# Patient Record
Sex: Male | Born: 1955
Health system: Southern US, Community
[De-identification: ages and names within clinical notes are randomized; demographics above are authoritative.]

## PROBLEM LIST (undated history)

## (undated) ENCOUNTER — Emergency Department (HOSPITAL_COMMUNITY): Payer: Medicare Other | Source: Home / Self Care

## (undated) DIAGNOSIS — J329 Chronic sinusitis, unspecified: Secondary | ICD-10-CM

## (undated) DIAGNOSIS — F419 Anxiety disorder, unspecified: Secondary | ICD-10-CM

## (undated) DIAGNOSIS — K219 Gastro-esophageal reflux disease without esophagitis: Secondary | ICD-10-CM

## (undated) DIAGNOSIS — I219 Acute myocardial infarction, unspecified: Secondary | ICD-10-CM

## (undated) DIAGNOSIS — F329 Major depressive disorder, single episode, unspecified: Secondary | ICD-10-CM

## (undated) DIAGNOSIS — T7840XA Allergy, unspecified, initial encounter: Secondary | ICD-10-CM

## (undated) DIAGNOSIS — G4730 Sleep apnea, unspecified: Secondary | ICD-10-CM

## (undated) DIAGNOSIS — F1910 Other psychoactive substance abuse, uncomplicated: Secondary | ICD-10-CM

## (undated) DIAGNOSIS — F32A Depression, unspecified: Secondary | ICD-10-CM

## (undated) HISTORY — DX: Major depressive disorder, single episode, unspecified: F32.9

## (undated) HISTORY — DX: Gastro-esophageal reflux disease without esophagitis: K21.9

## (undated) HISTORY — DX: Anxiety disorder, unspecified: F41.9

## (undated) HISTORY — DX: Other psychoactive substance abuse, uncomplicated: F19.10

## (undated) HISTORY — DX: Allergy, unspecified, initial encounter: T78.40XA

## (undated) HISTORY — PX: TONSILLECTOMY: SUR1361

## (undated) HISTORY — DX: Chronic sinusitis, unspecified: J32.9

## (undated) HISTORY — DX: Sleep apnea, unspecified: G47.30

## (undated) HISTORY — PX: SINOSCOPY: SHX187

## (undated) HISTORY — PX: NASAL SEPTUM SURGERY: SHX37

## (undated) HISTORY — DX: Depression, unspecified: F32.A

## (undated) HISTORY — DX: Acute myocardial infarction, unspecified: I21.9

## (undated) HISTORY — PX: ADENOIDECTOMY: SUR15

---

## 2013-07-20 ENCOUNTER — Other Ambulatory Visit: Payer: Self-pay | Admitting: *Deleted

## 2013-07-22 ENCOUNTER — Ambulatory Visit (AMBULATORY_SURGERY_CENTER): Payer: Commercial Managed Care - PPO

## 2013-07-22 VITALS — Ht 68.0 in | Wt 179.8 lb

## 2013-07-22 DIAGNOSIS — Z1211 Encounter for screening for malignant neoplasm of colon: Secondary | ICD-10-CM

## 2013-07-22 MED ORDER — MOVIPREP 100 G PO SOLR: 1.0000 | Freq: Once | ORAL | Status: DC

## 2013-07-22 NOTE — Progress Notes (Signed)
No allergies to eggs or soy No past problems with anesthesia No home oxygen No diet/weight loss meds  Has email  Emmi instructions given for colonoscopy 

## 2013-07-28 ENCOUNTER — Encounter: Payer: Self-pay | Admitting: Internal Medicine

## 2013-07-28 ENCOUNTER — Ambulatory Visit (AMBULATORY_SURGERY_CENTER): Payer: Commercial Managed Care - PPO | Admitting: Internal Medicine

## 2013-07-28 VITALS — BP 115/76 | HR 84 | Temp 98.7°F | Resp 16 | Ht 68.0 in | Wt 179.0 lb

## 2013-07-28 DIAGNOSIS — D126 Benign neoplasm of colon, unspecified: Secondary | ICD-10-CM

## 2013-07-28 DIAGNOSIS — D122 Benign neoplasm of ascending colon: Secondary | ICD-10-CM

## 2013-07-28 DIAGNOSIS — Z1211 Encounter for screening for malignant neoplasm of colon: Secondary | ICD-10-CM

## 2013-07-28 MED ORDER — SODIUM CHLORIDE 0.9 % IV SOLN: 500.0000 mL | INTRAVENOUS | Status: DC

## 2013-07-28 NOTE — Op Note (Signed)
[facility]  [address] [place], [id number]   COLONOSCOPY PROCEDURE REPORT  PATIENT: Justin Carroll, Justin Carroll  MR#: [id number] BIRTHDATE: 1955/05/29 , 46  yrs. old GENDER: Male ENDOSCOPIST: Lafayette Dragon, MD REFERRED BY:Dr deborah Cobb PROCEDURE DATE:  07/28/2013 PROCEDURE:   Colonoscopy with snare polypectomy First Screening Colonoscopy - Avg.  risk and is 50 yrs.  old or older Yes.  Prior Negative Screening - Now for repeat screening. N/A  History of Adenoma - Now for follow-up colonoscopy & has been > or = to 3 yrs.  N/A  Polyps Removed Today? Yes. ASA CLASS:   Class II INDICATIONS:Average risk patient for colon cancer. MEDICATIONS: MAC sedation, administered by CRNA and propofol (Diprivan) 250mg  IV  DESCRIPTION OF PROCEDURE:   After the risks benefits and alternatives of the procedure were thoroughly explained, informed consent was obtained.  A digital rectal exam revealed no abnormalities of the rectum.   The [id number]  endoscope was introduced through the anus and advanced to the cecum, which was identified by both the appendix and ileocecal valve. No adverse events experienced.   The quality of the prep was good, using MoviPrep  The instrument was then slowly withdrawn as the colon was fully examined.      COLON FINDINGS: A polypoid shaped semi-pedunculated polyp ranging between 5-74mm in size was found.  A polypectomy was performed with a cold snare.  The resection was complete and the polyp tissue was completely retrieved.   Internal hemorrhoids were found. Retroflexed views revealed no abnormalities. The time to cecum=6 minutes 10 seconds.  Withdrawal time=6 minutes 15 seconds.  The scope was withdrawn and the procedure completed. COMPLICATIONS: There were no complications.  ENDOSCOPIC IMPRESSION: 1.   Semi-pedunculated polyp ranging between 5-84mm in size was foundat hepatic flexure polypectomy was performed with a cold snare  2.   Internal  hemorrhoids  RECOMMENDATIONS: 1.  Await pathology results 2.  no aspirin or anti-inflammatory agents for 2 weeks High-fiber diet Recall colonoscopy pending path report   eSigned:  Lafayette Dragon, MD 07/28/2013 2:05 PM   cc:

## 2013-07-28 NOTE — Patient Instructions (Signed)
YOU HAD AN ENDOSCOPIC PROCEDURE TODAY AT THE [facility]: Refer to the procedure report that was given to you for any specific questions about what was found during the examination.  If the procedure report does not answer your questions, please call your gastroenterologist to clarify.  If you requested that your care partner not be given the details of your procedure findings, then the procedure report has been included in a sealed envelope for you to review at your convenience later.  YOU SHOULD EXPECT: Some feelings of bloating in the abdomen. Passage of more gas than usual.  Walking can help get rid of the air that was put into your GI tract during the procedure and reduce the bloating. If you had a lower endoscopy (such as a colonoscopy or flexible sigmoidoscopy) you may notice spotting of blood in your stool or on the toilet paper. If you underwent a bowel prep for your procedure, then you may not have a normal bowel movement for a few days.  DIET: Your first meal following the procedure should be a light meal and then it is ok to progress to your normal diet.  A half-sandwich or bowl of soup is an example of a good first meal.  Heavy or fried foods are harder to digest and may make you feel nauseous or bloated.  Likewise meals heavy in dairy and vegetables can cause extra gas to form and this can also increase the bloating.  Drink plenty of fluids but you should avoid alcoholic beverages for 24 hours.  ACTIVITY: Your care partner should take you home directly after the procedure.  You should plan to take it easy, moving slowly for the rest of the day.  You can resume normal activity the day after the procedure however you should NOT DRIVE or use heavy machinery for 24 hours (because of the sedation medicines used during the test).    SYMPTOMS TO REPORT IMMEDIATELY: A gastroenterologist can be reached at any hour.  During normal business hours, 8:30 AM to 5:00 PM Monday through Friday,  call [phone].  After hours and on weekends, please call the GI answering service at [phone] who will take a message and have the physician on call contact you.   Following lower endoscopy (colonoscopy or flexible sigmoidoscopy):  Excessive amounts of blood in the stool  Significant tenderness or worsening of abdominal pains  Swelling of the abdomen that is new, acute  Fever of 100F or higher    FOLLOW UP: If any biopsies were taken you will be contacted by phone or by letter within the next 1-3 weeks.  Call your gastroenterologist if you have not heard about the biopsies in 3 weeks.  Our staff will call the home number listed on your records the next business day following your procedure to check on you and address any questions or concerns that you may have at that time regarding the information given to you following your procedure. This is a courtesy call and so if there is no answer at the home number and we have not heard from you through the emergency physician on call, we will assume that you have returned to your regular daily activities without incident.  SIGNATURES/CONFIDENTIALITY: You and/or your care partner have signed paperwork which will be entered into your electronic medical record.  These signatures attest to the fact that that the information above on your After Visit Summary has been reviewed and is understood.  Full responsibility of the confidentiality   of this discharge information lies with you and/or your care-partner.     

## 2013-07-28 NOTE — Progress Notes (Signed)
Called to room to assist during endoscopic procedure.  Patient ID and intended procedure confirmed with present staff. Received instructions for my participation in the procedure from the performing physician.  

## 2013-07-28 NOTE — Progress Notes (Signed)
A/ox3 pleased with MAC, report to Karen RN 

## 2013-07-29 ENCOUNTER — Telehealth: Payer: Self-pay | Admitting: *Deleted

## 2013-07-29 NOTE — Telephone Encounter (Signed)
  Follow up Call-  Call back number 07/28/2013  Post procedure Call Back phone  # [phone]  Permission to leave phone message Yes     Patient questions: Do you have a fever, pain , or abdominal swelling? no Pain Score  0 *  Have you tolerated food without any problems? yes  Have you been able to return to your normal activities? yes  Do you have any questions about your discharge instructions: Diet   no Medications  no Follow up visit  no  Do you have questions or concerns about your Care? no  Actions: * If pain score is 4 or above: No action needed, pain <4.

## 2013-08-02 ENCOUNTER — Encounter: Payer: Self-pay | Admitting: Internal Medicine

## 2018-07-24 ENCOUNTER — Encounter: Payer: Self-pay | Admitting: Gastroenterology

## 2018-07-31 ENCOUNTER — Encounter: Payer: Self-pay | Admitting: Gastroenterology

## 2018-08-17 ENCOUNTER — Other Ambulatory Visit: Payer: Commercial Managed Care - PPO

## 2018-08-17 ENCOUNTER — Other Ambulatory Visit: Payer: Self-pay

## 2018-08-17 ENCOUNTER — Telehealth: Payer: Self-pay | Admitting: Nurse Practitioner

## 2018-08-17 DIAGNOSIS — Z20822 Contact with and (suspected) exposure to covid-19: Secondary | ICD-10-CM

## 2018-08-17 NOTE — Telephone Encounter (Signed)
COVID-19 Testing Request:  Office name - [facility]   Requesting Provider - Elyn Aquas ,NP Contact number - [phone] Fax number -  Reason for request - Exposure   Pt is on his way to the [facility] site on [address] in [place]. Clarise Cruz wanted to place an order just in case. It shows this site doesn't need an order or appt/

## 2018-08-17 NOTE — Telephone Encounter (Signed)
Closing message. Looks like pt has already went to site and orders have been placed.

## 2018-08-21 LAB — NOVEL CORONAVIRUS, NAA: SARS-CoV-2, NAA: NOT DETECTED

## 2018-11-11 ENCOUNTER — Other Ambulatory Visit (INDEPENDENT_AMBULATORY_CARE_PROVIDER_SITE_OTHER): Payer: Self-pay | Admitting: Internal Medicine

## 2018-12-16 ENCOUNTER — Other Ambulatory Visit: Payer: Self-pay

## 2018-12-16 ENCOUNTER — Ambulatory Visit (INDEPENDENT_AMBULATORY_CARE_PROVIDER_SITE_OTHER): Payer: Medicare Other

## 2018-12-16 ENCOUNTER — Ambulatory Visit (INDEPENDENT_AMBULATORY_CARE_PROVIDER_SITE_OTHER): Payer: Commercial Managed Care - PPO

## 2018-12-16 DIAGNOSIS — Z23 Encounter for immunization: Secondary | ICD-10-CM

## 2019-01-27 ENCOUNTER — Other Ambulatory Visit: Payer: Self-pay

## 2019-01-27 ENCOUNTER — Ambulatory Visit (INDEPENDENT_AMBULATORY_CARE_PROVIDER_SITE_OTHER): Payer: Medicare Other | Admitting: Internal Medicine

## 2019-01-27 ENCOUNTER — Encounter (INDEPENDENT_AMBULATORY_CARE_PROVIDER_SITE_OTHER): Payer: Self-pay | Admitting: Internal Medicine

## 2019-01-27 ENCOUNTER — Encounter (INDEPENDENT_AMBULATORY_CARE_PROVIDER_SITE_OTHER): Payer: Self-pay

## 2019-01-27 VITALS — BP 120/80 | HR 72 | Ht 66.0 in | Wt 191.2 lb

## 2019-01-27 DIAGNOSIS — W19XXXA Unspecified fall, initial encounter: Secondary | ICD-10-CM

## 2019-01-27 DIAGNOSIS — R5381 Other malaise: Secondary | ICD-10-CM | POA: Diagnosis not present

## 2019-01-27 DIAGNOSIS — R6882 Decreased libido: Secondary | ICD-10-CM

## 2019-01-27 DIAGNOSIS — R5383 Other fatigue: Secondary | ICD-10-CM

## 2019-01-27 DIAGNOSIS — R413 Other amnesia: Secondary | ICD-10-CM

## 2019-01-27 DIAGNOSIS — R0981 Nasal congestion: Secondary | ICD-10-CM

## 2019-01-27 DIAGNOSIS — R404 Transient alteration of awareness: Secondary | ICD-10-CM

## 2019-01-27 NOTE — Progress Notes (Signed)
Metrics: Intervention Frequency ACO  Documented Smoking Status Yearly  Screened one or more times in 24 months  Cessation Counseling or  Active cessation medication Past 24 months  Past 24 months   Guideline developer: UpToDate (See UpToDate for funding source) Date Released: 2014       Wellness Office Visit  Subjective:  Patient ID: Justin Carroll, male    DOB: [birth year]  Age: 63 y.o. MRN: [id number]  CC: This man comes in for an acute visit with symptoms of falls, altered mental status and memory loss. HPI According to his wife, he has had 4 falls in the last 1 month and in the last 6 weeks, he has had worsening memory problems and sometimes appears to be confused.  He also has become somewhat irritable according to his wife and "grouchy". He describes decreased libido also. The patient himself has not lost consciousness with these falls and they are very sudden but he does note that he is going to fall but denies lightheadedness or dizziness or vertigo symptoms.  He says he has not been clumsy whilst walking.  His speech has not changed. He also appears to have sinus congestion and allergies for several months and he would like referral to ENT.  He does take Xyzal over-the-counter for allergies. Past Medical History:  Diagnosis Date  . Depression       Family History  Problem Relation Age of Onset  . Colon cancer Neg Hx   . Pancreatic cancer Neg Hx   . Rectal cancer Neg Hx   . 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   Social History   Tobacco Use  . Smoking status: Former Research scientist (life sciences)  . Smokeless tobacco: Never Used  Substance Use Topics  . Alcohol use: No    Current Meds  Medication Sig  . buPROPion (WELLBUTRIN XL) 300 MG 24 hr tablet Take 1 tablet by mouth once daily  . Cholecalciferol (VITAMIN D-3) 25 MCG (1000 UT) CAPS Take 2 capsules by mouth daily.  Marland Kitchen levocetirizine (XYZAL) 5 MG tablet Take 5 mg by  mouth every evening.  . pantoprazole (PROTONIX) 40 MG tablet Take 1 tablet by mouth once daily  . tamsulosin (FLOMAX) 0.4 MG CAPS capsule Take 0.4 mg by mouth daily after supper.  . traZODone (DESYREL) 100 MG tablet TAKE 1 TABLET BY MOUTH ONCE DAILY AT NIGHT      Objective:   Today's Vitals: BP 120/80   Pulse 72   Ht _0  (1.676 m)   Wt 191 lb 3.2 oz (86.7 kg)   BMI 30.86 kg/m  Vitals with BMI 01/27/2019 07/28/2013 07/28/2013  Height _1  - -  Weight 191 lbs 3 oz - -  BMI 70.96 - -  Systolic [phone]  Diastolic 80 76 65  Pulse 72 84 82     Physical Exam   He looks systemically well.  His gait is normal.  He has normal arm movement.  He has no real symptoms of extrapyramidal syndrome.  He does not have a pronator drift.  Finger-to-nose test is unsteady towards the end of the movement.  Possibly he has some facial asymmetry with right facial droop.  I think he also has a visual field defect on the left side upper quadrant.  Strength in all his limbs appears to be normal.  He is alert and orientated in time, space and person.    Assessment  1. Fall, initial encounter   2. Malaise and fatigue   3. Decreased libido   4. Memory loss   5. Transient alteration of awareness   6. Sinus congestion       Tests ordered Orders Placed This Encounter  Procedures  . MR Brain Wo Contrast  . CBC  . CMP with eGFR([facility])  . T3, Free  . TSH  . Testosterone Total,Free,Bio, Males  . B12 and Folate Panel  . Ambulatory referral to ENT     Plan: 1. Blood work is ordered as above. 2. I will schedule MRI brain scan for his symptoms of falls, memory loss and degree of altered mental status on occasions.  He does have some abnormal physical findings also. 3. I will refer him to ENT for his sinus problems. 4. Further recommendations will depend once I get all these results back.   No orders of the defined types were placed in this encounter.   Doree Albee, MD

## 2019-01-28 LAB — COMPLETE METABOLIC PANEL WITH GFR
AG Ratio: 1.8 (calc) (ref 1.0–2.5)
ALT: 15 U/L (ref 9–46)
AST: 18 U/L (ref 10–35)
Albumin: 4.6 g/dL (ref 3.6–5.1)
Alkaline phosphatase (APISO): 87 U/L (ref 35–144)
BUN/Creatinine Ratio: 15 (calc) (ref 6–22)
BUN: 20 mg/dL (ref 7–25)
CO2: 21 mmol/L (ref 20–32)
Calcium: 9.5 mg/dL (ref 8.6–10.3)
Chloride: 105 mmol/L (ref 98–110)
Creat: 1.33 mg/dL — ABNORMAL HIGH (ref 0.70–1.25)
GFR, Est African American: 65 mL/min/{1.73_m2} (ref >=60)
GFR, Est Non African American: 56 mL/min/{1.73_m2} — ABNORMAL LOW (ref >=60)
Globulin: 2.6 g/dL (calc) (ref 1.9–3.7)
Glucose, Bld: 108 mg/dL — ABNORMAL HIGH (ref 65–99)
Potassium: 5 mmol/L (ref 3.5–5.3)
Sodium: 140 mmol/L (ref 135–146)
Total Bilirubin: 0.4 mg/dL (ref 0.2–1.2)
Total Protein: 7.2 g/dL (ref 6.1–8.1)

## 2019-01-28 LAB — TESTOSTERONE TOTAL,FREE,BIO, MALES
Albumin: 4.6 g/dL (ref 3.6–5.1)
Sex Hormone Binding: 35 nmol/L (ref 22–77)
Testosterone, Bioavailable: 86.8 ng/dL — ABNORMAL LOW (ref >=110.0)
Testosterone, Free: 41.4 pg/mL — ABNORMAL LOW (ref 46.0–224.0)
Testosterone: 340 ng/dL (ref 250–827)

## 2019-01-28 LAB — CBC
HCT: 47.4 % (ref 38.5–50.0)
Hemoglobin: 16 g/dL (ref 13.2–17.1)
MCH: 31 pg (ref 27.0–33.0)
MCHC: 33.8 g/dL (ref 32.0–36.0)
MCV: 91.9 fL (ref 80.0–100.0)
MPV: 9.9 fL (ref 7.5–12.5)
Platelets: 212 10*3/uL (ref 140–400)
RBC: 5.16 10*6/uL (ref 4.20–5.80)
RDW: 12.3 % (ref 11.0–15.0)
WBC: 4.5 10*3/uL (ref 3.8–10.8)

## 2019-01-28 LAB — B12 AND FOLATE PANEL
Folate: 10.7 ng/mL
Vitamin B-12: 460 pg/mL (ref 200–1100)

## 2019-01-28 LAB — TSH: TSH: 0.87 mIU/L (ref 0.40–4.50)

## 2019-01-28 LAB — T3, FREE: T3, Free: 3.3 pg/mL (ref 2.3–4.2)

## 2019-02-04 ENCOUNTER — Ambulatory Visit (INDEPENDENT_AMBULATORY_CARE_PROVIDER_SITE_OTHER): Payer: Medicare Other | Admitting: Otolaryngology

## 2019-02-04 ENCOUNTER — Encounter (INDEPENDENT_AMBULATORY_CARE_PROVIDER_SITE_OTHER): Payer: Self-pay | Admitting: Otolaryngology

## 2019-02-04 ENCOUNTER — Other Ambulatory Visit: Payer: Self-pay

## 2019-02-04 VITALS — Temp 97.9°F

## 2019-02-04 DIAGNOSIS — J342 Deviated nasal septum: Secondary | ICD-10-CM | POA: Diagnosis not present

## 2019-02-04 DIAGNOSIS — K219 Gastro-esophageal reflux disease without esophagitis: Secondary | ICD-10-CM | POA: Diagnosis not present

## 2019-02-04 DIAGNOSIS — J310 Chronic rhinitis: Secondary | ICD-10-CM | POA: Diagnosis not present

## 2019-02-04 NOTE — Progress Notes (Signed)
HPI: Justin Carroll is a 63 y.o. male who presents for evaluation of chronic throat clearing and postnasal drainage.  He has some nasal congestion which is worse on the left side.  He presently has had some neurologic problems and is getting an MRI scan of his head next week.  But he presents today mostly because of chronic throat clearing which has been getting worse over the past few years.  He works as an Forensic psychologist. He is presently using Flonase and Xyzal. He also has history of reflux problems and is on Protonix at night. The drainage is generally clear when he blows his nose.  Denies any yellow-green discharge.  No real paranasal pain or discomfort  Past Medical History:  Diagnosis Date  . Depression    Past Surgical History:  Procedure Laterality Date  . TONSILLECTOMY     age 47   Social History   Socioeconomic History  . Marital status: Married    Spouse name: Not on file  . Number of children: Not on file  . Years of education: Not on file  . Highest education level: Not on file  Occupational History  . Not on file  Tobacco Use  . Smoking status: Former Smoker    Packs/day: 1.50    Years: 10.00    Pack years: 15.00    Start date: 1995    Quit date: 2015    Years since quitting: 6.0  . Smokeless tobacco: Never Used  Substance and Sexual Activity  . Alcohol use: No  . Drug use: No  . Sexual activity: Not on file  Other Topics Concern  . 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   Social Determinants of Health   Financial Resource Strain:   . Difficulty of Paying Living Expenses: Not on file  Food Insecurity:   . Worried About Charity fundraiser in the Last Year: Not on file  . Ran Out of Food in the Last Year: Not on file  Transportation Needs:   . Lack of Transportation (Medical): Not on file  . Lack of Transportation (Non-Medical): Not on file  Physical Activity:   . Days of Exercise per Week: Not on file  . Minutes of Exercise  per Session: Not on file  Stress:   . Feeling of Stress : Not on file  Social Connections:   . Frequency of Communication with Friends and Family: Not on file  . Frequency of Social Gatherings with Friends and Family: Not on file  . Attends Religious Services: Not on file  . Active Member of Clubs or Organizations: Not on file  . Attends Archivist Meetings: Not on file  . Marital Status: Not on file   Family History  Problem Relation Age of Onset  . Colon cancer Neg Hx   . Pancreatic cancer Neg Hx   . Rectal cancer Neg Hx   . Stomach cancer Neg Hx    No Known Allergies Prior to Admission medications   Medication Sig Start Date End Date Taking? Authorizing Provider  buPROPion (WELLBUTRIN XL) 300 MG 24 hr tablet Take 1 tablet by mouth once daily 11/12/18 01/27/19  Hurshel Party C, MD  Cholecalciferol (VITAMIN D-3) 25 MCG (1000 UT) CAPS Take 2 capsules by mouth daily.    [provider]  levocetirizine (XYZAL) 5 MG tablet Take 5 mg by mouth every evening.    [provider]  pantoprazole (PROTONIX) 40 MG tablet Take 1  tablet by mouth once daily 11/12/18 01/27/19  Hurshel Party C, MD  tamsulosin (FLOMAX) 0.4 MG CAPS capsule Take 0.4 mg by mouth daily after supper.    [provider]  traZODone (DESYREL) 100 MG tablet TAKE 1 TABLET BY MOUTH ONCE DAILY AT NIGHT 11/12/18 01/27/19  Hurshel Party C, MD  buPROPion (WELLBUTRIN XL) 300 MG 24 hr tablet Take 300 mg by mouth daily.    [provider]  traZODone (DESYREL) 100 MG tablet Take 100 mg by mouth at bedtime.    [provider]     Positive ROS: Otherwise negative  All other systems have been reviewed and were otherwise negative with the exception of those mentioned in the HPI and as above.  Physical Exam: Constitutional: Alert, well-appearing, no acute distress Ears: External ears without lesions or tenderness. Ear canals are clear bilaterally with intact, clear TMs.  Nasal:  External nose without lesions. Septum is deviated to the left..  Mild rhinitis with clear mucus discharge clear nasal passages.  Both middle meatus regions are clear with no significant drainage from the middle meatus.  Mucus within the nasal cavity is clear.  No polyps noted. Oral: Lips and gums without lesions. Tongue and palate mucosa without lesions. Posterior oropharynx clear..  Patient is status post tonsillectomy Fiberoptic laryngoscopy through the right nostril revealed a clear nasopharynx.  Base of tongue vallecula and epiglottis were normal.  Vocal cords were clear bilaterally with normal vocal cord mobility.  He had minimal supraglottic mucus.  He had mild arytenoid edema which may be indicative of reflux.. Neck: No palpable adenopathy or masses Respiratory: Breathing comfortably  Skin: No facial/neck lesions or rash noted.  Laryngoscopy  Date/Time: 02/04/2019 5:01 PM Performed by: Rozetta Nunnery, MD Authorized by: Rozetta Nunnery, MD   Consent:    Consent obtained:  Verbal   Consent given by:  Patient   Risks discussed:  Pain Procedure details:    Indications: sino-nasal symptoms     Scope location: right nare   Septum:    Deviation: deviated to the left     Severity of deviation: intermediate   Sinus:    Right middle meatus: normal     Right nasopharynx: normal   Mouth:    Oropharynx: normal     Vallecula: normal     Base of tongue: normal     Epiglottis: normal   Throat:    Pyriform sinus: normal     True vocal cords: normal   Comments:     Clear upper airway examination with minimal supraglottic mucus.  Mild arytenoid edema may be indicative of reflux symptoms.    Assessment: Chronic rhinitis Laryngeal pharyngeal reflux may be contributing to chronic throat clearing. Moderate septal deviation to the left contributing to some nasal obstruction  Plan: Recommended regular use of nasal steroid spray at night and prescribed Nasacort 2 sprays each  nostril at night to compare to the Flonase. He will continue with Xyzal which he is presently taking. Also suggested using omeprazole 40 mg daily before dinner and compare this to the Protonix which he is presently taking for the next month. I will plan on reviewing the MRI scan of his head that is scheduled next week to look more closely at the sinuses.  Radene Journey, MD

## 2019-02-10 ENCOUNTER — Ambulatory Visit (HOSPITAL_COMMUNITY)
Admission: RE | Admit: 2019-02-10 | Discharge: 2019-02-10 | Disposition: A | Discharge status: Home / Self Care | Payer: Medicare Other | Source: Ambulatory Visit | Attending: Internal Medicine | Admitting: Internal Medicine

## 2019-02-10 ENCOUNTER — Other Ambulatory Visit: Payer: Self-pay

## 2019-02-10 DIAGNOSIS — R413 Other amnesia: Secondary | ICD-10-CM | POA: Diagnosis not present

## 2019-02-10 DIAGNOSIS — W19XXXA Unspecified fall, initial encounter: Secondary | ICD-10-CM | POA: Insufficient documentation

## 2019-02-10 DIAGNOSIS — R404 Transient alteration of awareness: Secondary | ICD-10-CM | POA: Diagnosis not present

## 2019-02-11 ENCOUNTER — Other Ambulatory Visit (INDEPENDENT_AMBULATORY_CARE_PROVIDER_SITE_OTHER): Payer: Self-pay | Admitting: Internal Medicine

## 2019-02-11 MED ORDER — TESTOSTERONE CYPIONATE 200 MG/ML IM SOLN: 100.0000 mg | INTRAMUSCULAR | 0 refills | Status: DC

## 2019-02-15 ENCOUNTER — Other Ambulatory Visit (INDEPENDENT_AMBULATORY_CARE_PROVIDER_SITE_OTHER): Payer: Self-pay

## 2019-02-15 DIAGNOSIS — R413 Other amnesia: Secondary | ICD-10-CM

## 2019-02-15 DIAGNOSIS — W19XXXA Unspecified fall, initial encounter: Secondary | ICD-10-CM

## 2019-02-15 DIAGNOSIS — R404 Transient alteration of awareness: Secondary | ICD-10-CM

## 2019-02-18 ENCOUNTER — Ambulatory Visit (INDEPENDENT_AMBULATORY_CARE_PROVIDER_SITE_OTHER): Payer: Medicare Other | Admitting: Neurology

## 2019-02-18 ENCOUNTER — Other Ambulatory Visit: Payer: Self-pay

## 2019-02-18 ENCOUNTER — Encounter: Payer: Self-pay | Admitting: Neurology

## 2019-02-18 VITALS — BP 128/86 | HR 84 | Temp 97.6°F | Ht 67.0 in | Wt 189.0 lb

## 2019-02-18 DIAGNOSIS — H519 Unspecified disorder of binocular movement: Secondary | ICD-10-CM

## 2019-02-18 DIAGNOSIS — W19XXXA Unspecified fall, initial encounter: Secondary | ICD-10-CM

## 2019-02-18 DIAGNOSIS — R413 Other amnesia: Secondary | ICD-10-CM | POA: Diagnosis not present

## 2019-02-18 DIAGNOSIS — F0280 Dementia in other diseases classified elsewhere without behavioral disturbance: Secondary | ICD-10-CM | POA: Insufficient documentation

## 2019-02-18 DIAGNOSIS — G3109 Other frontotemporal dementia: Secondary | ICD-10-CM | POA: Diagnosis not present

## 2019-02-18 DIAGNOSIS — G231 Progressive supranuclear ophthalmoplegia [Steele-Richardson-Olszewski]: Secondary | ICD-10-CM

## 2019-02-18 NOTE — Patient Instructions (Signed)

## 2019-02-18 NOTE — Progress Notes (Addendum)
Provider:  Larey Seat, M D  Referring Provider: Doree Albee, MD Primary Care Physician:  Doree Albee, MD  Chief Complaint  Patient presents with  . New Patient (Initial Visit) RN intake note:     pt with wife, rm 43. pt states that over the last year he has progressively noticed changes in memory and confusion concerns. wife in room states that he has impaired concentration, is easily distracted  and loses keys., gets lost, has lost through trail of thought in conversation   . 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 His office staff noted him to be disorganized and forgetfull, losing paperwork, misfiling, etc. His wife and he have raised 7 children ( 2 foster children) .  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    . Fall    pt also mentions having frequent falls, there is no dizziness or anything that leads up to the falls he just loses sense of balance.     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 Anastasio Champion for  evaluation of Memory.  Dr. Anastasio Champion also added that the patient had come somewhat more irritable at times and his wife used to term grouchy. He has not lost awareness or consciousness with any of his falls they don't seem to be syncope or seizures but they're sudden. He denies any lightheadedness or vertigo. He has not felt clumsy whilst walking and his speech has not changed he has also asked for an ENT referral as he has frequent sinus congestion. He has seen Dr Lucia Gaskins, he will do a septal deviation repair.   The family history is positive for a father with coronary artery disease, pulmonary disease/ COPD , who was heavy smoker. A brother was post mortem diagnosed with vascular dementia.    There may have been a progression slowly over the last 3 years or so of  memory loss being noticeable but over the last 6 months the family has noted a significant progression. Especially with getting lost on a familiar road on a routine drive. He has a completely disorganized office, he has misfiled papers, lost papers. His Network engineer and his wife have kept him from taking charts home.   He has impaired depth perception, has trouble parking a larger vehicle, but stated he has no trouble reading.  He reportedly has been unimpaired when it comes to accounting, but this is mostly done by his wife.   He has very good and very bad days, is usually better in the morning when it comes to cognitive performance. He has lost words, skips sometimes words, non-fluent - he makes more small talk than actual conversation.  He denies nocturnal activity, acting out dreams, but is a restless sleeper.   An MRI brain without contrast did not reveal abnormalities. [name] ordered.  Part of our exam today was a Montreal cognitive assessment , abbreviated MOCA, and Mr. Nickum scored 24 out of 30 points , placing him just at the borderline between a mild cognitive impairment and a beginning dementia.  There was impairment with visual-spatial problem-solving to copy a cube was difficult he could name three animals could repeat words and a series of letters he was very good  for subtraction and mathematics he remembered 3 out of 5 delay words. His orientation to place and time was completely intact.  Montreal Cognitive Assessment  02/18/2019  Visuospatial/ Executive (0/5) 3  Naming (0/3) 3  Attention: Read list of digits (0/2) 2  Attention: Read list of letters (0/1) 1  Attention: Serial 7 subtraction starting at 100 (0/3) 3  Language: Repeat phrase (0/2) 2  Language : Fluency (0/1) 1  Abstraction (0/2) 2  Delayed Recall (0/5) 3  Orientation (0/6) 6  Total 26     Review of Systems: Out of a complete 14 system review, the patient complains of only the following symptoms, and all  other reviewed systems are negative. Confusion, disorganized, forgetful,  personality changes , sudden falls.  Loss of libido.     Social History   Socioeconomic History  . Marital status: Married    Spouse name: Not on file  . Number of children: Not on file  . Years of education: Not on file  . Highest education level: Not on file  Occupational History  . Not on file  Tobacco Use  . Smoking status: Former Smoker    Packs/day: 1.50    Years: 10.00    Pack years: 15.00    Start date: 1995    Quit date: 2015    Years since quitting: 6.0  . Smokeless tobacco: Never Used  Substance and Sexual Activity  . Alcohol use: No  . Drug use: No  . Sexual activity: Not on file  Other Topics Concern  . 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   Social Determinants of Health   Financial Resource Strain:   . Difficulty of Paying Living Expenses: Not on file  Food Insecurity:   . Worried About Charity fundraiser in the Last Year: Not on file  . Ran Out of Food in the Last Year: Not on file  Transportation Needs:   . Lack of Transportation (Medical): Not on file  . Lack of Transportation (Non-Medical): Not on file  Physical Activity:   . Days of Exercise per Week: Not on file  . Minutes of Exercise per Session: Not on file  Stress:   . Feeling of Stress : Not on file  Social Connections:   . Frequency of Communication with Friends and Family: Not on file  . Frequency of Social Gatherings with Friends and Family: Not on file  . Attends Religious Services: Not on file  . Active Member of Clubs or Organizations: Not on file  . Attends Archivist Meetings: Not on file  . Marital Status: Not on file  Intimate Partner Violence:   . Fear of Current or Ex-Partner: Not on file  . Emotionally Abused: Not on file  . Physically Abused: Not on file  . Sexually Abused: Not on file    Family History  Problem Relation Age of Onset  . Colon  cancer Neg Hx   . Pancreatic cancer Neg Hx   . Rectal cancer Neg Hx   . Stomach cancer Neg Hx     Past Medical History:  Diagnosis Date  . Depression     Past Surgical History:  Procedure Laterality Date  . TONSILLECTOMY     age 59    Current Outpatient Medications  Medication Sig Dispense Refill  . buPROPion (WELLBUTRIN XL) 300 MG 24 hr tablet Take 300 mg by mouth daily.    . Cholecalciferol (VITAMIN D-3) 25  MCG (1000 UT) CAPS Take 2 capsules by mouth daily.    Marland Kitchen levocetirizine (XYZAL) 5 MG tablet Take 5 mg by mouth every evening.    . pantoprazole (PROTONIX) 40 MG tablet Take 40 mg by mouth daily.    . tamsulosin (FLOMAX) 0.4 MG CAPS capsule Take 0.4 mg by mouth daily after supper.    . testosterone cypionate (DEPO-TESTOSTERONE) 200 MG/ML injection Inject 0.5 mLs (100 mg total) into the muscle every 7 (seven) days. 10 mL 0  . traZODone (DESYREL) 100 MG tablet Take 100 mg by mouth at bedtime.     No current facility-administered medications for this visit.    Allergies as of 02/18/2019  . (No Known Allergies)    Vitals: BP 128/86   Pulse 84   Temp 97.6 F (36.4 C)   Ht 5\' 7"  (1.702 m)   Wt 189 lb (85.7 kg)   BMI 29.60 kg/m  Last Weight:  Wt Readings from Last 1 Encounters:  02/18/19 189 lb (85.7 kg)   Last Height:   Ht Readings from Last 1 Encounters:  02/18/19 5\' 7"  (1.702 m)    Physical exam:  General: The patient is awake, alert and appears not in acute distress. The patient is well groomed. Head: Normocephalic, atraumatic. Neck is supple. Mallampati 3, neck circumference:15.5 Cardiovascular:  Regular rate and rhythm, without  murmurs or carotid bruit, and without distended neck veins. Respiratory: Lungs are clear to auscultation. Skin:  Without evidence of edema, or rash Trunk: BMI 29.60  Neurologic exam : The patient is awake and alert, oriented to place and time.   Memory subjective described as impaired . There is a significantly reduced attention  span & concentration ability. Speech is non-fluent without dysarthria, dysphonia -but beginning aphasia.  Mood and affect are aloof. He appears unable to focus, needs reminders to follow 2 step commands.   Cranial nerves: Pupils are equally round, large  and remain reactive to light.  Funduscopic exam without evidence of pallor or edema.  Extraocular movements in vertical and horizontal planes impaired, he can not follow a moving object horizontally or vertically ( Visual fields by finger perimetry remains intact). When asked to direct his gaze to the sides or upwards, downwards, he can do so.  Hearing to finger rub intact.  Facial sensation intact to fine touch. Facial motor strength is symmetric and tongue and uvula move midline. Tongue protrusion into either cheek is normal. Shoulder shrug is normal.   Motor exam:  Normal tone , able to relax completely- muscle bulk is equal left and right-  and symmetric strength in all extremities is found.  Sensory: to fine touch, pinprick and vibration were intact /normal.  Coordination: Rapid alternating movements in the fingers/hands were normal. Finger-to-nose maneuver  normal without evidence of ataxia, dysmetria or tremor.  Gait and station: Patient walks without assistive device and is able unassisted to raise form a seated position, and  to climb up to the exam table. Strength within normal limits. Stance is stable and normal. He turned with 3 steps.  Romberg testing is negative.   Deep tendon reflexes: in the upper and lower extremities are symmetric and intact. Babinski maneuver response is downgoing.    I reviewed Mr. Gordy Gnagey medical record, Dr. Lanice Shirts notes and the MRI was reviewed in person and explained to the patient I do not see any abnormality for the frontal lobe or brainstem which could correlate with his eye movement problem.  I wonder if he is  not always able to see the ground well because of an eye movement restriction  forcing him to bend forwards and this may be precipitating any falls by forward leaning.  We can see this with progressive supranuclear palsy patients, but his memory impairment and personality changes are more likely associated also with frontotemporal dementia.     Assessment:  After physical and neurologic examination, review of laboratory studies, imaging, neurophysiology testing and pre-existing records, assessment is that of :  Suspect beginning frontotemporal dementia/ or progressive supranuclear palsy. I do not think this is an inflammatory or infectious process as it has been slowly developing and there is no evidence or history of a malignancy. No recent drug or alcohol use. Lewy body was considered but there is no visual hallucintaion, no increased muscle tone and no REM BD.      Plan:  Treatment plan and additional workup :  To further distinguish this disorder I would need a visual field / oculomotor exam through ophthalmology,  I will order a metabolic MRI also known as a PET scan-  At this time I do not see any necessity for a lumbar puncture.   Rv with me in 2-3 month.  Asencion Partridge Yamil Oelke MD 02/18/2019

## 2019-02-19 LAB — COMPREHENSIVE METABOLIC PANEL
ALT: 17 IU/L (ref 0–44)
AST: 19 IU/L (ref 0–40)
Albumin/Globulin Ratio: 2 (ref 1.2–2.2)
Albumin: 4.7 g/dL (ref 3.8–4.8)
Alkaline Phosphatase: 106 IU/L (ref 39–117)
BUN/Creatinine Ratio: 10 (ref 10–24)
BUN: 12 mg/dL (ref 8–27)
Bilirubin Total: 0.3 mg/dL (ref 0.0–1.2)
CO2: 22 mmol/L (ref 20–29)
Calcium: 9.8 mg/dL (ref 8.6–10.2)
Chloride: 101 mmol/L (ref 96–106)
Creatinine, Ser: 1.19 mg/dL (ref 0.76–1.27)
GFR calc Af Amer: 74 mL/min/{1.73_m2} (ref >59)
GFR calc non Af Amer: 64 mL/min/{1.73_m2} (ref >59)
Globulin, Total: 2.4 g/dL (ref 1.5–4.5)
Glucose: 80 mg/dL (ref 65–99)
Potassium: 5 mmol/L (ref 3.5–5.2)
Sodium: 139 mmol/L (ref 134–144)
Total Protein: 7.1 g/dL (ref 6.0–8.5)

## 2019-02-19 NOTE — Addendum Note (Signed)
Addended by: Larey Seat on: 02/19/2019 11:22 AM   Modules accepted: Orders

## 2019-02-22 ENCOUNTER — Encounter: Payer: Self-pay | Admitting: Neurology

## 2019-02-22 NOTE — Addendum Note (Signed)
Addended by: Darleen Crocker on: 02/22/2019 08:53 AM   Modules accepted: Orders

## 2019-02-26 ENCOUNTER — Encounter: Payer: Self-pay | Admitting: Neurology

## 2019-02-26 ENCOUNTER — Ambulatory Visit (INDEPENDENT_AMBULATORY_CARE_PROVIDER_SITE_OTHER): Payer: Medicare Other | Admitting: Otolaryngology

## 2019-03-02 ENCOUNTER — Other Ambulatory Visit: Payer: Self-pay | Admitting: Neurology

## 2019-03-02 ENCOUNTER — Ambulatory Visit (HOSPITAL_COMMUNITY)
Admission: RE | Admit: 2019-03-02 | Discharge: 2019-03-02 | Disposition: A | Discharge status: Home / Self Care | Payer: Medicare Other | Source: Ambulatory Visit | Attending: Neurology | Admitting: Neurology

## 2019-03-02 ENCOUNTER — Other Ambulatory Visit: Payer: Self-pay

## 2019-03-02 ENCOUNTER — Encounter: Payer: Self-pay | Admitting: Neurology

## 2019-03-02 DIAGNOSIS — G3109 Other frontotemporal dementia: Secondary | ICD-10-CM | POA: Diagnosis present

## 2019-03-02 DIAGNOSIS — F0280 Dementia in other diseases classified elsewhere without behavioral disturbance: Secondary | ICD-10-CM

## 2019-03-02 DIAGNOSIS — R413 Other amnesia: Secondary | ICD-10-CM

## 2019-03-02 DIAGNOSIS — H519 Unspecified disorder of binocular movement: Secondary | ICD-10-CM

## 2019-03-02 LAB — GLUCOSE, CAPILLARY: Glucose-Capillary: 96 mg/dL (ref 70–99)

## 2019-03-02 MED ORDER — DONEPEZIL HCL 10 MG PO TABS: ORAL_TABLET | ORAL | 0 refills | Status: DC

## 2019-03-02 MED ORDER — FLUDEOXYGLUCOSE F - 18 (FDG) INJECTION
9.9700 | Freq: Once | INTRAVENOUS | Status: AC | PRN
  Administered 2019-03-02: 9.97 via INTRAVENOUS

## 2019-03-02 NOTE — Progress Notes (Signed)
Dear Sabra Heck,  Dear Mrs. Justin Carroll:  I certainly hoped to see something guiding [place] in the diagnosis. This is a good result, yet it is "normal" when the patient clearly struggles with confusion , forgetfulness and rapid cognitive decline. In the meantime, I will refer him to neuropsychological testing and ophthalmology for eye-movements and visual field test.  We will use Aricept.

## 2019-03-02 NOTE — Progress Notes (Signed)
Suprisingly normal PET scan brain

## 2019-03-02 NOTE — Progress Notes (Signed)
Normal glucose level in preparation for PET scan

## 2019-03-03 ENCOUNTER — Encounter: Payer: Self-pay | Admitting: Psychology

## 2019-03-05 ENCOUNTER — Encounter (INDEPENDENT_AMBULATORY_CARE_PROVIDER_SITE_OTHER): Payer: Self-pay | Admitting: Otolaryngology

## 2019-03-05 ENCOUNTER — Ambulatory Visit (INDEPENDENT_AMBULATORY_CARE_PROVIDER_SITE_OTHER): Payer: Medicare Other | Admitting: Otolaryngology

## 2019-03-05 ENCOUNTER — Other Ambulatory Visit: Payer: Self-pay

## 2019-03-05 VITALS — Temp 97.7°F

## 2019-03-05 DIAGNOSIS — J343 Hypertrophy of nasal turbinates: Secondary | ICD-10-CM

## 2019-03-05 DIAGNOSIS — J342 Deviated nasal septum: Secondary | ICD-10-CM | POA: Diagnosis not present

## 2019-03-05 DIAGNOSIS — J310 Chronic rhinitis: Secondary | ICD-10-CM | POA: Diagnosis not present

## 2019-03-05 NOTE — Progress Notes (Signed)
HPI: Justin Carroll is a 64 y.o. male who returns today for evaluation of chronic sinus issues and chronic nasal obstruction.  He recently had a MRI scan of his head.  I had a chance to review this and this demonstrated relatively clear paranasal sinuses but he had a lot of swelling within the nasal cavity along the turbinates and a deviation of the septum.  He complains of chronic postnasal drainage and chronic throat clearing.  He also has difficulty breathing through his nose despite use of nasal steroid spray.  He is interested in surgical intervention to improve his nasal breathing and decrease postnasal drainage. He has tried Production assistant, radio.  He also takes Protonix..  Past Medical History:  Diagnosis Date  . Depression    Past Surgical History:  Procedure Laterality Date  . TONSILLECTOMY     age 74   Social History   Socioeconomic History  . Marital status: Married    Spouse name: Not on file  . Number of children: Not on file  . Years of education: Not on file  . Highest education level: Not on file  Occupational History  . Not on file  Tobacco Use  . Smoking status: Former Smoker    Packs/day: 1.50    Years: 10.00    Pack years: 15.00    Start date: 1995    Quit date: 2015    Years since quitting: 6.0  . Smokeless tobacco: Never Used  Substance and Sexual Activity  . Alcohol use: No  . Drug use: No  . Sexual activity: Not on file  Other Topics Concern  . 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   Social Determinants of Health   Financial Resource Strain:   . Difficulty of Paying Living Expenses: Not on file  Food Insecurity:   . Worried About Charity fundraiser in the Last Year: Not on file  . Ran Out of Food in the Last Year: Not on file  Transportation Needs:   . Lack of Transportation (Medical): Not on file  . Lack of Transportation (Non-Medical): Not on file  Physical Activity:   . Days of Exercise per Week: Not on file  .  Minutes of Exercise per Session: Not on file  Stress:   . Feeling of Stress : Not on file  Social Connections:   . Frequency of Communication with Friends and Family: Not on file  . Frequency of Social Gatherings with Friends and Family: Not on file  . Attends Religious Services: Not on file  . Active Member of Clubs or Organizations: Not on file  . Attends Archivist Meetings: Not on file  . Marital Status: Not on file   Family History  Problem Relation Age of Onset  . Colon cancer Neg Hx   . Pancreatic cancer Neg Hx   . Rectal cancer Neg Hx   . Stomach cancer Neg Hx    No Known Allergies Prior to Admission medications   Medication Sig Start Date End Date Taking? Authorizing Provider  buPROPion (WELLBUTRIN XL) 300 MG 24 hr tablet Take 300 mg by mouth daily.   Yes [provider]  Cholecalciferol (VITAMIN D-3) 25 MCG (1000 UT) CAPS Take 2 capsules by mouth daily.   Yes [provider]  donepezil (ARICEPT) 10 MG tablet Start by taking 1/2 tablet (5mg  ) once a day for 30 days. 03/02/19  Yes Dohmeier, Asencion Partridge, MD  levocetirizine (XYZAL) 5 MG  tablet Take 5 mg by mouth every evening.   Yes [provider]  pantoprazole (PROTONIX) 40 MG tablet Take 40 mg by mouth daily.   Yes [provider]  tamsulosin (FLOMAX) 0.4 MG CAPS capsule Take 0.4 mg by mouth daily after supper.   Yes [provider]  testosterone cypionate (DEPO-TESTOSTERONE) 200 MG/ML injection Inject 0.5 mLs (100 mg total) into the muscle every 7 (seven) days. 02/11/19  Yes Gosrani, Nimish C, MD  traZODone (DESYREL) 100 MG tablet Take 100 mg by mouth at bedtime.   Yes [provider]  buPROPion (WELLBUTRIN XL) 300 MG 24 hr tablet Take 300 mg by mouth daily.    [provider]  traZODone (DESYREL) 100 MG tablet Take 100 mg by mouth at bedtime.    [provider]     Positive ROS: Otherwise negative.  No cardiac history.  All other systems have been  reviewed and were otherwise negative with the exception of those mentioned in the HPI and as above.  Physical Exam: Constitutional: Alert, well-appearing, no acute distress Ears: External ears without lesions or tenderness. Ear canals are clear bilaterally with intact, clear TMs.  Nasal: External nose without lesions. Septum with moderate deviation.  He has large swollen turbinates and mucous membranes on both sides..  No intranasal polyps noted.  Both middle meatus regions were clear. Oral: Lips and gums without lesions. Tongue and palate mucosa without lesions. Posterior oropharynx clear.  Patient status post tonsillectomy. Neck: No palpable adenopathy or masses Respiratory: Breathing comfortably.  Lungs clear to auscultation Cardiac exam: Regular rate and rhythm without murmur. Skin: No facial/neck lesions or rash noted.  Procedures  Assessment: Septal deviation with turbinate hypertrophy Chronic rhinitis with postnasal drainage.  Plan: Reviewed with patient and his wife concerning septoplasty and turbinate reductions. This will require overnight nasal packing. We will plan on scheduling this at their convenience.   Radene Journey, MD

## 2019-04-08 DIAGNOSIS — J343 Hypertrophy of nasal turbinates: Secondary | ICD-10-CM

## 2019-04-08 DIAGNOSIS — J342 Deviated nasal septum: Secondary | ICD-10-CM | POA: Diagnosis not present

## 2019-04-09 ENCOUNTER — Other Ambulatory Visit: Payer: Self-pay

## 2019-04-09 ENCOUNTER — Other Ambulatory Visit (INDEPENDENT_AMBULATORY_CARE_PROVIDER_SITE_OTHER): Payer: Self-pay | Admitting: Internal Medicine

## 2019-04-09 ENCOUNTER — Ambulatory Visit (INDEPENDENT_AMBULATORY_CARE_PROVIDER_SITE_OTHER): Payer: Medicare Other | Admitting: Otolaryngology

## 2019-04-09 VITALS — Temp 97.5°F

## 2019-04-09 DIAGNOSIS — Z4889 Encounter for other specified surgical aftercare: Secondary | ICD-10-CM

## 2019-04-09 NOTE — Progress Notes (Signed)
HPI: Justin Carroll is a 64 y.o. male who presents 1 days s/p septoplasty and turbinate reductions to have his nasal packs removed..   Past Medical History:  Diagnosis Date  . Depression    Past Surgical History:  Procedure Laterality Date  . TONSILLECTOMY     age 31   Social History   Socioeconomic History  . Marital status: Married    Spouse name: Not on file  . Number of children: Not on file  . Years of education: Not on file  . Highest education level: Not on file  Occupational History  . Not on file  Tobacco Use  . Smoking status: Former Smoker    Packs/day: 1.50    Years: 10.00    Pack years: 15.00    Start date: 1995    Quit date: 2015    Years since quitting: 6.1  . Smokeless tobacco: Never Used  Substance and Sexual Activity  . Alcohol use: No  . Drug use: No  . Sexual activity: Not on file  Other Topics Concern  . 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   Social Determinants of Health   Financial Resource Strain:   . Difficulty of Paying Living Expenses: Not on file  Food Insecurity:   . Worried About Charity fundraiser in the Last Year: Not on file  . Ran Out of Food in the Last Year: Not on file  Transportation Needs:   . Lack of Transportation (Medical): Not on file  . Lack of Transportation (Non-Medical): Not on file  Physical Activity:   . Days of Exercise per Week: Not on file  . Minutes of Exercise per Session: Not on file  Stress:   . Feeling of Stress : Not on file  Social Connections:   . Frequency of Communication with Friends and Family: Not on file  . Frequency of Social Gatherings with Friends and Family: Not on file  . Attends Religious Services: Not on file  . Active Member of Clubs or Organizations: Not on file  . Attends Archivist Meetings: Not on file  . Marital Status: Not on file   Family History  Problem Relation Age of Onset  . Colon cancer Neg Hx   . Pancreatic cancer Neg Hx   .  Rectal cancer Neg Hx   . Stomach cancer Neg Hx    No Known Allergies Prior to Admission medications   Medication Sig Start Date End Date Taking? Authorizing Provider  buPROPion (WELLBUTRIN XL) 300 MG 24 hr tablet Take 300 mg by mouth daily.    [provider]  Cholecalciferol (VITAMIN D-3) 25 MCG (1000 UT) CAPS Take 2 capsules by mouth daily.    [provider]  donepezil (ARICEPT) 10 MG tablet Start by taking 1/2 tablet (5mg  ) once a day for 30 days. 03/02/19   Dohmeier, Asencion Partridge, MD  levocetirizine (XYZAL) 5 MG tablet Take 5 mg by mouth every evening.    [provider]  pantoprazole (PROTONIX) 40 MG tablet Take 40 mg by mouth daily.    [provider]  tamsulosin (FLOMAX) 0.4 MG CAPS capsule Take 0.4 mg by mouth daily after supper.    [provider]  testosterone cypionate (DEPO-TESTOSTERONE) 200 MG/ML injection Inject 0.5 mLs (100 mg total) into the muscle every 7 (seven) days. 02/11/19   Doree Albee, MD  traZODone (DESYREL) 100 MG tablet Take 100 mg by mouth at bedtime.  [provider]  buPROPion (WELLBUTRIN XL) 300 MG 24 hr tablet Take 300 mg by mouth daily.    [provider]  traZODone (DESYREL) 100 MG tablet Take 100 mg by mouth at bedtime.    [provider]     Physical Exam: Nasal packs were removed in the office today with minimal bleeding.   Assessment: S/p septoplasty and turbinate reductions performed yesterday  Plan: Instructed to use saline irrigations 2 or 3 times a day. He will follow up in 1 week for recheck cleaning and removal of septal splints.   Radene Journey, MD

## 2019-04-16 ENCOUNTER — Other Ambulatory Visit: Payer: Self-pay

## 2019-04-16 ENCOUNTER — Ambulatory Visit (INDEPENDENT_AMBULATORY_CARE_PROVIDER_SITE_OTHER): Payer: Medicare Other | Admitting: Otolaryngology

## 2019-04-16 VITALS — Temp 97.9°F

## 2019-04-16 DIAGNOSIS — Z4889 Encounter for other specified surgical aftercare: Secondary | ICD-10-CM

## 2019-04-16 NOTE — Progress Notes (Signed)
HPI: Justin Carroll is a 64 y.o. male who presents 8 days s/p septoplasty and turbinate reductions.  Presents today to have his splints removed..   Past Medical History:  Diagnosis Date  . Depression    Past Surgical History:  Procedure Laterality Date  . TONSILLECTOMY     age 63   Social History   Socioeconomic History  . Marital status: Married    Spouse name: Not on file  . Number of children: Not on file  . Years of education: Not on file  . Highest education level: Not on file  Occupational History  . Not on file  Tobacco Use  . Smoking status: Former Smoker    Packs/day: 1.50    Years: 10.00    Pack years: 15.00    Start date: 1995    Quit date: 2015    Years since quitting: 6.1  . Smokeless tobacco: Never Used  Substance and Sexual Activity  . Alcohol use: No  . Drug use: No  . Sexual activity: Not on file  Other Topics Concern  . 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   Social Determinants of Health   Financial Resource Strain:   . Difficulty of Paying Living Expenses:   Food Insecurity:   . Worried About Charity fundraiser in the Last Year:   . Arboriculturist in the Last Year:   Transportation Needs:   . Film/video editor (Medical):   Marland Kitchen Lack of Transportation (Non-Medical):   Physical Activity:   . Days of Exercise per Week:   . Minutes of Exercise per Session:   Stress:   . Feeling of Stress :   Social Connections:   . Frequency of Communication with Friends and Family:   . Frequency of Social Gatherings with Friends and Family:   . Attends Religious Services:   . Active Member of Clubs or Organizations:   . Attends Archivist Meetings:   Marland Kitchen Marital Status:    Family History  Problem Relation Age of Onset  . Colon cancer Neg Hx   . Pancreatic cancer Neg Hx   . Rectal cancer Neg Hx   . Stomach cancer Neg Hx    No Known Allergies Prior to Admission medications   Medication Sig Start Date End  Date Taking? Authorizing Provider  buPROPion (WELLBUTRIN XL) 300 MG 24 hr tablet Take 300 mg by mouth daily.    [provider]  Cholecalciferol (VITAMIN D-3) 25 MCG (1000 UT) CAPS Take 2 capsules by mouth daily.    [provider]  donepezil (ARICEPT) 10 MG tablet Start by taking 1/2 tablet (5mg  ) once a day for 30 days. 03/02/19   Dohmeier, Asencion Partridge, MD  levocetirizine (XYZAL) 5 MG tablet Take 5 mg by mouth every evening.    [provider]  pantoprazole (PROTONIX) 40 MG tablet Take 40 mg by mouth daily.    [provider]  tamsulosin (FLOMAX) 0.4 MG CAPS capsule Take 0.4 mg by mouth daily after supper.    [provider]  testosterone cypionate (DEPO-TESTOSTERONE) 200 MG/ML injection Inject 0.5 mLs (100 mg total) into the muscle every 7 (seven) days. 02/11/19   Doree Albee, MD  traZODone (DESYREL) 100 MG tablet TAKE 1 TABLET BY MOUTH ONCE DAILY AT NIGHT 04/10/19 05/10/19  Hurshel Party C, MD  buPROPion (WELLBUTRIN XL) 300 MG 24 hr tablet Take 300 mg by mouth daily.    [provider]  traZODone (DESYREL) 100 MG tablet Take 100 mg by mouth at bedtime.    [provider]  traZODone (DESYREL) 100 MG tablet Take 100 mg by mouth at bedtime.    [provider]     Physical Exam: Septal splints were removed from either side on.  He had moderate crusting more so on the right side that was partially removed.  Nasal passages otherwise clear.   Assessment: S/p septoplasty and turbinate reductions  Plan: He will follow-up in 10 to 12 days for recheck and cleaning the nose.  Instructed him to use saline rinse a couple times a day.   Radene Journey, MD

## 2019-04-19 ENCOUNTER — Ambulatory Visit (INDEPENDENT_AMBULATORY_CARE_PROVIDER_SITE_OTHER): Payer: Medicare Other | Admitting: Neurology

## 2019-04-19 ENCOUNTER — Other Ambulatory Visit: Payer: Self-pay

## 2019-04-19 ENCOUNTER — Encounter: Payer: Self-pay | Admitting: Neurology

## 2019-04-19 VITALS — BP 139/91 | HR 72 | Temp 96.9°F | Ht 67.0 in | Wt 188.0 lb

## 2019-04-19 DIAGNOSIS — R41840 Attention and concentration deficit: Secondary | ICD-10-CM

## 2019-04-19 MED ORDER — DONEPEZIL HCL 10 MG PO TABS: ORAL_TABLET | ORAL | 3 refills | Status: DC

## 2019-04-19 MED ORDER — METHYLPHENIDATE HCL ER 20 MG PO TBCR: 20.0000 mg | EXTENDED_RELEASE_TABLET | Freq: Every day | ORAL | 0 refills | Status: DC

## 2019-04-19 NOTE — Patient Instructions (Signed)
Methylphenidate extended-release tablets What is this medicine? METHYLPHENIDATE (meth il FEN i date) is used to treat attention-deficit hyperactivity disorder (ADHD). It is also used to treat narcolepsy. This medicine may be used for other purposes; ask your health care provider or pharmacist if you have questions. COMMON BRAND NAME(S): Concerta, Metadate ER, Methylin, RELEXXII, Ritalin SR What should I tell my health care provider before I take this medicine? They need to know if you have any of these conditions:  anxiety or panic attacks  circulation problems in fingers and toes  difficulty swallowing, problems with the esophagus, or a history of blockage of the stomach or intestines  glaucoma  hardening or blockages of the arteries or heart blood vessels  heart disease or a heart defect  high blood pressure  history of a drug or alcohol abuse problem  history of stroke  liver disease  mental illness  motor tics, family history or diagnosis of Tourette's syndrome  seizures  suicidal thoughts, plans, or attempt; a previous suicide attempt by you or a family member  thyroid disease  an unusual or allergic reaction to methylphenidate, other medicines, foods, dyes, or preservatives  pregnant or trying to get pregnant  breast-feeding How should I use this medicine? Take this medicine by mouth with a glass of water. Follow the directions on the prescription label. Do not crush, cut, or chew the tablet. You may take this medicine with food. Take your medicine at regular intervals. Do not take it more often than directed. If you take your medicine more than once a day, try to take your last dose at least 8 hours before bedtime. This well help prevent the medicine from interfering with your sleep. A special MedGuide will be given to you by the pharmacist with each prescription and refill. Be sure to read this information carefully each time. Talk to your pediatrician regarding  the use of this medicine in children. While this drug may be prescribed for children as young as 6 years for selected conditions, precautions do apply. Overdosage: If you think you have taken too much of this medicine contact a poison control center or emergency room at once. NOTE: This medicine is only for you. Do not share this medicine with others. What if I miss a dose? If you miss a dose, take it as soon as you can. If it is almost time for your next dose, take only that dose. Do not take double or extra doses. What may interact with this medicine? Do not take this medicine with any of the following medications:  lithium  MAOIs like Carbex, Eldepryl, Marplan, Nardil, and Parnate  other stimulant medicines for attention disorders, weight loss, or to stay awake  procarbazine This medicine may also interact with the following medications:  atomoxetine  caffeine  certain medicines for blood pressure, heart disease, irregular heart beat  certain medicines for depression, anxiety, or psychotic disturbances  certain medicines for seizures like carbamazepine, phenobarbital, phenytoin  cold or allergy medicines  warfarin This list may not describe all possible interactions. Give your health care provider a list of all the medicines, herbs, non-prescription drugs, or dietary supplements you use. Also tell them if you smoke, drink alcohol, or use illegal drugs. Some items may interact with your medicine. What should I watch for while using this medicine? Visit your doctor or health care professional for regular checks on your progress. This prescription requires that you follow special procedures with your doctor and pharmacy. You will need to   have a new written prescription from your doctor or health care professional every time you need a refill. This medicine may affect your concentration, or hide signs of tiredness. Until you know how this drug affects you, do not drive, ride a  bicycle, use machinery, or do anything that needs mental alertness. Tell your doctor or health care professional if this medicine loses its effects, or if you feel you need to take more than the prescribed amount. Do not change the dosage without talking to your doctor or health care professional. For males, contact your doctor or health care professional right away if you have an erection that lasts longer than 4 hours or if it becomes painful. This may be a sign of a serious problem and must be treated right away to prevent permanent damage. Decreased appetite is a common side effect when starting this medicine. Eating small, frequent meals or snacks can help. Talk to your doctor if you continue to have poor eating habits. Height and weight growth of a child taking this medicine will be monitored closely. Do not take this medicine close to bedtime. It may prevent you from sleeping. The tablet shell for some brands of this medicine does not dissolve. This is normal. The tablet shell may appear whole in the stool. This is not a cause for concern. If you are going to need surgery, a MRI, CT scan, or other procedure, tell your doctor that you are taking this medicine. You may need to stop taking this medicine before the procedure. Tell your doctor or healthcare professional right away if you notice unexplained wounds on your fingers and toes while taking this medicine. You should also tell your healthcare provider if you experience numbness or pain, changes in the skin color, or sensitivity to temperature in your fingers or toes. What side effects may I notice from receiving this medicine? Side effects that you should report to your doctor or health care professional as soon as possible:  allergic reactions like skin rash, itching or hives, swelling of the face, lips, or tongue  changes in vision  chest pain or chest tightness  fast, irregular heartbeat  fingers or toes feel numb, cool,  painful  hallucination, loss of contact with reality  high blood pressure  males: prolonged or painful erection  seizures  severe headaches  severe stomach pain, vomiting  shortness of breath  suicidal thoughts or other mood changes  trouble swallowing  trouble walking, dizziness, loss of balance or coordination  uncontrollable head, mouth, neck, arm, or leg movements  unusual bleeding or bruising Side effects that usually do not require medical attention (report to your doctor or health care professional if they continue or are bothersome):  anxious  headache  loss of appetite  nausea  trouble sleeping  weight loss This list may not describe all possible side effects. Call your doctor for medical advice about side effects. You may report side effects to FDA at 1-800-FDA-1088. Where should I keep my medicine? Keep out of the reach of children. This medicine can be abused. Keep your medicine in a safe place to protect it from theft. Do not share this medicine with anyone. Selling or giving away this medicine is dangerous and against the law. This medicine may cause accidental overdose and death if taken by other adults, children, or pets. Mix any unused medicine with a substance like cat litter or coffee grounds. Then throw the medicine away in a sealed container like a sealed bag or  a coffee can with a lid. Do not use the medicine after the expiration date. Store at room temperature between 15 and 30 degrees C (59 and 86 degrees F). Protect from light and moisture. Keep container tightly closed. NOTE: This sheet is a summary. It may not cover all possible information. If you have questions about this medicine, talk to your doctor, pharmacist, or health care provider.  2020 Elsevier/Gold Standard (2016-05-28 15:01:37) Memory Compensation Strategies  1. Use "WARM" strategy.  W= write it down  A= associate it  R= repeat it  M= make a mental note  2.   You can keep a  Social worker.  Use a 3-ring notebook with sections for the following: calendar, important names and phone numbers,  medications, doctors' names/phone numbers, lists/reminders, and a section to journal what you did  each day.   3.    Use a calendar to write appointments down.  4.    Write yourself a schedule for the day.  This can be placed on the calendar or in a separate section of the Memory Notebook.  Keeping a  regular schedule can help memory.  5.    Use medication organizer with sections for each day or morning/evening pills.  You may need help loading it  6.    Keep a basket, or pegboard by the door.  Place items that you need to take out with you in the basket or on the pegboard.  You may also want to  include a message board for reminders.  7.    Use sticky notes.  Place sticky notes with reminders in a place where the task is performed.  For example: " turn off the  stove" placed by the stove, "lock the door" placed on the door at eye level, " take your medications" on  the bathroom mirror or by the place where you normally take your medications.  8.    Use alarms/timers.  Use while cooking to remind yourself to check on food or as a reminder to take your medicine, or as a  reminder to make a call, or as a reminder to perform another task, etc.

## 2019-04-19 NOTE — Progress Notes (Addendum)
Provider:  Larey Seat, M D  Referring Provider: Doree Albee, MD Primary Care Physician:  Justin Albee, MD       pt with wife, rm 30. pt states that he has been working on some memory exercisies since last visit. wife states he still has some difficulty with short memory. following up post PET scan results.                      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  Evaluation of Memory.  He is accompanied by his wife. He has not gotten lost , he has not had a fall since last visit! Still grouchy, but he is doing better with orientation, his work flow had been restructured. He has resigned form jury trials after our first meeting. This reduced his stress level. He mentioned his stress level in court rose with COVID, the jury trials on [place].  He is sleeping well. He is reportedly a recovering alcoholic for 33 years , no medication , no drugs, he is on Wellbutrin. He is breathing better,, following a nasal septal surgery on march 4th. Recovering well. [name].   His PET scan was normal (!) and his MOCA has a normal result today , too. We may not deal with a dementia at all. 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 Neuropsychological testing is pending. For JUNE (!)- Dr. [name].  Will see if he can see the new [place] neuropsychologist.  His wife tends to attribute his last years memory decline and recovery to reducing stress and distractions. He may have adult ADD. ADHD ? I will give a try to Ritalin.  He stays on Aricept now 10 mg. requests a 90 day supply.   CD    Dr. Anastasio Champion  added that the patient had come somewhat more irritable at times and his wife used the term "grouchy". He has not lost awareness or consciousness with any of his falls they don't seem to be syncope or seizures but they're sudden. He denies any lightheadedness or vertigo. He  has not felt clumsy whilst walking and his speech has not changed he has also asked for an ENT referral as he has frequent sinus congestion. He has seen Dr Lucia Gaskins, he will do a septal deviation repair.   The family history is positive for a father with coronary artery disease, pulmonary disease/ COPD , who was heavy smoker. A brother was post mortem diagnosed with vascular dementia.   There may have been a progression slowly over the last 3 years or so of memory loss being noticeable but over the last 6 months the family has noted a significant progression. Especially with getting lost on a familiar road on a routine drive. He has a completely disorganized office, he has misfiled papers, lost papers. His Network engineer and his wife have kept him from taking charts home.   He has impaired depth perception, has trouble parking a larger vehicle, but stated he has no trouble reading.  He reportedly has been unimpaired when it comes to accounting, but this is mostly done by his wife.   He has very good and very bad days, is usually better in the morning when it comes to cognitive performance. He has lost words, skips sometimes words, non-fluent - he makes more small talk than actual conversation.  He  denies nocturnal activity, acting out dreams, but is a restless sleeper.   An MRI brain without contrast did not reveal abnormalities. [name] ordered.  Part of our exam today was a Montreal cognitive assessment , abbreviated MOCA, and [name] scored 24 out of 30 points , placing him just at the borderline between a mild cognitive impairment and a beginning dementia.  There was impairment with visual-spatial problem-solving to copy a cube was difficult he could name three animals could repeat words and a series of letters he was very good for subtraction and mathematics he remembered 3 out of 5 delay words. His orientation to place and time was completely intact.  Montreal Cognitive Assessment  04/19/2019  02/18/2019  Visuospatial/ Executive (0/5) 4 3  Naming (0/3) 3 3  Attention: Read list of digits (0/2) 2 2  Attention: Read list of letters (0/1) 1 1  Attention: Serial 7 subtraction starting at 100 (0/3) 3 3  Language: Repeat phrase (0/2) 1 2  Language : Fluency (0/1) 1 1  Abstraction (0/2) 2 2  Delayed Recall (0/5) 5 3  Orientation (0/6) 6 6  Total 28 26     Review of Systems: Out of a complete 14 system review, the patient complains of only the following symptoms, and all other reviewed systems are negative. Confusion, disorganized, forgetful, - inattentive?  personality changes , no more sudden falls.  Loss of libido.     Social History   Socioeconomic History  . Marital status: Married    Spouse name: Not on file  . Number of children: Not on file  . Years of education: Not on file  . Highest education level: Not on file  Occupational History  . Not on file  Tobacco Use  . Smoking status: Former Smoker    Packs/day: 1.50    Years: 10.00    Pack years: 15.00    Start date: 1995    Quit date: 2015    Years since quitting: 6.0  . Smokeless tobacco: Never Used  Substance and Sexual Activity  . Alcohol use: No  . Drug use: No  . Sexual activity: Not on file  Other Topics Concern  . 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   Social Determinants of Health   Financial Resource Strain:   . Difficulty of Paying Living Expenses: Not on file  Food Insecurity:   . Worried About Charity fundraiser in the Last Year: Not on file  . Ran Out of Food in the Last Year: Not on file  Transportation Needs:   . Lack of Transportation (Medical): Not on file  . Lack of Transportation (Non-Medical): Not on file  Physical Activity:   . Days of Exercise per Week: Not on file  . Minutes of Exercise per Session: Not on file  Stress:   . Feeling of Stress : Not on file  Social Connections:   . Frequency of Communication with Friends and Family: Not  on file  . Frequency of Social Gatherings with Friends and Family: Not on file  . Attends Religious Services: Not on file  . Active Member of Clubs or Organizations: Not on file  . Attends Archivist Meetings: Not on file  . Marital Status: Not on file  Intimate Partner Violence:   . Fear of Current or Ex-Partner: Not on file  . Emotionally Abused: Not on file  . Physically Abused: Not on file  . Sexually  Abused: Not on file    Family History  Problem Relation Age of Onset  . Colon cancer Neg Hx   . Pancreatic cancer Neg Hx   . Rectal cancer Neg Hx   . Stomach cancer Neg Hx     Past Medical History:  Diagnosis Date  . Depression     Past Surgical History:  Procedure Laterality Date  . TONSILLECTOMY     age 64    Current Outpatient Medications  Medication Sig Dispense Refill  . buPROPion (WELLBUTRIN XL) 300 MG 24 hr tablet Take 300 mg by mouth daily.    . Cholecalciferol (VITAMIN D-3) 25 MCG (1000 UT) CAPS Take 2 capsules by mouth daily.    Marland Kitchen donepezil (ARICEPT) 10 MG tablet Start by taking 1/2 tablet (5mg  ) once a day for 30 days. 30 tablet 0  . levocetirizine (XYZAL) 5 MG tablet Take 5 mg by mouth every evening.    . pantoprazole (PROTONIX) 40 MG tablet Take 40 mg by mouth daily.    . tamsulosin (FLOMAX) 0.4 MG CAPS capsule Take 0.4 mg by mouth daily after supper.    . testosterone cypionate (DEPO-TESTOSTERONE) 200 MG/ML injection Inject 0.5 mLs (100 mg total) into the muscle every 7 (seven) days. 10 mL 0  . traZODone (DESYREL) 100 MG tablet TAKE 1 TABLET BY MOUTH ONCE DAILY AT NIGHT 30 tablet 2   No current facility-administered medications for this visit.    Allergies as of 04/19/2019  . (No Known Allergies)    Vitals: BP (!) 139/91   Pulse 72   Temp (!) 96.9 F (36.1 C)   Ht 5\' 7"  (1.702 m)   Wt 188 lb (85.3 kg)   BMI 29.44 kg/m  Last Weight:  Wt Readings from Last 1 Encounters:  04/19/19 188 lb (85.3 kg)   Last Height:   Ht Readings from  Last 1 Encounters:  04/19/19 5\' 7"  (1.702 m)    Physical exam:  General: The patient is awake, alert and appears not in acute distress. The patient is well groomed. Head: Normocephalic, atraumatic. Neck is supple. Mallampati 3, neck circumference:15.5 Cardiovascular:  Regular rate and rhythm, without  murmurs or carotid bruit, and without distended neck veins. Respiratory: Lungs are clear to auscultation. Skin:  Without evidence of edema, or rash Trunk: BMI 29.4 kg/m2  Neurologic exam : The patient is awake and alert, oriented to place and time.   Memory subjective described as impaired . There is a significantly reduced attention span & concentration ability. Speech is non-fluent without dysarthria, dysphonia -but beginning? aphasia.  Mood and affect are aloof.  He appears now able to focus better.     Cranial nerves: Pupils are equally round, large  and remain reactive to light.  Funduscopic exam without evidence of pallor or edema.  Extraocular movements in vertical and horizontal planes impaired, he can not follow a moving object horizontally or vertically ( Visual fields by finger perimetry remains intact). When asked to direct his gaze to the sides or upwards, downwards, he can do so.  Hearing to finger rub intact.  Facial motor strength is symmetric and tongue and uvula move midline.  Shoulder shrug is normal.   Motor exam:  Normal tone, able to relax completely- muscle bulk is equal left and right-  and symmetric strength in all extremities is found.  Sensory: to fine touch, pinprick and vibration were intact /normal. Coordination: Rapid alternating movements in the fingers/hands were normal. Finger-to-nose maneuver  normal without evidence  of ataxia, dysmetria or tremor. Gait and station: Patient walks without assistive device and today's Romberg testing is again negative.   Deep tendon reflexes: in the upper and lower extremities are symmetric and intact. Babinski  maneuver response is downgoing.    I reviewed Mr. Warfield Ficke medical record, Dr. Lanice Shirts notes and the MRI was reviewed in person and explained to the patient I do not see any abnormality for the frontal lobe or brainstem which could correlate with his eye movement problem. Visit 25 minutes.   Nothing on PET scan either. Normal MOCA today.     Assessment:  After physical and neurologic examination, review of laboratory studies, imaging, neurophysiology testing and pre-existing records, assessment is that of :  Adult ADHD residual ? Started ritalin XR 20 mg once a day , 30 days Questionable dementia Dx.  Still keep on Aricept. Changed to 90 days.    Plan:  Treatment plan and additional workup :   Larey Seat MD 04/19/2019

## 2019-04-22 ENCOUNTER — Other Ambulatory Visit (INDEPENDENT_AMBULATORY_CARE_PROVIDER_SITE_OTHER): Payer: Self-pay | Admitting: Internal Medicine

## 2019-04-22 ENCOUNTER — Telehealth (INDEPENDENT_AMBULATORY_CARE_PROVIDER_SITE_OTHER): Payer: Self-pay | Admitting: Internal Medicine

## 2019-04-22 ENCOUNTER — Other Ambulatory Visit (INDEPENDENT_AMBULATORY_CARE_PROVIDER_SITE_OTHER): Payer: Self-pay

## 2019-04-22 MED ORDER — PANTOPRAZOLE SODIUM 40 MG PO TBEC: 40.0000 mg | DELAYED_RELEASE_TABLET | Freq: Every day | ORAL | 0 refills | Status: DC

## 2019-04-22 MED ORDER — TAMSULOSIN HCL 0.4 MG PO CAPS: 0.4000 mg | ORAL_CAPSULE | Freq: Every day | ORAL | 0 refills | Status: DC

## 2019-04-22 MED ORDER — TAMSULOSIN HCL 0.4 MG PO CAPS: 0.4000 mg | ORAL_CAPSULE | Freq: Every day | ORAL | 3 refills | Status: DC

## 2019-04-26 ENCOUNTER — Other Ambulatory Visit: Payer: Self-pay

## 2019-04-26 ENCOUNTER — Encounter (INDEPENDENT_AMBULATORY_CARE_PROVIDER_SITE_OTHER): Payer: Self-pay | Admitting: Otolaryngology

## 2019-04-26 ENCOUNTER — Ambulatory Visit (INDEPENDENT_AMBULATORY_CARE_PROVIDER_SITE_OTHER): Payer: Medicare Other | Admitting: Otolaryngology

## 2019-04-26 ENCOUNTER — Telehealth (INDEPENDENT_AMBULATORY_CARE_PROVIDER_SITE_OTHER): Payer: Self-pay | Admitting: Internal Medicine

## 2019-04-26 ENCOUNTER — Other Ambulatory Visit (INDEPENDENT_AMBULATORY_CARE_PROVIDER_SITE_OTHER): Payer: Self-pay | Admitting: Internal Medicine

## 2019-04-26 VITALS — Temp 98.1°F

## 2019-04-26 DIAGNOSIS — Z4889 Encounter for other specified surgical aftercare: Secondary | ICD-10-CM

## 2019-04-26 MED ORDER — BUPROPION HCL ER (XL) 300 MG PO TB24: 300.0000 mg | ORAL_TABLET | Freq: Every day | ORAL | 0 refills | Status: DC

## 2019-04-26 NOTE — Progress Notes (Signed)
HPI: Justin Carroll is a 64 y.o. male who presents 18 days s/p septoplasty and turbinate reductions.  He is doing well and breathing better..   Past Medical History:  Diagnosis Date  . Depression    Past Surgical History:  Procedure Laterality Date  . TONSILLECTOMY     age 22   Social History   Socioeconomic History  . Marital status: Married    Spouse name: Not on file  . Number of children: Not on file  . Years of education: Not on file  . Highest education level: Not on file  Occupational History  . Not on file  Tobacco Use  . Smoking status: Former Smoker    Packs/day: 1.50    Years: 10.00    Pack years: 15.00    Start date: 1995    Quit date: 2015    Years since quitting: 6.2  . Smokeless tobacco: Never Used  Substance and Sexual Activity  . Alcohol use: No  . Drug use: No  . Sexual activity: Not on file  Other Topics Concern  . 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   Social Determinants of Health   Financial Resource Strain:   . Difficulty of Paying Living Expenses:   Food Insecurity:   . Worried About Charity fundraiser in the Last Year:   . Arboriculturist in the Last Year:   Transportation Needs:   . Film/video editor (Medical):   Marland Kitchen Lack of Transportation (Non-Medical):   Physical Activity:   . Days of Exercise per Week:   . Minutes of Exercise per Session:   Stress:   . Feeling of Stress :   Social Connections:   . Frequency of Communication with Friends and Family:   . Frequency of Social Gatherings with Friends and Family:   . Attends Religious Services:   . Active Member of Clubs or Organizations:   . Attends Archivist Meetings:   Marland Kitchen Marital Status:    Family History  Problem Relation Age of Onset  . Colon cancer Neg Hx   . Pancreatic cancer Neg Hx   . Rectal cancer Neg Hx   . Stomach cancer Neg Hx    No Known Allergies Prior to Admission medications   Medication Sig Start Date End Date  Taking? Authorizing Provider  buPROPion (WELLBUTRIN XL) 300 MG 24 hr tablet Take 1 tablet (300 mg total) by mouth daily. 04/26/19  Yes Gosrani, Nimish C, MD  Cholecalciferol (VITAMIN D-3) 25 MCG (1000 UT) CAPS Take 2 capsules by mouth daily.   Yes [provider]  donepezil (ARICEPT) 10 MG tablet One a day po. 04/19/19  Yes Dohmeier, Asencion Partridge, MD  levocetirizine (XYZAL) 5 MG tablet Take 5 mg by mouth every evening.   Yes [provider]  methylphenidate (METADATE ER) 20 MG ER tablet Take 1 tablet (20 mg total) by mouth daily. 04/19/19  Yes Dohmeier, Asencion Partridge, MD  pantoprazole (PROTONIX) 40 MG tablet Take 1 tablet (40 mg total) by mouth daily. 04/22/19  Yes Gosrani, Nimish C, MD  tamsulosin (FLOMAX) 0.4 MG CAPS capsule Take 1 capsule (0.4 mg total) by mouth daily after supper. 04/22/19  Yes Gosrani, Nimish C, MD  testosterone cypionate (DEPO-TESTOSTERONE) 200 MG/ML injection Inject 0.5 mLs (100 mg total) into the muscle every 7 (seven) days. 02/11/19  Yes Gosrani, Nimish C, MD  traZODone (DESYREL) 100 MG tablet TAKE 1 TABLET BY MOUTH ONCE DAILY AT NIGHT  04/10/19 05/10/19 Yes Gosrani, Nimish C, MD  buPROPion (WELLBUTRIN XL) 300 MG 24 hr tablet Take 300 mg by mouth daily.    [provider]  traZODone (DESYREL) 100 MG tablet Take 100 mg by mouth at bedtime.    [provider]  traZODone (DESYREL) 100 MG tablet Take 100 mg by mouth at bedtime.    [provider]     Physical Exam: Still has moderate scabbing on the right inferior turbinate with minimal scabbing on the left side.   Assessment: S/p septoplasty and turbinate reductions  Plan: Recommended use of saline irrigation and he will follow-up in 2 weeks for recheck and cleaning.   Radene Journey, MD

## 2019-04-27 NOTE — Telephone Encounter (Signed)
done

## 2019-04-27 NOTE — Telephone Encounter (Signed)
Done

## 2019-05-11 ENCOUNTER — Encounter (INDEPENDENT_AMBULATORY_CARE_PROVIDER_SITE_OTHER): Payer: Self-pay

## 2019-05-11 ENCOUNTER — Encounter (INDEPENDENT_AMBULATORY_CARE_PROVIDER_SITE_OTHER): Payer: Medicare Other | Admitting: Otolaryngology

## 2019-05-11 ENCOUNTER — Other Ambulatory Visit (INDEPENDENT_AMBULATORY_CARE_PROVIDER_SITE_OTHER): Payer: Self-pay | Admitting: Internal Medicine

## 2019-05-17 ENCOUNTER — Other Ambulatory Visit: Payer: Self-pay | Admitting: Neurology

## 2019-05-18 ENCOUNTER — Encounter: Payer: Self-pay | Admitting: Neurology

## 2019-05-18 MED ORDER — METHYLPHENIDATE HCL ER 20 MG PO TBCR: 20.0000 mg | EXTENDED_RELEASE_TABLET | Freq: Every day | ORAL | 0 refills | Status: DC

## 2019-05-18 MED ORDER — AMPHETAMINE-DEXTROAMPHETAMINE 10 MG PO TABS: ORAL_TABLET | ORAL | 0 refills | Status: DC

## 2019-07-07 ENCOUNTER — Encounter: Payer: Medicare Other | Admitting: Psychology

## 2019-07-17 ENCOUNTER — Other Ambulatory Visit (INDEPENDENT_AMBULATORY_CARE_PROVIDER_SITE_OTHER): Payer: Self-pay | Admitting: Internal Medicine

## 2019-07-28 ENCOUNTER — Other Ambulatory Visit: Payer: Self-pay

## 2019-07-28 ENCOUNTER — Encounter (INDEPENDENT_AMBULATORY_CARE_PROVIDER_SITE_OTHER): Payer: Self-pay | Admitting: Internal Medicine

## 2019-07-28 ENCOUNTER — Ambulatory Visit (INDEPENDENT_AMBULATORY_CARE_PROVIDER_SITE_OTHER): Payer: Medicare Other | Admitting: Internal Medicine

## 2019-07-28 VITALS — BP 128/72 | HR 74 | Temp 97.9°F | Resp 18 | Ht 67.0 in | Wt 186.0 lb

## 2019-07-28 DIAGNOSIS — F329 Major depressive disorder, single episode, unspecified: Secondary | ICD-10-CM

## 2019-07-28 DIAGNOSIS — K219 Gastro-esophageal reflux disease without esophagitis: Secondary | ICD-10-CM | POA: Diagnosis not present

## 2019-07-28 DIAGNOSIS — R413 Other amnesia: Secondary | ICD-10-CM

## 2019-07-28 DIAGNOSIS — R404 Transient alteration of awareness: Secondary | ICD-10-CM | POA: Diagnosis not present

## 2019-07-28 DIAGNOSIS — R6882 Decreased libido: Secondary | ICD-10-CM

## 2019-07-28 DIAGNOSIS — N401 Enlarged prostate with lower urinary tract symptoms: Secondary | ICD-10-CM

## 2019-07-28 DIAGNOSIS — F32A Depression, unspecified: Secondary | ICD-10-CM

## 2019-07-28 MED ORDER — PANTOPRAZOLE SODIUM 40 MG PO TBEC: 40.0000 mg | DELAYED_RELEASE_TABLET | Freq: Every day | ORAL | 0 refills | Status: DC

## 2019-07-28 MED ORDER — TAMSULOSIN HCL 0.4 MG PO CAPS: 0.4000 mg | ORAL_CAPSULE | Freq: Every day | ORAL | 0 refills | Status: DC

## 2019-07-28 MED ORDER — BUPROPION HCL ER (XL) 300 MG PO TB24: 300.0000 mg | ORAL_TABLET | Freq: Every day | ORAL | 0 refills | Status: DC

## 2019-07-28 MED ORDER — TRAZODONE HCL 100 MG PO TABS: ORAL_TABLET | ORAL | 0 refills | Status: DC

## 2019-07-28 NOTE — Progress Notes (Signed)
Metrics: Intervention Frequency ACO  Documented Smoking Status Yearly  Screened one or more times in 24 months  Cessation Counseling or  Active cessation medication Past 24 months  Past 24 months   Guideline developer: UpToDate (See UpToDate for funding source) Date Released: 2014       Wellness Office Visit  Subjective:  Patient ID: Justin Carroll, male    DOB: Apr 05, 1955  Age: 64 y.o. MRN: [id number]  CC: This man comes in to review his medications and discuss them with me. HPI  Since January of last year, we have been dealing with memory loss issues and he has seen a neurologist, Dr. Brett Fairy.  She he has been extensively investigated.  It now appears that he may not have dementia and more of attention deficit disorder.  He does not feel Adderall is really helped him and he really wants to stop taking the Aricept.  I tend to agree with him at this point.  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  I think he is actually improving. He still takes Protonix for his gastroesophageal reflux disease.  He needs a refill of this. He continues to take Flomax for his BPH. Past Medical History:  Diagnosis Date  . Depression    Past Surgical History:  Procedure Laterality Date  . TONSILLECTOMY     age 63     Family History  Problem Relation Age of Onset  . Colon cancer Neg Hx   . Pancreatic cancer Neg Hx   . Rectal cancer Neg Hx   . 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   Social History   Tobacco Use  . Smoking status: Former Smoker    Packs/day: 1.50    Years: 10.00    Pack years: 15.00    Start date: 1995    Quit date: 2015    Years since quitting: 6.4  . Smokeless tobacco: Never Used  Substance Use Topics  . Alcohol use: No    Current Meds  Medication Sig  . buPROPion (WELLBUTRIN XL) 300 MG 24 hr tablet Take 1 tablet (300 mg total) by mouth daily.  .  Cholecalciferol (VITAMIN D-3) 25 MCG (1000 UT) CAPS Take 2 capsules by mouth daily.  Marland Kitchen donepezil (ARICEPT) 10 MG tablet One a day po.  . levocetirizine (XYZAL) 5 MG tablet Take 5 mg by mouth every evening.  . pantoprazole (PROTONIX) 40 MG tablet Take 1 tablet (40 mg total) by mouth daily.  . tamsulosin (FLOMAX) 0.4 MG CAPS capsule Take 1 capsule (0.4 mg total) by mouth daily after supper.  . testosterone cypionate (DEPO-TESTOSTERONE) 200 MG/ML injection Inject 0.5 mLs (100 mg total) into the muscle every 7 (seven) days.  . traZODone (DESYREL) 100 MG tablet TAKE 1 TABLET BY MOUTH ONCE DAILY AT NIGHT  . [DISCONTINUED] amphetamine-dextroamphetamine (ADDERALL) 10 MG tablet Take 1 tablet before breakfast and another tablet before lunch  . [DISCONTINUED] buPROPion (WELLBUTRIN XL) 300 MG 24 hr tablet Take 1 tablet (300 mg total) by mouth daily.  . [DISCONTINUED] pantoprazole (PROTONIX) 40 MG tablet Take 1 tablet (40 mg total) by mouth daily.  . [DISCONTINUED] tamsulosin (FLOMAX) 0.4 MG CAPS capsule Take 1 capsule (0.4 mg total) by mouth daily after supper.  . [DISCONTINUED] traZODone (DESYREL) 100 MG tablet TAKE 1 TABLET BY MOUTH ONCE DAILY AT NIGHT      No flowsheet  data found.   Objective:   Today's Vitals: BP 128/72 (BP Location: Right Arm, Patient Position: Sitting, Cuff Size: Normal)   Pulse 74   Temp 97.9 F (36.6 C) (Temporal)   Resp 18   Ht 5\' 7"  (1.702 m)   Wt 186 lb (84.4 kg)   SpO2 98%   BMI 29.13 kg/m  Vitals with BMI 07/28/2019 04/19/2019 02/18/2019  Height 5\' 7"  5\' 7"  5\' 7"   Weight 186 lbs 188 lbs 189 lbs  BMI 29.12 09.38 18.29  Systolic 937 169 678  Diastolic 72 91 86  Pulse 74 72 84     Physical Exam  He looks systemically well.  He looks more relaxed.  Blood pressure is better.  Alert and orientated.     Assessment   1. Memory loss   2. Decreased libido   3. Transient alteration of awareness   4. Gastroesophageal reflux disease without esophagitis   5.  Depression, unspecified depression type   6. Benign prostatic hyperplasia with lower urinary tract symptoms, symptom details unspecified       Tests ordered No orders of the defined types were placed in this encounter.    Plan: 1. We discussed the use of testosterone therapy and is willing to try this again on a more regular basis. 2. He will continue with Flomax for his BPH and I have sent a refill. 3. He will continue with Protonix for his gastroesophageal reflux disease. 4. I will see him in about 6 weeks time to see how he is doing and we will check all the blood work then.   Meds ordered this encounter  Medications  . pantoprazole (PROTONIX) 40 MG tablet    Sig: Take 1 tablet (40 mg total) by mouth daily.    Dispense:  90 tablet    Refill:  0  . tamsulosin (FLOMAX) 0.4 MG CAPS capsule    Sig: Take 1 capsule (0.4 mg total) by mouth daily after supper.    Dispense:  90 capsule    Refill:  0  . buPROPion (WELLBUTRIN XL) 300 MG 24 hr tablet    Sig: Take 1 tablet (300 mg total) by mouth daily.    Dispense:  90 tablet    Refill:  0  . traZODone (DESYREL) 100 MG tablet    Sig: TAKE 1 TABLET BY MOUTH ONCE DAILY AT NIGHT    Dispense:  90 tablet    Refill:  0    Emili Mcloughlin Luther Parody, MD

## 2019-09-14 ENCOUNTER — Other Ambulatory Visit: Payer: Self-pay

## 2019-09-14 ENCOUNTER — Encounter (INDEPENDENT_AMBULATORY_CARE_PROVIDER_SITE_OTHER): Payer: Self-pay | Admitting: Internal Medicine

## 2019-09-14 ENCOUNTER — Ambulatory Visit (INDEPENDENT_AMBULATORY_CARE_PROVIDER_SITE_OTHER): Payer: Medicare Other | Admitting: Internal Medicine

## 2019-09-14 VITALS — BP 140/95 | HR 84 | Temp 97.5°F | Ht 67.0 in | Wt 187.0 lb

## 2019-09-14 DIAGNOSIS — F988 Other specified behavioral and emotional disorders with onset usually occurring in childhood and adolescence: Secondary | ICD-10-CM

## 2019-09-14 DIAGNOSIS — R6882 Decreased libido: Secondary | ICD-10-CM | POA: Diagnosis not present

## 2019-09-14 DIAGNOSIS — R5383 Other fatigue: Secondary | ICD-10-CM

## 2019-09-14 DIAGNOSIS — R5381 Other malaise: Secondary | ICD-10-CM | POA: Diagnosis not present

## 2019-09-14 NOTE — Progress Notes (Signed)
Metrics: Intervention Frequency ACO  Documented Smoking Status Yearly  Screened one or more times in 24 months  Cessation Counseling or  Active cessation medication Past 24 months  Past 24 months   Guideline developer: UpToDate (See UpToDate for funding source) Date Released: 2014       Wellness Office Visit  Subjective:  Patient ID: Justin Carroll, male    DOB: [birth year]  Age: 64 y.o. MRN: [id number]  CC: This man comes in for follow-up of testosterone therapy, ADD, malaise and fatigue. HPI  I think overall, he does not have a diagnosis of dementia but more of ADD and he has been using Adderall again which seems to be helping. Unfortunately although is post be taking testosterone therapy once a week, his last injection was 9 days ago. He continues on antidepressant medication as well. Past Medical History:  Diagnosis Date  . Depression    Past Surgical History:  Procedure Laterality Date  . TONSILLECTOMY     age 14     Family History  Problem Relation Age of Onset  . Colon cancer Neg Hx   . Pancreatic cancer Neg Hx   . Rectal cancer Neg Hx   . 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   Social History   Tobacco Use  . Smoking status: Former Smoker    Packs/day: 1.50    Years: 10.00    Pack years: 15.00    Start date: 1995    Quit date: 2015    Years since quitting: 6.6  . Smokeless tobacco: Never Used  Substance Use Topics  . Alcohol use: No    Current Meds  Medication Sig  . amphetamine-dextroamphetamine (ADDERALL) 20 MG tablet Take 20 mg by mouth daily.  Marland Kitchen buPROPion (WELLBUTRIN XL) 300 MG 24 hr tablet Take 1 tablet (300 mg total) by mouth daily.  . Cholecalciferol (VITAMIN D-3) 25 MCG (1000 UT) CAPS Take 2 capsules by mouth daily.  Marland Kitchen levocetirizine (XYZAL) 5 MG tablet Take 5 mg by mouth every evening.  . pantoprazole (PROTONIX) 40 MG tablet Take 1 tablet (40 mg total) by mouth daily.  .  tamsulosin (FLOMAX) 0.4 MG CAPS capsule Take 1 capsule (0.4 mg total) by mouth daily after supper.  . testosterone cypionate (DEPO-TESTOSTERONE) 200 MG/ML injection Inject 0.5 mLs (100 mg total) into the muscle every 7 (seven) days.  . traZODone (DESYREL) 100 MG tablet TAKE 1 TABLET BY MOUTH ONCE DAILY AT NIGHT      No flowsheet data found.   Objective:   Today's Vitals: BP (!) 140/95 (BP Location: Left Arm, Patient Position: Sitting, Cuff Size: Normal)   Pulse 84   Temp (!) 97.5 F (36.4 C) (Temporal)   Ht 5\' 7"  (1.702 m)   Wt 187 lb (84.8 kg)   SpO2 96%   BMI 29.29 kg/m  Vitals with BMI 09/14/2019 07/28/2019 04/19/2019  Height 5\' 7"  5\' 7"  5\' 7"   Weight 187 lbs 186 lbs 188 lbs  BMI 29.28 79.02 40.97  Systolic 353 299 242  Diastolic 95 72 91  Pulse 84 74 72     Physical Exam  He remains overweight.  Blood pressure somewhat elevated today.  Alert and orientated without any focal neurological signs.     Assessment   1. Attention deficit disorder, unspecified hyperactivity presence   2. Decreased libido   3. Malaise and fatigue       Tests ordered  No orders of the defined types were placed in this encounter.    Plan: 1. I told him that it would not make much sense to check blood work today so I will see him in about a month's time and hopefully he has been taking testosterone therapy on a regular basis every week and then we can reassess.   No orders of the defined types were placed in this encounter.   Doree Albee, MD

## 2019-09-17 ENCOUNTER — Encounter (INDEPENDENT_AMBULATORY_CARE_PROVIDER_SITE_OTHER): Payer: Self-pay

## 2019-09-23 ENCOUNTER — Other Ambulatory Visit: Payer: Self-pay | Admitting: Neurology

## 2019-09-23 ENCOUNTER — Encounter: Payer: Self-pay | Admitting: Neurology

## 2019-09-23 MED ORDER — AMPHETAMINE-DEXTROAMPHETAMINE 20 MG PO TABS: ORAL_TABLET | ORAL | 0 refills | Status: DC

## 2019-09-23 NOTE — Telephone Encounter (Signed)
Pt has asked about restarting the adderall. Previously this was ordered through Dr Brett Fairy. I will forward the request to Dr Dohmeier to review and see If she is ok with refilling.

## 2019-10-18 ENCOUNTER — Ambulatory Visit (INDEPENDENT_AMBULATORY_CARE_PROVIDER_SITE_OTHER): Payer: Medicare Other | Admitting: Internal Medicine

## 2019-10-18 ENCOUNTER — Other Ambulatory Visit: Payer: Self-pay

## 2019-10-18 ENCOUNTER — Encounter (INDEPENDENT_AMBULATORY_CARE_PROVIDER_SITE_OTHER): Payer: Self-pay | Admitting: Internal Medicine

## 2019-10-18 VITALS — BP 120/88 | HR 84 | Ht 66.0 in | Wt 186.0 lb

## 2019-10-18 DIAGNOSIS — R6882 Decreased libido: Secondary | ICD-10-CM

## 2019-10-18 DIAGNOSIS — E559 Vitamin D deficiency, unspecified: Secondary | ICD-10-CM

## 2019-10-18 NOTE — Progress Notes (Signed)
Metrics: Intervention Frequency ACO  Documented Smoking Status Yearly  Screened one or more times in 24 months  Cessation Counseling or  Active cessation medication Past 24 months  Past 24 months   Guideline developer: UpToDate (See UpToDate for funding source) Date Released: 2014       Wellness Office Visit  Subjective:  Patient ID: Justin Carroll, male    DOB: 1955-05-06  Age: 64 y.o. MRN: [id number]  CC: This man comes in for follow-up of vitamin D deficiency, ADD, testosterone therapy. HPI  He now has been taking testosterone therapy once a week on a regular basis and his last dose was yesterday. He takes vitamin D3 2000 units daily. He went back to taking Adderall for his ADD as it seems to have helped him. He is also cutting back on some work that he is doing which I think frees  him up for other work that he likes to do. Past Medical History:  Diagnosis Date  . Depression    Past Surgical History:  Procedure Laterality Date  . TONSILLECTOMY     age 64     Family History  Problem Relation Age of Onset  . Colon cancer Neg Hx   . Pancreatic cancer Neg Hx   . Rectal cancer Neg Hx   . 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   Social History   Tobacco Use  . Smoking status: Former Smoker    Packs/day: 1.50    Years: 10.00    Pack years: 15.00    Start date: 1995    Quit date: 2015    Years since quitting: 6.7  . Smokeless tobacco: Never Used  Substance Use Topics  . Alcohol use: No    Current Meds  Medication Sig  . amphetamine-dextroamphetamine (ADDERALL) 20 MG tablet Take 1 tablet in the morning and additional tablet if needed at lunch  . buPROPion (WELLBUTRIN XL) 300 MG 24 hr tablet Take 1 tablet (300 mg total) by mouth daily.  . Cholecalciferol (VITAMIN D-3) 25 MCG (1000 UT) CAPS Take 2 capsules by mouth daily.  Marland Kitchen levocetirizine (XYZAL) 5 MG tablet Take 5 mg by mouth every evening.  .  pantoprazole (PROTONIX) 40 MG tablet Take 1 tablet (40 mg total) by mouth daily.  . tamsulosin (FLOMAX) 0.4 MG CAPS capsule Take 1 capsule (0.4 mg total) by mouth daily after supper.  . testosterone cypionate (DEPO-TESTOSTERONE) 200 MG/ML injection Inject 0.5 mLs (100 mg total) into the muscle every 7 (seven) days.  . traZODone (DESYREL) 100 MG tablet TAKE 1 TABLET BY MOUTH ONCE DAILY AT NIGHT      No flowsheet data found.   Objective:   Today's Vitals: BP 120/88   Pulse 84   Ht 5\' 6"  (1.676 m)   Wt 186 lb (84.4 kg)   SpO2 98%   BMI 30.02 kg/m  Vitals with BMI 10/18/2019 09/14/2019 07/28/2019  Height 5\' 6"  5\' 7"  5\' 7"   Weight 186 lbs 187 lbs 186 lbs  BMI 30.04 64.33 29.51  Systolic 884 166 063  Diastolic 88 95 72  Pulse 84 84 74     Physical Exam  He looks systemically well.  His weight is stable.  Blood pressure is reasonable.  He is alert and orientated without any focal neurological signs.     Assessment   1. Decreased libido   2. Vitamin D deficiency disease  Tests ordered Orders Placed This Encounter  Procedures  . COMPLETE METABOLIC PANEL WITH GFR  . VITAMIN D 25 Hydroxy (Vit-D Deficiency, Fractures)  . Testosterone Total,Free,Bio, Males     Plan: 1. He will continue current testosterone dose and we will check levels today. 2. He will continue with vitamin D3 2000 units daily and I will check levels today.  I suspect we may need to increase the dose. 3. Further recommendations will depend on blood results and I will follow him up in 3 months time.   No orders of the defined types were placed in this encounter.   Doree Albee, MD

## 2019-10-19 LAB — TESTOSTERONE TOTAL,FREE,BIO, MALES
Albumin: 4.6 g/dL (ref 3.6–5.1)
Sex Hormone Binding: 30 nmol/L (ref 22–77)
Testosterone, Bioavailable: 76.6 ng/dL — ABNORMAL LOW (ref >=110.0)
Testosterone, Free: 36.5 pg/mL — ABNORMAL LOW (ref 46.0–224.0)
Testosterone: 271 ng/dL (ref 250–827)

## 2019-10-19 LAB — COMPLETE METABOLIC PANEL WITH GFR
AG Ratio: 1.8 (calc) (ref 1.0–2.5)
ALT: 19 U/L (ref 9–46)
AST: 14 U/L (ref 10–35)
Albumin: 4.6 g/dL (ref 3.6–5.1)
Alkaline phosphatase (APISO): 95 U/L (ref 35–144)
BUN: 19 mg/dL (ref 7–25)
CO2: 27 mmol/L (ref 20–32)
Calcium: 9.7 mg/dL (ref 8.6–10.3)
Chloride: 104 mmol/L (ref 98–110)
Creat: 1.17 mg/dL (ref 0.70–1.25)
GFR, Est African American: 75 mL/min/{1.73_m2} (ref >=60)
GFR, Est Non African American: 65 mL/min/{1.73_m2} (ref >=60)
Globulin: 2.5 g/dL (calc) (ref 1.9–3.7)
Glucose, Bld: 109 mg/dL — ABNORMAL HIGH (ref 65–99)
Potassium: 4.7 mmol/L (ref 3.5–5.3)
Sodium: 141 mmol/L (ref 135–146)
Total Bilirubin: 0.4 mg/dL (ref 0.2–1.2)
Total Protein: 7.1 g/dL (ref 6.1–8.1)

## 2019-10-19 LAB — VITAMIN D 25 HYDROXY (VIT D DEFICIENCY, FRACTURES): Vit D, 25-Hydroxy: 52 ng/mL (ref 30–100)

## 2019-10-20 ENCOUNTER — Encounter (INDEPENDENT_AMBULATORY_CARE_PROVIDER_SITE_OTHER): Payer: Self-pay | Admitting: Internal Medicine

## 2019-10-20 ENCOUNTER — Encounter: Payer: Self-pay | Admitting: Family Medicine

## 2019-10-20 ENCOUNTER — Ambulatory Visit (INDEPENDENT_AMBULATORY_CARE_PROVIDER_SITE_OTHER): Payer: Medicare Other | Admitting: Family Medicine

## 2019-10-20 VITALS — BP 128/84 | HR 74 | Ht 66.0 in | Wt 186.0 lb

## 2019-10-20 DIAGNOSIS — R41840 Attention and concentration deficit: Secondary | ICD-10-CM | POA: Diagnosis not present

## 2019-10-20 NOTE — Progress Notes (Signed)
PATIENT: Justin Carroll DOB: [birth year]  REASON FOR VISIT: follow up HISTORY FROM: patient  Chief Complaint  Patient presents with  . Follow-up    6 month f/u, states he has been doing well since last visit.   Marland Kitchen room 1    alone     HISTORY OF PRESENT ILLNESS: Today 10/20/19 Justin Carroll is a 64 y.o. male here today for follow up for memory loss. He was last seen in 04/2019 with normal MOCA. Question of dementia versus ADD. He discontinued Aricept 10mg  per PCP direction. Adderall was added in March, 2021. He felt that extended release made him more irritable. He switched to IR dosage and feels that it worked better. He notes a significant difference in ability to focus and concentrate. He stopped taking Adderall and noted a clear worsening in attention. He feels that memory is stable. 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 He feels that with less cases he will have time to focus and, in turn, decrease stress levels.    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  Evaluation of Memory.  He is accompanied by his wife. He has not gotten lost , he has not had a fall since last visit! Still grouchy, but he is doing better with orientation, his work flow had been restructured. He has resigned form jury trials after our first meeting. This reduced his stress level. He mentioned his stress level in court rose with COVID, the jury trials on [place].  He is sleeping well. He is reportedly a recovering alcoholic for 33 years , no medication , no drugs, he is on Wellbutrin. He is breathing better,, following a nasal septal surgery on march 4th. Recovering well. [name].   His PET scan was normal (!) and his MOCA has a normal result today , too. We may not deal with a dementia at all. 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 Neuropsychological testing is pending. For JUNE (!)- Dr. [name].  Will see if he can see the new [facility] neuropsychologist.  His wife tends to attribute his last years memory decline and recovery to reducing stress and distractions. He may have adult ADD. ADHD ? I will give a try to Ritalin.  He stays on Aricept now 10 mg. requests a 90 day supply.   CD    Dr. Anastasio Champion  added that the patient had come somewhat more irritable at times and his wife used the term "grouchy". He has not lost awareness or consciousness with any of his falls they don't seem to be syncope or seizures but they're sudden. He denies any lightheadedness or vertigo. He has not felt clumsy whilst walking and his speech has not changed he has also asked for an ENT referral as he has frequent sinus congestion. He has seen Dr Lucia Gaskins, he will do a septal deviation repair.   The family history is positive for a father with coronary artery disease, pulmonary disease/ COPD , who was heavy smoker. A brother was post mortem diagnosed with vascular dementia.   There may have been a progression slowly over the last 3 years or so of memory loss being noticeable but over the last 6 months the family has noted a significant progression. Especially with getting lost  on a familiar road on a routine drive. He has a completely disorganized office, he has misfiled papers, lost papers. His Network engineer and his wife have kept him from taking charts home.   He has impaired depth perception, has trouble parking a larger vehicle, but stated he has no trouble reading.  He reportedly has been unimpaired when it comes to accounting, but this is mostly done by his wife.   He has very good and very bad days, is usually better in the morning when it comes to cognitive performance. He has lost words, skips sometimes words, non-fluent - he makes more small talk than actual conversation.  He denies  nocturnal activity, acting out dreams, but is a restless sleeper.   An MRI brain without contrast did not reveal abnormalities. [name] ordered.  Part of our exam today was a Montreal cognitive assessment , abbreviated MOCA, and Justin. Panther scored 24 out of 30 points , placing him just at the borderline between a mild cognitive impairment and a beginning dementia.  There was impairment with visual-spatial problem-solving to copy a cube was difficult he could name three animals could repeat words and a series of letters he was very good for subtraction and mathematics he remembered 3 out of 5 delay words. His orientation to place and time was completely intact.  Montreal Cognitive Assessment  04/19/2019 02/18/2019  Visuospatial/ Executive (0/5) 4 3  Naming (0/3) 3 3  Attention: Read list of digits (0/2) 2 2  Attention: Read list of letters (0/1) 1 1  Attention: Serial 7 subtraction starting at 100 (0/3) 3 3  Language: Repeat phrase (0/2) 1 2  Language : Fluency (0/1) 1 1  Abstraction (0/2) 2 2  Delayed Recall (0/5) 5 3  Orientation (0/6) 6 6  Total 28 26      REVIEW OF SYSTEMS: Out of a complete 14 system review of symptoms, the patient complains only of the following symptoms, inattention, anxiety and all other reviewed systems are negative.  ALLERGIES: No Known Allergies  HOME MEDICATIONS: Outpatient Medications Prior to Visit  Medication Sig Dispense Refill  . amphetamine-dextroamphetamine (ADDERALL) 20 MG tablet Take 1 tablet in the morning and additional tablet if needed at lunch 60 tablet 0  . buPROPion (WELLBUTRIN XL) 300 MG 24 hr tablet Take 1 tablet (300 mg total) by mouth daily. 90 tablet 0  . Cholecalciferol (VITAMIN D-3) 25 MCG (1000 UT) CAPS Take 2 capsules by mouth daily.    Marland Kitchen levocetirizine (XYZAL) 5 MG tablet Take 5 mg by mouth every evening.    . pantoprazole (PROTONIX) 40 MG tablet Take 1 tablet (40 mg total) by mouth daily. 90 tablet 0  . tamsulosin (FLOMAX)  0.4 MG CAPS capsule Take 1 capsule (0.4 mg total) by mouth daily after supper. 90 capsule 0  . testosterone cypionate (DEPO-TESTOSTERONE) 200 MG/ML injection Inject 0.5 mLs (100 mg total) into the muscle every 7 (seven) days. 10 mL 0  . traZODone (DESYREL) 100 MG tablet TAKE 1 TABLET BY MOUTH ONCE DAILY AT NIGHT 90 tablet 0   No facility-administered medications prior to visit.    PAST MEDICAL HISTORY: Past Medical History:  Diagnosis Date  . Depression     PAST SURGICAL HISTORY: Past Surgical History:  Procedure Laterality Date  . TONSILLECTOMY     age 12    FAMILY HISTORY: Family History  Problem Relation Age of Onset  . Colon cancer Neg Hx   . Pancreatic cancer Neg Hx   .  Rectal cancer Neg Hx   . Stomach cancer Neg Hx     SOCIAL HISTORY: Social History   Socioeconomic History  . Marital status: Married    Spouse name: Not on file  . Number of children: Not on file  . Years of education: Not on file  . Highest education level: Not on file  Occupational History  . Not on file  Tobacco Use  . Smoking status: Former Smoker    Packs/day: 1.50    Years: 10.00    Pack years: 15.00    Start date: 1995    Quit date: 2015    Years since quitting: 6.7  . Smokeless tobacco: Never Used  Substance and Sexual Activity  . Alcohol use: No  . Drug use: No  . Sexual activity: Not on file  Other Topics Concern  . 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   Social Determinants of Health   Financial Resource Strain:   . Difficulty of Paying Living Expenses: Not on file  Food Insecurity:   . Worried About Charity fundraiser in the Last Year: Not on file  . Ran Out of Food in the Last Year: Not on file  Transportation Needs:   . Lack of Transportation (Medical): Not on file  . Lack of Transportation (Non-Medical): Not on file  Physical Activity:   . Days of Exercise per Week: Not on file  . Minutes of Exercise per Session: Not on file    Stress:   . Feeling of Stress : Not on file  Social Connections:   . Frequency of Communication with Friends and Family: Not on file  . Frequency of Social Gatherings with Friends and Family: Not on file  . Attends Religious Services: Not on file  . Active Member of Clubs or Organizations: Not on file  . Attends Archivist Meetings: Not on file  . Marital Status: Not on file  Intimate Partner Violence:   . Fear of Current or Ex-Partner: Not on file  . Emotionally Abused: Not on file  . Physically Abused: Not on file  . Sexually Abused: Not on file      PHYSICAL EXAM  Vitals:   10/20/19 1307  BP: 128/84  Pulse: 74  Weight: 186 lb (84.4 kg)  Height: 5\' 6"  (1.676 m)   Body mass index is 30.02 kg/m.  Generalized: Well developed, in no acute distress  Cardiology: normal rate and rhythm, no murmur noted Respiratory: clear to auscultation bilaterally  Neurological examination  Mentation: Alert oriented to time, place, history taking. Follows all commands speech and language fluent Cranial nerve II-XII: Pupils were equal round reactive to light. Extraocular movements were full, visual field were full  Motor: The motor testing reveals 5 over 5 strength of all 4 extremities. Good symmetric motor tone is noted throughout.  Gait and station: Gait is normal.    DIAGNOSTIC DATA (LABS, IMAGING, TESTING) - I reviewed patient records, labs, notes, testing and imaging myself where available.  No flowsheet data found.   Lab Results  Component Value Date   WBC 4.5 01/27/2019   HGB 16.0 01/27/2019   HCT 47.4 01/27/2019   MCV 91.9 01/27/2019   PLT 212 01/27/2019      Component Value Date/Time   NA 141 10/18/2019 0840   NA 139 02/18/2019 1508   K 4.7 10/18/2019 0840   CL 104 10/18/2019 0840   CO2 27 10/18/2019 0840   GLUCOSE  109 (H) 10/18/2019 0840   BUN 19 10/18/2019 0840   BUN 12 02/18/2019 1508   CREATININE 1.17 10/18/2019 0840   CALCIUM 9.7 10/18/2019 0840    PROT 7.1 10/18/2019 0840   PROT 7.1 02/18/2019 1508   ALBUMIN 4.7 02/18/2019 1508   AST 14 10/18/2019 0840   ALT 19 10/18/2019 0840   ALKPHOS 106 02/18/2019 1508   BILITOT 0.4 10/18/2019 0840   BILITOT 0.3 02/18/2019 1508   GFRNONAA 65 10/18/2019 0840   GFRAA 75 10/18/2019 0840   No results found for: CHOL, HDL, LDLCALC, LDLDIRECT, TRIG, CHOLHDL No results found for: HGBA1C Lab Results  Component Value Date   VITAMINB12 460 01/27/2019   Lab Results  Component Value Date   TSH 0.87 01/27/2019    Montreal Cognitive Assessment  10/20/2019 04/19/2019 02/18/2019  Visuospatial/ Executive (0/5) 5 4 3   Naming (0/3) 3 3 3   Attention: Read list of digits (0/2) 2 2 2   Attention: Read list of letters (0/1) 1 1 1   Attention: Serial 7 subtraction starting at 100 (0/3) 3 3 3   Language: Repeat phrase (0/2) 2 1 2   Language : Fluency (0/1) 1 1 1   Abstraction (0/2) 2 2 2   Delayed Recall (0/5) 2 5 3   Orientation (0/6) 6 6 6   Total 27 28 26      ASSESSMENT AND PLAN 64 y.o. year old male  has a past medical history of Depression. here with     ICD-10-CM   1. Cognitive attention deficit  R41.840     Justin Carroll is doing well, today. He has noted improvement in concentration, mood and memory since starting Adderall. Is is tolerating immediate release better with no irritability. We will continue Adderall 20mg  in am and he may take additional 10mg  in afternoons as needed. MOCA consistent with previous assessments 27/30. Most difficulty with recall. We have reviewed memory compensation strategies. Healthy lifestyle habits encouraged. I have encouraged him to consider pursuing neurocognitive testing with Dr Sima Matas, postponed due to wife's surgery. He will reach out to Dr Sima Matas to schedule. He will follow up with PCP regularly. He will follow up with [place] in 6 months.    No orders of the defined types were placed in this encounter.    No orders of the defined types were placed in this  encounter.     I spent 30 minutes with the patient. 50% of this time was spent counseling and educating patient on plan of care and medications.    Debbora Presto, FNP-C 10/20/2019, 1:57 PM [facility] [address], [place], [place] [address] [phone]

## 2019-10-20 NOTE — Patient Instructions (Signed)
  We will continue Adderall 20mg  every morning and 10mg  (1/2 tablet) in afternoon as needed.   Healthy lifestyle habits encouraged with well balanced meals, adequate hydration and regular exercise.   Follow up in 6 months   Memory Compensation Strategies  1. Use "WARM" strategy.  W= write it down  A= associate it  R= repeat it  M= make a mental note  2.   You can keep a Social worker.  Use a 3-ring notebook with sections for the following: calendar, important names and phone numbers,  medications, doctors' names/phone numbers, lists/reminders, and a section to journal what you did  each day.   3.    Use a calendar to write appointments down.  4.    Write yourself a schedule for the day.  This can be placed on the calendar or in a separate section of the Memory Notebook.  Keeping a  regular schedule can help memory.  5.    Use medication organizer with sections for each day or morning/evening pills.  You may need help loading it  6.    Keep a basket, or pegboard by the door.  Place items that you need to take out with you in the basket or on the pegboard.  You may also want to  include a message board for reminders.  7.    Use sticky notes.  Place sticky notes with reminders in a place where the task is performed.  For example: " turn off the  stove" placed by the stove, "lock the door" placed on the door at eye level, " take your medications" on  the bathroom mirror or by the place where you normally take your medications.  8.    Use alarms/timers.  Use while cooking to remind yourself to check on food or as a reminder to take your medicine, or as a  reminder to make a call, or as a reminder to perform another task, etc.

## 2019-10-26 ENCOUNTER — Other Ambulatory Visit (INDEPENDENT_AMBULATORY_CARE_PROVIDER_SITE_OTHER): Payer: Self-pay | Admitting: Internal Medicine

## 2019-10-26 DIAGNOSIS — Z9109 Other allergy status, other than to drugs and biological substances: Secondary | ICD-10-CM

## 2019-10-26 MED ORDER — TESTOSTERONE CYPIONATE 200 MG/ML IM SOLN: 100.0000 mg | INTRAMUSCULAR | 1 refills | Status: DC

## 2019-11-09 ENCOUNTER — Other Ambulatory Visit (INDEPENDENT_AMBULATORY_CARE_PROVIDER_SITE_OTHER): Payer: Self-pay | Admitting: Internal Medicine

## 2019-12-02 ENCOUNTER — Other Ambulatory Visit: Payer: Self-pay | Admitting: *Deleted

## 2019-12-02 ENCOUNTER — Encounter: Payer: Self-pay | Admitting: Family Medicine

## 2019-12-02 MED ORDER — AMPHETAMINE-DEXTROAMPHETAMINE 20 MG PO TABS: ORAL_TABLET | ORAL | 0 refills | Status: DC

## 2019-12-03 NOTE — Telephone Encounter (Signed)
I called pharmacy and pt received same medication last 09-24-19 as this prescription 12-02-19 (generic) same NDC.  Pt verbalized understanding.

## 2019-12-08 ENCOUNTER — Encounter: Payer: Self-pay | Admitting: Allergy & Immunology

## 2019-12-08 ENCOUNTER — Ambulatory Visit (INDEPENDENT_AMBULATORY_CARE_PROVIDER_SITE_OTHER): Payer: Medicare Other | Admitting: Allergy & Immunology

## 2019-12-08 ENCOUNTER — Other Ambulatory Visit: Payer: Self-pay

## 2019-12-08 VITALS — BP 148/96 | HR 78 | Temp 98.7°F | Resp 18 | Ht 66.5 in | Wt 186.0 lb

## 2019-12-08 DIAGNOSIS — J302 Other seasonal allergic rhinitis: Secondary | ICD-10-CM | POA: Diagnosis not present

## 2019-12-08 DIAGNOSIS — J3089 Other allergic rhinitis: Secondary | ICD-10-CM | POA: Diagnosis not present

## 2019-12-08 MED ORDER — MOMETASONE FUROATE 50 MCG/ACT NA SUSP: 1.0000 | Freq: Every day | NASAL | 5 refills | Status: AC

## 2019-12-08 MED ORDER — AZELASTINE HCL 0.1 % NA SOLN: 2.0000 | Freq: Two times a day (BID) | NASAL | 5 refills | Status: DC | PRN

## 2019-12-08 NOTE — Progress Notes (Signed)
NEW PATIENT  Date of Service/Encounter:  12/08/19  Referring provider: Doree Albee, MD   Assessment:   Chronic rhinitis (indoor molds, outdoor molds, dust mites, cat, dog and cockroach)  Plan/Recommendations:   1. Chronic rhinitis - Testing today showed: indoor molds, outdoor molds, dust mites, cat, dog and cockroach - Copy of test results provided.  - Avoidance measures provided (dust mite covers are probably the best thing you could do at this point and keeping your animals out of the bedroom) - Strongly consider getting a HEPA air purifier for your bedroom at least.  - Stop taking: Flonase - Continue with: Xyzal (levocetirizine) 5mg  tablet once daily - Start taking: Nasonex (momentason) one spray per nostril daily and Astelin (azelastine) 2 sprays per nostril 1-2 times daily as needed - You can use an extra dose of the antihistamine, if needed, for breakthrough symptoms.  - Consider nasal saline rinses 1-2 times daily to remove allergens from the nasal cavities as well as help with mucous clearance (this is especially helpful to do before the nasal sprays are given) - Consider allergy shots as a means of long-term control. - Allergy shots "re-train" and "reset" the immune system to ignore environmental allergens and decrease the resulting immune response to those allergens (sneezing, itchy watery eyes, runny nose, nasal congestion, etc).    - Allergy shots improve symptoms in 75-85% of patients.  - We can discuss more at the next appointment if the medications are not working for you.  2. Return in about 3 months (around 03/09/2020).  Subjective:   Justin Carroll is a 64 y.o. male presenting today for evaluation of  Chief Complaint  Patient presents with  . Allergic Rhinitis     runny nose, nasal congestion. he does use saline rinses, taking Xyzal which helps more than others.     Justin Carroll has a history of the following: Patient Active Problem List   Diagnosis  Date Noted  . Seasonal and perennial allergic rhinitis 12/08/2019  . Cognitive attention deficit 04/19/2019  . Memory loss, short term 02/18/2019  . Fall 02/18/2019  . Other frontotemporal dementia (CODE) ([place]) 02/18/2019  . Eye movement abnormality 02/18/2019  . 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  Justin Carroll was referred by Doree Albee, MD.     Justin Carroll is a 64 y.o. male presenting for an evaluation of chronic rhinitis.    Allergic Rhinitis Symptom History: He reports that he has always had allergies. He first took Actifed and he has taken a number of antihistamines over the years. He is using Xyzal now which is working better than anything else at this point. Ragweed season is typically the worst time of the year for him. He does think that he is allergic to dust mite and dander. He has a lot of eye irritation as well. He has used a nose spray in the past, with transient relief. He has used Flonase in the past. He has used Nasacort in the past.    His wife works in his office. She has a history in the mental health field. But she does a good job managing his office.   Otherwise, there is no history of other atopic diseases, including asthma, food allergies, drug allergies, stinging insect allergies, eczema, urticaria or contact dermatitis. There is no significant infectious history. Vaccinations are up to date.    Past Medical History: Patient Active Problem List   Diagnosis  Date Noted  . Seasonal and perennial allergic rhinitis 12/08/2019  . Cognitive attention deficit 04/19/2019  . Memory loss, short term 02/18/2019  . Fall 02/18/2019  . Other frontotemporal dementia (CODE) ([place]) 02/18/2019  . Eye movement abnormality 02/18/2019  . 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 If you have any questions, ask your nurse or doctor.        amphetamine-dextroamphetamine 20 MG tablet Commonly known as: Adderall Take 1 tablet in the morning and additional tablet if needed at lunch   azelastine 0.1 % nasal spray Commonly known as: ASTELIN Place 2 sprays into both nostrils 2 (two) times daily as needed for rhinitis. Started by: Valentina Shaggy, MD   buPROPion 300 MG 24 hr tablet Commonly known as: WELLBUTRIN XL Take 1 tablet by mouth once daily   mometasone 50 MCG/ACT nasal spray Commonly known as: NASONEX Place 1 spray into the nose daily. Started by: Valentina Shaggy, MD   pantoprazole 40 MG tablet Commonly known as: PROTONIX Take 1 tablet by mouth once daily   tamsulosin 0.4 MG Caps capsule Commonly known as: Flomax Take 1 capsule (0.4 mg total) by mouth daily after supper.   testosterone cypionate 200 MG/ML injection Commonly known as: Depo-Testosterone Inject 0.5 mLs (100 mg total) into the muscle 2 (two) times a week.   traZODone 100 MG tablet Commonly known as: DESYREL TAKE 1 TABLET BY MOUTH ONCE DAILY AT NIGHT   Vitamin D-3 25 MCG (1000 UT) Caps Take 2 capsules by mouth daily.   Xyzal 5 MG tablet Generic drug: levocetirizine Take 5 mg by mouth every evening.       Birth History: non-contributory  Developmental History: non-contributory  Past Surgical History: Past Surgical History:  Procedure Laterality Date  . ADENOIDECTOMY    . NASAL SEPTUM SURGERY    . SINOSCOPY    . TONSILLECTOMY     age 45     Family History: Family History  Problem Relation Age of Onset  . Allergic rhinitis Brother   . Colon cancer Neg Hx   . Pancreatic cancer Neg Hx   . Rectal cancer Neg Hx   . Stomach cancer Neg Hx      Social History: Justin Carroll lives at home with his wife and adopted 66 year old.  He was on hospice 64 years old.  There is hardwood in the main living areas and  carpeting in the room.  He has a heat pump for heat and central AC.  There are dogs, cats, and a rabbit.  There are no dust mite covers.  He does smoke in the car, but not in the house.  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  He has been doing this for 4 years.  There is no exposure to fumes, chemicals, or dust.  He does have a HEPA filter in the home.  He does not live near an interstate or industrial area.   Review of Systems  Constitutional: Negative.  Negative for chills, fever, malaise/fatigue and weight loss.  HENT: Positive for congestion. Negative for ear discharge and ear pain.        Positive for postnasal drip.   Eyes: Negative for pain, discharge and redness.  Respiratory: Negative for cough, sputum production, shortness of breath and  wheezing.   Cardiovascular: Negative.  Negative for chest pain and palpitations.  Gastrointestinal: Negative for abdominal pain, constipation, diarrhea, heartburn, nausea and vomiting.  Skin: Negative.  Negative for itching and rash.  Neurological: Negative for dizziness and headaches.  Endo/Heme/Allergies: Negative for environmental allergies. Does not bruise/bleed easily.       Objective:   Blood pressure (!) 148/96, pulse 78, temperature 98.7 F (37.1 C), temperature source Oral, resp. rate 18, height 5' 6.5" (1.689 m), weight 186 lb (84.4 kg), SpO2 94 %. Body mass index is 29.57 kg/m.   Physical Exam:   Physical Exam Constitutional:      Appearance: Normal appearance. He is well-developed.     Comments: Pleasant male. Talkative.   HENT:     Head: Normocephalic and atraumatic.     Right Ear: Tympanic membrane, ear canal and external ear normal. No drainage, swelling or tenderness. Tympanic membrane is not injected, scarred, erythematous, retracted or bulging.     Left Ear: Tympanic membrane, ear canal and external ear normal. No drainage, swelling or tenderness. Tympanic membrane is not injected, scarred,  erythematous, retracted or bulging.     Nose: No nasal deformity, septal deviation, mucosal edema or rhinorrhea.     Right Turbinates: Enlarged and swollen.     Left Turbinates: Enlarged and swollen.     Right Sinus: No maxillary sinus tenderness or frontal sinus tenderness.     Left Sinus: No maxillary sinus tenderness or frontal sinus tenderness.     Comments: No polyps present. He does have some postsurgical changes from a nasal septum fix earlier this month.    Mouth/Throat:     Mouth: Mucous membranes are not pale and not dry.     Pharynx: Uvula midline.     Comments: No cobblestoning present in the posterior oropharynx.  Eyes:     General:        Right eye: No discharge.        Left eye: No discharge.     Conjunctiva/sclera: Conjunctivae normal.     Right eye: Right conjunctiva is not injected. No chemosis.    Left eye: Left conjunctiva is not injected. No chemosis.    Pupils: Pupils are equal, round, and reactive to light.  Cardiovascular:     Rate and Rhythm: Normal rate and regular rhythm.     Heart sounds: Normal heart sounds.  Pulmonary:     Effort: Pulmonary effort is normal. No tachypnea, accessory muscle usage or respiratory distress.     Breath sounds: Normal breath sounds. No wheezing, rhonchi or rales.     Comments: Moving air well in all lung fields.  Chest:     Chest wall: No tenderness.  Abdominal:     Tenderness: There is no abdominal tenderness. There is no guarding or rebound.  Lymphadenopathy:     Head:     Right side of head: No submandibular, tonsillar or occipital adenopathy.     Left side of head: No submandibular, tonsillar or occipital adenopathy.     Cervical: No cervical adenopathy.  Skin:    General: Skin is warm.     Capillary Refill: Capillary refill takes less than 2 seconds.     Coloration: Skin is not pale.     Findings: No abrasion, erythema, petechiae or rash. Rash is not papular, urticarial or vesicular.     Comments: No eczematous or  urticarial lesions noted.   Neurological:     Mental Status: He is alert.  Psychiatric:  Behavior: Behavior is cooperative.      Diagnostic studies:   Allergy Studies:     Airborne Adult Perc - 12/08/19 0953    Time Antigen Placed 0930    Allergen Manufacturer Lavella Hammock    Location Back    Number of Test 60    Panel 1 Select    1. Control-Buffer 50% Glycerol Negative    2. Control-Histamine 1 mg/ml 2+    3. Albumin saline Negative    4. [place] Negative    5. [place] Negative    6. Johnson Negative    7. [place] Blue Negative    8. Meadow Fescue Negative    9. Perennial Rye Negative    10. Sweet Vernal Negative    11. Timothy Negative    12. Cocklebur Negative    13. Burweed Marshelder Negative    14. Ragweed, short Negative    15. Ragweed, Giant Negative    16. Plantain,  English Negative    17. Lamb's Quarters Negative    18. Sheep Sorrell Negative    19. Rough Pigweed Negative    20. Marsh Elder, Rough Negative    21. Mugwort, Common Negative    22. Ash mix Negative    23. Birch mix Negative    24. Beech American Negative    25. Box, Elder Negative    26. Cedar, red Negative    27. Cottonwood, [place] Negative    28. Elm mix Negative    29. Hickory Negative    30. Maple mix Negative    31. Oak, [place] mix Negative    32. Pecan Pollen Negative    33. Pine mix Negative    34. Sycamore Eastern Negative    35. Watson, Black Pollen Negative    36. Alternaria alternata Negative    37. Cladosporium Herbarum Negative    38. Aspergillus mix Negative    39. Penicillium mix Negative    40. Bipolaris sorokiniana (Helminthosporium) Negative    41. Drechslera spicifera (Curvularia) Negative    42. Mucor plumbeus Negative    43. Fusarium moniliforme Negative    44. Aureobasidium pullulans (pullulara) Negative    45. Rhizopus oryzae Negative    46. Botrytis cinera Negative    47. Epicoccum nigrum Negative    48. Phoma betae Negative    49. Candida Albicans  Negative    50. Trichophyton mentagrophytes Negative    51. Mite, D Farinae  5,000 AU/ml 2+    52. Mite, D Pteronyssinus  5,000 AU/ml 2+    53. Cat Hair 10,000 BAU/ml Negative    54.  Dog Epithelia Negative    55. Mixed Feathers Negative    56. Horse Epithelia Negative    57. Cockroach, German Negative    58. Mouse Negative    59. Tobacco Leaf Negative    Other --   Rabbit: negative         Intradermal - 12/08/19 0900    Time Antigen Placed 1000    Allergen Manufacturer Lavella Hammock    Location Arm    Number of Test 14    Control Negative    [place] Negative    Johnson Negative    7 Grass Negative    Ragweed mix Negative    Weed mix Negative    Tree mix Negative    Mold 1 Negative    Mold 2 2+    Mold 3 2+    Mold 4 2+    Cat 1+  Dog 1+    Cockroach 2+           Allergy testing results were read and interpreted by myself, documented by clinical staff.         Salvatore Marvel, MD [facility]

## 2019-12-08 NOTE — Patient Instructions (Addendum)
1. Chronic rhinitis - Testing today showed: indoor molds, outdoor molds, dust mites, cat, dog and cockroach - Copy of test results provided.  - Avoidance measures provided (dust mite covers are probably the best thing you could do at this point and keeping your animals out of the bedroom) - Strongly consider getting a HEPA air purifier for your bedroom at least.  - Stop taking: Flonase - Continue with: Xyzal (levocetirizine) 5mg  tablet once daily - Start taking: Nasonex (momentason) one spray per nostril daily and Astelin (azelastine) 2 sprays per nostril 1-2 times daily as needed - You can use an extra dose of the antihistamine, if needed, for breakthrough symptoms.  - Consider nasal saline rinses 1-2 times daily to remove allergens from the nasal cavities as well as help with mucous clearance (this is especially helpful to do before the nasal sprays are given) - Consider allergy shots as a means of long-term control. - Allergy shots "re-train" and "reset" the immune system to ignore environmental allergens and decrease the resulting immune response to those allergens (sneezing, itchy watery eyes, runny nose, nasal congestion, etc).    - Allergy shots improve symptoms in 75-85% of patients.  - We can discuss more at the next appointment if the medications are not working for you.  2. Return in about 3 months (around 03/09/2020).   Please inform us of any Emergency Department visits, hospitalizations, or changes in symptoms. Call us before going to the ED for breathing or allergy symptoms since we might be able to fit you in for a sick visit. Feel free to contact us anytime with any questions, problems, or concerns.  It was a pleasure to meet you today!  Websites that have reliable patient information: 1. American Academy of Asthma, Allergy, and Immunology: www.aaaai.org 2. Food Allergy Research and Education (FARE): foodallergy.org 3. Mothers of Asthmatics:  http://www.asthmacommunitynetwork.org 4. American College of Allergy, Asthma, and Immunology: www.acaai.org   COVID-19 Vaccine Information can be found at: ShippingScam.co.uk For questions related to vaccine distribution or appointments, please email [email or web address] or call [phone].     "Like" [place] on Facebook and Instagram for our latest updates!     HAPPY FALL!     Make sure you are registered to vote! If you have moved or changed any of your contact information, you will need to get this updated before voting!  In some cases, you MAY be able to register to vote online: [email or web address]    Control of [place] and fungi can grow on a variety of surfaces provided certain temperature and moisture conditions exist.  Outdoor molds grow on plants, decaying vegetation and soil.  The major outdoor mold, Alternaria and Cladosporium, are found in very high numbers during hot and dry conditions.  Generally, a late Summer - Fall peak is seen for common outdoor fungal spores.  Rain will temporarily lower outdoor mold spore count, but counts rise rapidly when the rainy period ends.  The most important indoor molds are Aspergillus and Penicillium.  Dark, humid and poorly ventilated basements are ideal sites for mold growth.  The next most common sites of mold growth are the bathroom and the kitchen.  Outdoor (Seasonal) Mold Control  Positive outdoor molds via skin testing: Bipolaris (Helminthsporium), Drechslera (Curvalaria) and Mucor  1. Use air conditioning and keep windows closed 2. Avoid exposure to decaying vegetation. 3. Avoid leaf raking. 4. Avoid grain handling. 5. Consider wearing a face mask if working in moldy areas.  6.  Indoor (Perennial) Mold Control   Positive indoor molds via skin testing: Aspergillus, Penicillium, Fusarium, Aureobasidium (Pullulara) and  Rhizopus  1. Maintain humidity below 50%. 2. Clean washable surfaces with 5% bleach solution. 3. Remove sources e.g. contaminated carpets.     Control of Dust Mite Allergen    Dust mites play a major role in allergic asthma and rhinitis.  They occur in environments with high humidity wherever human skin is found.  Dust mites absorb humidity from the atmosphere (ie, they do not drink) and feed on organic matter (including shed human and animal skin).  Dust mites are a microscopic type of insect that you cannot see with the naked eye.  High levels of dust mites have been detected from mattresses, pillows, carpets, upholstered furniture, bed covers, clothes, soft toys and any woven material.  The principal allergen of the dust mite is found in its feces.  A gram of dust may contain 1,000 mites and 250,000 fecal particles.  Mite antigen is easily measured in the air during house cleaning activities.  Dust mites do not bite and do not cause harm to humans, other than by triggering allergies/asthma.    Ways to decrease your exposure to dust mites in your home:  1. Encase mattresses, box springs and pillows with a mite-impermeable barrier or cover   2. Wash sheets, blankets and drapes weekly in hot water (130 F) with detergent and dry them in a dryer on the hot setting.  3. Have the room cleaned frequently with a vacuum cleaner and a damp dust-mop.  For carpeting or rugs, vacuuming with a vacuum cleaner equipped with a high-efficiency particulate air (HEPA) filter.  The dust mite allergic individual should not be in a room which is being cleaned and should wait 1 hour after cleaning before going into the room. 4. Do not sleep on upholstered furniture (eg, couches).   5. If possible removing carpeting, upholstered furniture and drapery from the home is ideal.  Horizontal blinds should be eliminated in the rooms where the person spends the most time (bedroom, study, television room).  Washable vinyl,  roller-type shades are optimal. 6. Remove all non-washable stuffed toys from the bedroom.  Wash stuffed toys weekly like sheets and blankets above.   7. Reduce indoor humidity to less than 50%.  Inexpensive humidity monitors can be purchased at most hardware stores.  Do not use a humidifier as can make the problem worse and are not recommended.  Control of Dog or Cat Allergen  Avoidance is the best way to manage a dog or cat allergy. If you have a dog or cat and are allergic to dog or cats, consider removing the dog or cat from the home. If you have a dog or cat but don't want to find it a new home, or if your family wants a pet even though someone in the household is allergic, here are some strategies that may help keep symptoms at bay:  1. Keep the pet out of your bedroom and restrict it to only a few rooms. Be advised that keeping the dog or cat in only one room will not limit the allergens to that room. 2. Don't pet, hug or kiss the dog or cat; if you do, wash your hands with soap and water. 3. High-efficiency particulate air (HEPA) cleaners run continuously in a bedroom or living room can reduce allergen levels over time. 4. Regular use of a high-efficiency vacuum cleaner or a central vacuum can reduce allergen levels.  5. Giving your dog or cat a bath at least once a week can reduce airborne allergen.  Control of Cockroach Allergen  Cockroach allergen has been identified as an important cause of acute attacks of asthma, especially in urban settings.  There are fifty-five species of cockroach that exist in the [place], however only three, the [place], [place] species produce allergen that can affect patients with Asthma.  Allergens can be obtained from fecal particles, egg casings and secretions from cockroaches.    1. Remove food sources. 2. Reduce access to water. 3. Seal access and entry points. 4. Spray runways with 0.5-1% Diazinon or Chlorpyrifos 5. Blow boric acid  power under stoves and refrigerator. 6. Place bait stations (hydramethylnon) at feeding sites.  Allergy Shots   Allergies are the result of a chain reaction that starts in the immune system. Your immune system controls how your body defends itself. For instance, if you have an allergy to pollen, your immune system identifies pollen as an invader or allergen. Your immune system overreacts by producing antibodies called Immunoglobulin E (IgE). These antibodies travel to cells that release chemicals, causing an allergic reaction.  The concept behind allergy immunotherapy, whether it is received in the form of shots or tablets, is that the immune system can be desensitized to specific allergens that trigger allergy symptoms. Although it requires time and patience, the payback can be long-term relief.  How Do Allergy Shots Work?  Allergy shots work much like a vaccine. Your body responds to injected amounts of a particular allergen given in increasing doses, eventually developing a resistance and tolerance to it. Allergy shots can lead to decreased, minimal or no allergy symptoms.  There generally are two phases: build-up and maintenance. Build-up often ranges from three to six months and involves receiving injections with increasing amounts of the allergens. The shots are typically given once or twice a week, though more rapid build-up schedules are sometimes used.  The maintenance phase begins when the most effective dose is reached. This dose is different for each person, depending on how allergic you are and your response to the build-up injections. Once the maintenance dose is reached, there are longer periods between injections, typically two to four weeks.  Occasionally doctors give cortisone-type shots that can temporarily reduce allergy symptoms. These types of shots are different and should not be confused with allergy immunotherapy shots.  Who Can Be Treated with Allergy Shots?  Allergy  shots may be a good treatment approach for people with allergic rhinitis (hay fever), allergic asthma, conjunctivitis (eye allergy) or stinging insect allergy.   Before deciding to begin allergy shots, you should consider:  . The length of allergy season and the severity of your symptoms . Whether medications and/or changes to your environment can control your symptoms . Your desire to avoid long-term medication use . Time: allergy immunotherapy requires a major time commitment . Cost: may vary depending on your insurance coverage  Allergy shots for children age 74 and older are effective and often well tolerated. They might prevent the onset of new allergen sensitivities or the progression to asthma.  Allergy shots are not started on patients who are pregnant but can be continued on patients who become pregnant while receiving them. In some patients with other medical conditions or who take certain common medications, allergy shots may be of risk. It is important to mention other medications you talk to your allergist.   When Will I Feel Better?  Some may  experience decreased allergy symptoms during the build-up phase. For others, it may take as long as 12 months on the maintenance dose. If there is no improvement after a year of maintenance, your allergist will discuss other treatment options with you.  If you aren't responding to allergy shots, it may be because there is not enough dose of the allergen in your vaccine or there are missing allergens that were not identified during your allergy testing. Other reasons could be that there are high levels of the allergen in your environment or major exposure to non-allergic triggers like tobacco smoke.  What Is the Length of Treatment?  Once the maintenance dose is reached, allergy shots are generally continued for three to five years. The decision to stop should be discussed with your allergist at that time. Some people may experience a  permanent reduction of allergy symptoms. Others may relapse and a longer course of allergy shots can be considered.  What Are the Possible Reactions?  The two types of adverse reactions that can occur with allergy shots are local and systemic. Common local reactions include very mild redness and swelling at the injection site, which can happen immediately or several hours after. A systemic reaction, which is less common, affects the entire body or a particular body system. They are usually mild and typically respond quickly to medications. Signs include increased allergy symptoms such as sneezing, a stuffy nose or hives.  Rarely, a serious systemic reaction called anaphylaxis can develop. Symptoms include swelling in the throat, wheezing, a feeling of tightness in the chest, nausea or dizziness. Most serious systemic reactions develop within 30 minutes of allergy shots. This is why it is strongly recommended you wait in your doctor's office for 30 minutes after your injections. Your allergist is trained to watch for reactions, and his or her staff is trained and equipped with the proper medications to identify and treat them.  Who Should Administer Allergy Shots?  The preferred location for receiving shots is your prescribing allergist's office. Injections can sometimes be given at another facility where the physician and staff are trained to recognize and treat reactions, and have received instructions by your prescribing allergist.

## 2020-01-19 ENCOUNTER — Ambulatory Visit (INDEPENDENT_AMBULATORY_CARE_PROVIDER_SITE_OTHER): Payer: Medicare Other | Admitting: Internal Medicine

## 2020-01-26 ENCOUNTER — Encounter: Payer: Self-pay | Admitting: Neurology

## 2020-01-26 ENCOUNTER — Other Ambulatory Visit: Payer: Self-pay | Admitting: *Deleted

## 2020-01-26 MED ORDER — AMPHETAMINE-DEXTROAMPHETAMINE 20 MG PO TABS: ORAL_TABLET | ORAL | 0 refills | Status: DC

## 2020-01-26 NOTE — Progress Notes (Signed)
The Adderall will be refilled. 

## 2020-02-15 ENCOUNTER — Encounter (INDEPENDENT_AMBULATORY_CARE_PROVIDER_SITE_OTHER): Payer: Self-pay | Admitting: Internal Medicine

## 2020-02-15 ENCOUNTER — Ambulatory Visit (INDEPENDENT_AMBULATORY_CARE_PROVIDER_SITE_OTHER): Payer: Medicare Other | Admitting: Internal Medicine

## 2020-02-15 ENCOUNTER — Other Ambulatory Visit: Payer: Self-pay

## 2020-02-15 VITALS — BP 124/82 | HR 88 | Temp 98.1°F | Ht 66.5 in | Wt 180.4 lb

## 2020-02-15 DIAGNOSIS — E559 Vitamin D deficiency, unspecified: Secondary | ICD-10-CM | POA: Diagnosis not present

## 2020-02-15 DIAGNOSIS — F988 Other specified behavioral and emotional disorders with onset usually occurring in childhood and adolescence: Secondary | ICD-10-CM | POA: Diagnosis not present

## 2020-02-15 DIAGNOSIS — R6882 Decreased libido: Secondary | ICD-10-CM

## 2020-02-15 MED ORDER — BUPROPION HCL ER (XL) 300 MG PO TB24: 300.0000 mg | ORAL_TABLET | Freq: Every day | ORAL | 0 refills | Status: DC

## 2020-02-15 MED ORDER — TRAZODONE HCL 100 MG PO TABS: 100.0000 mg | ORAL_TABLET | Freq: Every day | ORAL | 0 refills | Status: DC

## 2020-02-15 MED ORDER — PANTOPRAZOLE SODIUM 40 MG PO TBEC: 40.0000 mg | DELAYED_RELEASE_TABLET | Freq: Every day | ORAL | 0 refills | Status: DC

## 2020-02-15 MED ORDER — TAMSULOSIN HCL 0.4 MG PO CAPS: 0.4000 mg | ORAL_CAPSULE | Freq: Every day | ORAL | 0 refills | Status: DC

## 2020-02-15 NOTE — Progress Notes (Signed)
Metrics: Intervention Frequency ACO  Documented Smoking Status Yearly  Screened one or more times in 24 months  Cessation Counseling or  Active cessation medication Past 24 months  Past 24 months   Guideline developer: UpToDate (See UpToDate for funding source) Date Released: 2014       Wellness Office Visit  Subjective:  Patient ID: Justin Carroll, male    DOB: August 18, 1955  Age: 65 y.o. MRN: [id number]  CC: This very pleasant man comes in for follow-up regarding testosterone therapy, ADD, vitamin D deficiency. HPI  Overall, he is doing reasonably well.  He has tolerated taking testosterone therapy twice a week.  He continues with vitamin D3' for vitamin D deficiency. Past Medical History:  Diagnosis Date  . Depression   . Sinus infection    yearly   Past Surgical History:  Procedure Laterality Date  . ADENOIDECTOMY    . NASAL SEPTUM SURGERY    . SINOSCOPY    . TONSILLECTOMY     age 21     Family History  Problem Relation Age of Onset  . Allergic rhinitis Brother   . Colon cancer Neg Hx   . Pancreatic cancer Neg Hx   . Rectal cancer Neg Hx   . 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   Social History   Tobacco Use  . Smoking status: Former Smoker    Packs/day: 1.50    Years: 10.00    Pack years: 15.00    Start date: 1995    Quit date: 2015    Years since quitting: 7.0  . Smokeless tobacco: Never Used  Substance Use Topics  . Alcohol use: No    Current Meds  Medication Sig  . amphetamine-dextroamphetamine (ADDERALL) 20 MG tablet Take 1 tablet in the morning and additional tablet if needed at lunch  . azelastine (ASTELIN) 0.1 % nasal spray Place 2 sprays into both nostrils 2 (two) times daily as needed for rhinitis.  . Cholecalciferol (VITAMIN D-3) 25 MCG (1000 UT) CAPS Take 2 capsules by mouth daily.  Marland Kitchen levocetirizine (XYZAL) 5 MG tablet Take 5 mg by mouth every evening.  . mometasone (NASONEX)  50 MCG/ACT nasal spray Place 1 spray into the nose daily.  Marland Kitchen testosterone cypionate (DEPO-TESTOSTERONE) 200 MG/ML injection Inject 0.5 mLs (100 mg total) into the muscle 2 (two) times a week.  . [DISCONTINUED] buPROPion (WELLBUTRIN XL) 300 MG 24 hr tablet Take 1 tablet by mouth once daily  . [DISCONTINUED] pantoprazole (PROTONIX) 40 MG tablet Take 1 tablet by mouth once daily  . [DISCONTINUED] tamsulosin (FLOMAX) 0.4 MG CAPS capsule Take 1 capsule (0.4 mg total) by mouth daily after supper.  . [DISCONTINUED] traZODone (DESYREL) 100 MG tablet TAKE 1 TABLET BY MOUTH ONCE DAILY AT NIGHT      No flowsheet data found.   Objective:   Today's Vitals: BP 124/82   Pulse 88   Temp 98.1 F (36.7 C) (Temporal)   Ht 5' 6.5" (1.689 m)   Wt 180 lb 6.4 oz (81.8 kg)   SpO2 97%   BMI 28.68 kg/m  Vitals with BMI 02/15/2020 12/08/2019 10/20/2019  Height 5' 6.5" 5' 6.5" 5\' 6"   Weight 180 lbs 6 oz 186 lbs 186 lbs  BMI 28.68 58.52 77.82  Systolic 423 536 144  Diastolic 82 96 84  Pulse 88 78 74     Physical Exam Looks systemically well.  He has lost 6  pounds in weight since last visit.  Blood pressure is acceptable.  Alert and orientated without any focal neurological signs.      Assessment   1. Decreased libido   2. Attention deficit disorder, unspecified hyperactivity presence   3. Vitamin D deficiency disease       Tests ordered No orders of the defined types were placed in this encounter.    Plan: 1. He will continue with testosterone therapy as before. 2. I have refilled several of his medications today. 3. I will see him in about 4 months and we will do blood work then.   Meds ordered this encounter  Medications  . buPROPion (WELLBUTRIN XL) 300 MG 24 hr tablet    Sig: Take 1 tablet (300 mg total) by mouth daily.    Dispense:  90 tablet    Refill:  0  . pantoprazole (PROTONIX) 40 MG tablet    Sig: Take 1 tablet (40 mg total) by mouth daily.    Dispense:  90 tablet     Refill:  0  . tamsulosin (FLOMAX) 0.4 MG CAPS capsule    Sig: Take 1 capsule (0.4 mg total) by mouth daily after supper.    Dispense:  90 capsule    Refill:  0  . traZODone (DESYREL) 100 MG tablet    Sig: Take 1 tablet (100 mg total) by mouth at bedtime.    Dispense:  90 tablet    Refill:  0    Ahmira Boisselle Luther Parody, MD

## 2020-02-28 ENCOUNTER — Other Ambulatory Visit (INDEPENDENT_AMBULATORY_CARE_PROVIDER_SITE_OTHER): Payer: Self-pay | Admitting: Internal Medicine

## 2020-02-28 DIAGNOSIS — L989 Disorder of the skin and subcutaneous tissue, unspecified: Secondary | ICD-10-CM

## 2020-03-27 ENCOUNTER — Encounter: Payer: Self-pay | Admitting: Family Medicine

## 2020-03-28 MED ORDER — AMPHETAMINE-DEXTROAMPHETAMINE 20 MG PO TABS: ORAL_TABLET | ORAL | 0 refills | Status: DC

## 2020-04-29 IMAGING — CT NM PET METABOLIC BRAIN
1 of 5 series · 4 of 25 positions shown · non-contrast
Comparison: Brain MRI 02/10/2019

CLINICAL DATA: Dementia. Frontotemporal dementia versus Alzheimer's
type dementia

EXAM:
NM PET METABOLIC BRAIN
TECHNIQUE: 9.97 mCi F-18 FDG was injected intravenously. Full-ring PET imaging
was performed from the vertex to skull base. CT data was obtained
and used for attenuation correction and anatomic localization.
FASTING BLOOD GLUCOSE:  Value: 96 mg/dl

[Series 4: ct brain 3.0 h31s · axial · 3.0mm · 0.44mm/px · z∈[+3,+87]mm · 4 of 85 slices shown]
[im 15/85  brain]
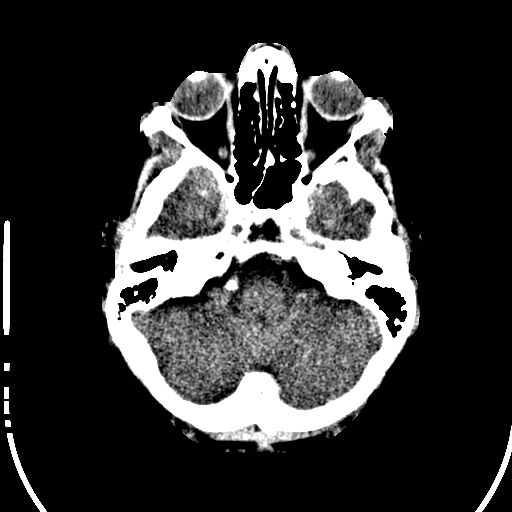
[im 29/85  brain]
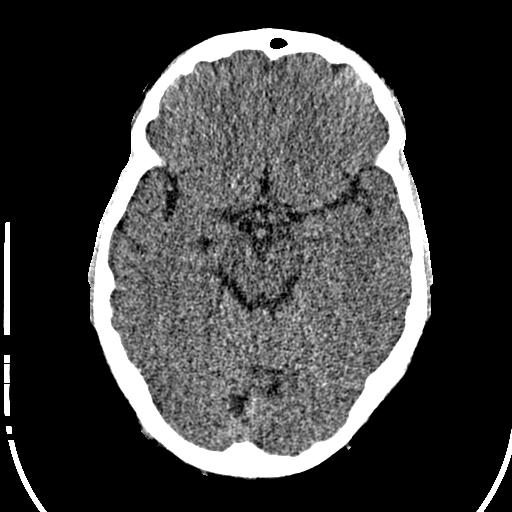
[im 43/85  brain]
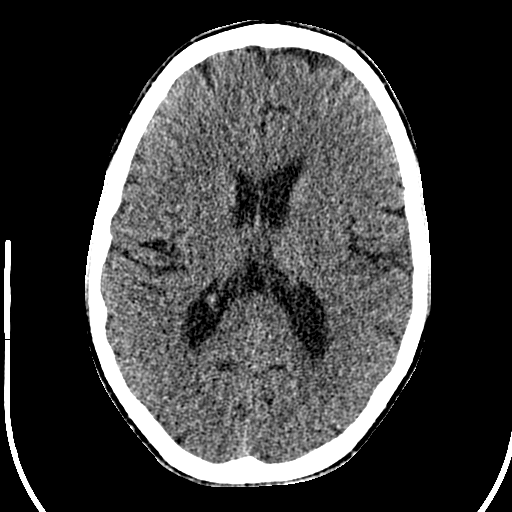
[im 57/85  brain]
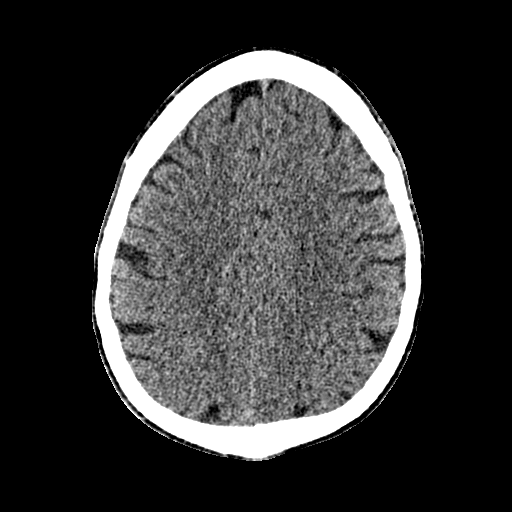

[4 of 25 positions shown; findings below may reference images not displayed]

FINDINGS: Uniform metabolic activity within the frontal and parietal lobes. No
decreased parietal lobe activity. Node decreased frontal lobe
activity. Symmetric uniform cortical activity within the temporal
lobes.
IMPRESSION: No decreased cortical metabolism within the parietal lobes to
suggest Alzheimer's pathology. No decreased frontal lobe cortical
activity to suggest frontotemporal dementia.

Essentially normal cortical metabolism.

## 2020-05-01 ENCOUNTER — Ambulatory Visit: Payer: Medicare Other | Admitting: Family Medicine

## 2020-05-14 ENCOUNTER — Encounter (INDEPENDENT_AMBULATORY_CARE_PROVIDER_SITE_OTHER): Payer: Self-pay | Admitting: Internal Medicine

## 2020-05-16 ENCOUNTER — Other Ambulatory Visit (INDEPENDENT_AMBULATORY_CARE_PROVIDER_SITE_OTHER): Payer: Self-pay | Admitting: Internal Medicine

## 2020-05-16 ENCOUNTER — Encounter (INDEPENDENT_AMBULATORY_CARE_PROVIDER_SITE_OTHER): Payer: Self-pay | Admitting: Nurse Practitioner

## 2020-05-17 ENCOUNTER — Telehealth (INDEPENDENT_AMBULATORY_CARE_PROVIDER_SITE_OTHER): Payer: Self-pay | Admitting: Nurse Practitioner

## 2020-05-17 ENCOUNTER — Other Ambulatory Visit: Payer: Self-pay

## 2020-05-17 ENCOUNTER — Ambulatory Visit (INDEPENDENT_AMBULATORY_CARE_PROVIDER_SITE_OTHER): Payer: Medicare Other | Admitting: Nurse Practitioner

## 2020-05-17 ENCOUNTER — Encounter (INDEPENDENT_AMBULATORY_CARE_PROVIDER_SITE_OTHER): Payer: Self-pay | Admitting: Nurse Practitioner

## 2020-05-17 VITALS — BP 126/86 | HR 94 | Temp 98.4°F | Ht 66.5 in | Wt 182.2 lb

## 2020-05-17 DIAGNOSIS — R1011 Right upper quadrant pain: Secondary | ICD-10-CM

## 2020-05-17 DIAGNOSIS — L989 Disorder of the skin and subcutaneous tissue, unspecified: Secondary | ICD-10-CM | POA: Diagnosis not present

## 2020-05-17 NOTE — Progress Notes (Signed)
Subjective:  Patient ID: Justin Carroll, male    DOB: 1955-09-13  Age: 65 y.o. MRN: [id number]  CC:  Chief Complaint  Patient presents with  . Abdominal Pain    Right side rib pain that radiates into back, going on for 1 week, acid reflux has become more severe, pain is worse at night, diarrhea and constipation when traveling and has diet changes  . Other    Skin lesion      HPI  This patient arrives today for the above.  Abdominal pain: He has been having abdominal pain that radiates to his back on the right upper quadrant for the last 3 weeks.  He tells me the first time he felt it actually woke him up in the middle the night.  He thought maybe this was GERD because he was experiencing some burning in the back of the throat at that time as well.  Since then he has had 1 additional episode that woke him up in the night otherwise he feels that when he bends or twists to the right.  He tells me that the intensity is about 310 and reports it has "not sharp" but not able to define it further than that.  He has been experiencing some constipation versus diarrhea especially when traveling.  He does try to walk 2 miles a day and denies any chest pain, arm pain, or jaw pain.  He is not really tried much to treat the pain but is wondering if he should be worried about it and would like further evaluation.  Skin lesion: He has a skin lesion located to the left lower leg and he does have a history of basal cell carcinoma of the skin.  He tells me he was referred to dermatology in the past but never went due to scheduling conflicts would like referral reordered today.    Past Medical History:  Diagnosis Date  . Depression   . Sinus infection    yearly      Family History  Problem Relation Age of Onset  . Allergic rhinitis Brother   . Colon cancer Neg Hx   . Pancreatic cancer Neg Hx   . Rectal cancer Neg Hx   . 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   Social History   Tobacco Use  . Smoking status: Former Smoker    Packs/day: 1.50    Years: 10.00    Pack years: 15.00    Start date: 1995    Quit date: 2015    Years since quitting: 7.2  . Smokeless tobacco: Never Used  Substance Use Topics  . Alcohol use: No     Current Meds  Medication Sig  . amphetamine-dextroamphetamine (ADDERALL) 20 MG tablet Take 1 tablet in the morning and additional tablet if needed at lunch  . azelastine (ASTELIN) 0.1 % nasal spray Place 2 sprays into both nostrils 2 (two) times daily as needed for rhinitis.  Marland Kitchen buPROPion (WELLBUTRIN XL) 300 MG 24 hr tablet Take 1 tablet by mouth once daily  . Cholecalciferol (VITAMIN D-3) 25 MCG (1000 UT) CAPS Take 2 capsules by mouth daily.  Marland Kitchen levocetirizine (XYZAL) 5 MG tablet Take 5 mg by mouth every evening.  . mometasone (NASONEX) 50 MCG/ACT nasal spray Place 1 spray into the nose daily.  . pantoprazole (PROTONIX) 40 MG tablet Take 1 tablet by mouth once daily  . tamsulosin (  FLOMAX) 0.4 MG CAPS capsule TAKE 1 CAPSULE BY MOUTH DAILY AFTER SUPPER  . testosterone cypionate (DEPOTESTOSTERONE CYPIONATE) 200 MG/ML injection INJECT 0.5 MLS INTO THE MUSCLE TWICE A WEEK.  . traZODone (DESYREL) 100 MG tablet TAKE 1 TABLET BY MOUTH AT BEDTIME    ROS:  Review of Systems  Constitutional: Negative for chills, fever and weight loss.  Respiratory: Negative for shortness of breath.   Cardiovascular: Negative for chest pain.  Gastrointestinal: Positive for abdominal pain, constipation and diarrhea. Negative for heartburn, nausea and vomiting.  Psychiatric/Behavioral: The patient has insomnia.      Objective:   Today's Vitals: BP 126/86   Pulse 94   Temp 98.4 F (36.9 C) (Temporal)   Ht 5' 6.5" (1.689 m)   Wt 182 lb 3.2 oz (82.6 kg)   SpO2 97%   BMI 28.97 kg/m  Vitals with BMI 05/17/2020 02/15/2020 12/08/2019  Height 5' 6.5" 5' 6.5" 5' 6.5"  Weight 182 lbs 3 oz 180 lbs 6 oz 186 lbs  BMI  28.97 54.65 03.54  Systolic 656 812 751  Diastolic 86 82 96  Pulse 94 88 78     Physical Exam Vitals reviewed.  Constitutional:      Appearance: Normal appearance.  HENT:     Head: Normocephalic and atraumatic.  Cardiovascular:     Rate and Rhythm: Normal rate and regular rhythm.  Pulmonary:     Effort: Pulmonary effort is normal.     Breath sounds: Normal breath sounds.  Abdominal:     General: Abdomen is flat. Bowel sounds are normal. There is no distension.     Palpations: Abdomen is soft. There is no hepatomegaly, splenomegaly or mass.     Tenderness: There is right CVA tenderness.  Musculoskeletal:     Cervical back: Neck supple.  Skin:    General: Skin is warm and dry.       Neurological:     Mental Status: He is alert and oriented to person, place, and time.  Psychiatric:        Mood and Affect: Mood normal.        Behavior: Behavior normal.        Thought Content: Thought content normal.        Judgment: Judgment normal.          Assessment and Plan   1. Right upper quadrant abdominal pain   2. Skin lesion      Plan: 1.  Etiology unclear at this time.  Will initially start work-up with lab work as well as ultrasound of the abdomen.  Further recommendations will be made based upon these results. 2.  Will refer to dermatology today.   Tests ordered Orders Placed This Encounter  Procedures  . US Abdomen Complete  . CMP with eGFR([facility])  . Lipase  . CBC with Differential/Platelets  . Urinalysis with Culture Reflex  . Ambulatory referral to Dermatology      No orders of the defined types were placed in this encounter.   Patient to follow-up in 1 month or sooner as needed.  Ailene Ards, NP

## 2020-05-17 NOTE — Telephone Encounter (Signed)
Ordering abdominal ultrasound for this patient today.  Just FYI, please work on this when he can.  Thank you.

## 2020-05-18 LAB — CBC WITH DIFFERENTIAL/PLATELET
Absolute Monocytes: 855 cells/uL (ref 200–950)
Basophils Absolute: 31 cells/uL (ref 0–200)
Basophils Relative: 0.3 %
Eosinophils Absolute: 62 cells/uL (ref 15–500)
Eosinophils Relative: 0.6 %
HCT: 52.5 % — ABNORMAL HIGH (ref 38.5–50.0)
Hemoglobin: 17.2 g/dL — ABNORMAL HIGH (ref 13.2–17.1)
Lymphs Abs: 1648 cells/uL (ref 850–3900)
MCH: 28.4 pg (ref 27.0–33.0)
MCHC: 32.8 g/dL (ref 32.0–36.0)
MCV: 86.6 fL (ref 80.0–100.0)
MPV: 9.7 fL (ref 7.5–12.5)
Monocytes Relative: 8.3 %
Neutro Abs: 7704 cells/uL (ref 1500–7800)
Neutrophils Relative %: 74.8 %
Platelets: 233 10*3/uL (ref 140–400)
RBC: 6.06 10*6/uL — ABNORMAL HIGH (ref 4.20–5.80)
RDW: 15.2 % — ABNORMAL HIGH (ref 11.0–15.0)
Total Lymphocyte: 16 %
WBC: 10.3 10*3/uL (ref 3.8–10.8)

## 2020-05-18 LAB — COMPLETE METABOLIC PANEL WITH GFR
AG Ratio: 1.5 (calc) (ref 1.0–2.5)
ALT: 14 U/L (ref 9–46)
AST: 15 U/L (ref 10–35)
Albumin: 4.3 g/dL (ref 3.6–5.1)
Alkaline phosphatase (APISO): 74 U/L (ref 35–144)
BUN: 13 mg/dL (ref 7–25)
CO2: 21 mmol/L (ref 20–32)
Calcium: 9.4 mg/dL (ref 8.6–10.3)
Chloride: 101 mmol/L (ref 98–110)
Creat: 1.24 mg/dL (ref 0.70–1.25)
GFR, Est African American: 70 mL/min/{1.73_m2} (ref >=60)
GFR, Est Non African American: 60 mL/min/{1.73_m2} (ref >=60)
Globulin: 2.8 g/dL (calc) (ref 1.9–3.7)
Glucose, Bld: 77 mg/dL (ref 65–99)
Potassium: 4.2 mmol/L (ref 3.5–5.3)
Sodium: 135 mmol/L (ref 135–146)
Total Bilirubin: 0.8 mg/dL (ref 0.2–1.2)
Total Protein: 7.1 g/dL (ref 6.1–8.1)

## 2020-05-18 LAB — URINALYSIS W MICROSCOPIC + REFLEX CULTURE
Bacteria, UA: NONE SEEN /HPF
Bilirubin Urine: NEGATIVE
Glucose, UA: NEGATIVE
Hgb urine dipstick: NEGATIVE
Hyaline Cast: NONE SEEN /LPF
Leukocyte Esterase: NEGATIVE
Nitrites, Initial: NEGATIVE
Protein, ur: NEGATIVE
RBC / HPF: NONE SEEN /HPF (ref 0–2)
Specific Gravity, Urine: 1.025 (ref 1.001–1.03)
Squamous Epithelial / HPF: NONE SEEN /HPF (ref <=5)
WBC, UA: NONE SEEN /HPF (ref 0–5)
pH: <=5 (ref 5.0–8.0)

## 2020-05-18 LAB — NO CULTURE INDICATED

## 2020-05-18 LAB — LIPASE: Lipase: 40 U/L (ref 7–60)

## 2020-05-19 ENCOUNTER — Encounter (INDEPENDENT_AMBULATORY_CARE_PROVIDER_SITE_OTHER): Payer: Self-pay | Admitting: Internal Medicine

## 2020-05-19 MED ORDER — AZITHROMYCIN 250 MG PO TABS: ORAL_TABLET | ORAL | 0 refills | Status: DC

## 2020-05-19 MED ORDER — PREDNISONE 20 MG PO TABS: 40.0000 mg | ORAL_TABLET | Freq: Every day | ORAL | 1 refills | Status: DC

## 2020-05-22 ENCOUNTER — Ambulatory Visit (HOSPITAL_COMMUNITY)
Admission: RE | Admit: 2020-05-22 | Discharge: 2020-05-22 | Disposition: A | Discharge status: Home / Self Care | Payer: Medicare Other | Source: Ambulatory Visit | Attending: Nurse Practitioner | Admitting: Nurse Practitioner

## 2020-05-22 DIAGNOSIS — R1011 Right upper quadrant pain: Secondary | ICD-10-CM | POA: Diagnosis not present

## 2020-05-24 ENCOUNTER — Ambulatory Visit (INDEPENDENT_AMBULATORY_CARE_PROVIDER_SITE_OTHER): Payer: Medicare Other | Admitting: Nurse Practitioner

## 2020-05-31 ENCOUNTER — Encounter: Payer: Self-pay | Admitting: Family Medicine

## 2020-05-31 MED ORDER — AMPHETAMINE-DEXTROAMPHETAMINE 20 MG PO TABS: ORAL_TABLET | ORAL | 0 refills | Status: DC

## 2020-05-31 NOTE — Telephone Encounter (Signed)
Next appt 07-19-2020, last seen in 10-2019

## 2020-06-15 ENCOUNTER — Ambulatory Visit (INDEPENDENT_AMBULATORY_CARE_PROVIDER_SITE_OTHER): Payer: Medicare Other | Admitting: Internal Medicine

## 2020-06-15 ENCOUNTER — Other Ambulatory Visit: Payer: Self-pay

## 2020-06-15 ENCOUNTER — Encounter (INDEPENDENT_AMBULATORY_CARE_PROVIDER_SITE_OTHER): Payer: Self-pay | Admitting: Internal Medicine

## 2020-06-15 VITALS — BP 134/84 | HR 93 | Temp 98.0°F | Ht 66.5 in | Wt 182.6 lb

## 2020-06-15 DIAGNOSIS — R6882 Decreased libido: Secondary | ICD-10-CM | POA: Diagnosis not present

## 2020-06-15 DIAGNOSIS — E559 Vitamin D deficiency, unspecified: Secondary | ICD-10-CM

## 2020-06-15 DIAGNOSIS — R5381 Other malaise: Secondary | ICD-10-CM

## 2020-06-15 DIAGNOSIS — Z125 Encounter for screening for malignant neoplasm of prostate: Secondary | ICD-10-CM | POA: Diagnosis not present

## 2020-06-15 DIAGNOSIS — R5383 Other fatigue: Secondary | ICD-10-CM

## 2020-06-15 NOTE — Progress Notes (Signed)
Metrics: Intervention Frequency ACO  Documented Smoking Status Yearly  Screened one or more times in 24 months  Cessation Counseling or  Active cessation medication Past 24 months  Past 24 months   Guideline developer: UpToDate (See UpToDate for funding source) Date Released: 2014       Wellness Office Visit  Subjective:  Patient ID: Justin Carroll, male    DOB: 1955-09-29  Age: 65 y.o. MRN: [id number]  CC: This man comes in for follow-up of testosterone therapy, vitamin D deficiency, recent right upper quadrant abdominal pain. HPI  The right upper quadrant abdominal pain seems to have resolved.  Ultrasound was done which shows gallstones and gallbladder polyps.  There is also evidence of fatty liver disease.  I explained all these findings to him.  He has had no further abdominal pain. He continues on testosterone therapy twice a week and seems to be tolerating it well.  His last injection was yesterday morning. He continues on vitamin D3 2000 units daily for vitamin D deficiency. Past Medical History:  Diagnosis Date  . Depression   . Sinus infection    yearly   Past Surgical History:  Procedure Laterality Date  . ADENOIDECTOMY    . NASAL SEPTUM SURGERY    . SINOSCOPY    . TONSILLECTOMY     age 51     Family History  Problem Relation Age of Onset  . Allergic rhinitis Brother   . Colon cancer Neg Hx   . Pancreatic cancer Neg Hx   . Rectal cancer Neg Hx   . 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   Social History   Tobacco Use  . Smoking status: Former Smoker    Packs/day: 1.50    Years: 10.00    Pack years: 15.00    Start date: 1995    Quit date: 2015    Years since quitting: 7.3  . Smokeless tobacco: Never Used  Substance Use Topics  . Alcohol use: No    Current Meds  Medication Sig  . amphetamine-dextroamphetamine (ADDERALL) 20 MG tablet Take 1 tablet in the morning and additional tablet if  needed at lunch  . azelastine (ASTELIN) 0.1 % nasal spray Place 2 sprays into both nostrils 2 (two) times daily as needed for rhinitis.  Marland Kitchen buPROPion (WELLBUTRIN XL) 300 MG 24 hr tablet Take 1 tablet by mouth once daily  . Cholecalciferol (VITAMIN D-3) 25 MCG (1000 UT) CAPS Take 2 capsules by mouth daily.  Marland Kitchen levocetirizine (XYZAL) 5 MG tablet Take 5 mg by mouth every evening.  . mometasone (NASONEX) 50 MCG/ACT nasal spray Place 1 spray into the nose daily.  . pantoprazole (PROTONIX) 40 MG tablet Take 1 tablet by mouth once daily  . tamsulosin (FLOMAX) 0.4 MG CAPS capsule TAKE 1 CAPSULE BY MOUTH DAILY AFTER SUPPER  . testosterone cypionate (DEPOTESTOSTERONE CYPIONATE) 200 MG/ML injection INJECT 0.5 MLS INTO THE MUSCLE TWICE A WEEK.  . traZODone (DESYREL) 100 MG tablet TAKE 1 TABLET BY MOUTH AT BEDTIME     Flowsheet Row Office Visit from 05/17/2020 in [facility]  PHQ-9 Total Score 0      Objective:   Today's Vitals: BP 134/84   Pulse 93   Temp 98 F (36.7 C) (Temporal)   Ht 5' 6.5" (1.689 m)   Wt 182 lb 9.6 oz (82.8 kg)   SpO2 94%   BMI 29.03 kg/m  Vitals with  BMI 06/15/2020 05/17/2020 02/15/2020  Height 5' 6.5" 5' 6.5" 5' 6.5"  Weight 182 lbs 10 oz 182 lbs 3 oz 180 lbs 6 oz  BMI 29.03 90.24 09.73  Systolic 532 992 426  Diastolic 84 86 82  Pulse 93 94 88     Physical Exam  He looks systemically well.  Blood pressure is reasonable for his age.  Weight is stable.  Alert and orientated without any focal neuro signs.     Assessment   1. Decreased libido   2. Vitamin D deficiency disease   3. Malaise and fatigue   4. Special screening for malignant neoplasm of prostate       Tests ordered Orders Placed This Encounter  Procedures  . COMPLETE METABOLIC PANEL WITH GFR  . VITAMIN D 25 Hydroxy (Vit-D Deficiency, Fractures)  . PSA, Total with Reflex to PSA, Free  . Testosterone Total,Free,Bio, Males     Plan: 1. He will continue with vitamin D3 2000 units  daily and we will check levels today. 2. He will continue with testosterone therapy 100 mg twice a week and we will check levels today along with a PSA. 3. Further recommendations will depend on all these blood results and I will see him in about 4 months time for follow-up.   No orders of the defined types were placed in this encounter.   Doree Albee, MD

## 2020-06-16 LAB — COMPLETE METABOLIC PANEL WITH GFR
AG Ratio: 1.6 (calc) (ref 1.0–2.5)
ALT: 19 U/L (ref 9–46)
AST: 18 U/L (ref 10–35)
Albumin: 4.1 g/dL (ref 3.6–5.1)
Alkaline phosphatase (APISO): 69 U/L (ref 35–144)
BUN/Creatinine Ratio: 10 (calc) (ref 6–22)
BUN: 13 mg/dL (ref 7–25)
CO2: 23 mmol/L (ref 20–32)
Calcium: 9 mg/dL (ref 8.6–10.3)
Chloride: 105 mmol/L (ref 98–110)
Creat: 1.29 mg/dL — ABNORMAL HIGH (ref 0.70–1.25)
GFR, Est African American: 67 mL/min/{1.73_m2} (ref >=60)
GFR, Est Non African American: 57 mL/min/{1.73_m2} — ABNORMAL LOW (ref >=60)
Globulin: 2.6 g/dL (calc) (ref 1.9–3.7)
Glucose, Bld: 106 mg/dL (ref 65–139)
Potassium: 4.6 mmol/L (ref 3.5–5.3)
Sodium: 138 mmol/L (ref 135–146)
Total Bilirubin: 0.5 mg/dL (ref 0.2–1.2)
Total Protein: 6.7 g/dL (ref 6.1–8.1)

## 2020-06-16 LAB — TESTOSTERONE TOTAL,FREE,BIO, MALES
Albumin: 4.1 g/dL (ref 3.6–5.1)
Sex Hormone Binding: 24 nmol/L (ref 22–77)
Testosterone, Bioavailable: 541 ng/dL (ref 110.0–575.0)
Testosterone, Free: 287.4 pg/mL — ABNORMAL HIGH (ref 46.0–224.0)
Testosterone: 1232 ng/dL — ABNORMAL HIGH (ref 250–827)

## 2020-06-16 LAB — PSA, TOTAL WITH REFLEX TO PSA, FREE: PSA, Total: 2.4 ng/mL (ref <=4.0)

## 2020-06-16 LAB — VITAMIN D 25 HYDROXY (VIT D DEFICIENCY, FRACTURES): Vit D, 25-Hydroxy: 60 ng/mL (ref 30–100)

## 2020-07-19 ENCOUNTER — Ambulatory Visit: Payer: Medicare Other | Admitting: Family Medicine

## 2020-07-20 ENCOUNTER — Encounter: Payer: Self-pay | Admitting: Family Medicine

## 2020-07-20 ENCOUNTER — Other Ambulatory Visit: Payer: Self-pay | Admitting: Neurology

## 2020-07-20 MED ORDER — AMPHETAMINE-DEXTROAMPHETAMINE 20 MG PO TABS: ORAL_TABLET | ORAL | 0 refills | Status: DC

## 2020-07-23 ENCOUNTER — Other Ambulatory Visit (INDEPENDENT_AMBULATORY_CARE_PROVIDER_SITE_OTHER): Payer: Self-pay | Admitting: Internal Medicine

## 2020-07-23 ENCOUNTER — Encounter (INDEPENDENT_AMBULATORY_CARE_PROVIDER_SITE_OTHER): Payer: Self-pay | Admitting: Internal Medicine

## 2020-07-23 MED ORDER — NIRMATRELVIR/RITONAVIR (PAXLOVID)TABLET: 3.0000 | ORAL_TABLET | Freq: Two times a day (BID) | ORAL | 0 refills | Status: DC

## 2020-07-23 MED ORDER — NIRMATRELVIR/RITONAVIR (PAXLOVID)TABLET
3.0000 | ORAL_TABLET | Freq: Two times a day (BID) | ORAL | 0 refills | Status: AC
  Filled 2020-07-23: qty 30, 5d supply, fill #0

## 2020-07-24 ENCOUNTER — Other Ambulatory Visit (HOSPITAL_COMMUNITY): Payer: Self-pay

## 2020-07-24 ENCOUNTER — Telehealth (INDEPENDENT_AMBULATORY_CARE_PROVIDER_SITE_OTHER): Payer: Self-pay

## 2020-07-24 NOTE — Telephone Encounter (Signed)
Received a fax from [facility] stating that the need the GFR value in order to fill this prescription:  Rx #: [id number]  nirmatrelvir/ritonavir EUA (PAXLOVID) TABS

## 2020-07-24 NOTE — Telephone Encounter (Signed)
I have already dealt with this with another pharmacy on this patient.  Please disregard.

## 2020-07-26 ENCOUNTER — Encounter (INDEPENDENT_AMBULATORY_CARE_PROVIDER_SITE_OTHER): Payer: Self-pay | Admitting: Internal Medicine

## 2020-08-01 ENCOUNTER — Other Ambulatory Visit (HOSPITAL_COMMUNITY): Payer: Self-pay

## 2020-08-15 ENCOUNTER — Ambulatory Visit: Payer: Medicare Other | Admitting: Family Medicine

## 2020-08-20 ENCOUNTER — Other Ambulatory Visit (INDEPENDENT_AMBULATORY_CARE_PROVIDER_SITE_OTHER): Payer: Self-pay | Admitting: Nurse Practitioner

## 2020-09-12 ENCOUNTER — Encounter: Payer: Self-pay | Admitting: Family Medicine

## 2020-09-13 NOTE — Patient Instructions (Signed)
Below is our plan:  We will continue Adderall '20mg'$  every morning and extra dose in afternoon if needed.   Please make sure you are staying well hydrated. I recommend 50-60 ounces daily. Well balanced diet and regular exercise encouraged. Consistent sleep schedule with 6-8 hours recommended.   Please continue follow up with care team as directed.   Follow up with Dr Brett Fairy in 6 months   You may receive a survey regarding today's visit. I encourage you to leave honest feed back as I do use this information to improve patient care. Thank you for seeing me today!    Management of Memory Problems   There are some general things you can do to help manage your memory problems.  Your memory may not in fact recover, but by using techniques and strategies you will be able to manage your memory difficulties better.   1)  Establish a routine. Try to establish and then stick to a regular routine.  By doing this, you will get used to what to expect and you will reduce the need to rely on your memory.  Also, try to do things at the same time of day, such as taking your medication or checking your calendar first thing in the morning. Think about think that you can do as a part of a regular routine and make a list.  Then enter them into a daily planner to remind you.  This will help you establish a routine.   2)  Organize your environment. Organize your environment so that it is uncluttered.  Decrease visual stimulation.  Place everyday items such as keys or cell phone in the same place every day (ie.  Basket next to front door) Use post it notes with a brief message to yourself (ie. Turn off light, lock the door) Use labels to indicate where things go (ie. Which cupboards are for food, dishes, etc.) Keep a notepad and pen by the telephone to take messages   3)  Memory Aids A diary or journal/notebook/daily planner Making a list (shopping list, chore list, to do list that needs to be done) Using an  alarm as a reminder (kitchen timer or cell phone alarm) Using cell phone to store information (Notes, Calendar, Reminders) Calendar/White board placed in a prominent position Post-it notes   In order for memory aids to be useful, you need to have good habits.  It's no good remembering to make a note in your journal if you don't remember to look in it.  Try setting aside a certain time of day to look in journal.   4)  Improving mood and managing fatigue. There may be other factors that contribute to memory difficulties.  Factors, such as anxiety, depression and tiredness can affect memory. Regular gentle exercise can help improve your mood and give you more energy. Simple relaxation techniques may help relieve symptoms of anxiety Try to get back to completing activities or hobbies you enjoyed doing in the past. Learn to pace yourself through activities to decrease fatigue. Find out about some local support groups where you can share experiences with others. Try and achieve 7-8 hours of sleep at night.

## 2020-09-13 NOTE — Progress Notes (Signed)
PATIENT: Justin Carroll DOB: [birth year]  REASON FOR VISIT: follow up HISTORY FROM: patient  Chief Complaint  Patient presents with   Follow-up    RM 1, alone. Last seen 10/20/2019. MOCA today 27/30.    HISTORY OF PRESENT ILLNESS: 09/14/2020 ALL:  Mr Justin Carroll returns for follow up for memory loss and attention deficit. He continues Adderall IR '20mg'$  every morning and an additional '20mg'$  in the evenings if needed.   10/20/2019 ALL:  Justin Carroll is a 65 y.o. male here today for follow up for memory loss. He was last seen in 04/2019 with normal MOCA. Question of dementia versus ADD. He discontinued Aricept '10mg'$  per PCP direction. Adderall was added in March, 2021. He felt that extended release made him more irritable. He switched to IR dosage and feels that it worked better. He notes a significant difference in ability to focus and concentrate. He stopped taking Adderall and noted a clear worsening in attention. He feels that memory is stable. 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 He feels that with less cases he will have time to focus and, in turn, decrease stress levels.    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  Evaluation of Memory.   He is accompanied by his wife. He has not gotten lost , he has not had a fall since last visit! Still grouchy, but he is doing better with orientation, his work flow had been restructured. He has resigned form jury trials after our first meeting. This reduced his stress level. He mentioned his stress level in court rose with COVID, the jury trials on [place].  He is sleeping well. He is reportedly a recovering alcoholic for 33 years , no medication , no drugs, he is on Wellbutrin. He is breathing better,, following a nasal septal surgery on march 4th. Recovering well. [name].     His PET scan was normal (!) and his MOCA has a normal result today , too. We may not deal with a dementia at all. 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 Neuropsychological testing is pending. For JUNE (!)- Dr. [name].  Will see if he can see the new [place] neuropsychologist.  His wife tends to attribute his last years memory decline and recovery to reducing stress and distractions. He may have adult ADD. ADHD ? I will give a try to Ritalin.  He stays on Aricept now 10 mg. requests a 90 day supply.   CD      Dr. Anastasio Champion  added that the patient had come somewhat more irritable at times and his wife used the term "grouchy". He has not lost awareness or consciousness with any of his falls they don't seem to be syncope or seizures but they're sudden. He denies any lightheadedness or vertigo. He has not felt clumsy whilst walking and his speech has not changed he has also asked for an ENT referral as he has frequent sinus congestion. He has seen Dr Lucia Gaskins, he will do a septal deviation repair.    The family history is positive for a father with coronary artery disease, pulmonary disease/ COPD , who was heavy smoker. A brother was post mortem diagnosed with vascular dementia.    There may have been a progression slowly over the  last 3 years or so of memory loss being noticeable but over the last 6 months the family has noted a significant progression. Especially with getting lost on a familiar road on a routine drive. He has a completely disorganized office, he has misfiled papers, lost papers. His Network engineer and his wife have kept him from taking charts home.    He has impaired depth perception, has trouble parking a larger vehicle, but stated he has no trouble reading.  He reportedly has been unimpaired when it comes to accounting, but this is mostly done by his wife.   He has very good and very bad days, is usually better in the morning when it  comes to cognitive performance. He has lost words, skips sometimes words, non-fluent - he makes more small talk than actual conversation.  He denies nocturnal activity, acting out dreams, but is a restless sleeper.    An MRI brain without contrast did not reveal abnormalities. [name] ordered.   Part of our exam today was a Montreal cognitive assessment , abbreviated MOCA, and Mr. Justin Carroll scored 24 out of 30 points , placing him just at the borderline between a mild cognitive impairment and a beginning dementia.  There was impairment with visual-spatial problem-solving to copy a cube was difficult he could name three animals could repeat words and a series of letters he was very good for subtraction and mathematics he remembered 3 out of 5 delay words. His orientation to place and time was completely intact.   Montreal Cognitive Assessment  04/19/2019 02/18/2019  Visuospatial/ Executive (0/5) 4 3  Naming (0/3) 3 3  Attention: Read list of digits (0/2) 2 2  Attention: Read list of letters (0/1) 1 1  Attention: Serial 7 subtraction starting at 100 (0/3) 3 3  Language: Repeat phrase (0/2) 1 2  Language : Fluency (0/1) 1 1  Abstraction (0/2) 2 2  Delayed Recall (0/5) 5 3  Orientation (0/6) 6 6  Total 28 26       REVIEW OF SYSTEMS: Out of a complete 14 system review of symptoms, the patient complains only of the following symptoms, inattention, anxiety and all other reviewed systems are negative.  ALLERGIES: No Known Allergies  HOME MEDICATIONS: Outpatient Medications Prior to Visit  Medication Sig Dispense Refill   amphetamine-dextroamphetamine (ADDERALL) 20 MG tablet Take 1 tablet in the morning and additional tablet if needed at lunch 60 tablet 0   azelastine (ASTELIN) 0.1 % nasal spray Place 2 sprays into both nostrils 2 (two) times daily as needed for rhinitis. 30 mL 5   buPROPion (WELLBUTRIN XL) 300 MG 24 hr tablet Take 1 tablet by mouth once daily 90 tablet 0   Cholecalciferol  (VITAMIN D-3) 25 MCG (1000 UT) CAPS Take 2 capsules by mouth daily.     levocetirizine (XYZAL) 5 MG tablet Take 5 mg by mouth every evening.     mometasone (NASONEX) 50 MCG/ACT nasal spray Place 1 spray into the nose daily. 17 g 5   pantoprazole (PROTONIX) 40 MG tablet Take 1 tablet by mouth once daily 90 tablet 0   tamsulosin (FLOMAX) 0.4 MG CAPS capsule TAKE 1 CAPSULE BY MOUTH ONCE DAILY AFTER SUPPER 90 capsule 0   testosterone cypionate (DEPOTESTOSTERONE CYPIONATE) 200 MG/ML injection INJECT 0.5 MLS INTO THE MUSCLE TWICE A WEEK. 10 mL 2   traZODone (DESYREL) 100 MG tablet TAKE 1 TABLET BY MOUTH AT BEDTIME 90 tablet 0   PAXLOVID 20 x 150 MG & 10 x '100MG'$   TBPK TAKE 3 TABLETS BY MOUTH TWICE DAILY PER PACKAGE DIRECTIONS FOR 5 DAYS 30 each 0   No facility-administered medications prior to visit.    PAST MEDICAL HISTORY: Past Medical History:  Diagnosis Date   Depression    Sinus infection    yearly    PAST SURGICAL HISTORY: Past Surgical History:  Procedure Laterality Date   ADENOIDECTOMY     NASAL SEPTUM SURGERY     SINOSCOPY     TONSILLECTOMY     age 66    FAMILY HISTORY: Family History  Problem Relation Age of Onset   Allergic rhinitis Brother    Colon cancer Neg Hx    Pancreatic cancer Neg Hx    Rectal cancer Neg Hx    Stomach cancer Neg Hx     SOCIAL HISTORY: Social History   Socioeconomic History   Marital status: Married    Spouse name: Not on file   Number of children: Not on file   Years of education: Not on file   Highest education level: Not on file  Occupational History   Not on file  Tobacco Use   Smoking status: Former    Packs/day: 1.50    Years: 10.00    Pack years: 15.00    Types: Cigarettes    Start date: 48    Quit date: 2015    Years since quitting: 7.6   Smokeless tobacco: Never  Vaping Use   Vaping Use: Never used  Substance and Sexual Activity   Alcohol use: No   Drug use: No   Sexual activity: Not on file  Other Topics Concern    Not on file  Social History Narrative   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   Social Determinants of Health   Financial Resource Strain: Not on file  Food Insecurity: Not on file  Transportation Needs: Not on file  Physical Activity: Not on file  Stress: Not on file  Social Connections: Not on file  Intimate Partner Violence: Not on file      PHYSICAL EXAM  Vitals:   09/14/20 0751  BP: (!) 140/92  Pulse: 89  Weight: 182 lb (82.6 kg)  Height: 5' 6.5" (1.689 m)    Body mass index is 28.94 kg/m.  Generalized: Well developed, in no acute distress  Cardiology: normal rate and rhythm, no murmur noted Respiratory: clear to auscultation bilaterally  Neurological examination  Mentation: Alert oriented to time, place, history taking. Follows all commands speech and language fluent Cranial nerve II-XII: Pupils were equal round reactive to light. Extraocular movements were full, visual field were full  Motor: The motor testing reveals 5 over 5 strength of all 4 extremities. Good symmetric motor tone is noted throughout.  Gait and station: Gait is normal.    DIAGNOSTIC DATA (LABS, IMAGING, TESTING) - I reviewed patient records, labs, notes, testing and imaging myself where available.  No flowsheet data found.   Lab Results  Component Value Date   WBC 10.3 05/17/2020   HGB 17.2 (H) 05/17/2020   HCT 52.5 (H) 05/17/2020   MCV 86.6 05/17/2020   PLT 233 05/17/2020      Component Value Date/Time   NA 138 06/15/2020 0000   NA 139 02/18/2019 1508   K 4.6 06/15/2020 0000   CL 105 06/15/2020 0000   CO2 23 06/15/2020 0000   GLUCOSE 106 06/15/2020 0000   BUN 13 06/15/2020 0000   BUN 12 02/18/2019 1508   CREATININE 1.29 (H) 06/15/2020 0000   CALCIUM  9.0 06/15/2020 0000   PROT 6.7 06/15/2020 0000   PROT 7.1 02/18/2019 1508   ALBUMIN 4.7 02/18/2019 1508   AST 18 06/15/2020 0000   ALT 19 06/15/2020 0000   ALKPHOS 106 02/18/2019 1508   BILITOT 0.5 06/15/2020 0000   BILITOT 0.3  02/18/2019 1508   GFRNONAA 57 (L) 06/15/2020 0000   GFRAA 67 06/15/2020 0000   No results found for: CHOL, HDL, LDLCALC, LDLDIRECT, TRIG, CHOLHDL No results found for: HGBA1C Lab Results  Component Value Date   VITAMINB12 460 01/27/2019   Lab Results  Component Value Date   TSH 0.87 01/27/2019    Montreal Cognitive Assessment  09/14/2020 10/20/2019 04/19/2019 02/18/2019  Visuospatial/ Executive (0/5) '4 5 4 3  '$ Naming (0/3) '3 3 3 3  '$ Attention: Read list of digits (0/2) '2 2 2 2  '$ Attention: Read list of letters (0/1) '1 1 1 1  '$ Attention: Serial 7 subtraction starting at 100 (0/3) '3 3 3 3  '$ Language: Repeat phrase (0/2) '1 2 1 2  '$ Language : Fluency (0/1) '1 1 1 1  '$ Abstraction (0/2) '2 2 2 2  '$ Delayed Recall (0/5) '5 2 5 3  '$ Orientation (0/6) '5 6 6 6  '$ Total '27 27 28 26  '$ Adjusted Score (based on education) 67 - - -     ASSESSMENT AND PLAN 65 y.o. year old male  has a past medical history of Depression and Sinus infection. here with     ICD-10-CM   1. Cognitive attention deficit  R41.840 Ambulatory referral to Neuropsychology    2. Memory loss, short term  R41.3 Ambulatory referral to Neuropsychology       Mr Cuautle is doing well, today. He has noted improvement in concentration, mood and memory since starting Adderall. We will continue Adderall '20mg'$  every morning and extra dose in the afternoons as needed. MOCA consistent with previous assessments 27/30. Most difficulty with recall and reports he has never been able to draw a cube. We have reviewed memory compensation strategies. Healthy lifestyle habits encouraged. I have encouraged him to consider pursuing neurocognitive testing with Dr Sima Matas, postponed due to wife's surgery. He requests a new referral today. He will follow up with PCP regularly. I have provided some names of local providers he may pursue establishing care with. He will follow up with Dr Brett Fairy in 6 months.    Orders Placed This Encounter  Procedures   Ambulatory  referral to Neuropsychology    Referral Priority:   Routine    Referral Type:   Psychiatric    Referral Reason:   Specialty Services Required    Requested Specialty:   Psychology    Number of Visits Requested:   1      No orders of the defined types were placed in this encounter.     Debbora Presto, FNP-C 09/14/2020, 8:29 AM [facility] [address] [place], [place] [address] [phone]

## 2020-09-14 ENCOUNTER — Encounter: Payer: Self-pay | Admitting: Family Medicine

## 2020-09-14 ENCOUNTER — Ambulatory Visit (INDEPENDENT_AMBULATORY_CARE_PROVIDER_SITE_OTHER): Payer: Medicare Other | Admitting: Family Medicine

## 2020-09-14 VITALS — BP 140/92 | HR 89 | Ht 66.5 in | Wt 182.0 lb

## 2020-09-14 DIAGNOSIS — R413 Other amnesia: Secondary | ICD-10-CM | POA: Diagnosis not present

## 2020-09-14 DIAGNOSIS — R41840 Attention and concentration deficit: Secondary | ICD-10-CM | POA: Diagnosis not present

## 2020-09-14 MED ORDER — AMPHETAMINE-DEXTROAMPHETAMINE 20 MG PO TABS: ORAL_TABLET | ORAL | 0 refills | Status: DC

## 2020-09-15 ENCOUNTER — Encounter: Payer: Self-pay | Admitting: Psychology

## 2020-09-20 ENCOUNTER — Other Ambulatory Visit: Payer: Self-pay | Admitting: Allergy & Immunology

## 2020-10-17 ENCOUNTER — Encounter (HOSPITAL_COMMUNITY): Payer: Self-pay | Admitting: *Deleted

## 2020-10-17 ENCOUNTER — Encounter: Payer: Self-pay | Admitting: Family Medicine

## 2020-10-17 ENCOUNTER — Inpatient Hospital Stay (HOSPITAL_COMMUNITY)
Admission: EM | Admit: 2020-10-17 | Discharge: 2020-10-20 | DRG: 247 | Disposition: A | Discharge status: Home / Self Care | Payer: Medicare Other | Attending: Cardiology | Admitting: Cardiology

## 2020-10-17 ENCOUNTER — Emergency Department (HOSPITAL_COMMUNITY): Payer: Medicare Other

## 2020-10-17 DIAGNOSIS — I255 Ischemic cardiomyopathy: Secondary | ICD-10-CM | POA: Diagnosis present

## 2020-10-17 DIAGNOSIS — I214 Non-ST elevation (NSTEMI) myocardial infarction: Secondary | ICD-10-CM | POA: Diagnosis present

## 2020-10-17 DIAGNOSIS — Z79899 Other long term (current) drug therapy: Secondary | ICD-10-CM

## 2020-10-17 DIAGNOSIS — J302 Other seasonal allergic rhinitis: Secondary | ICD-10-CM | POA: Diagnosis present

## 2020-10-17 DIAGNOSIS — E119 Type 2 diabetes mellitus without complications: Secondary | ICD-10-CM | POA: Diagnosis present

## 2020-10-17 DIAGNOSIS — F909 Attention-deficit hyperactivity disorder, unspecified type: Secondary | ICD-10-CM | POA: Diagnosis present

## 2020-10-17 DIAGNOSIS — I2510 Atherosclerotic heart disease of native coronary artery without angina pectoris: Secondary | ICD-10-CM | POA: Diagnosis not present

## 2020-10-17 DIAGNOSIS — N179 Acute kidney failure, unspecified: Secondary | ICD-10-CM | POA: Diagnosis present

## 2020-10-17 DIAGNOSIS — I25110 Atherosclerotic heart disease of native coronary artery with unstable angina pectoris: Secondary | ICD-10-CM | POA: Diagnosis present

## 2020-10-17 DIAGNOSIS — E782 Mixed hyperlipidemia: Secondary | ICD-10-CM | POA: Diagnosis not present

## 2020-10-17 DIAGNOSIS — Z23 Encounter for immunization: Secondary | ICD-10-CM

## 2020-10-17 DIAGNOSIS — Z87891 Personal history of nicotine dependence: Secondary | ICD-10-CM | POA: Diagnosis not present

## 2020-10-17 DIAGNOSIS — R079 Chest pain, unspecified: Secondary | ICD-10-CM | POA: Diagnosis not present

## 2020-10-17 DIAGNOSIS — Z7982 Long term (current) use of aspirin: Secondary | ICD-10-CM | POA: Diagnosis not present

## 2020-10-17 DIAGNOSIS — E785 Hyperlipidemia, unspecified: Secondary | ICD-10-CM | POA: Diagnosis present

## 2020-10-17 DIAGNOSIS — F32A Depression, unspecified: Secondary | ICD-10-CM | POA: Diagnosis present

## 2020-10-17 DIAGNOSIS — I472 Ventricular tachycardia: Secondary | ICD-10-CM | POA: Diagnosis not present

## 2020-10-17 DIAGNOSIS — R7989 Other specified abnormal findings of blood chemistry: Secondary | ICD-10-CM | POA: Diagnosis not present

## 2020-10-17 DIAGNOSIS — Z955 Presence of coronary angioplasty implant and graft: Secondary | ICD-10-CM

## 2020-10-17 DIAGNOSIS — Z20822 Contact with and (suspected) exposure to covid-19: Secondary | ICD-10-CM | POA: Diagnosis present

## 2020-10-17 LAB — HEPATIC FUNCTION PANEL
ALT: 26 U/L (ref 0–44)
AST: 23 U/L (ref 15–41)
Albumin: 4.1 g/dL (ref 3.5–5.0)
Alkaline Phosphatase: 62 U/L (ref 38–126)
Bilirubin, Direct: 0.1 mg/dL (ref 0.0–0.2)
Indirect Bilirubin: 0.6 mg/dL (ref 0.3–0.9)
Total Bilirubin: 0.7 mg/dL (ref 0.3–1.2)
Total Protein: 7.3 g/dL (ref 6.5–8.1)

## 2020-10-17 LAB — RESP PANEL BY RT-PCR (FLU A&B, COVID) ARPGX2
Influenza A by PCR: NEGATIVE
Influenza B by PCR: NEGATIVE
SARS Coronavirus 2 by RT PCR: NEGATIVE

## 2020-10-17 LAB — CBC
HCT: 57.3 % — ABNORMAL HIGH (ref 39.0–52.0)
Hemoglobin: 18 g/dL — ABNORMAL HIGH (ref 13.0–17.0)
MCH: 28.1 pg (ref 26.0–34.0)
MCHC: 31.4 g/dL (ref 30.0–36.0)
MCV: 89.5 fL (ref 80.0–100.0)
Platelets: 255 10*3/uL (ref 150–400)
RBC: 6.4 MIL/uL — ABNORMAL HIGH (ref 4.22–5.81)
RDW: 17.1 % — ABNORMAL HIGH (ref 11.5–15.5)
WBC: 8 10*3/uL (ref 4.0–10.5)
nRBC: 0 % (ref 0.0–0.2)

## 2020-10-17 LAB — BASIC METABOLIC PANEL
Anion gap: 8 (ref 5–15)
BUN: 19 mg/dL (ref 8–23)
CO2: 24 mmol/L (ref 22–32)
Calcium: 8.9 mg/dL (ref 8.9–10.3)
Chloride: 100 mmol/L (ref 98–111)
Creatinine, Ser: 1.25 mg/dL — ABNORMAL HIGH (ref 0.61–1.24)
GFR, Estimated: >60 mL/min (ref >60)
Glucose, Bld: 227 mg/dL — ABNORMAL HIGH (ref 70–99)
Potassium: 4.7 mmol/L (ref 3.5–5.1)
Sodium: 132 mmol/L — ABNORMAL LOW (ref 135–145)

## 2020-10-17 LAB — TROPONIN I (HIGH SENSITIVITY)
Troponin I (High Sensitivity): 64 ng/L — ABNORMAL HIGH (ref <18)
Troponin I (High Sensitivity): 88 ng/L — ABNORMAL HIGH (ref <18)

## 2020-10-17 MED ORDER — SODIUM CHLORIDE 0.9 % IV BOLUS
500.0000 mL | Freq: Once | INTRAVENOUS | Status: AC
  Administered 2020-10-17: 500 mL via INTRAVENOUS

## 2020-10-17 MED ORDER — TAMSULOSIN HCL 0.4 MG PO CAPS
0.4000 mg | ORAL_CAPSULE | Freq: Every day | ORAL | Status: DC
  Administered 2020-10-18 – 2020-10-19 (×2): 0.4 mg via ORAL
  Filled 2020-10-17 (×3): qty 1

## 2020-10-17 MED ORDER — HEPARIN (PORCINE) 25000 UT/250ML-% IV SOLN
1200.0000 [IU]/h | INTRAVENOUS | Status: DC
  Administered 2020-10-17: 1000 [IU]/h via INTRAVENOUS
  Filled 2020-10-17: qty 250

## 2020-10-17 MED ORDER — IOHEXOL 350 MG/ML SOLN
100.0000 mL | Freq: Once | INTRAVENOUS | Status: AC | PRN
  Administered 2020-10-17: 80 mL via INTRAVENOUS

## 2020-10-17 MED ORDER — HEPARIN SODIUM (PORCINE) 5000 UNIT/ML IJ SOLN
4000.0000 [IU] | Freq: Once | INTRAMUSCULAR | Status: AC
  Administered 2020-10-17: 4000 [IU] via INTRAVENOUS
  Filled 2020-10-17: qty 1

## 2020-10-17 MED ORDER — ATORVASTATIN CALCIUM 80 MG PO TABS
80.0000 mg | ORAL_TABLET | Freq: Every day | ORAL | Status: DC
  Administered 2020-10-17 – 2020-10-20 (×4): 80 mg via ORAL
  Filled 2020-10-17 (×4): qty 1

## 2020-10-17 MED ORDER — FLUTICASONE PROPIONATE 50 MCG/ACT NA SUSP
1.0000 | Freq: Every day | NASAL | Status: DC
  Administered 2020-10-18 – 2020-10-20 (×3): 1 via NASAL
  Filled 2020-10-17: qty 16

## 2020-10-17 MED ORDER — LEVOCETIRIZINE DIHYDROCHLORIDE 5 MG PO TABS: 5.0000 mg | ORAL_TABLET | Freq: Every evening | ORAL | Status: DC

## 2020-10-17 MED ORDER — VITAMIN D 25 MCG (1000 UNIT) PO TABS
2000.0000 [IU] | ORAL_TABLET | Freq: Every day | ORAL | Status: DC
  Administered 2020-10-18 – 2020-10-20 (×3): 2000 [IU] via ORAL
  Filled 2020-10-17 (×3): qty 2

## 2020-10-17 MED ORDER — AZELASTINE HCL 0.1 % NA SOLN
2.0000 | Freq: Two times a day (BID) | NASAL | Status: DC | PRN
  Administered 2020-10-18: 2 via NASAL
  Filled 2020-10-17: qty 30

## 2020-10-17 MED ORDER — ACETAMINOPHEN 325 MG PO TABS
650.0000 mg | ORAL_TABLET | ORAL | Status: DC | PRN
  Administered 2020-10-18: 650 mg via ORAL
  Filled 2020-10-17: qty 2

## 2020-10-17 MED ORDER — ASPIRIN 81 MG PO CHEW
324.0000 mg | CHEWABLE_TABLET | Freq: Once | ORAL | Status: AC
  Administered 2020-10-17: 324 mg via ORAL
  Filled 2020-10-17: qty 4

## 2020-10-17 MED ORDER — NITROGLYCERIN 0.4 MG SL SUBL: 0.4000 mg | SUBLINGUAL_TABLET | SUBLINGUAL | Status: DC | PRN

## 2020-10-17 MED ORDER — TRAZODONE HCL 100 MG PO TABS
100.0000 mg | ORAL_TABLET | Freq: Every day | ORAL | Status: DC
  Administered 2020-10-17 – 2020-10-19 (×3): 100 mg via ORAL
  Filled 2020-10-17 (×3): qty 1

## 2020-10-17 MED ORDER — ONDANSETRON HCL 4 MG/2ML IJ SOLN: 4.0000 mg | Freq: Four times a day (QID) | INTRAMUSCULAR | Status: DC | PRN

## 2020-10-17 MED ORDER — PANTOPRAZOLE SODIUM 40 MG PO TBEC
40.0000 mg | DELAYED_RELEASE_TABLET | Freq: Every day | ORAL | Status: DC
  Administered 2020-10-18 – 2020-10-20 (×3): 40 mg via ORAL
  Filled 2020-10-17 (×3): qty 1

## 2020-10-17 MED ORDER — BUPROPION HCL ER (XL) 150 MG PO TB24
300.0000 mg | ORAL_TABLET | Freq: Every day | ORAL | Status: DC
  Administered 2020-10-17 – 2020-10-19 (×3): 300 mg via ORAL
  Filled 2020-10-17 (×3): qty 2

## 2020-10-17 MED ORDER — LORATADINE 10 MG PO TABS
10.0000 mg | ORAL_TABLET | Freq: Every day | ORAL | Status: DC
  Administered 2020-10-18 – 2020-10-20 (×3): 10 mg via ORAL
  Filled 2020-10-17 (×3): qty 1

## 2020-10-17 MED ORDER — AMPHETAMINE-DEXTROAMPHETAMINE 10 MG PO TABS: 20.0000 mg | ORAL_TABLET | Freq: Every day | ORAL | Status: DC

## 2020-10-17 MED ORDER — METOPROLOL SUCCINATE ER 25 MG PO TB24
25.0000 mg | ORAL_TABLET | Freq: Every day | ORAL | Status: DC
  Administered 2020-10-18 – 2020-10-19 (×2): 25 mg via ORAL
  Filled 2020-10-17 (×2): qty 1

## 2020-10-17 MED ORDER — ASPIRIN EC 81 MG PO TBEC
81.0000 mg | DELAYED_RELEASE_TABLET | Freq: Every day | ORAL | Status: DC
  Administered 2020-10-18 – 2020-10-20 (×3): 81 mg via ORAL
  Filled 2020-10-17 (×3): qty 1

## 2020-10-17 NOTE — ED Triage Notes (Signed)
Chest pain onset today, no history, states his pain radiates into left arm

## 2020-10-17 NOTE — ED Provider Notes (Signed)
[facility] EMERGENCY DEPARTMENT Provider Note   CSN: [id number] Arrival date & time: 10/17/20  1555     History Chief Complaint  Patient presents with   Chest Pain    Justin Carroll is a 65 y.o. male.  HPI Justin Carroll presents for evaluation of pain in the upper chest, bilaterally, which started during a meeting with clients.  The pain did not start abruptly but gradually got worse over about 10 minutes.  Justin Carroll was concerned, so walked down the hall to an office where his wife was.  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  She states that "Justin Carroll looked pale."  Justin Carroll then sat down and they decided to come to the hospital because his pain continued.  Justin Carroll is unable to describe the pain, but was worried about it so Justin Carroll came here.  Justin Carroll states Justin Carroll had episode of heartburn that lasted for 3 hours and kept him awake during the night.  Justin Carroll is on PPI, and intermittently has heartburn for which Justin Carroll takes Tums.  Justin Carroll states that this pain does not feel like his typical heartburn symptoms.  Justin Carroll denies recent fever, chills, nausea or vomiting.  Justin Carroll did have some nasal congestion and had to clear his throat numerous times today but did not produce sputum or cough.  Justin Carroll denies back pain, abdominal pain, headache, blurred vision, difficulty speaking, difficulty walking, change in sensation of hearing or tasting today.  No prior similar problems.  Justin Carroll does not take blood pressure medications.  Justin Carroll has previously been evaluated for stroke, when Justin Carroll had nystagmus, and it was determined that Justin Carroll probably did not have a stroke.  Justin Carroll was also evaluated for dementia, but since his symptoms improved the neurologist did not think that Justin Carroll had dementia.  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  There are no other known active modifying factors.    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    Past Surgical History:  Procedure Laterality Date   ADENOIDECTOMY     NASAL SEPTUM SURGERY     SINOSCOPY     TONSILLECTOMY     age 43       Family History  Problem Relation Age of Onset   Allergic rhinitis Brother    Colon cancer Neg Hx    Pancreatic cancer Neg Hx    Rectal cancer Neg Hx    Stomach cancer Neg Hx     Social History   Tobacco Use   Smoking status: Former    Packs/day: 1.50    Years: 10.00    Pack years: 15.00    Types: Cigarettes    Start date: 1995    Quit date: 2015    Years since quitting: 7.7   Smokeless tobacco: Never  Vaping Use   Vaping Use: Never used  Substance Use Topics   Alcohol use: No   Drug use: No    Home Medications Prior to Admission medications   Medication Sig Start Date End Date Taking? Authorizing Provider  amphetamine-dextroamphetamine (  ADDERALL) 20 MG tablet Take 1 tablet in the morning and additional tablet if needed at lunch 09/14/20  Yes Lomax, Amy, NP  Azelastine HCl 137 MCG/SPRAY SOLN USE 2 SPRAY(S) IN EACH NOSTRIL TWICE DAILY AS NEEDED FOR RHINITIS Patient taking differently: Place 2 sprays into both nostrils 2 (two) times daily as needed (nasal congestion). 09/21/20  Yes Valentina Shaggy, MD  buPROPion (WELLBUTRIN XL) 300 MG 24 hr tablet Take 1 tablet by mouth once daily 08/21/20  Yes Gosrani, Nimish C, MD  Cholecalciferol (VITAMIN D-3) 25 MCG (1000 UT) CAPS Take 2 capsules by mouth daily.   Yes [provider]  levocetirizine (XYZAL) 5 MG tablet Take 5 mg by mouth every evening.   Yes [provider]  mometasone (NASONEX) 50 MCG/ACT nasal spray Place 1 spray into the nose daily.  12/08/19  Yes Valentina Shaggy, MD  pantoprazole (PROTONIX) 40 MG tablet Take 1 tablet by mouth once daily 08/21/20  Yes Gosrani, Nimish C, MD  tamsulosin (FLOMAX) 0.4 MG CAPS capsule TAKE 1 CAPSULE BY MOUTH ONCE DAILY AFTER SUPPER Patient taking differently: Take 0.4 mg by mouth daily after supper. 08/21/20  Yes Gosrani, Nimish C, MD  testosterone cypionate (DEPOTESTOSTERONE CYPIONATE) 200 MG/ML injection INJECT 0.5 MLS INTO THE MUSCLE TWICE A WEEK. 02/28/20  Yes Gosrani, Nimish C, MD  traZODone (DESYREL) 100 MG tablet TAKE 1 TABLET BY MOUTH AT BEDTIME 08/21/20  Yes Gosrani, Nimish C, MD  predniSONE (DELTASONE) 20 MG tablet Take 40 mg by mouth every morning. Patient not taking: No sig reported 09/21/20   [provider]    Allergies    Patient has no known allergies.  Review of Systems   Review of Systems  All other systems reviewed and are negative.  Physical Exam Updated Vital Signs BP (!) 153/92   Pulse 85   Temp 98.1 F (36.7 C) (Oral)   Resp 14   SpO2 96%   Physical Exam Vitals and nursing note reviewed.  Constitutional:      General: Justin Carroll is not in acute distress.    Appearance: Justin Carroll is well-developed. Justin Carroll is not toxic-appearing or diaphoretic.  HENT:     Head: Normocephalic and atraumatic.     Right Ear: External ear normal.     Left Ear: External ear normal.     Mouth/Throat:     Mouth: Mucous membranes are moist.  Eyes:     Conjunctiva/sclera: Conjunctivae normal.     Pupils: Pupils are equal, round, and reactive to light.  Neck:     Trachea: Phonation normal.  Cardiovascular:     Rate and Rhythm: Normal rate and regular rhythm.     Heart sounds: Normal heart sounds.  Pulmonary:     Effort: Pulmonary effort is normal.     Breath sounds: Normal breath sounds.  Abdominal:     General: There is no distension.     Palpations: Abdomen is soft.     Tenderness: There is no abdominal tenderness.  Musculoskeletal:        General: Normal range of motion.      Cervical back: Normal range of motion and neck supple.     Right lower leg: No edema.     Left lower leg: No edema.  Skin:    General: Skin is warm and dry.  Neurological:     Mental Status: Justin Carroll is alert and oriented to person, place, and time.     Cranial Nerves: No cranial nerve deficit.  Sensory: No sensory deficit.     Motor: No abnormal muscle tone.     Coordination: Coordination normal.  Psychiatric:        Mood and Affect: Mood normal.        Behavior: Behavior normal.        Thought Content: Thought content normal.        Judgment: Judgment normal.    ED Results / Procedures / Treatments   Labs (all labs ordered are listed, but only abnormal results are displayed) Labs Reviewed  BASIC METABOLIC PANEL - Abnormal; Notable for the following components:      Result Value   Sodium 132 (*)    Glucose, Bld 227 (*)    Creatinine, Ser 1.25 (*)    All other components within normal limits  CBC - Abnormal; Notable for the following components:   RBC 6.40 (*)    Hemoglobin 18.0 (*)    HCT 57.3 (*)    RDW 17.1 (*)    All other components within normal limits  TROPONIN I (HIGH SENSITIVITY) - Abnormal; Notable for the following components:   Troponin I (High Sensitivity) 64 (*)    All other components within normal limits  TROPONIN I (HIGH SENSITIVITY) - Abnormal; Notable for the following components:   Troponin I (High Sensitivity) 88 (*)    All other components within normal limits  RESP PANEL BY RT-PCR (FLU A&B, COVID) [id number]  HEPATIC FUNCTION PANEL    EKG EKG Interpretation  Date/Time:  Tuesday October 17 2020 16:26:03 EDT Ventricular Rate:  81 PR Interval:  135 QRS Duration: 92 QT Interval:  355 QTC Calculation: 412 R Axis:   12 Text Interpretation: Sinus rhythm Abnormal R-wave progression, early transition ST elevation suggests acute pericarditis Since last tracing of earlier today No significant change was found Confirmed by Daleen Bo [id number]) on  10/17/2020 4:46:11 PM  Radiology DG Chest 2 View  Result Date: 10/17/2020 CLINICAL DATA:  Chest pain. EXAM: CHEST - 2 VIEW COMPARISON:  None. FINDINGS: The heart size and mediastinal contours are within normal limits. Both lungs are clear. The visualized skeletal structures are unremarkable. IMPRESSION: No active cardiopulmonary disease. Electronically Signed   By: Marijo Conception M.D.   On: 10/17/2020 16:55   CT Angio Chest/Abd/Pel for Dissection W and/or Wo Contrast  Result Date: 10/17/2020 CLINICAL DATA:  Abdominal pain, aortic dissection suspected. that radiates from left side of chest down left arm that began earlier toda EXAM: CT ANGIOGRAPHY CHEST, ABDOMEN AND PELVIS TECHNIQUE: Non-contrast CT of the chest was initially obtained. Multidetector CT imaging through the chest, abdomen and pelvis was performed using the standard protocol during bolus administration of intravenous contrast. Multiplanar reconstructed images and MIPs were obtained and reviewed to evaluate the vascular anatomy. CONTRAST:  4m OMNIPAQUE IOHEXOL 350 MG/ML SOLN COMPARISON:  None. FINDINGS: CTA CHEST FINDINGS Cardiovascular: Preferential opacification of the thoracic aorta. No evidence of thoracic aortic aneurysm or dissection. Mild atherosclerotic plaque of the thoracic aorta. Normal heart size. No significant pericardial effusion.No coronary artery calcifications. The main pulmonary artery is normal in caliber with no central pulmonary embolus. Mediastinum/Nodes: No enlarged mediastinal, hilar, or axillary lymph nodes. Thyroid gland, trachea, and esophagus demonstrate no significant findings. Lungs/Pleura: Right upper lobe ground-glass airspace opacity measuring up to 0.6 cm (7:70). Subpleural left lower lobe 4 mm pulmonary micronodule (7:88). No focal consolidation. No pulmonary mass. No pleural effusion. No pneumothorax. Musculoskeletal: No chest wall abnormality. No suspicious lytic or blastic osseous lesions. No acute  displaced fracture. Mild compression fracture of the T12 vertebral body with a prominent superior endplate Schmorl node. Large inferior endplate Schmorl noted the T9 vertebral body. Review of the MIP images confirms the above findings. CTA ABDOMEN AND PELVIS FINDINGS VASCULAR Aorta: Mild atherosclerotic plaque. Normal caliber aorta without aneurysm, dissection, vasculitis or significant stenosis. Poorly visualized narrowing of the origin of the left common carotid artery. Incidentally noted replaced left vertebral artery with origin off of the aortic arch just distal to the left common carotid artery origin. Celiac: Patent without evidence of aneurysm, dissection, vasculitis or significant stenosis. SMA: Patent without evidence of aneurysm, dissection, vasculitis or significant stenosis. Renals: Both renal arteries are patent without evidence of aneurysm, dissection, vasculitis, fibromuscular dysplasia or significant stenosis. IMA: Patent without evidence of aneurysm, dissection, vasculitis or significant stenosis. Inflow: Patent without evidence of aneurysm, dissection, vasculitis or significant stenosis. Veins: No obvious venous abnormality within the limitations of this arterial phase study. Review of the MIP images confirms the above findings. NON-VASCULAR Hepatobiliary: Subcentimeter hypodensity too small to characterize (5:86). No focal liver abnormality. No gallstones, gallbladder wall thickening, or pericholecystic fluid. No biliary dilatation. Pancreas: No focal lesion. Normal pancreatic contour. No surrounding inflammatory changes. No main pancreatic ductal dilatation. Spleen: Normal in size without focal abnormality. Adrenals/Urinary Tract: No adrenal nodule bilaterally. Bilateral kidneys enhance symmetrically. There is a 3.6 cm fluid density lesion within left kidney that likely represents a simple renal cyst. No hydronephrosis. No hydroureter. The urinary bladder is unremarkable. Stomach/Bowel: Stomach  is within normal limits. No evidence of bowel wall thickening or dilatation. Scattered colonic diverticulosis. Appendix appears normal. Lymphatic: No lymphadenopathy. Reproductive: Prostate is enlarged measuring up to 5 cm. Other: No intraperitoneal free fluid. No intraperitoneal free gas. No organized fluid collection. Musculoskeletal: Tiny fat containing right inguinal hernia. No suspicious lytic or blastic osseous lesions. No acute displaced fracture. Multilevel degenerative changes of the spine. Age-indeterminate compression fracture of the L2 vertebral body with greater than 60% height loss. Review of the MIP images confirms the above findings. IMPRESSION: 1. No aortic dissection or aneurysm. Aortic Atherosclerosis (ICD10-I70.0) - mild. 2. Poorly visualized narrowing of the origin of the left common carotid artery. Versus artifact due to motion and streak artifact. 3. 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 4. A right upper lobe 6 mm ground-glass nodular airspace opacity. Initial follow-up with CT at 6-12 months is recommended to confirm persistence. If persistent, repeat CT is recommended every 2 years until 5 years of stability has been established. This recommendation follows the consensus statement: Guidelines for Management of Incidental Pulmonary Nodules Detected on CT Images: From the Fleischner Society 2017; Radiology 2017; 284:228-243. 5. Prostatomegaly. 6. Age-indeterminate compression fracture of the T12 and L2 vertebral bodies. Electronically Signed   By: Iven Finn M.D.   On: 10/17/2020 18:32    Procedures .Critical Care Performed by: Daleen Bo, MD Authorized by: Daleen Bo, MD   Critical care provider statement:    Critical care time (minutes):  35   Critical care start time:  10/17/2020 4:20 PM   Critical care end time:  10/17/2020 8:30 PM   Critical care time was exclusive of:  Separately billable procedures and treating other  patients   Critical care was time spent personally by me on the following activities:  Blood draw for specimens, development of treatment plan with patient or surrogate, discussions with consultants, evaluation of patient's response to treatment, examination of patient, obtaining history from patient or surrogate,  ordering and performing treatments and interventions, ordering and review of laboratory studies, pulse oximetry, re-evaluation of patient's condition, review of old charts and ordering and review of radiographic studies   Medications Ordered in ED Medications  aspirin chewable tablet 324 mg (has no administration in time range)  heparin injection 60 Units/kg (has no administration in time range)  sodium chloride 0.9 % bolus 500 mL (0 mLs Intravenous Stopped 10/17/20 1840)  iohexol (OMNIPAQUE) 350 MG/ML injection 100 mL (80 mLs Intravenous Contrast Given 10/17/20 1811)    ED Course  I have reviewed the triage vital signs and the nursing notes.  Pertinent labs & imaging results that were available during my care of the patient were reviewed by me and considered in my medical decision making (see chart for details).  Clinical Course as of 10/18/20 1011  Tue Oct 17, 2020  1915 Justin Carroll states that his chest discomfort has resolved spontaneously, now. [EW]  1916 Awaiting callback from cardiology to discuss troponin elevation and spectated NSTEMI. [EW]  1923 Case discussed with cardiologist, Dr. Nechama Guard, at [facility] in [place], [place].  Justin Carroll accepts patient for transfer there to the cardiac telemetry unit.  I have requested that the patient be transferred by [place]. [EW]    Clinical Course User Index [EW] Daleen Bo, MD   MDM Rules/Calculators/A&P                            Patient Vitals for the past 24 hrs:  BP Temp Temp src Pulse Resp SpO2  10/17/20 1830 (!) 153/92 -- -- 85 14 96 %  10/17/20 1730 (!) 142/88 -- -- 83 16 96 %  10/17/20 1700 (!) 151/96 -- -- 81 16 97  %  10/17/20 1630 (!) 149/106 -- -- 85 (!) 22 95 %  10/17/20 1607 (!) 154/94 98.1 F (36.7 C) Oral 77 18 96 %    7:29 PM Reevaluation with update and discussion. After initial assessment and treatment, an updated evaluation reveals Justin Carroll remains pain-free.  Justin Carroll is agreeable to transfer for treatment and management. Daleen Bo   Medical Decision Making:  This patient is presenting for evaluation of chest discomfort, which does require a range of treatment options, and is a complaint that involves a high risk of morbidity and mortality. The differential diagnoses include ACS, chest wall pain, esophagitis. I decided to review old records, and in summary elderly male presenting with chest pain, which occurred while working today.  No prior cardiac history..  I did not require additional historical information from anyone.  Clinical Laboratory Tests Ordered, included CBC, Metabolic panel, and delta troponin . Review indicates climbing troponin, otherwise normal. Radiologic Tests Ordered, included chest x-ray.  I independently Visualized: Radiographic images, which show no acute abnormality  Cardiac Monitor Tracing which shows normal sinus rhythm    Critical Interventions-clinical evaluation, laboratory testing, EKG, cardiac monitor, chest x-ray, secondary advanced imaging ordered, observation and reassessment.  Initiation of heparin, treatment.  Case discussed with cardiology to arrange for transfer and admission  After These Interventions, the Patient was reevaluated and was found with NSTEMI.  Initial presentation, unclear, requiring screening for aortic dissection.  Mild hypertension persists, NSTEMI present by high-sensitivity troponin.  Repeat EKG concerning for PR depression/pericarditis presentation.  Patient may require cardiac echo to evaluate for myocarditis.  Possible anomalous origin of arterial supply to the right neck.  Unclear if this is involved with his discomfort.  No neurologic  symptoms of  concern.  Doubt carotid dissection.  Patient requires evaluation management by cardiology services, will stabilize and transfer as soon as possible  CRITICAL CARE-yes Performed by: Daleen Bo  Nursing Notes Reviewed/ Care Coordinated Applicable Imaging Reviewed Interpretation of Laboratory Data incorporated into ED treatment     Final Clinical Impression(s) / ED Diagnoses Final diagnoses:  NSTEMI (non-ST elevated myocardial infarction) [facility])    Rx / [facility] Orders ED Discharge Orders     None        Daleen Bo, MD 10/18/20 1014

## 2020-10-17 NOTE — Progress Notes (Signed)
ANTICOAGULATION CONSULT NOTE - Initial Consult  Pharmacy Consult for Heparin Indication: chest pain/ACS  No Known Allergies  Patient Measurements: Height: '5\' 7"'$  (170.2 cm) Weight: 84.8 kg (187 lb) IBW/kg (Calculated) : 66.1 Heparin Dosing Weight: 83 kg  Vital Signs: Temp: 98.1 F (36.7 C) (09/13 1607) Temp Source: Oral (09/13 1607) BP: 153/92 (09/13 1830) Pulse Rate: 85 (09/13 1830)  Labs: Recent Labs    10/17/20 1621 10/17/20 1801  HGB 18.0*  --   HCT 57.3*  --   PLT 255  --   CREATININE 1.25*  --   TROPONINIHS 64* 88*    Estimated Creatinine Clearance: 60.5 mL/min (A) (by C-G formula based on SCr of 1.25 mg/dL (H)).   Medical History: Past Medical History:  Diagnosis Date   Depression    Sinus infection    yearly    Medications:  Not on Anticoagulation prior to admit.  Assessment: 65 y.o male presented to ED with onset of chest pain.  Pharmacy consulted to start IV heparin infusion for ACS.   Goal of Therapy:  Heparin level 0.3-0.7 units/ml Monitor platelets by anticoagulation protocol: Yes   Plan:  Give 4000 units bolus x 1 Start heparin infusion at 1000 units/hr Check anti-Xa level in 6 hours and daily while on heparin Continue to monitor H&H and platelets  Nicole Cella, RPh Clinical Pharmacist 10/17/2020,7:50 PM

## 2020-10-17 NOTE — ED Notes (Signed)
Wife at bedside, updated on plan of care.

## 2020-10-17 NOTE — Plan of Care (Signed)
  Problem: Health Behavior/Discharge Planning: Goal: Ability to manage health-related needs will improve Outcome: Progressing   Problem: Clinical Measurements: Goal: Ability to maintain clinical measurements within normal limits will improve Outcome: Progressing   Problem: Clinical Measurements: Goal: Cardiovascular complication will be avoided Outcome: Progressing   Problem: Pain Managment: Goal: General experience of comfort will improve Outcome: Progressing   Problem: Safety: Goal: Ability to remain free from injury will improve Outcome: Progressing   Problem: Skin Integrity: Goal: Risk for impaired skin integrity will decrease Outcome: Progressing

## 2020-10-17 NOTE — ED Notes (Signed)
Pt here with reports of sudden onset left side chest pain starting approx noon today while he was in a work meeting. Pt rates pain 3/10. Pt placed on cardiac monitor, repeat EKG

## 2020-10-18 ENCOUNTER — Encounter (HOSPITAL_COMMUNITY)
Admission: EM | Disposition: A | Discharge status: Home / Self Care | Payer: Self-pay | Source: Home / Self Care | Attending: Cardiology

## 2020-10-18 ENCOUNTER — Other Ambulatory Visit: Payer: Self-pay

## 2020-10-18 ENCOUNTER — Inpatient Hospital Stay (HOSPITAL_COMMUNITY): Payer: Medicare Other

## 2020-10-18 DIAGNOSIS — I2510 Atherosclerotic heart disease of native coronary artery without angina pectoris: Secondary | ICD-10-CM

## 2020-10-18 DIAGNOSIS — R7989 Other specified abnormal findings of blood chemistry: Secondary | ICD-10-CM

## 2020-10-18 DIAGNOSIS — R079 Chest pain, unspecified: Secondary | ICD-10-CM

## 2020-10-18 DIAGNOSIS — I214 Non-ST elevation (NSTEMI) myocardial infarction: Principal | ICD-10-CM

## 2020-10-18 HISTORY — PX: LEFT HEART CATH AND CORONARY ANGIOGRAPHY: CATH118249

## 2020-10-18 HISTORY — PX: CORONARY STENT INTERVENTION: CATH118234

## 2020-10-18 HISTORY — PX: INTRAVASCULAR IMAGING/OCT: CATH118326

## 2020-10-18 LAB — POCT ACTIVATED CLOTTING TIME: Activated Clotting Time: 271 seconds

## 2020-10-18 LAB — BASIC METABOLIC PANEL
Anion gap: 8 (ref 5–15)
BUN: 18 mg/dL (ref 8–23)
CO2: 24 mmol/L (ref 22–32)
Calcium: 8.4 mg/dL — ABNORMAL LOW (ref 8.9–10.3)
Chloride: 102 mmol/L (ref 98–111)
Creatinine, Ser: 1.23 mg/dL (ref 0.61–1.24)
GFR, Estimated: >60 mL/min (ref >60)
Glucose, Bld: 165 mg/dL — ABNORMAL HIGH (ref 70–99)
Potassium: 4.1 mmol/L (ref 3.5–5.1)
Sodium: 134 mmol/L — ABNORMAL LOW (ref 135–145)

## 2020-10-18 LAB — CBC
HCT: 51.2 % (ref 39.0–52.0)
Hemoglobin: 16.5 g/dL (ref 13.0–17.0)
MCH: 28 pg (ref 26.0–34.0)
MCHC: 32.2 g/dL (ref 30.0–36.0)
MCV: 86.9 fL (ref 80.0–100.0)
Platelets: 253 10*3/uL (ref 150–400)
RBC: 5.89 MIL/uL — ABNORMAL HIGH (ref 4.22–5.81)
RDW: 15.5 % (ref 11.5–15.5)
WBC: 10 10*3/uL (ref 4.0–10.5)
nRBC: 0 % (ref 0.0–0.2)

## 2020-10-18 LAB — LIPID PANEL
Cholesterol: 191 mg/dL (ref 0–200)
HDL: 61 mg/dL (ref >40)
LDL Cholesterol: 106 mg/dL — ABNORMAL HIGH (ref 0–99)
Total CHOL/HDL Ratio: 3.1 RATIO
Triglycerides: 120 mg/dL (ref <150)
VLDL: 24 mg/dL (ref 0–40)

## 2020-10-18 LAB — ECHOCARDIOGRAM COMPLETE
AR max vel: 2.57 cm2
AV Area VTI: 2.59 cm2
AV Area mean vel: 2.39 cm2
AV Mean grad: 2 mmHg
AV Peak grad: 4.5 mmHg
Ao pk vel: 1.06 m/s
Area-P 1/2: 4.31 cm2
Calc EF: 40.6 %
Height: 67 in
S' Lateral: 3.3 cm
Single Plane A2C EF: 45.9 %
Single Plane A4C EF: 42.1 %
Weight: 2873.6 oz

## 2020-10-18 LAB — HEPARIN LEVEL (UNFRACTIONATED)
Heparin Unfractionated: 0.36 IU/mL (ref 0.30–0.70)
Heparin Unfractionated: 0.23 IU/mL — ABNORMAL LOW (ref 0.30–0.70)

## 2020-10-18 LAB — TSH: TSH: 0.592 u[IU]/mL (ref 0.350–4.500)

## 2020-10-18 LAB — HEMOGLOBIN A1C
Hgb A1c MFr Bld: 6.3 % — ABNORMAL HIGH (ref 4.8–5.6)
Mean Plasma Glucose: 134.11 mg/dL

## 2020-10-18 LAB — GLUCOSE, CAPILLARY
Glucose-Capillary: 98 mg/dL (ref 70–99)
Glucose-Capillary: 90 mg/dL (ref 70–99)
Glucose-Capillary: 99 mg/dL (ref 70–99)

## 2020-10-18 LAB — HIV ANTIBODY (ROUTINE TESTING W REFLEX): HIV Screen 4th Generation wRfx: NONREACTIVE

## 2020-10-18 LAB — TROPONIN I (HIGH SENSITIVITY)
Troponin I (High Sensitivity): 3791 ng/L (ref <18)
Troponin I (High Sensitivity): 3200 ng/L (ref <18)

## 2020-10-18 SURGERY — LEFT HEART CATH AND CORONARY ANGIOGRAPHY
Anesthesia: LOCAL

## 2020-10-18 MED ORDER — FENTANYL CITRATE (PF) 100 MCG/2ML IJ SOLN
INTRAMUSCULAR | Status: DC | PRN
  Administered 2020-10-18: 50 ug via INTRAVENOUS

## 2020-10-18 MED ORDER — VERAPAMIL HCL 2.5 MG/ML IV SOLN
INTRAVENOUS | Status: AC
  Filled 2020-10-18: qty 2

## 2020-10-18 MED ORDER — NITROGLYCERIN 1 MG/10 ML FOR IR/CATH LAB
INTRA_ARTERIAL | Status: AC
  Filled 2020-10-18: qty 10

## 2020-10-18 MED ORDER — SODIUM CHLORIDE 0.9% FLUSH
3.0000 mL | Freq: Two times a day (BID) | INTRAVENOUS | Status: DC
  Administered 2020-10-19: 3 mL via INTRAVENOUS

## 2020-10-18 MED ORDER — IOHEXOL 350 MG/ML SOLN
INTRAVENOUS | Status: DC | PRN
  Administered 2020-10-18: 110 mL

## 2020-10-18 MED ORDER — SODIUM CHLORIDE 0.9 % WEIGHT BASED INFUSION
3.0000 mL/kg/h | INTRAVENOUS | Status: DC
  Administered 2020-10-18: 3 mL/kg/h via INTRAVENOUS

## 2020-10-18 MED ORDER — MIDAZOLAM HCL 2 MG/2ML IJ SOLN
INTRAMUSCULAR | Status: DC | PRN
  Administered 2020-10-18: 1 mg via INTRAVENOUS

## 2020-10-18 MED ORDER — SODIUM CHLORIDE 0.9% FLUSH: 3.0000 mL | INTRAVENOUS | Status: DC | PRN

## 2020-10-18 MED ORDER — INSULIN ASPART 100 UNIT/ML IJ SOLN: 0.0000 [IU] | Freq: Every day | INTRAMUSCULAR | Status: DC

## 2020-10-18 MED ORDER — SODIUM CHLORIDE 0.9 % WEIGHT BASED INFUSION
1.0000 mL/kg/h | INTRAVENOUS | Status: DC
  Administered 2020-10-18: 1 mL/kg/h via INTRAVENOUS

## 2020-10-18 MED ORDER — HEPARIN SODIUM (PORCINE) 1000 UNIT/ML IJ SOLN
INTRAMUSCULAR | Status: AC
  Filled 2020-10-18: qty 1

## 2020-10-18 MED ORDER — SODIUM CHLORIDE 0.9 % IV SOLN: 250.0000 mL | INTRAVENOUS | Status: DC | PRN

## 2020-10-18 MED ORDER — TICAGRELOR 90 MG PO TABS
ORAL_TABLET | ORAL | Status: DC | PRN
  Administered 2020-10-18: 180 mg via ORAL

## 2020-10-18 MED ORDER — FENTANYL CITRATE (PF) 100 MCG/2ML IJ SOLN
INTRAMUSCULAR | Status: AC
  Filled 2020-10-18: qty 2

## 2020-10-18 MED ORDER — TICAGRELOR 90 MG PO TABS
90.0000 mg | ORAL_TABLET | Freq: Two times a day (BID) | ORAL | Status: DC
  Administered 2020-10-19 – 2020-10-20 (×4): 90 mg via ORAL
  Filled 2020-10-18 (×4): qty 1

## 2020-10-18 MED ORDER — HEPARIN (PORCINE) IN NACL 1000-0.9 UT/500ML-% IV SOLN
INTRAVENOUS | Status: DC | PRN
  Administered 2020-10-18 (×2): 500 mL

## 2020-10-18 MED ORDER — INSULIN ASPART 100 UNIT/ML IJ SOLN
0.0000 [IU] | Freq: Three times a day (TID) | INTRAMUSCULAR | Status: DC
  Administered 2020-10-19 – 2020-10-20 (×2): 1 [IU] via SUBCUTANEOUS

## 2020-10-18 MED ORDER — VERAPAMIL HCL 2.5 MG/ML IV SOLN
INTRAVENOUS | Status: DC | PRN
  Administered 2020-10-18: 10 mL via INTRA_ARTERIAL

## 2020-10-18 MED ORDER — AMPHETAMINE-DEXTROAMPHETAMINE 20 MG PO TABS: ORAL_TABLET | ORAL | 0 refills | Status: DC

## 2020-10-18 MED ORDER — NITROGLYCERIN 1 MG/10 ML FOR IR/CATH LAB
INTRA_ARTERIAL | Status: DC | PRN
  Administered 2020-10-18: 200 ug via INTRACORONARY

## 2020-10-18 MED ORDER — TICAGRELOR 90 MG PO TABS
ORAL_TABLET | ORAL | Status: AC
  Filled 2020-10-18: qty 2

## 2020-10-18 MED ORDER — LIDOCAINE HCL (PF) 1 % IJ SOLN
INTRAMUSCULAR | Status: AC
  Filled 2020-10-18: qty 30

## 2020-10-18 MED ORDER — HEPARIN (PORCINE) IN NACL 1000-0.9 UT/500ML-% IV SOLN
INTRAVENOUS | Status: AC
  Filled 2020-10-18: qty 1000

## 2020-10-18 MED ORDER — ENOXAPARIN SODIUM 40 MG/0.4ML IJ SOSY
40.0000 mg | PREFILLED_SYRINGE | INTRAMUSCULAR | Status: DC
  Administered 2020-10-19 – 2020-10-20 (×2): 40 mg via SUBCUTANEOUS
  Filled 2020-10-18 (×2): qty 0.4

## 2020-10-18 MED ORDER — LIDOCAINE HCL (PF) 1 % IJ SOLN
INTRAMUSCULAR | Status: DC | PRN
  Administered 2020-10-18: 2 mL

## 2020-10-18 MED ORDER — NAPHAZOLINE-GLYCERIN 0.012-0.25 % OP SOLN
1.0000 [drp] | Freq: Four times a day (QID) | OPHTHALMIC | Status: DC | PRN
  Administered 2020-10-18: 1 [drp] via OPHTHALMIC
  Administered 2020-10-19: 2 [drp] via OPHTHALMIC
  Filled 2020-10-18: qty 15

## 2020-10-18 MED ORDER — SODIUM CHLORIDE 0.9 % WEIGHT BASED INFUSION
1.0000 mL/kg/h | INTRAVENOUS | Status: AC
  Administered 2020-10-18: 1 mL/kg/h via INTRAVENOUS

## 2020-10-18 MED ORDER — PERFLUTREN LIPID MICROSPHERE
1.0000 mL | INTRAVENOUS | Status: AC | PRN
  Administered 2020-10-18: 2 mL via INTRAVENOUS
  Filled 2020-10-18: qty 10

## 2020-10-18 MED ORDER — HEPARIN SODIUM (PORCINE) 1000 UNIT/ML IJ SOLN
INTRAMUSCULAR | Status: DC | PRN
  Administered 2020-10-18 (×2): 4000 [IU] via INTRAVENOUS

## 2020-10-18 MED ORDER — MIDAZOLAM HCL 2 MG/2ML IJ SOLN
INTRAMUSCULAR | Status: AC
  Filled 2020-10-18: qty 2

## 2020-10-18 MED ORDER — ASPIRIN 81 MG PO CHEW: 81.0000 mg | CHEWABLE_TABLET | ORAL | Status: DC

## 2020-10-18 SURGICAL SUPPLY — 15 items
CATH DRAGONFLY OPTIS 2.7FR (CATHETERS) ×1 IMPLANT
CATH INFINITI 5FR JK (CATHETERS) ×1 IMPLANT
CATH INFINITI JR4 5F (CATHETERS) ×1 IMPLANT
CATH LAUNCHER 6FR EBU3.5 (CATHETERS) ×1 IMPLANT
DEVICE RAD COMP TR BAND LRG (VASCULAR PRODUCTS) ×1 IMPLANT
GLIDESHEATH SLEND SS 6F .021 (SHEATH) ×1 IMPLANT
GUIDEWIRE INQWIRE 1.5J.035X260 (WIRE) IMPLANT
INQWIRE 1.5J .035X260CM (WIRE) ×2
KIT ENCORE 26 ADVANTAGE (KITS) ×1 IMPLANT
KIT HEART LEFT (KITS) ×2 IMPLANT
PACK CARDIAC CATHETERIZATION (CUSTOM PROCEDURE TRAY) ×2 IMPLANT
STENT ONYX FRONTIER 3.5X15 (Permanent Stent) ×1 IMPLANT
TRANSDUCER W/STOPCOCK (MISCELLANEOUS) ×2 IMPLANT
TUBING CIL FLEX 10 FLL-RA (TUBING) ×2 IMPLANT
WIRE RUNTHROUGH .014X180CM (WIRE) ×1 IMPLANT

## 2020-10-18 NOTE — Progress Notes (Addendum)
Progress Note  Patient Name: Justin Carroll Date of Encounter: 10/18/2020  [place] Cardiologist: None  Subjective   No chest pain overnight.   Inpatient Medications    Scheduled Meds:  amphetamine-dextroamphetamine  20 mg Oral Daily   aspirin EC  81 mg Oral Daily   atorvastatin  80 mg Oral Daily   buPROPion  300 mg Oral Daily   cholecalciferol  2,000 Units Oral Daily   fluticasone  1 spray Each Nare Daily   loratadine  10 mg Oral Daily   metoprolol succinate  25 mg Oral Daily   pantoprazole  40 mg Oral Daily   tamsulosin  0.4 mg Oral QPC supper   traZODone  100 mg Oral QHS   Continuous Infusions:  heparin 1,000 Units/hr (10/17/20 2004)   PRN Meds: acetaminophen, azelastine, nitroGLYCERIN, ondansetron (ZOFRAN) IV   Vital Signs    Vitals:   10/17/20 2145 10/17/20 2300 10/17/20 2320 10/18/20 0446  BP: (!) 147/93  (!) 150/80 134/87  Pulse: 86   74  Resp:    18  Temp: 98.3 F (36.8 C)  98.7 F (37.1 C) 98.6 F (37 C)  TempSrc: Oral  Oral Oral  SpO2: 96%  96% 90%  Weight:  82.1 kg    Height:  '5\' 7"'$  (1.702 m)      Intake/Output Summary (Last 24 hours) at 10/18/2020 0813 Last data filed at 10/18/2020 0515 Gross per 24 hour  Intake 120 ml  Output 750 ml  Net -630 ml   Last 3 Weights 10/17/2020 10/17/2020 09/14/2020  Weight (lbs) 181 lb 187 lb 182 lb  Weight (kg) 82.1 kg 84.823 kg 82.555 kg      Telemetry    NSR - Personally Reviewed  ECG    NSR 75bpm - Personally Reviewed  Physical Exam   GEN: No acute distress.   Neck: No JVD Cardiac: RRR, no murmurs, rubs, or gallops.  Respiratory: Clear to auscultation bilaterally. GI: Soft, nontender, non-distended  MS: No edema; No deformity. Neuro:  Nonfocal  Psych: Normal affect   Labs    High Sensitivity Troponin:   Recent Labs  Lab 10/17/20 1621 10/17/20 1801 10/18/20 0546  TROPONINIHS 64* 88* 3,791*      Chemistry Recent Labs  Lab 10/17/20 1621 10/18/20 0016  NA 132* 134*  K 4.7 4.1   CL 100 102  CO2 24 24  GLUCOSE 227* 165*  BUN 19 18  CREATININE 1.25* 1.23  CALCIUM 8.9 8.4*  PROT 7.3  --   ALBUMIN 4.1  --   AST 23  --   ALT 26  --   ALKPHOS 62  --   BILITOT 0.7  --   GFRNONAA >60 >60  ANIONGAP 8 8     Hematology Recent Labs  Lab 10/17/20 1621 10/18/20 0016  WBC 8.0 10.0  RBC 6.40* 5.89*  HGB 18.0* 16.5  HCT 57.3* 51.2  MCV 89.5 86.9  MCH 28.1 28.0  MCHC 31.4 32.2  RDW 17.1* 15.5  PLT 255 253    BNPNo results for input(s): BNP, PROBNP in the last 168 hours.   DDimer No results for input(s): DDIMER in the last 168 hours.   Radiology    DG Chest 2 View  Result Date: 10/17/2020 CLINICAL DATA:  Chest pain. EXAM: CHEST - 2 VIEW COMPARISON:  None. FINDINGS: The heart size and mediastinal contours are within normal limits. Both lungs are clear. The visualized skeletal structures are unremarkable. IMPRESSION: No active cardiopulmonary disease. Electronically Signed  By: Marijo Conception M.D.   On: 10/17/2020 16:55   CT Angio Chest/Abd/Pel for Dissection W and/or Wo Contrast  Result Date: 10/17/2020 CLINICAL DATA:  Abdominal pain, aortic dissection suspected. that radiates from left side of chest down left arm that began earlier toda EXAM: CT ANGIOGRAPHY CHEST, ABDOMEN AND PELVIS TECHNIQUE: Non-contrast CT of the chest was initially obtained. Multidetector CT imaging through the chest, abdomen and pelvis was performed using the standard protocol during bolus administration of intravenous contrast. Multiplanar reconstructed images and MIPs were obtained and reviewed to evaluate the vascular anatomy. CONTRAST:  50m OMNIPAQUE IOHEXOL 350 MG/ML SOLN COMPARISON:  None. FINDINGS: CTA CHEST FINDINGS Cardiovascular: Preferential opacification of the thoracic aorta. No evidence of thoracic aortic aneurysm or dissection. Mild atherosclerotic plaque of the thoracic aorta. Normal heart size. No significant pericardial effusion.No coronary artery calcifications. The main  pulmonary artery is normal in caliber with no central pulmonary embolus. Mediastinum/Nodes: No enlarged mediastinal, hilar, or axillary lymph nodes. Thyroid gland, trachea, and esophagus demonstrate no significant findings. Lungs/Pleura: Right upper lobe ground-glass airspace opacity measuring up to 0.6 cm (7:70). Subpleural left lower lobe 4 mm pulmonary micronodule (7:88). No focal consolidation. No pulmonary mass. No pleural effusion. No pneumothorax. Musculoskeletal: No chest wall abnormality. No suspicious lytic or blastic osseous lesions. No acute displaced fracture. Mild compression fracture of the T12 vertebral body with a prominent superior endplate Schmorl node. Large inferior endplate Schmorl noted the T9 vertebral body. Review of the MIP images confirms the above findings. CTA ABDOMEN AND PELVIS FINDINGS VASCULAR Aorta: Mild atherosclerotic plaque. Normal caliber aorta without aneurysm, dissection, vasculitis or significant stenosis. Poorly visualized narrowing of the origin of the left common carotid artery. Incidentally noted replaced left vertebral artery with origin off of the aortic arch just distal to the left common carotid artery origin. Celiac: Patent without evidence of aneurysm, dissection, vasculitis or significant stenosis. SMA: Patent without evidence of aneurysm, dissection, vasculitis or significant stenosis. Renals: Both renal arteries are patent without evidence of aneurysm, dissection, vasculitis, fibromuscular dysplasia or significant stenosis. IMA: Patent without evidence of aneurysm, dissection, vasculitis or significant stenosis. Inflow: Patent without evidence of aneurysm, dissection, vasculitis or significant stenosis. Veins: No obvious venous abnormality within the limitations of this arterial phase study. Review of the MIP images confirms the above findings. NON-VASCULAR Hepatobiliary: Subcentimeter hypodensity too small to characterize (5:86). No focal liver abnormality. No  gallstones, gallbladder wall thickening, or pericholecystic fluid. No biliary dilatation. Pancreas: No focal lesion. Normal pancreatic contour. No surrounding inflammatory changes. No main pancreatic ductal dilatation. Spleen: Normal in size without focal abnormality. Adrenals/Urinary Tract: No adrenal nodule bilaterally. Bilateral kidneys enhance symmetrically. There is a 3.6 cm fluid density lesion within left kidney that likely represents a simple renal cyst. No hydronephrosis. No hydroureter. The urinary bladder is unremarkable. Stomach/Bowel: Stomach is within normal limits. No evidence of bowel wall thickening or dilatation. Scattered colonic diverticulosis. Appendix appears normal. Lymphatic: No lymphadenopathy. Reproductive: Prostate is enlarged measuring up to 5 cm. Other: No intraperitoneal free fluid. No intraperitoneal free gas. No organized fluid collection. Musculoskeletal: Tiny fat containing right inguinal hernia. No suspicious lytic or blastic osseous lesions. No acute displaced fracture. Multilevel degenerative changes of the spine. Age-indeterminate compression fracture of the L2 vertebral body with greater than 60% height loss. Review of the MIP images confirms the above findings. IMPRESSION: 1. No aortic dissection or aneurysm. Aortic Atherosclerosis (ICD10-I70.0) - mild. 2. Poorly visualized narrowing of the origin of the left common carotid artery. Versus  artifact due to motion and streak artifact. 3. 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 4. A right upper lobe 6 mm ground-glass nodular airspace opacity. Initial follow-up with CT at 6-12 months is recommended to confirm persistence. If persistent, repeat CT is recommended every 2 years until 5 years of stability has been established. This recommendation follows the consensus statement: Guidelines for Management of Incidental Pulmonary Nodules Detected on CT Images: From the Fleischner Society 2017;  Radiology 2017; 284:228-243. 5. Prostatomegaly. 6. Age-indeterminate compression fracture of the T12 and L2 vertebral bodies. Electronically Signed   By: Iven Finn M.D.   On: 10/17/2020 18:32    Cardiac Studies   Echo: pending  Patient Profile     65 y.o. male  with adult ADHD, depression, sinusitis who is being seen 10/18/2020 for the evaluation of chest pain.  Assessment & Plan    NSTEMI: hsTn peaking at 3791. Remains on IV heparin. Reviewed plan for cardiac cath with patient. No chest pain overnight.  -- The patient understands that risks included but are not limited to stroke (1 in 1000), death (1 in 27), kidney failure [usually temporary] (1 in 500), bleeding (1 in 200), allergic reaction [possibly serious] (1 in 200).   -- continue on IV heparin, ASA, statin, and BB -- echo pending  HLD: LDL 106 -- started on high dose statin  DM: Hgb A1c 6.3 -- SSI while inpatient  ADHD: on adderall PTA, will hold now with concerns for ACS  Depression: on Wellbutrin  For questions or updates, please contact [place] Please consult [email or web address] for contact info under        Signed, Reino Bellis, NP  10/18/2020, 8:13 AM     Agree with note by Reino Bellis NP-C  [name] was admitted with unstable angina/non-STEMI last night.  He has positive cardiac risk factors.  He has no EKG changes.  He is currently on IV heparin.  Exam is benign.  He is scheduled for diagnostic coronary angiography later today.  Lorretta Harp, M.D., [place], [facility], [place] [address] [place], [place]  [address]  [phone] 10/18/2020 9:58 AM

## 2020-10-18 NOTE — Interval H&P Note (Signed)
Cath Lab Visit (complete for each Cath Lab visit)  Clinical Evaluation Leading to the Procedure:   ACS: Yes.    Non-ACS:  n/a   History and Physical Interval Note:  10/18/2020 2:39 PM  Justin Carroll  has presented today for surgery, with the diagnosis of nstemi.  The various methods of treatment have been discussed with the patient and family. After consideration of risks, benefits and other options for treatment, the patient has consented to  Procedure(s): LEFT HEART CATH AND CORONARY ANGIOGRAPHY (N/A) as a surgical intervention.  The patient's history has been reviewed, patient examined, no change in status, stable for surgery.  I have reviewed the patient's chart and labs.  Questions were answered to the patient's satisfaction.     Kathlyn [place]

## 2020-10-18 NOTE — Telephone Encounter (Signed)
Patient requested a refill on adderall. Pt is up to date on his appts. Pt is due for a refill on adderall. [place] Controlled Substance Registry checked and is appropriate.  Of note, pt is currently admitted with a NSTEMI.

## 2020-10-18 NOTE — Progress Notes (Signed)
Attempted on multiple occasions after 1730 to release air from TR band. Pt's site would start oozing with blood and air would have to be reapplied. No hematoma would present.

## 2020-10-18 NOTE — H&P (Signed)
Cardiology Admission History and Physical:   Patient ID: Justin Carroll MRN: [id number]; DOB: 08-Jul-1955   Admission date: 10/17/2020  PCP:  [facility], [place] [facility] Cardiologist:  None        Chief Complaint:  chest pain  Patient Profile:   Justin Carroll is a 65 y.o. male with adult ADHD, depression, sinusitis who is being seen 10/18/2020 for the evaluation of chest pain.  History of Present Illness:   Justin Carroll is a 66 year old male with past medical history as above who was in his usual state of health today until he developed chest pain during a client meeting.  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  He developed substernal chest pain that radiated across his chest and down his left arm with associated diaphoresis.  He wasn't particularly dyspneic but has felt fatigued for the alst couple of days prior to this episode.  Given the symptoms he presented to the [facility] ED where he was found to have mild troponin elevation, started on aspirin and heparin, and sent to our facility.  He is having no ongoing chest pain at this point.  ECG shows normal sinus rhythm with non-specific ST changes.  He stopped smoking 7 years ago with ~ 20 pack year history.  Past Medical History:  Diagnosis Date   Depression    Sinus infection    yearly    Past Surgical History:  Procedure Laterality Date   ADENOIDECTOMY     NASAL SEPTUM SURGERY     SINOSCOPY     TONSILLECTOMY     age 47     Medications Prior to Admission: Prior to Admission medications   Medication Sig Start Date End Date Taking? Authorizing Provider  amphetamine-dextroamphetamine (ADDERALL) 20 MG tablet Take 1 tablet in the morning and additional tablet if needed at lunch 09/14/20  Yes Lomax, Amy, NP  Azelastine HCl 137 MCG/SPRAY SOLN USE 2 SPRAY(S) IN EACH NOSTRIL TWICE DAILY AS NEEDED FOR RHINITIS Patient taking differently: Place 2 sprays into both nostrils 2  (two) times daily as needed (nasal congestion). 09/21/20  Yes Valentina Shaggy, MD  buPROPion (WELLBUTRIN XL) 300 MG 24 hr tablet Take 1 tablet by mouth once daily 08/21/20  Yes Gosrani, Nimish C, MD  Cholecalciferol (VITAMIN D-3) 25 MCG (1000 UT) CAPS Take 2 capsules by mouth daily.   Yes [provider]  levocetirizine (XYZAL) 5 MG tablet Take 5 mg by mouth every evening.   Yes [provider]  mometasone (NASONEX) 50 MCG/ACT nasal spray Place 1 spray into the nose daily. 12/08/19  Yes Valentina Shaggy, MD  pantoprazole (PROTONIX) 40 MG tablet Take 1 tablet by mouth once daily 08/21/20  Yes Gosrani, Nimish C, MD  tamsulosin (FLOMAX) 0.4 MG CAPS capsule TAKE 1 CAPSULE BY MOUTH ONCE DAILY AFTER SUPPER Patient taking differently: Take 0.4 mg by mouth daily after supper. 08/21/20  Yes Gosrani, Nimish C, MD  testosterone cypionate (DEPOTESTOSTERONE CYPIONATE) 200 MG/ML injection INJECT 0.5 MLS INTO THE MUSCLE TWICE A WEEK. 02/28/20  Yes Gosrani, Nimish C, MD  traZODone (DESYREL) 100 MG tablet TAKE 1 TABLET BY MOUTH AT BEDTIME 08/21/20  Yes Gosrani, Nimish C, MD  predniSONE (DELTASONE) 20 MG tablet Take 40 mg by mouth every morning. Patient not taking: No sig reported 09/21/20   [provider]     Allergies:   No Known Allergies  Social History:   Social  History   Socioeconomic History   Marital status: Married    Spouse name: Not on file   Number of children: Not on file   Years of education: Not on file   Highest education level: Not on file  Occupational History   Not on file  Tobacco Use   Smoking status: Former    Packs/day: 1.50    Years: 10.00    Pack years: 15.00    Types: Cigarettes    Start date: 63    Quit date: 2015    Years since quitting: 7.7   Smokeless tobacco: Never  Vaping Use   Vaping Use: Never used  Substance and Sexual Activity   Alcohol use: No   Drug use: No   Sexual activity: Not on file  Other Topics Concern   Not on  file  Social History Narrative   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   Social Determinants of Health   Financial Resource Strain: Not on file  Food Insecurity: Not on file  Transportation Needs: Not on file  Physical Activity: Not on file  Stress: Not on file  Social Connections: Not on file  Intimate Partner Violence: Not on file    Family History:   The patient's family history includes Allergic rhinitis in his brother. There is no history of Colon cancer, Pancreatic cancer, Rectal cancer, or Stomach cancer.    ROS:  Please see the history of present illness.  All other ROS reviewed and negative.     Physical Exam/Data:   Vitals:   10/17/20 2057 10/17/20 2145 10/17/20 2320 10/18/20 0446  BP: (!) 156/88 (!) 147/93 (!) 150/80 134/87  Pulse: 65 86  74  Resp: 16   18  Temp: 98.1 F (36.7 C) 98.3 F (36.8 C) 98.7 F (37.1 C) 98.6 F (37 C)  TempSrc:  Oral Oral Oral  SpO2: 98% 96% 96% 90%  Weight:      Height:        Intake/Output Summary (Last 24 hours) at 10/18/2020 0542 Last data filed at 10/18/2020 0515 Gross per 24 hour  Intake 120 ml  Output 750 ml  Net -630 ml   Last 3 Weights 10/17/2020 09/14/2020 06/15/2020  Weight (lbs) 187 lb 182 lb 182 lb 9.6 oz  Weight (kg) 84.823 kg 82.555 kg 82.827 kg     Body mass index is 29.29 kg/m.  General:  Well nourished, well developed, in no acute distress HEENT: normal Lymph: no adenopathy Neck: no JVD Endocrine:  No thryomegaly Vascular: No carotid bruits; FA pulses 2+ bilaterally without bruits  Cardiac:  normal S1, S2; RRR; no murmur  Lungs:  clear to auscultation bilaterally, no wheezing, rhonchi or rales  Abd: soft, nontender, no hepatomegaly  Ext: no edema Musculoskeletal:  No deformities, BUE and BLE strength normal and equal Skin: warm and dry  Neuro:  CNs 2-12 intact, no focal abnormalities noted Psych:  Normal affect    EKG:  The ECG that was done today was personally reviewed and demonstrates NSR with  non-specific St changes  Relevant CV Studies: None  Laboratory Data:  High Sensitivity Troponin:   Recent Labs  Lab 10/17/20 1621 10/17/20 1801  TROPONINIHS 64* 88*      Chemistry Recent Labs  Lab 10/17/20 1621 10/18/20 0016  NA 132* 134*  K 4.7 4.1  CL 100 102  CO2 24 24  GLUCOSE 227* 165*  BUN 19 18  CREATININE 1.25* 1.23  CALCIUM 8.9 8.4*  GFRNONAA >60 >60  ANIONGAP  8 8    Recent Labs  Lab 10/17/20 1621  PROT 7.3  ALBUMIN 4.1  AST 23  ALT 26  ALKPHOS 62  BILITOT 0.7   Hematology Recent Labs  Lab 10/17/20 1621 10/18/20 0016  WBC 8.0 10.0  RBC 6.40* 5.89*  HGB 18.0* 16.5  HCT 57.3* 51.2  MCV 89.5 86.9  MCH 28.1 28.0  MCHC 31.4 32.2  RDW 17.1* 15.5  PLT 255 253   BNPNo results for input(s): BNP, PROBNP in the last 168 hours.  DDimer No results for input(s): DDIMER in the last 168 hours.   Radiology/Studies:  DG Chest 2 View  Result Date: 10/17/2020 CLINICAL DATA:  Chest pain. EXAM: CHEST - 2 VIEW COMPARISON:  None. FINDINGS: The heart size and mediastinal contours are within normal limits. Both lungs are clear. The visualized skeletal structures are unremarkable. IMPRESSION: No active cardiopulmonary disease. Electronically Signed   By: Marijo Conception M.D.   On: 10/17/2020 16:55   CT Angio Chest/Abd/Pel for Dissection W and/or Wo Contrast  Result Date: 10/17/2020 CLINICAL DATA:  Abdominal pain, aortic dissection suspected. that radiates from left side of chest down left arm that began earlier toda EXAM: CT ANGIOGRAPHY CHEST, ABDOMEN AND PELVIS TECHNIQUE: Non-contrast CT of the chest was initially obtained. Multidetector CT imaging through the chest, abdomen and pelvis was performed using the standard protocol during bolus administration of intravenous contrast. Multiplanar reconstructed images and MIPs were obtained and reviewed to evaluate the vascular anatomy. CONTRAST:  71m OMNIPAQUE IOHEXOL 350 MG/ML SOLN COMPARISON:  None. FINDINGS: CTA CHEST  FINDINGS Cardiovascular: Preferential opacification of the thoracic aorta. No evidence of thoracic aortic aneurysm or dissection. Mild atherosclerotic plaque of the thoracic aorta. Normal heart size. No significant pericardial effusion.No coronary artery calcifications. The main pulmonary artery is normal in caliber with no central pulmonary embolus. Mediastinum/Nodes: No enlarged mediastinal, hilar, or axillary lymph nodes. Thyroid gland, trachea, and esophagus demonstrate no significant findings. Lungs/Pleura: Right upper lobe ground-glass airspace opacity measuring up to 0.6 cm (7:70). Subpleural left lower lobe 4 mm pulmonary micronodule (7:88). No focal consolidation. No pulmonary mass. No pleural effusion. No pneumothorax. Musculoskeletal: No chest wall abnormality. No suspicious lytic or blastic osseous lesions. No acute displaced fracture. Mild compression fracture of the T12 vertebral body with a prominent superior endplate Schmorl node. Large inferior endplate Schmorl noted the T9 vertebral body. Review of the MIP images confirms the above findings. CTA ABDOMEN AND PELVIS FINDINGS VASCULAR Aorta: Mild atherosclerotic plaque. Normal caliber aorta without aneurysm, dissection, vasculitis or significant stenosis. Poorly visualized narrowing of the origin of the left common carotid artery. Incidentally noted replaced left vertebral artery with origin off of the aortic arch just distal to the left common carotid artery origin. Celiac: Patent without evidence of aneurysm, dissection, vasculitis or significant stenosis. SMA: Patent without evidence of aneurysm, dissection, vasculitis or significant stenosis. Renals: Both renal arteries are patent without evidence of aneurysm, dissection, vasculitis, fibromuscular dysplasia or significant stenosis. IMA: Patent without evidence of aneurysm, dissection, vasculitis or significant stenosis. Inflow: Patent without evidence of aneurysm, dissection, vasculitis or  significant stenosis. Veins: No obvious venous abnormality within the limitations of this arterial phase study. Review of the MIP images confirms the above findings. NON-VASCULAR Hepatobiliary: Subcentimeter hypodensity too small to characterize (5:86). No focal liver abnormality. No gallstones, gallbladder wall thickening, or pericholecystic fluid. No biliary dilatation. Pancreas: No focal lesion. Normal pancreatic contour. No surrounding inflammatory changes. No main pancreatic ductal dilatation. Spleen: Normal in size without focal  abnormality. Adrenals/Urinary Tract: No adrenal nodule bilaterally. Bilateral kidneys enhance symmetrically. There is a 3.6 cm fluid density lesion within left kidney that likely represents a simple renal cyst. No hydronephrosis. No hydroureter. The urinary bladder is unremarkable. Stomach/Bowel: Stomach is within normal limits. No evidence of bowel wall thickening or dilatation. Scattered colonic diverticulosis. Appendix appears normal. Lymphatic: No lymphadenopathy. Reproductive: Prostate is enlarged measuring up to 5 cm. Other: No intraperitoneal free fluid. No intraperitoneal free gas. No organized fluid collection. Musculoskeletal: Tiny fat containing right inguinal hernia. No suspicious lytic or blastic osseous lesions. No acute displaced fracture. Multilevel degenerative changes of the spine. Age-indeterminate compression fracture of the L2 vertebral body with greater than 60% height loss. Review of the MIP images confirms the above findings. IMPRESSION: 1. No aortic dissection or aneurysm. Aortic Atherosclerosis (ICD10-I70.0) - mild. 2. Poorly visualized narrowing of the origin of the left common carotid artery. Versus artifact due to motion and streak artifact. 3. 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 4. A right upper lobe 6 mm ground-glass nodular airspace opacity. Initial follow-up with CT at 6-12 months is recommended to confirm  persistence. If persistent, repeat CT is recommended every 2 years until 5 years of stability has been established. This recommendation follows the consensus statement: Guidelines for Management of Incidental Pulmonary Nodules Detected on CT Images: From the Fleischner Society 2017; Radiology 2017; 284:228-243. 5. Prostatomegaly. 6. Age-indeterminate compression fracture of the T12 and L2 vertebral bodies. Electronically Signed   By: Iven Finn M.D.   On: 10/17/2020 18:32     Assessment and Plan:   65 year old male with history of smoking, ADD, depression presenting for chest pain.  He has positive biomarkers without significant ECG changes.  Given typical symptoms with abnormal biomarkers he rules in for MI and favor invasive strategy to which he is agreeable.  Problem list #Chest pain #Elevated troponin - ASA 81 mg daily - Atorvastatin 80 mg daily - Metoprolol sucicnate 25 mg daily - Heparin infusion - NPO for LHC - TTE  #Adult ADD - Continue home ampheatmines  #Depression - Continue home bupropion, trazodone PRN for sleep  #LUTS - Continue home tamsulolsin 0.4 mg daily  #Seasonal allergies - Azelastine, fluticasone, loratadine as at home.   Risk Assessment/Risk Scores:    TIMI Risk Score for Unstable Angina or Non-ST Elevation MI:   The patient's TIMI risk score is 3, which indicates a 13% risk of all cause mortality, new or recurrent myocardial infarction or need for urgent revascularization in the next 14 days.{  Severity of Illness: The appropriate patient status for this patient is OBSERVATION. Observation status is judged to be reasonable and necessary in order to provide the required intensity of service to ensure the patient's safety. The patient's presenting symptoms, physical exam findings, and initial radiographic and laboratory data in the context of their medical condition is felt to place them at decreased risk for further clinical deterioration. Furthermore,  it is anticipated that the patient will be medically stable for discharge from the hospital within 2 midnights of admission. The following factors support the patient status of observation.   " The patient's presenting symptoms include chest pain. " The physical exam findings include none. " The initial radiographic and laboratory data are abnormal troponin.   For questions or updates, please contact [employer] Please consult [email or web address] for contact info under     Signed, Delight Hoh, MD  10/18/2020 5:42 AM

## 2020-10-18 NOTE — H&P (View-Only) (Signed)
Progress Note  Patient Name: Justin Carroll Date of Encounter: 10/18/2020  [place] Cardiologist: None  Subjective   No chest pain overnight.   Inpatient Medications    Scheduled Meds:  amphetamine-dextroamphetamine  20 mg Oral Daily   aspirin EC  81 mg Oral Daily   atorvastatin  80 mg Oral Daily   buPROPion  300 mg Oral Daily   cholecalciferol  2,000 Units Oral Daily   fluticasone  1 spray Each Nare Daily   loratadine  10 mg Oral Daily   metoprolol succinate  25 mg Oral Daily   pantoprazole  40 mg Oral Daily   tamsulosin  0.4 mg Oral QPC supper   traZODone  100 mg Oral QHS   Continuous Infusions:  heparin 1,000 Units/hr (10/17/20 2004)   PRN Meds: acetaminophen, azelastine, nitroGLYCERIN, ondansetron (ZOFRAN) IV   Vital Signs    Vitals:   10/17/20 2145 10/17/20 2300 10/17/20 2320 10/18/20 0446  BP: (!) 147/93  (!) 150/80 134/87  Pulse: 86   74  Resp:    18  Temp: 98.3 F (36.8 C)  98.7 F (37.1 C) 98.6 F (37 C)  TempSrc: Oral  Oral Oral  SpO2: 96%  96% 90%  Weight:  82.1 kg    Height:  '5\' 7"'$  (1.702 m)      Intake/Output Summary (Last 24 hours) at 10/18/2020 0813 Last data filed at 10/18/2020 0515 Gross per 24 hour  Intake 120 ml  Output 750 ml  Net -630 ml   Last 3 Weights 10/17/2020 10/17/2020 09/14/2020  Weight (lbs) 181 lb 187 lb 182 lb  Weight (kg) 82.1 kg 84.823 kg 82.555 kg      Telemetry    NSR - Personally Reviewed  ECG    NSR 75bpm - Personally Reviewed  Physical Exam   GEN: No acute distress.   Neck: No JVD Cardiac: RRR, no murmurs, rubs, or gallops.  Respiratory: Clear to auscultation bilaterally. GI: Soft, nontender, non-distended  MS: No edema; No deformity. Neuro:  Nonfocal  Psych: Normal affect   Labs    High Sensitivity Troponin:   Recent Labs  Lab 10/17/20 1621 10/17/20 1801 10/18/20 0546  TROPONINIHS 64* 88* 3,791*      Chemistry Recent Labs  Lab 10/17/20 1621 10/18/20 0016  NA 132* 134*  K 4.7 4.1   CL 100 102  CO2 24 24  GLUCOSE 227* 165*  BUN 19 18  CREATININE 1.25* 1.23  CALCIUM 8.9 8.4*  PROT 7.3  --   ALBUMIN 4.1  --   AST 23  --   ALT 26  --   ALKPHOS 62  --   BILITOT 0.7  --   GFRNONAA >60 >60  ANIONGAP 8 8     Hematology Recent Labs  Lab 10/17/20 1621 10/18/20 0016  WBC 8.0 10.0  RBC 6.40* 5.89*  HGB 18.0* 16.5  HCT 57.3* 51.2  MCV 89.5 86.9  MCH 28.1 28.0  MCHC 31.4 32.2  RDW 17.1* 15.5  PLT 255 253    BNPNo results for input(s): BNP, PROBNP in the last 168 hours.   DDimer No results for input(s): DDIMER in the last 168 hours.   Radiology    DG Chest 2 View  Result Date: 10/17/2020 CLINICAL DATA:  Chest pain. EXAM: CHEST - 2 VIEW COMPARISON:  None. FINDINGS: The heart size and mediastinal contours are within normal limits. Both lungs are clear. The visualized skeletal structures are unremarkable. IMPRESSION: No active cardiopulmonary disease. Electronically Signed  By: Marijo Conception M.D.   On: 10/17/2020 16:55   CT Angio Chest/Abd/Pel for Dissection W and/or Wo Contrast  Result Date: 10/17/2020 CLINICAL DATA:  Abdominal pain, aortic dissection suspected. that radiates from left side of chest down left arm that began earlier toda EXAM: CT ANGIOGRAPHY CHEST, ABDOMEN AND PELVIS TECHNIQUE: Non-contrast CT of the chest was initially obtained. Multidetector CT imaging through the chest, abdomen and pelvis was performed using the standard protocol during bolus administration of intravenous contrast. Multiplanar reconstructed images and MIPs were obtained and reviewed to evaluate the vascular anatomy. CONTRAST:  60m OMNIPAQUE IOHEXOL 350 MG/ML SOLN COMPARISON:  None. FINDINGS: CTA CHEST FINDINGS Cardiovascular: Preferential opacification of the thoracic aorta. No evidence of thoracic aortic aneurysm or dissection. Mild atherosclerotic plaque of the thoracic aorta. Normal heart size. No significant pericardial effusion.No coronary artery calcifications. The main  pulmonary artery is normal in caliber with no central pulmonary embolus. Mediastinum/Nodes: No enlarged mediastinal, hilar, or axillary lymph nodes. Thyroid gland, trachea, and esophagus demonstrate no significant findings. Lungs/Pleura: Right upper lobe ground-glass airspace opacity measuring up to 0.6 cm (7:70). Subpleural left lower lobe 4 mm pulmonary micronodule (7:88). No focal consolidation. No pulmonary mass. No pleural effusion. No pneumothorax. Musculoskeletal: No chest wall abnormality. No suspicious lytic or blastic osseous lesions. No acute displaced fracture. Mild compression fracture of the T12 vertebral body with a prominent superior endplate Schmorl node. Large inferior endplate Schmorl noted the T9 vertebral body. Review of the MIP images confirms the above findings. CTA ABDOMEN AND PELVIS FINDINGS VASCULAR Aorta: Mild atherosclerotic plaque. Normal caliber aorta without aneurysm, dissection, vasculitis or significant stenosis. Poorly visualized narrowing of the origin of the left common carotid artery. Incidentally noted replaced left vertebral artery with origin off of the aortic arch just distal to the left common carotid artery origin. Celiac: Patent without evidence of aneurysm, dissection, vasculitis or significant stenosis. SMA: Patent without evidence of aneurysm, dissection, vasculitis or significant stenosis. Renals: Both renal arteries are patent without evidence of aneurysm, dissection, vasculitis, fibromuscular dysplasia or significant stenosis. IMA: Patent without evidence of aneurysm, dissection, vasculitis or significant stenosis. Inflow: Patent without evidence of aneurysm, dissection, vasculitis or significant stenosis. Veins: No obvious venous abnormality within the limitations of this arterial phase study. Review of the MIP images confirms the above findings. NON-VASCULAR Hepatobiliary: Subcentimeter hypodensity too small to characterize (5:86). No focal liver abnormality. No  gallstones, gallbladder wall thickening, or pericholecystic fluid. No biliary dilatation. Pancreas: No focal lesion. Normal pancreatic contour. No surrounding inflammatory changes. No main pancreatic ductal dilatation. Spleen: Normal in size without focal abnormality. Adrenals/Urinary Tract: No adrenal nodule bilaterally. Bilateral kidneys enhance symmetrically. There is a 3.6 cm fluid density lesion within left kidney that likely represents a simple renal cyst. No hydronephrosis. No hydroureter. The urinary bladder is unremarkable. Stomach/Bowel: Stomach is within normal limits. No evidence of bowel wall thickening or dilatation. Scattered colonic diverticulosis. Appendix appears normal. Lymphatic: No lymphadenopathy. Reproductive: Prostate is enlarged measuring up to 5 cm. Other: No intraperitoneal free fluid. No intraperitoneal free gas. No organized fluid collection. Musculoskeletal: Tiny fat containing right inguinal hernia. No suspicious lytic or blastic osseous lesions. No acute displaced fracture. Multilevel degenerative changes of the spine. Age-indeterminate compression fracture of the L2 vertebral body with greater than 60% height loss. Review of the MIP images confirms the above findings. IMPRESSION: 1. No aortic dissection or aneurysm. Aortic Atherosclerosis (ICD10-I70.0) - mild. 2. Poorly visualized narrowing of the origin of the left common carotid artery. Versus  artifact due to motion and streak artifact. 3. 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 4. A right upper lobe 6 mm ground-glass nodular airspace opacity. Initial follow-up with CT at 6-12 months is recommended to confirm persistence. If persistent, repeat CT is recommended every 2 years until 5 years of stability has been established. This recommendation follows the consensus statement: Guidelines for Management of Incidental Pulmonary Nodules Detected on CT Images: From the Fleischner Society 2017;  Radiology 2017; 284:228-243. 5. Prostatomegaly. 6. Age-indeterminate compression fracture of the T12 and L2 vertebral bodies. Electronically Signed   By: Iven Finn M.D.   On: 10/17/2020 18:32    Cardiac Studies   Echo: pending  Patient Profile     65 y.o. male  with adult ADHD, depression, sinusitis who is being seen 10/18/2020 for the evaluation of chest pain.  Assessment & Plan    NSTEMI: hsTn peaking at 3791. Remains on IV heparin. Reviewed plan for cardiac cath with patient. No chest pain overnight.  -- The patient understands that risks included but are not limited to stroke (1 in 1000), death (1 in 79), kidney failure [usually temporary] (1 in 500), bleeding (1 in 200), allergic reaction [possibly serious] (1 in 200).   -- continue on IV heparin, ASA, statin, and BB -- echo pending  HLD: LDL 106 -- started on high dose statin  DM: Hgb A1c 6.3 -- SSI while inpatient  ADHD: on adderall PTA, will hold now with concerns for ACS  Depression: on Wellbutrin  For questions or updates, please contact [place] Please consult [email or web address] for contact info under        Signed, Reino Bellis, NP  10/18/2020, 8:13 AM     Agree with note by Reino Bellis NP-C  [name] was admitted with unstable angina/non-STEMI last night.  He has positive cardiac risk factors.  He has no EKG changes.  He is currently on IV heparin.  Exam is benign.  He is scheduled for diagnostic coronary angiography later today.  Lorretta Harp, M.D., [place], [facility], [place] [address] [place], [place]  [address]  [phone] 10/18/2020 9:58 AM

## 2020-10-18 NOTE — Plan of Care (Signed)
  Problem: Education: Goal: Knowledge of General Education information will improve Description: Including pain rating scale, medication(s)/side effects and non-pharmacologic comfort measures Outcome: Progressing   Problem: Clinical Measurements: Goal: Ability to maintain clinical measurements within normal limits will improve Outcome: Progressing   Problem: Clinical Measurements: Goal: Respiratory complications will improve Outcome: Progressing   Problem: Clinical Measurements: Goal: Cardiovascular complication will be avoided Outcome: Progressing   Problem: Clinical Measurements: Goal: Diagnostic test results will improve Outcome: Progressing   Problem: Pain Managment: Goal: General experience of comfort will improve Outcome: Progressing   Problem: Safety: Goal: Ability to remain free from injury will improve Outcome: Progressing   Problem: Cardiac: Goal: Ability to achieve and maintain adequate cardiopulmonary perfusion will improve Outcome: Progressing

## 2020-10-18 NOTE — Progress Notes (Signed)
[place] for Heparin Indication: chest pain/ACS  No Known Allergies  Patient Measurements: Height: '5\' 7"'$  (170.2 cm) Weight: 82.1 kg (181 lb) IBW/kg (Calculated) : 66.1 Heparin Dosing Weight: 83 kg  Vital Signs: Temp: 98.4 F (36.9 C) (09/14 0813) Temp Source: Oral (09/14 0813) BP: 133/86 (09/14 0830) Pulse Rate: 77 (09/14 0813)  Labs: Recent Labs    10/17/20 1621 10/17/20 1801 10/18/20 0016 10/18/20 0233 10/18/20 0546 10/18/20 0748  HGB 18.0*  --  16.5  --   --   --   HCT 57.3*  --  51.2  --   --   --   PLT 255  --  253  --   --   --   HEPARINUNFRC  --   --   --  0.36  --  0.23*  CREATININE 1.25*  --  1.23  --   --   --   TROPONINIHS 64* 88*  --   --  3,791* 3,200*     Estimated Creatinine Clearance: 60.6 mL/min (by C-G formula based on SCr of 1.23 mg/dL).   Medical History: Past Medical History:  Diagnosis Date   Depression    Sinus infection    yearly    Medications:  Not on Anticoagulation prior to admit.  Assessment: 65 y.o male presented to ED with onset of chest pain.  Pharmacy consulted to dose IV heparin infusion for ACS.    Heparin is subtherapeutic at 0.23 on 1000 U/hr. CBC stable. Plans noted for cath today.  Goal of Therapy:  Heparin level 0.3-0.7 units/ml Monitor platelets by anticoagulation protocol: Yes   Plan:   Increase heparin to 1200 U/hr.  Follow plans post-cath.  Cyd Silence Pharm D. Candidate

## 2020-10-18 NOTE — Progress Notes (Signed)
ANTICOAGULATION CONSULT NOTE   Pharmacy Consult for Heparin Indication: chest pain/ACS  No Known Allergies  Patient Measurements: Height: '5\' 7"'$  (170.2 cm) Weight: 84.8 kg (187 lb) IBW/kg (Calculated) : 66.1 Heparin Dosing Weight: 83 kg  Vital Signs: Temp: 98.7 F (37.1 C) (09/13 2320) Temp Source: Oral (09/13 2320) BP: 150/80 (09/13 2320) Pulse Rate: 86 (09/13 2145)  Labs: Recent Labs    10/17/20 1621 10/17/20 1801 10/18/20 0016 10/18/20 0233  HGB 18.0*  --  16.5  --   HCT 57.3*  --  51.2  --   PLT 255  --  253  --   HEPARINUNFRC  --   --   --  0.36  CREATININE 1.25*  --  1.23  --   TROPONINIHS 64* 88*  --   --      Estimated Creatinine Clearance: 61.5 mL/min (by C-G formula based on SCr of 1.23 mg/dL).   Medical History: Past Medical History:  Diagnosis Date   Depression    Sinus infection    yearly    Medications:  Not on Anticoagulation prior to admit.  Assessment: 65 y.o male presented to ED with onset of chest pain.  Pharmacy consulted to start IV heparin infusion for ACS.   Initial heparin level at goal on 1000 units/hr. No bleeding issues noted.  Goal of Therapy:  Heparin level 0.3-0.7 units/ml Monitor platelets by anticoagulation protocol: Yes   Plan:  Continue heparin at 1000 units/hr Check confirmatory level in 6 hours  Erin Hearing PharmD., BCPS Clinical Pharmacist 10/18/2020 3:10 AM

## 2020-10-19 ENCOUNTER — Encounter (HOSPITAL_COMMUNITY): Payer: Self-pay | Admitting: Cardiovascular Disease

## 2020-10-19 ENCOUNTER — Other Ambulatory Visit: Payer: Self-pay

## 2020-10-19 ENCOUNTER — Ambulatory Visit (INDEPENDENT_AMBULATORY_CARE_PROVIDER_SITE_OTHER): Payer: Medicare Other | Admitting: Internal Medicine

## 2020-10-19 DIAGNOSIS — I255 Ischemic cardiomyopathy: Secondary | ICD-10-CM

## 2020-10-19 LAB — CBC
HCT: 53.4 % — ABNORMAL HIGH (ref 39.0–52.0)
Hemoglobin: 16.9 g/dL (ref 13.0–17.0)
MCH: 28.2 pg (ref 26.0–34.0)
MCHC: 31.6 g/dL (ref 30.0–36.0)
MCV: 89.1 fL (ref 80.0–100.0)
Platelets: 205 10*3/uL (ref 150–400)
RBC: 5.99 MIL/uL — ABNORMAL HIGH (ref 4.22–5.81)
RDW: 16.1 % — ABNORMAL HIGH (ref 11.5–15.5)
WBC: 7.3 10*3/uL (ref 4.0–10.5)
nRBC: 0 % (ref 0.0–0.2)

## 2020-10-19 LAB — BASIC METABOLIC PANEL WITH GFR
Anion gap: 7 (ref 5–15)
BUN: 18 mg/dL (ref 8–23)
CO2: 27 mmol/L (ref 22–32)
Calcium: 8.2 mg/dL — ABNORMAL LOW (ref 8.9–10.3)
Chloride: 104 mmol/L (ref 98–111)
Creatinine, Ser: 1.47 mg/dL — ABNORMAL HIGH (ref 0.61–1.24)
GFR, Estimated: 52 mL/min — ABNORMAL LOW
Glucose, Bld: 88 mg/dL (ref 70–99)
Potassium: 4.2 mmol/L (ref 3.5–5.1)
Sodium: 138 mmol/L (ref 135–145)

## 2020-10-19 LAB — GLUCOSE, CAPILLARY
Glucose-Capillary: 98 mg/dL (ref 70–99)
Glucose-Capillary: 124 mg/dL — ABNORMAL HIGH (ref 70–99)
Glucose-Capillary: 84 mg/dL (ref 70–99)
Glucose-Capillary: 104 mg/dL — ABNORMAL HIGH (ref 70–99)

## 2020-10-19 MED ORDER — CARVEDILOL 3.125 MG PO TABS
3.1250 mg | ORAL_TABLET | Freq: Two times a day (BID) | ORAL | Status: DC
  Administered 2020-10-19 – 2020-10-20 (×2): 3.125 mg via ORAL
  Filled 2020-10-19 (×2): qty 1

## 2020-10-19 MED ORDER — SACUBITRIL-VALSARTAN 24-26 MG PO TABS
1.0000 | ORAL_TABLET | Freq: Two times a day (BID) | ORAL | Status: DC
  Administered 2020-10-19 (×2): 1 via ORAL
  Filled 2020-10-19 (×2): qty 1

## 2020-10-19 MED ORDER — DAPAGLIFLOZIN PROPANEDIOL 10 MG PO TABS
10.0000 mg | ORAL_TABLET | Freq: Every day | ORAL | Status: DC
  Administered 2020-10-19 – 2020-10-20 (×2): 10 mg via ORAL
  Filled 2020-10-19 (×2): qty 1

## 2020-10-19 NOTE — Progress Notes (Signed)
CARDIAC REHAB PHASE I   PRE:  Rate/Rhythm: 61 SR  BP:  Supine:   Sitting: 122/80  Standing:    SaO2: 96%RA  MODE:  Ambulation: 540 ft   POST:  Rate/Rhythm: 78 SR  BP:  Supine:   Sitting: 127/74  Standing:    SaO2: 95%RA [id number] Waited for pt to finish breakfast and wife to arrive before walking and ed. Pt walked 540 ft with steady gait and no CP. MI education completed with pt and wife with understanding voiced. Discussed MI restrictions, NTG use, walking for ex, heart healthy and low carb food choices and 2000 mg sodium restriction. Discussed signs/symptoms of CHF and importance of daily weights. Discussed CRP 2 and referred to [place] as he works near [employer].  Will follow up tomorrow for questions and to walk.   Graylon Good, RN BSN  10/19/2020 9:48 AM

## 2020-10-19 NOTE — Progress Notes (Signed)
Progress Note  Patient Name: Justin Carroll Date of Encounter: 10/19/2020  [place] Cardiologist: None  Subjective   No chest pain overnight.  Postop day 1 and circumflex intervention by Dr. Fletcher Anon .  Inpatient Medications    Scheduled Meds:  aspirin EC  81 mg Oral Daily   atorvastatin  80 mg Oral Daily   buPROPion  300 mg Oral Daily   carvedilol  3.125 mg Oral BID WC   cholecalciferol  2,000 Units Oral Daily   dapagliflozin propanediol  10 mg Oral Daily   enoxaparin (LOVENOX) injection  40 mg Subcutaneous Q24H   fluticasone  1 spray Each Nare Daily   insulin aspart  0-5 Units Subcutaneous QHS   insulin aspart  0-9 Units Subcutaneous TID WC   loratadine  10 mg Oral Daily   pantoprazole  40 mg Oral Daily   sacubitril-valsartan  1 tablet Oral BID   sodium chloride flush  3 mL Intravenous Q12H   sodium chloride flush  3 mL Intravenous Q12H   tamsulosin  0.4 mg Oral QPC supper   ticagrelor  90 mg Oral BID   traZODone  100 mg Oral QHS   Continuous Infusions:  sodium chloride     PRN Meds: sodium chloride, acetaminophen, azelastine, naphazoline-glycerin, nitroGLYCERIN, ondansetron (ZOFRAN) IV, sodium chloride flush   Vital Signs    Vitals:   10/18/20 1848 10/18/20 1903 10/19/20 0541 10/19/20 0843  BP: 138/82 127/79 (!) 124/94 122/80  Pulse:   61 69  Resp:   19   Temp:   99.1 F (37.3 C)   TempSrc:   Oral   SpO2:  93% 96%   Weight:      Height:        Intake/Output Summary (Last 24 hours) at 10/19/2020 0920 Last data filed at 10/19/2020 0540 Gross per 24 hour  Intake 1362.76 ml  Output 200 ml  Net 1162.76 ml   Last 3 Weights 10/18/2020 10/17/2020 10/17/2020  Weight (lbs) 179 lb 9.6 oz 181 lb 187 lb  Weight (kg) 81.466 kg 82.1 kg 84.823 kg      Telemetry    NSR with short runs of nonsustained ventricular tachycardia.- Personally Reviewed  ECG    NSR at 65 without ST or T wave changes.- Personally Reviewed  Physical Exam   GEN: No acute distress.    Neck: No JVD Cardiac: RRR, no murmurs, rubs, or gallops.  Respiratory: Clear to auscultation bilaterally. GI: Soft, nontender, non-distended  MS: No edema; No deformity. Neuro:  Nonfocal  Psych: Normal affect   Labs    High Sensitivity Troponin:   Recent Labs  Lab 10/17/20 1621 10/17/20 1801 10/18/20 0546 10/18/20 0748  TROPONINIHS 64* 88* 3,791* 3,200*      Chemistry Recent Labs  Lab 10/17/20 1621 10/18/20 0016 10/19/20 0351  NA 132* 134* 138  K 4.7 4.1 4.2  CL 100 102 104  CO2 '24 24 27  '$ GLUCOSE 227* 165* 88  BUN '19 18 18  '$ CREATININE 1.25* 1.23 1.47*  CALCIUM 8.9 8.4* 8.2*  PROT 7.3  --   --   ALBUMIN 4.1  --   --   AST 23  --   --   ALT 26  --   --   ALKPHOS 62  --   --   BILITOT 0.7  --   --   GFRNONAA >60 >60 52*  ANIONGAP '8 8 7     '$ Hematology Recent Labs  Lab 10/17/20 1621 10/18/20 0016 10/19/20  0351  WBC 8.0 10.0 7.3  RBC 6.40* 5.89* 5.99*  HGB 18.0* 16.5 16.9  HCT 57.3* 51.2 53.4*  MCV 89.5 86.9 89.1  MCH 28.1 28.0 28.2  MCHC 31.4 32.2 31.6  RDW 17.1* 15.5 16.1*  PLT 255 253 205    BNPNo results for input(s): BNP, PROBNP in the last 168 hours.   DDimer No results for input(s): DDIMER in the last 168 hours.   Radiology    DG Chest 2 View  Result Date: 10/17/2020 CLINICAL DATA:  Chest pain. EXAM: CHEST - 2 VIEW COMPARISON:  None. FINDINGS: The heart size and mediastinal contours are within normal limits. Both lungs are clear. The visualized skeletal structures are unremarkable. IMPRESSION: No active cardiopulmonary disease. Electronically Signed   By: Marijo Conception M.D.   On: 10/17/2020 16:55   CARDIAC CATHETERIZATION  Result Date: 10/19/2020   Prox RCA lesion is 20% stenosed.   Prox Cx to Mid Cx lesion is 85% stenosed.   A drug-eluting stent was successfully placed using a STENT ONYX FRONTIER 3.5X15.   Post intervention, there is a 0% residual stenosis.   The left ventricular systolic function is normal.   LV end diastolic pressure is  normal.   The left ventricular ejection fraction is 55-65% by visual estimate. 1.  Severe one-vessel coronary artery disease involving the mid left circumflex which is likely culprit for non-STEMI. 2. Successful OCT guided DES placement to mid LCX. Recommendations: Dual antiplatelet therapy for at least 12 months. Aggressive treatment of risk factors. Likely discharge home tomorrow.   ECHOCARDIOGRAM COMPLETE  Result Date: 10/18/2020    ECHOCARDIOGRAM REPORT   Patient Name:   Justin Carroll Date of Exam: 10/18/2020 Medical Rec #:  [id number]    Height:       67.0 in Accession #:    [id number]   Weight:       179.6 lb Date of Birth:  03-Aug-1955    BSA:          1.932 m Patient Age:    65 years     BP:           106/78 mmHg Patient Gender: M            HR:           62 bpm. Exam Location:  Inpatient Procedure: 2D Echo, Cardiac Doppler, Color Doppler and Intracardiac            Opacification Agent Indications:    Chest Pain R07.9  History:        Patient has no prior history of Echocardiogram examinations. CAD                 and Acute MI, Cardiac cath today; Signs/Symptoms:Chest Pain.  Sonographer:    Merrie Roof RDCS Referring Phys: [id number] [place]  1. Left ventricular ejection fraction, by estimation, is 35 to 40%. The left ventricle has moderately decreased function. The left ventricle demonstrates global hypokinesis. There is mild concentric left ventricular hypertrophy. Left ventricular diastolic parameters are consistent with Grade I diastolic dysfunction (impaired relaxation).  2. Right ventricular systolic function is normal. The right ventricular size is normal.  3. Right atrial size was mildly dilated.  4. The mitral valve is normal in structure. Mild mitral valve regurgitation. No evidence of mitral stenosis.  5. The aortic valve is tricuspid. Aortic valve regurgitation is not visualized. No aortic stenosis is present.  6. Aortic dilatation noted. There is mild dilatation of  the aortic  root, measuring 40 mm. There is borderline dilatation of the ascending aorta, measuring 36 mm.  7. The inferior vena cava is normal in size with greater than 50% respiratory variability, suggesting right atrial pressure of 3 mmHg. FINDINGS  Left Ventricle: Left ventricular ejection fraction, by estimation, is 35 to 40%. The left ventricle has moderately decreased function. The left ventricle demonstrates global hypokinesis. The left ventricular internal cavity size was normal in size. There is mild concentric left ventricular hypertrophy. Left ventricular diastolic parameters are consistent with Grade I diastolic dysfunction (impaired relaxation). Normal left ventricular filling pressure. Right Ventricle: The right ventricular size is normal. No increase in right ventricular wall thickness. Right ventricular systolic function is normal. Left Atrium: Left atrial size was normal in size. Right Atrium: Right atrial size was mildly dilated. Pericardium: There is no evidence of pericardial effusion. Mitral Valve: The mitral valve is normal in structure. Mild mitral valve regurgitation. No evidence of mitral valve stenosis. Tricuspid Valve: The tricuspid valve is normal in structure. Tricuspid valve regurgitation is not demonstrated. No evidence of tricuspid stenosis. Aortic Valve: The aortic valve is tricuspid. Aortic valve regurgitation is not visualized. No aortic stenosis is present. Aortic valve mean gradient measures 2.0 mmHg. Aortic valve peak gradient measures 4.5 mmHg. Aortic valve area, by VTI measures 2.59 cm. Pulmonic Valve: The pulmonic valve was normal in structure. Pulmonic valve regurgitation is not visualized. No evidence of pulmonic stenosis. Aorta: Aortic dilatation noted. There is mild dilatation of the aortic root, measuring 40 mm. There is borderline dilatation of the ascending aorta, measuring 36 mm. Venous: The inferior vena cava is normal in size with greater than 50% respiratory variability,  suggesting right atrial pressure of 3 mmHg. IAS/Shunts: No atrial level shunt detected by color flow Doppler.  LEFT VENTRICLE PLAX 2D LVIDd:         4.60 cm      Diastology LVIDs:         3.30 cm      LV e' medial:    8.05 cm/s LV PW:         1.20 cm      LV E/e' medial:  6.8 LV IVS:        1.10 cm      LV e' lateral:   8.59 cm/s LVOT diam:     2.20 cm      LV E/e' lateral: 6.4 LV SV:         50 LV SV Index:   26 LVOT Area:     3.80 cm  LV Volumes (MOD) LV vol d, MOD A2C: 107.0 ml LV vol d, MOD A4C: 150.0 ml LV vol s, MOD A2C: 57.9 ml LV vol s, MOD A4C: 86.8 ml LV SV MOD A2C:     49.1 ml LV SV MOD A4C:     150.0 ml LV SV MOD BP:      51.8 ml RIGHT VENTRICLE RV Basal diam:  3.60 cm RV S prime:     7.58 cm/s TAPSE (M-mode): 1.8 cm LEFT ATRIUM           Index       RIGHT ATRIUM           Index LA diam:      3.90 cm 2.02 cm/m  RA Area:     19.40 cm LA Vol (A4C): 33.4 ml 17.29 ml/m RA Volume:   62.60 ml  32.40 ml/m  AORTIC VALVE AV Area (Vmax):  2.57 cm AV Area (Vmean):   2.39 cm AV Area (VTI):     2.59 cm AV Vmax:           106.00 cm/s AV Vmean:          68.700 cm/s AV VTI:            0.192 m AV Peak Grad:      4.5 mmHg AV Mean Grad:      2.0 mmHg LVOT Vmax:         71.70 cm/s LVOT Vmean:        43.200 cm/s LVOT VTI:          0.131 m LVOT/AV VTI ratio: 0.68  AORTA Ao Root diam: 3.95 cm Ao Asc diam:  3.60 cm MITRAL VALVE MV Area (PHT): 4.31 cm    SHUNTS MV Decel Time: 176 msec    Systemic VTI:  0.13 m MV E velocity: 55.10 cm/s  Systemic Diam: 2.20 cm MV A velocity: 68.00 cm/s MV E/A ratio:  0.81 Skeet Latch MD Electronically signed by Skeet Latch MD Signature Date/Time: 10/18/2020/11:33:02 PM    Final    CT Angio Chest/Abd/Pel for Dissection W and/or Wo Contrast  Result Date: 10/17/2020 CLINICAL DATA:  Abdominal pain, aortic dissection suspected. that radiates from left side of chest down left arm that began earlier toda EXAM: CT ANGIOGRAPHY CHEST, ABDOMEN AND PELVIS TECHNIQUE: Non-contrast CT of  the chest was initially obtained. Multidetector CT imaging through the chest, abdomen and pelvis was performed using the standard protocol during bolus administration of intravenous contrast. Multiplanar reconstructed images and MIPs were obtained and reviewed to evaluate the vascular anatomy. CONTRAST:  34m OMNIPAQUE IOHEXOL 350 MG/ML SOLN COMPARISON:  None. FINDINGS: CTA CHEST FINDINGS Cardiovascular: Preferential opacification of the thoracic aorta. No evidence of thoracic aortic aneurysm or dissection. Mild atherosclerotic plaque of the thoracic aorta. Normal heart size. No significant pericardial effusion.No coronary artery calcifications. The main pulmonary artery is normal in caliber with no central pulmonary embolus. Mediastinum/Nodes: No enlarged mediastinal, hilar, or axillary lymph nodes. Thyroid gland, trachea, and esophagus demonstrate no significant findings. Lungs/Pleura: Right upper lobe ground-glass airspace opacity measuring up to 0.6 cm (7:70). Subpleural left lower lobe 4 mm pulmonary micronodule (7:88). No focal consolidation. No pulmonary mass. No pleural effusion. No pneumothorax. Musculoskeletal: No chest wall abnormality. No suspicious lytic or blastic osseous lesions. No acute displaced fracture. Mild compression fracture of the T12 vertebral body with a prominent superior endplate Schmorl node. Large inferior endplate Schmorl noted the T9 vertebral body. Review of the MIP images confirms the above findings. CTA ABDOMEN AND PELVIS FINDINGS VASCULAR Aorta: Mild atherosclerotic plaque. Normal caliber aorta without aneurysm, dissection, vasculitis or significant stenosis. Poorly visualized narrowing of the origin of the left common carotid artery. Incidentally noted replaced left vertebral artery with origin off of the aortic arch just distal to the left common carotid artery origin. Celiac: Patent without evidence of aneurysm, dissection, vasculitis or significant stenosis. SMA: Patent  without evidence of aneurysm, dissection, vasculitis or significant stenosis. Renals: Both renal arteries are patent without evidence of aneurysm, dissection, vasculitis, fibromuscular dysplasia or significant stenosis. IMA: Patent without evidence of aneurysm, dissection, vasculitis or significant stenosis. Inflow: Patent without evidence of aneurysm, dissection, vasculitis or significant stenosis. Veins: No obvious venous abnormality within the limitations of this arterial phase study. Review of the MIP images confirms the above findings. NON-VASCULAR Hepatobiliary: Subcentimeter hypodensity too small to characterize (5:86). No focal liver abnormality. No gallstones, gallbladder wall thickening,  or pericholecystic fluid. No biliary dilatation. Pancreas: No focal lesion. Normal pancreatic contour. No surrounding inflammatory changes. No main pancreatic ductal dilatation. Spleen: Normal in size without focal abnormality. Adrenals/Urinary Tract: No adrenal nodule bilaterally. Bilateral kidneys enhance symmetrically. There is a 3.6 cm fluid density lesion within left kidney that likely represents a simple renal cyst. No hydronephrosis. No hydroureter. The urinary bladder is unremarkable. Stomach/Bowel: Stomach is within normal limits. No evidence of bowel wall thickening or dilatation. Scattered colonic diverticulosis. Appendix appears normal. Lymphatic: No lymphadenopathy. Reproductive: Prostate is enlarged measuring up to 5 cm. Other: No intraperitoneal free fluid. No intraperitoneal free gas. No organized fluid collection. Musculoskeletal: Tiny fat containing right inguinal hernia. No suspicious lytic or blastic osseous lesions. No acute displaced fracture. Multilevel degenerative changes of the spine. Age-indeterminate compression fracture of the L2 vertebral body with greater than 60% height loss. Review of the MIP images confirms the above findings. IMPRESSION: 1. No aortic dissection or aneurysm. Aortic  Atherosclerosis (ICD10-I70.0) - mild. 2. Poorly visualized narrowing of the origin of the left common carotid artery. Versus artifact due to motion and streak artifact. 3. 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 4. A right upper lobe 6 mm ground-glass nodular airspace opacity. Initial follow-up with CT at 6-12 months is recommended to confirm persistence. If persistent, repeat CT is recommended every 2 years until 5 years of stability has been established. This recommendation follows the consensus statement: Guidelines for Management of Incidental Pulmonary Nodules Detected on CT Images: From the Fleischner Society 2017; Radiology 2017; 284:228-243. 5. Prostatomegaly. 6. Age-indeterminate compression fracture of the T12 and L2 vertebral bodies. Electronically Signed   By: Iven Finn M.D.   On: 10/17/2020 18:32    Cardiac Studies   2D echocardiogram (10/18/2020)  IMPRESSIONS     1. Left ventricular ejection fraction, by estimation, is 35 to 40%. The  left ventricle has moderately decreased function. The left ventricle  demonstrates global hypokinesis. There is mild concentric left ventricular  hypertrophy. Left ventricular  diastolic parameters are consistent with Grade I diastolic dysfunction  (impaired relaxation).   2. Right ventricular systolic function is normal. The right ventricular  size is normal.   3. Right atrial size was mildly dilated.   4. The mitral valve is normal in structure. Mild mitral valve  regurgitation. No evidence of mitral stenosis.   5. The aortic valve is tricuspid. Aortic valve regurgitation is not  visualized. No aortic stenosis is present.   6. Aortic dilatation noted. There is mild dilatation of the aortic root,  measuring 40 mm. There is borderline dilatation of the ascending aorta,  measuring 36 mm.   7. The inferior vena cava is normal in size with greater than 50%  respiratory variability, suggesting right atrial  pressure of 3 mmHg.    Cardiac catheterization/PCI drug-eluting stent (10/18/2020)  Conclusion      Prox RCA lesion is 20% stenosed.   Prox Cx to Mid Cx lesion is 85% stenosed.   A drug-eluting stent was successfully placed using a STENT ONYX FRONTIER 3.5X15.   Post intervention, there is a 0% residual stenosis.   The left ventricular systolic function is normal.   LV end diastolic pressure is normal.   The left ventricular ejection fraction is 55-65% by visual estimate.   1.  Severe one-vessel coronary artery disease involving the mid left circumflex which is likely culprit for non-STEMI. 2. Successful OCT guided DES placement to mid LCX.  Recommendations:  Dual antiplatelet therapy for at least 12 months. Aggressive treatment of risk factors. Likely discharge home tomorrow. Diagrams  Diagnostic Dominance: Right Intervention     Patient Profile     65 y.o. male  with adult ADHD, depression, sinusitis who is being seen 10/18/2020 for the evaluation of chest pain.  He had a non-STEMI.  His troponins went up to the 3000 range.  His cath revealed a focal mid AV groove circumflex stenosis which underwent OCT guided PCI drug-eluting stenting by Dr. Fletcher Anon  yesterday which was uneventful.  2D echo revealed an EF of 35 to 40%.  Assessment & Plan    NSTEMI: hsTn peaking at 3791.  Cardiac catheterization revealed a focal mid AV groove circumflex stenosis which underwent PCI and drug-eluting stenting successfully.  The patient is on dual antiplatelet therapy.  He is asymptomatic.  Ischemic cardiomyopathy-EF 35 to 40%.  Begin guideline directed optimal medical therapy, changing metoprolol to carvedilol, adding Entresto and spironolactone.  HLD: LDL 106 -- started on high dose statin  DM: Hgb A1c 6.3 -- SSI while inpatient --Begin Farxiga  ADHD: on adderall PTA, will hold now with concerns for ACS  Depression: on Wellbutrin  For questions or updates, please contact [place] Please consult [email or web address] for contact info under        Signed, Quay Burow, MD  10/19/2020, 9:20 AM

## 2020-10-20 ENCOUNTER — Other Ambulatory Visit: Payer: Self-pay | Admitting: Physician Assistant

## 2020-10-20 ENCOUNTER — Other Ambulatory Visit (HOSPITAL_COMMUNITY): Payer: Self-pay

## 2020-10-20 ENCOUNTER — Encounter (HOSPITAL_COMMUNITY): Payer: Self-pay | Admitting: Diagnostic Radiology

## 2020-10-20 ENCOUNTER — Telehealth: Payer: Self-pay | Admitting: General Practice

## 2020-10-20 DIAGNOSIS — E782 Mixed hyperlipidemia: Secondary | ICD-10-CM

## 2020-10-20 DIAGNOSIS — N179 Acute kidney failure, unspecified: Secondary | ICD-10-CM

## 2020-10-20 LAB — BASIC METABOLIC PANEL
Anion gap: 8 (ref 5–15)
BUN: 17 mg/dL (ref 8–23)
CO2: 29 mmol/L (ref 22–32)
Calcium: 9.2 mg/dL (ref 8.9–10.3)
Chloride: 101 mmol/L (ref 98–111)
Creatinine, Ser: 1.87 mg/dL — ABNORMAL HIGH (ref 0.61–1.24)
GFR, Estimated: 39 mL/min — ABNORMAL LOW (ref >60)
Glucose, Bld: 107 mg/dL — ABNORMAL HIGH (ref 70–99)
Potassium: 4.8 mmol/L (ref 3.5–5.1)
Sodium: 138 mmol/L (ref 135–145)

## 2020-10-20 LAB — GLUCOSE, CAPILLARY
Glucose-Capillary: 90 mg/dL (ref 70–99)
Glucose-Capillary: 127 mg/dL — ABNORMAL HIGH (ref 70–99)

## 2020-10-20 MED ORDER — ASPIRIN 81 MG PO TBEC
81.0000 mg | DELAYED_RELEASE_TABLET | Freq: Every day | ORAL | 3 refills | Status: DC
  Filled 2020-10-20: qty 90, 90d supply, fill #0

## 2020-10-20 MED ORDER — DAPAGLIFLOZIN PROPANEDIOL 10 MG PO TABS
10.0000 mg | ORAL_TABLET | Freq: Every day | ORAL | 11 refills | Status: DC
  Filled 2020-10-20: qty 30, 30d supply, fill #0

## 2020-10-20 MED ORDER — INFLUENZA VAC A&B SA ADJ QUAD 0.5 ML IM PRSY: 0.5000 mL | PREFILLED_SYRINGE | INTRAMUSCULAR | Status: DC

## 2020-10-20 MED ORDER — CARVEDILOL 3.125 MG PO TABS
3.1250 mg | ORAL_TABLET | Freq: Two times a day (BID) | ORAL | 11 refills | Status: DC
  Filled 2020-10-20: qty 60, 30d supply, fill #0

## 2020-10-20 MED ORDER — NITROGLYCERIN 0.4 MG SL SUBL
0.4000 mg | SUBLINGUAL_TABLET | SUBLINGUAL | 1 refills | Status: DC | PRN
  Filled 2020-10-20: qty 25, 8d supply, fill #0
  Filled 2020-11-10: qty 25, 8d supply, fill #1

## 2020-10-20 MED ORDER — INFLUENZA VAC A&B SA ADJ QUAD 0.5 ML IM PRSY
0.5000 mL | PREFILLED_SYRINGE | Freq: Once | INTRAMUSCULAR | Status: AC
  Administered 2020-10-20: 0.5 mL via INTRAMUSCULAR
  Filled 2020-10-20: qty 0.5

## 2020-10-20 MED ORDER — PNEUMOCOCCAL VAC POLYVALENT 25 MCG/0.5ML IJ INJ
0.5000 mL | INJECTION | Freq: Once | INTRAMUSCULAR | Status: AC
  Administered 2020-10-20: 0.5 mL via INTRAMUSCULAR
  Filled 2020-10-20: qty 0.5

## 2020-10-20 MED ORDER — ATORVASTATIN CALCIUM 80 MG PO TABS
80.0000 mg | ORAL_TABLET | Freq: Every day | ORAL | 3 refills | Status: DC
  Filled 2020-10-20: qty 30, 30d supply, fill #0

## 2020-10-20 MED ORDER — TICAGRELOR 90 MG PO TABS
90.0000 mg | ORAL_TABLET | Freq: Two times a day (BID) | ORAL | 11 refills | Status: DC
  Filled 2020-10-20: qty 60, 30d supply, fill #0

## 2020-10-20 MED ORDER — PNEUMOCOCCAL VAC POLYVALENT 25 MCG/0.5ML IJ INJ: 0.5000 mL | INJECTION | INTRAMUSCULAR | Status: DC

## 2020-10-20 NOTE — Telephone Encounter (Signed)
Pt is still in the hospital will hall back after dc

## 2020-10-20 NOTE — Telephone Encounter (Signed)
TOC Justin Carroll- Please call Justin Carroll-   Justin Carroll have an appointment with Coletta Memos on 10-30-20

## 2020-10-20 NOTE — Care Management Important Message (Signed)
Important Message  Patient Details  Name: Justin Carroll MRN: [id number] Date of Birth: 1955/04/19   Medicare Important Message Given:  Yes     Orbie Pyo 10/20/2020, 3:26 PM

## 2020-10-20 NOTE — Care Management (Signed)
1032 10-20-20 Benefits check submitted for Justin Carroll, and Entresto. Case Manager will follow for cost.

## 2020-10-20 NOTE — Discharge Summary (Addendum)
Discharge Summary    Patient ID: Justin Carroll MRN: [id number]; DOB: 08/14/1955  Admit date: 10/17/2020 Discharge date: 10/20/2020  PCP:  [facility], [place] [facility] Cardiologist:  Quay Burow, MD   {    Discharge Diagnoses    Active Problems:   NSTEMI (non-ST elevated myocardial infarction) ([place])  CAD  ICM HLD ADHD AKI    Diagnostic Studies/Procedures    2D echocardiogram (10/18/2020)   IMPRESSIONS     1. Left ventricular ejection fraction, by estimation, is 35 to 40%. The  left ventricle has moderately decreased function. The left ventricle  demonstrates global hypokinesis. There is mild concentric left ventricular  hypertrophy. Left ventricular  diastolic parameters are consistent with Grade I diastolic dysfunction  (impaired relaxation).   2. Right ventricular systolic function is normal. The right ventricular  size is normal.   3. Right atrial size was mildly dilated.   4. The mitral valve is normal in structure. Mild mitral valve  regurgitation. No evidence of mitral stenosis.   5. The aortic valve is tricuspid. Aortic valve regurgitation is not  visualized. No aortic stenosis is present.   6. Aortic dilatation noted. There is mild dilatation of the aortic root,  measuring 40 mm. There is borderline dilatation of the ascending aorta,  measuring 36 mm.   7. The inferior vena cava is normal in size with greater than 50%  respiratory variability, suggesting right atrial pressure of 3 mmHg.      Cardiac catheterization/PCI drug-eluting stent (10/18/2020)   Conclusion       Prox RCA lesion is 20% stenosed.   Prox Cx to Mid Cx lesion is 85% stenosed.   A drug-eluting stent was successfully placed using a STENT ONYX FRONTIER 3.5X15.   Post intervention, there is a 0% residual stenosis.   The left ventricular systolic function is normal.   LV end diastolic pressure is normal.   The left ventricular ejection fraction is  55-65% by visual estimate.   1.  Severe one-vessel coronary artery disease involving the mid left circumflex which is likely culprit for non-STEMI. 2. Successful OCT guided DES placement to mid LCX.    Recommendations:  Dual antiplatelet therapy for at least 12 months. Aggressive treatment of risk factors. Likely discharge home tomorrow. D  Diagnostic Dominance: Right Intervention     History of Present Illness     Justin Carroll is a 65 y.o. male with adult ADHD, depression, sinusitis seen for CP.   Patient had sudden onset chest pain during client meeting. 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  He developed substernal chest pain that radiated across his chest and down his left arm with associated diaphoresis.  He wasn't particularly dyspneic but has felt fatigued for the alst couple of days prior to this episode.  Given the symptoms he presented to the [facility] ED where he was found to have mild troponin elevation, started on aspirin and heparin, and sent to our facility  Hospital Course     Consultants: None   NSTEMI -High-sensitivity troponin peaked at 3791.  Treated with IV heparin. Cardiac cath showed severe one-vessel coronary artery disease involving the mid left circumflex s/p successful OCT guided DES placement to mid LCX.  Echocardiogram showed reduced LV function. -No recurrent chest pain.  Ambulated well. -Plan to treat with aspirin and Brilinta for at least 12 months -Continue statin and beta-blocker  2.  Ischemic cardiomyopathy -Echocardiogram showed LV  function of 35 to 40% and grade 1 diastolic dysfunction.  Mild mitral regurgitation.  Mild dilatation of aortic root and ascending aorta. -Patient was not grossly volume overload on exam -Treated with carvedilol and Farxiga -Added Entresto but given bump in creatinine discontinued at discharge.  Plan to rechallenge in outpatient setting. -May try spironolactone in outpatient  setting -Repeat echocardiogram in 3 months  3.  Hyperlipidemia -10/18/2020: Cholesterol 191; HDL 61; LDL Cholesterol 106; Triglycerides 120; VLDL 24  -Started on high intensity statin -Lipid panel and LFTs in 8 weeks  4.  Adult ADHD - on adderall PTA, DC with concerns for ACS.  He will follow-up with his MD in outpatient setting.  Patient is aware of this.  5.  Depression -Continue Wellbutrin  6.  AKI  -Baseline creatinine sounds like 1.2  Ref. Range 06/15/2020 00:00 10/17/2020 16:21 10/18/2020 00:16 10/19/2020 03:51 10/20/2020 02:10  Creatinine Latest Ref Range: 0.61 - 1.24 mg/dL 1.29 (H) 1.25 (H) 1.23 1.47 (H) 1.87 (H)  -Could be due to contrast-induced nephropathy -However creatinine bumped to 1.87 from 1.47 after starting stop.  Now discontinued. -BMP early next week   Did the patient have an acute coronary syndrome (MI, NSTEMI, STEMI, etc) this admission?:  Yes                               AHA/ACC Clinical Performance & Quality Measures: Aspirin prescribed? - Yes ADP Receptor Inhibitor (Plavix/Clopidogrel, Brilinta/Ticagrelor or Effient/Prasugrel) prescribed (includes medically managed patients)? - Yes Beta Blocker prescribed? - Yes High Intensity Statin (Lipitor 40-'80mg'$  or Crestor 20-'40mg'$ ) prescribed? - Yes EF assessed during THIS hospitalization? - Yes For EF <40%, was ACEI/ARB prescribed? - NO -  Discontinued Entresto due to increase in creatinine For EF <40%, Aldosterone Antagonist (Spironolactone or Eplerenone) prescribed? - NO, due to elevated SCr Cardiac Rehab Phase II ordered (including medically managed patients)? - Yes   Discharge Vitals Blood pressure 119/77, pulse 73, temperature 97.8 F (36.6 C), temperature source Oral, resp. rate 19, height '5\' 7"'$  (1.702 m), weight 81.5 kg, SpO2 95 %.  Filed Weights   10/17/20 1946 10/17/20 2300 10/18/20 1056  Weight: 84.8 kg 82.1 kg 81.5 kg   Physical Exam Constitutional:      Appearance: He is well-developed.  Eyes:      Pupils: Pupils are equal, round, and reactive to light.  Cardiovascular:     Rate and Rhythm: Normal rate and regular rhythm.     Heart sounds: Normal heart sounds.  Pulmonary:     Effort: Pulmonary effort is normal.     Breath sounds: Normal breath sounds.  Musculoskeletal:        General: Normal range of motion.  Skin:    General: Skin is warm and dry.  Neurological:     General: No focal deficit present.     Mental Status: He is alert and oriented to person, place, and time.  Psychiatric:        Mood and Affect: Mood normal.        Behavior: Behavior normal.     Labs & Radiologic Studies    CBC Recent Labs    10/18/20 0016 10/19/20 0351  WBC 10.0 7.3  HGB 16.5 16.9  HCT 51.2 53.4*  MCV 86.9 89.1  PLT 253 [id number]   Basic Metabolic Panel Recent Labs    10/19/20 0351 10/20/20 0210  NA 138 138  K 4.2 4.8  CL 104 101  CO2 27 29  GLUCOSE 88 107*  BUN 18 17  CREATININE 1.47* 1.87*  CALCIUM 8.2* 9.2   Liver Function Tests Recent Labs    10/17/20 1621  AST 23  ALT 26  ALKPHOS 62  BILITOT 0.7  PROT 7.3  ALBUMIN 4.1    High Sensitivity Troponin:   Recent Labs  Lab 10/17/20 1621 10/17/20 1801 10/18/20 0546 10/18/20 0748  TROPONINIHS 64* 88* 3,791* 3,200*     Hemoglobin A1C Recent Labs    10/18/20 0016  HGBA1C 6.3*   Fasting Lipid Panel Recent Labs    10/18/20 0233  CHOL 191  HDL 61  LDLCALC 106*  TRIG 120  CHOLHDL 3.1   Thyroid Function Tests Recent Labs    10/18/20 0016  TSH 0.592   _____________  DG Chest 2 View  Result Date: 10/17/2020 CLINICAL DATA:  Chest pain. EXAM: CHEST - 2 VIEW COMPARISON:  None. FINDINGS: The heart size and mediastinal contours are within normal limits. Both lungs are clear. The visualized skeletal structures are unremarkable. IMPRESSION: No active cardiopulmonary disease. Electronically Signed   By: Marijo Conception M.D.   On: 10/17/2020 16:55   CARDIAC CATHETERIZATION  Result Date: 10/19/2020   Prox RCA  lesion is 20% stenosed.   Prox Cx to Mid Cx lesion is 85% stenosed.   A drug-eluting stent was successfully placed using a STENT ONYX FRONTIER 3.5X15.   Post intervention, there is a 0% residual stenosis.   The left ventricular systolic function is normal.   LV end diastolic pressure is normal.   The left ventricular ejection fraction is 55-65% by visual estimate. 1.  Severe one-vessel coronary artery disease involving the mid left circumflex which is likely culprit for non-STEMI. 2. Successful OCT guided DES placement to mid LCX. Recommendations: Dual antiplatelet therapy for at least 12 months. Aggressive treatment of risk factors. Likely discharge home tomorrow.   ECHOCARDIOGRAM COMPLETE  Result Date: 10/18/2020    ECHOCARDIOGRAM REPORT   Patient Name:   ALGENIS FRERE Date of Exam: 10/18/2020 Medical Rec #:  [id number]    Height:       67.0 in Accession #:    [id number]   Weight:       179.6 lb Date of Birth:  March 18, 1955    BSA:          1.932 m Patient Age:    5 years     BP:           106/78 mmHg Patient Gender: M            HR:           62 bpm. Exam Location:  Inpatient Procedure: 2D Echo, Cardiac Doppler, Color Doppler and Intracardiac            Opacification Agent Indications:    Chest Pain R07.9  History:        Patient has no prior history of Echocardiogram examinations. CAD                 and Acute MI, Cardiac cath today; Signs/Symptoms:Chest Pain.  Sonographer:    Merrie Roof RDCS Referring Phys: [id number] [place]  1. Left ventricular ejection fraction, by estimation, is 35 to 40%. The left ventricle has moderately decreased function. The left ventricle demonstrates global hypokinesis. There is mild concentric left ventricular hypertrophy. Left ventricular diastolic parameters are consistent with Grade I diastolic dysfunction (impaired relaxation).  2. Right ventricular systolic function is normal. The right  ventricular size is normal.  3. Right atrial size was mildly dilated.  4.  The mitral valve is normal in structure. Mild mitral valve regurgitation. No evidence of mitral stenosis.  5. The aortic valve is tricuspid. Aortic valve regurgitation is not visualized. No aortic stenosis is present.  6. Aortic dilatation noted. There is mild dilatation of the aortic root, measuring 40 mm. There is borderline dilatation of the ascending aorta, measuring 36 mm.  7. The inferior vena cava is normal in size with greater than 50% respiratory variability, suggesting right atrial pressure of 3 mmHg. FINDINGS  Left Ventricle: Left ventricular ejection fraction, by estimation, is 35 to 40%. The left ventricle has moderately decreased function. The left ventricle demonstrates global hypokinesis. The left ventricular internal cavity size was normal in size. There is mild concentric left ventricular hypertrophy. Left ventricular diastolic parameters are consistent with Grade I diastolic dysfunction (impaired relaxation). Normal left ventricular filling pressure. Right Ventricle: The right ventricular size is normal. No increase in right ventricular wall thickness. Right ventricular systolic function is normal. Left Atrium: Left atrial size was normal in size. Right Atrium: Right atrial size was mildly dilated. Pericardium: There is no evidence of pericardial effusion. Mitral Valve: The mitral valve is normal in structure. Mild mitral valve regurgitation. No evidence of mitral valve stenosis. Tricuspid Valve: The tricuspid valve is normal in structure. Tricuspid valve regurgitation is not demonstrated. No evidence of tricuspid stenosis. Aortic Valve: The aortic valve is tricuspid. Aortic valve regurgitation is not visualized. No aortic stenosis is present. Aortic valve mean gradient measures 2.0 mmHg. Aortic valve peak gradient measures 4.5 mmHg. Aortic valve area, by VTI measures 2.59 cm. Pulmonic Valve: The pulmonic valve was normal in structure. Pulmonic valve regurgitation is not visualized. No evidence  of pulmonic stenosis. Aorta: Aortic dilatation noted. There is mild dilatation of the aortic root, measuring 40 mm. There is borderline dilatation of the ascending aorta, measuring 36 mm. Venous: The inferior vena cava is normal in size with greater than 50% respiratory variability, suggesting right atrial pressure of 3 mmHg. IAS/Shunts: No atrial level shunt detected by color flow Doppler.  LEFT VENTRICLE PLAX 2D LVIDd:         4.60 cm      Diastology LVIDs:         3.30 cm      LV e' medial:    8.05 cm/s LV PW:         1.20 cm      LV E/e' medial:  6.8 LV IVS:        1.10 cm      LV e' lateral:   8.59 cm/s LVOT diam:     2.20 cm      LV E/e' lateral: 6.4 LV SV:         50 LV SV Index:   26 LVOT Area:     3.80 cm  LV Volumes (MOD) LV vol d, MOD A2C: 107.0 ml LV vol d, MOD A4C: 150.0 ml LV vol s, MOD A2C: 57.9 ml LV vol s, MOD A4C: 86.8 ml LV SV MOD A2C:     49.1 ml LV SV MOD A4C:     150.0 ml LV SV MOD BP:      51.8 ml RIGHT VENTRICLE RV Basal diam:  3.60 cm RV S prime:     7.58 cm/s TAPSE (M-mode): 1.8 cm LEFT ATRIUM           Index  RIGHT ATRIUM           Index LA diam:      3.90 cm 2.02 cm/m  RA Area:     19.40 cm LA Vol (A4C): 33.4 ml 17.29 ml/m RA Volume:   62.60 ml  32.40 ml/m  AORTIC VALVE AV Area (Vmax):    2.57 cm AV Area (Vmean):   2.39 cm AV Area (VTI):     2.59 cm AV Vmax:           106.00 cm/s AV Vmean:          68.700 cm/s AV VTI:            0.192 m AV Peak Grad:      4.5 mmHg AV Mean Grad:      2.0 mmHg LVOT Vmax:         71.70 cm/s LVOT Vmean:        43.200 cm/s LVOT VTI:          0.131 m LVOT/AV VTI ratio: 0.68  AORTA Ao Root diam: 3.95 cm Ao Asc diam:  3.60 cm MITRAL VALVE MV Area (PHT): 4.31 cm    SHUNTS MV Decel Time: 176 msec    Systemic VTI:  0.13 m MV E velocity: 55.10 cm/s  Systemic Diam: 2.20 cm MV A velocity: 68.00 cm/s MV E/A ratio:  0.81 Skeet Latch MD Electronically signed by Skeet Latch MD Signature Date/Time: 10/18/2020/11:33:02 PM    Final    CT Angio  Chest/Abd/Pel for Dissection W and/or Wo Contrast  Result Date: 10/17/2020 CLINICAL DATA:  Abdominal pain, aortic dissection suspected. that radiates from left side of chest down left arm that began earlier toda EXAM: CT ANGIOGRAPHY CHEST, ABDOMEN AND PELVIS TECHNIQUE: Non-contrast CT of the chest was initially obtained. Multidetector CT imaging through the chest, abdomen and pelvis was performed using the standard protocol during bolus administration of intravenous contrast. Multiplanar reconstructed images and MIPs were obtained and reviewed to evaluate the vascular anatomy. CONTRAST:  10m OMNIPAQUE IOHEXOL 350 MG/ML SOLN COMPARISON:  None. FINDINGS: CTA CHEST FINDINGS Cardiovascular: Preferential opacification of the thoracic aorta. No evidence of thoracic aortic aneurysm or dissection. Mild atherosclerotic plaque of the thoracic aorta. Normal heart size. No significant pericardial effusion.No coronary artery calcifications. The main pulmonary artery is normal in caliber with no central pulmonary embolus. Mediastinum/Nodes: No enlarged mediastinal, hilar, or axillary lymph nodes. Thyroid gland, trachea, and esophagus demonstrate no significant findings. Lungs/Pleura: Right upper lobe ground-glass airspace opacity measuring up to 0.6 cm (7:70). Subpleural left lower lobe 4 mm pulmonary micronodule (7:88). No focal consolidation. No pulmonary mass. No pleural effusion. No pneumothorax. Musculoskeletal: No chest wall abnormality. No suspicious lytic or blastic osseous lesions. No acute displaced fracture. Mild compression fracture of the T12 vertebral body with a prominent superior endplate Schmorl node. Large inferior endplate Schmorl noted the T9 vertebral body. Review of the MIP images confirms the above findings. CTA ABDOMEN AND PELVIS FINDINGS VASCULAR Aorta: Mild atherosclerotic plaque. Normal caliber aorta without aneurysm, dissection, vasculitis or significant stenosis. Poorly visualized narrowing of the  origin of the left common carotid artery. Incidentally noted replaced left vertebral artery with origin off of the aortic arch just distal to the left common carotid artery origin. Celiac: Patent without evidence of aneurysm, dissection, vasculitis or significant stenosis. SMA: Patent without evidence of aneurysm, dissection, vasculitis or significant stenosis. Renals: Both renal arteries are patent without evidence of aneurysm, dissection, vasculitis, fibromuscular dysplasia or significant stenosis. IMA: Patent without evidence  of aneurysm, dissection, vasculitis or significant stenosis. Inflow: Patent without evidence of aneurysm, dissection, vasculitis or significant stenosis. Veins: No obvious venous abnormality within the limitations of this arterial phase study. Review of the MIP images confirms the above findings. NON-VASCULAR Hepatobiliary: Subcentimeter hypodensity too small to characterize (5:86). No focal liver abnormality. No gallstones, gallbladder wall thickening, or pericholecystic fluid. No biliary dilatation. Pancreas: No focal lesion. Normal pancreatic contour. No surrounding inflammatory changes. No main pancreatic ductal dilatation. Spleen: Normal in size without focal abnormality. Adrenals/Urinary Tract: No adrenal nodule bilaterally. Bilateral kidneys enhance symmetrically. There is a 3.6 cm fluid density lesion within left kidney that likely represents a simple renal cyst. No hydronephrosis. No hydroureter. The urinary bladder is unremarkable. Stomach/Bowel: Stomach is within normal limits. No evidence of bowel wall thickening or dilatation. Scattered colonic diverticulosis. Appendix appears normal. Lymphatic: No lymphadenopathy. Reproductive: Prostate is enlarged measuring up to 5 cm. Other: No intraperitoneal free fluid. No intraperitoneal free gas. No organized fluid collection. Musculoskeletal: Tiny fat containing right inguinal hernia. No suspicious lytic or blastic osseous lesions. No  acute displaced fracture. Multilevel degenerative changes of the spine. Age-indeterminate compression fracture of the L2 vertebral body with greater than 60% height loss. Review of the MIP images confirms the above findings. IMPRESSION: 1. No aortic dissection or aneurysm. Aortic Atherosclerosis (ICD10-I70.0) - mild. 2. Poorly visualized narrowing of the origin of the left common carotid artery. Versus artifact due to motion and streak artifact. 3. 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 4. A right upper lobe 6 mm ground-glass nodular airspace opacity. Initial follow-up with CT at 6-12 months is recommended to confirm persistence. If persistent, repeat CT is recommended every 2 years until 5 years of stability has been established. This recommendation follows the consensus statement: Guidelines for Management of Incidental Pulmonary Nodules Detected on CT Images: From the Fleischner Society 2017; Radiology 2017; 284:228-243. 5. Prostatomegaly. 6. Age-indeterminate compression fracture of the T12 and L2 vertebral bodies. Electronically Signed   By: Iven Finn M.D.   On: 10/17/2020 18:32   Disposition   Pt is being discharged home today in good condition.  Follow-up Plans & Appointments     Follow-up Information     Deberah Pelton, NP Follow up on 10/30/2020.   Specialty: Cardiology Why: '@11'$ :15 am for hospital follow up. Please arrive 15 minutes early Contact information: [address] [place] [address] [place] Follow up on 10/25/2020.   Specialty: Cardiology Why: for blood work between [employer] information: [address] [place] [phone]               Discharge Instructions     Amb Referral to Cardiac Rehabilitation   Complete by: As directed    Works in [place]   Diagnosis:  Coronary Stents NSTEMI     After initial evaluation  and assessments completed: Virtual Based Care may be provided alone or in conjunction with Phase 2 Cardiac Rehab based on patient barriers.: Yes   Diet - low sodium heart healthy   Complete by: As directed    Discharge instructions   Complete by: As directed    NO HEAVY LIFTING (>10lbs) X 2 WEEKS. NO SEXUAL ACTIVITY X 2 WEEKS. NO DRIVING X 1 WEEK. NO SOAKING BATHS, HOT TUBS, POOLS, ETC., X 7 DAYS.   Increase activity slowly   Complete by: As directed  Discharge Medications   Allergies as of 10/20/2020   No Known Allergies      Medication List     STOP taking these medications    amphetamine-dextroamphetamine 20 MG tablet Commonly known as: Adderall       TAKE these medications    aspirin 81 MG EC tablet Take 1 tablet (81 mg total) by mouth daily. Swallow whole.   atorvastatin 80 MG tablet Commonly known as: LIPITOR Take 1 tablet (80 mg total) by mouth daily.   Azelastine HCl 137 MCG/SPRAY Soln USE 2 SPRAY(S) IN EACH NOSTRIL TWICE DAILY AS NEEDED FOR RHINITIS What changed: See the new instructions.   buPROPion 300 MG 24 hr tablet Commonly known as: WELLBUTRIN XL Take 1 tablet by mouth once daily   carvedilol 3.125 MG tablet Commonly known as: COREG Take 1 tablet (3.125 mg total) by mouth 2 (two) times daily with a meal.   dapagliflozin propanediol 10 MG Tabs tablet Commonly known as: FARXIGA Take 1 tablet (10 mg total) by mouth daily.   levocetirizine 5 MG tablet Commonly known as: XYZAL Take 5 mg by mouth every evening.   mometasone 50 MCG/ACT nasal spray Commonly known as: NASONEX Place 1 spray into the nose daily.   nitroGLYCERIN 0.4 MG SL tablet Commonly known as: NITROSTAT Place 1 tablet (0.4 mg total) under the tongue every 5 (five) minutes x 3 doses as needed for chest pain.   pantoprazole 40 MG tablet Commonly known as: PROTONIX Take 1 tablet by mouth once daily   tamsulosin 0.4 MG Caps capsule Commonly known as: FLOMAX TAKE 1  CAPSULE BY MOUTH ONCE DAILY AFTER SUPPER What changed: See the new instructions.   testosterone cypionate 200 MG/ML injection Commonly known as: DEPOTESTOSTERONE CYPIONATE INJECT 0.5 MLS INTO THE MUSCLE TWICE A WEEK.   ticagrelor 90 MG Tabs tablet Commonly known as: BRILINTA Take 1 tablet (90 mg total) by mouth 2 (two) times daily.   traZODone 100 MG tablet Commonly known as: DESYREL TAKE 1 TABLET BY MOUTH AT BEDTIME   Vitamin D-3 25 MCG (1000 UT) Caps Take 2 capsules by mouth daily.         Outstanding Labs/Studies   BMP next week  Lipid panel and LFTS in 8 weeks   Duration of Discharge Encounter   Greater than 30 minutes including physician time.  SignedCrista Luria [place], PA 10/20/2020, 10:56 AM  Agree with note by Robbie Lis PA-C  [name] was admitted with a non-STEMI.  His troponins went to 3700.  He underwent OCT guided AV groove circumflex PCI drug-eluting stenting.  He had no other significant CAD.  His 2D echo did reveal moderate LV dysfunction with an EF in the 35 to 40% range.  He was started on low-dose carvedilol and Entresto.  Fortunately his serum creatinine did increase to 1.87.  This may be related to contrast nephropathy plus or minus Entresto.  His blood pressure is fairly soft in the systolic [id number] range.  He is ambulating the hallway without symptoms.  Plan to hold Entresto, keep carvedilol at the same dose.  Stable for discharge today.  We will follow-up with basic metabolic panel early next week and then arrange TOC 7 after which he will see me back in 4 to 6 weeks.  Lorretta Harp, M.D., [place], [facility], [place] [facility] [address] [place], [place]  [address]  [phone] 10/20/2020 11:05 AM

## 2020-10-20 NOTE — Progress Notes (Signed)
CARDIAC REHAB PHASE I   PRE:  Rate/Rhythm:   BP:  Supine:   Sitting: 118/83  Standing:    SaO2:   MODE:  Ambulation: 800 ft   POST:  Rate/Rhythm: 77 SR  BP:  Supine:   Sitting: 119/77  Standing:    SaO2: 95%RA 0915-1010 Pt walked 800 ft on RA with steady gait and tolerated well. No CP. Slightly SOB noted. Gave CHF booklet and reviewed zones and when to call MD. Encouraged daily weights and 2000 mg sodium restriction. Low sodium handouts given. Pt very interested in starting CRP 2 as soon as possible. Referral sent to [place].    Graylon Good, RN BSN  10/20/2020 10:04 AM  74 SR

## 2020-10-20 NOTE — Progress Notes (Signed)
Heart Failure Nurse Navigator Progress Note  PCP: [place] PCP-Cardiologist: none Admission Diagnosis:NSTEMI Admitted from: home with spouse and 2 dependent children  Presentation:   Justin Carroll presented 9/14 with chest pain, underwent LHC DES placed to LCX. Pt and spouse interactive with interview process. 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 Met with financial hardship during covid due to court shutting down--have since filed for Chapter 9 bankruptcy. Have some concern regarding future medication costs as he does not have medicare prescription coverage. Pharmacy team to assist at [place] appt 9/20. Pt states he has 7 children, 2 are still dependently living in the home. Spouse inquired about LifeVest, confirmed with Dr. Gwenlyn Found, no vest needed--expect increase in EF to 40+% within the next couple weeks. Stopped adderall with concerns for ACS per Dr. Gwenlyn Found, family has concern regarding job stressors. To follow with PCP. Holding [place] upon DC related to creatinine increased to 1.87. Reevaluate in outpt setting. Pt understands he is not to drive x1 week.   ECHO/ LVEF: 35-50%, G1DD  Clinical Course:  Past Medical History:  Diagnosis Date   Depression    Sinus infection    yearly     Social History   Socioeconomic History   Marital status: Married    Spouse name: Presenter, broadcasting   Number of children: 7   Years of education: Not on file   Highest education level: Professional school degree (e.g., MD, DDS, DVM, JD)  Occupational History   Occupation: Lawyer    Comment: self employeed  Tobacco Use   Smoking status: Former    Packs/day: 1.50    Years: 10.00    Pack years: 15.00    Types: Cigarettes    Start date: 1995    Quit date: 2015    Years since quitting: 7.7   Smokeless tobacco: Never  Vaping Use   Vaping Use: Never used  Substance and Sexual Activity   Alcohol use: No   Drug use: No   Sexual activity: Not on file  Other  Topics Concern   Not on file  Social History Narrative   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   Social Determinants of Health   Financial Resource Strain: Medium Risk   Difficulty of Paying Living Expenses: Somewhat hard  Food Insecurity: No Food Insecurity   Worried About Charity fundraiser in the Last Year: Never true   Ran Out of Food in the Last Year: Never true  Transportation Needs: No Transportation Needs   Lack of Transportation (Medical): No   Lack of Transportation (Non-Medical): No  Physical Activity: Not on file  Stress: Not on file  Social Connections: Not on file    High Risk Criteria for Readmission and/or Poor Patient Outcomes: Heart failure hospital admissions (last 6 months): 1  No Show rate: 0% Difficult social situation: no Demonstrates medication adherence: yes Primary Language: English Literacy level: able to read/write and comprehend. Wears reading glasses.  Education Assessment and Provision:  Detailed education and instructions provided on heart failure disease management including the following:  Signs and symptoms of Heart Failure When to call the physician Importance of daily weights Low sodium diet Fluid restriction Medication management Anticipated future follow-up appointments  Patient education given on each of the above topics.  Patient acknowledges understanding via teach back method and acceptance of all instructions.  Education Materials:  "Living Better With Heart Failure" Booklet, HF zone tool, & Daily Weight Tracker Tool.  Patient has  scale at home: yes Patient has pill box at home: no, will give at [facility] TOC appt (no stock)   Barriers of Care:   -new dx  Considerations/Referrals:   Referral made to Heart Failure Pharmacist Stewardship: yes, to see at [place] Referral made to Heart Failure CSW/NCM TOC: no Referral made to Heart & Vascular TOC clinic: yes, 9/20 @ 9AM  Items for Follow-up on DC/TOC: -optimize -HF education -needs  pill box  Please draw BMP at [place] per inpt cardiology.   Pricilla Holm, MSN, RN Heart Failure Nurse Navigator [phone]

## 2020-10-22 ENCOUNTER — Telehealth: Payer: Self-pay | Admitting: Cardiology

## 2020-10-22 ENCOUNTER — Encounter: Payer: Self-pay | Admitting: Neurology

## 2020-10-22 ENCOUNTER — Encounter: Payer: Self-pay | Admitting: Gastroenterology

## 2020-10-22 NOTE — Telephone Encounter (Signed)
Patient wife called in reporting she in concerned as her husband has not been himself over the past 24 hours. Recently admitted with NSTEMI and PCI of the Lcx. His adderrall was stopped with newly dx CAD. Does have an appt with neurology but not until next month. Says she not concerned for any acute neurologic change, ie stroke. Thinks he is just very stressed and having trouble adjusting to new meds and dx of CAD. I advised if concerned from a psych standpoint would seek out evaluation today, but she denied concern for safety/harm to self or others. Offered to send a message to [facility] HF clinic see if his appt could be moved up until tomorrow, which she was agreeable to and thanked me for follow up call. Will reach out to [facility] HF in regards to possible earlier appt.

## 2020-10-23 ENCOUNTER — Telehealth (HOSPITAL_COMMUNITY): Payer: Self-pay

## 2020-10-23 NOTE — Progress Notes (Signed)
HEART & VASCULAR TRANSITION OF CARE CONSULT NOTE     Referring Physician: Dr Gwenlyn Found  Primary Care: Former patient of Dr Zonia Kief Amargosa PA Primary Cardiologist: Dr Gwenlyn Found  Neurology: Dr Beacher May  HF MD: Dr Aundra Dubin   HPI: Referred to clinic by Dr Gwenlyn Found for heart failure consultation.   Justin Carroll is a 65 year old with a history of ADHD, depression, mild cognitive impairment, former heavy drinker (has not had alcohol in > 30 years), and recent NSTEMI.   Presented to [facility] 10[facility] with chest pain. HS trop elevated. Had NSTEMI and underwent cath that showed severe one vessel disease mid left circumflex. He underwent OCT guided DES to mid LCX. Plan dual antiplatelet therapy x 12 months. Justin Carroll was added but had AKI so stopped prior to discharge. Prior to admit he was taking 20 mg Adderall everyday with an extra 20 mg 4-5 times a week. Adderall was stopped during hospitalization. Marland Kitchen   Presents for post HF/CAD hospital follow up with his wife.  Overall feeling a little tired. Stressed about his recent hospitalization. His wife reports that he has had severe outburst and mood swings which is completely different from his personality.  His wife says he snores a lot. SOB after walking 8 minutes. He has been walking 8 min twice a day. Denies PND/Orthopnea. Appetite ok. No bleeding issues. No fever or chills. Weight at home 174-175 pounds. Taking all medications.  Currently not working.   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 He has 7 children and is in the process of adopting a 66 year old.   Cardiac Testing  10/17/20 Echo EF 35-40% RV normal  LHC 10/18/20  Prox RCA lesion is 20% stenosed.   Prox Cx to Mid Cx lesion is 85% stenosed.   A drug-eluting stent was successfully placed using a STENT ONYX FRONTIER [id number].   Post intervention, there is a 0% residual stenosis.   The left ventricular systolic function is normal.   LV end diastolic pressure is normal.    The left ventricular ejection fraction is 55-65% by visual estimate.  1.  Severe one-vessel coronary artery disease involving the mid left circumflex which is likely culprit for non-STEMI. 2. Successful OCT guided DES placement to mid LCX.   Recommendations:  Dual antiplatelet therapy for at least 12 months. Aggressive treatment of risk factors.  Review of Systems: [y] = yes, '[ ]'$  = no   General: Weight gain '[ ]'$ ; Weight loss '[ ]'$ ; Anorexia '[ ]'$ ; Fatigue [ Y]; Fever '[ ]'$ ; Chills '[ ]'$ ; Weakness [ YY]  Cardiac: Chest pain/pressure '[ ]'$ ; Resting SOB '[ ]'$ ; Exertional SOB [ Y]; Orthopnea '[ ]'$ ; Pedal Edema '[ ]'$ ; Palpitations '[ ]'$ ; Syncope '[ ]'$ ; Presyncope '[ ]'$ ; Paroxysmal nocturnal dyspnea'[ ]'$   Pulmonary: Cough '[ ]'$ ; Wheezing'[ ]'$ ; Hemoptysis'[ ]'$ ; Sputum '[ ]'$ ; Snoring '[ ]'$   GI: Vomiting'[ ]'$ ; Dysphagia'[ ]'$ ; Melena'[ ]'$ ; Hematochezia '[ ]'$ ; Heartburn'[ ]'$ ; Abdominal pain '[ ]'$ ; Constipation '[ ]'$ ; Diarrhea '[ ]'$ ; BRBPR '[ ]'$   GU: Hematuria'[ ]'$ ; Dysuria '[ ]'$ ; Nocturia'[ ]'$   Vascular: Pain in legs with walking '[ ]'$ ; Pain in feet with lying flat '[ ]'$ ; Non-healing sores '[ ]'$ ; Stroke '[ ]'$ ; TIA '[ ]'$ ; Slurred speech '[ ]'$ ;  Neuro: Headaches'[ ]'$ ; Vertigo'[ ]'$ ; Seizures'[ ]'$ ; Paresthesias'[ ]'$ ;Blurred vision '[ ]'$ ; Diplopia '[ ]'$ ; Vision changes '[ ]'$   Ortho/Skin: Arthritis '[ ]'$ ; Joint pain [ Y]; Muscle pain '[ ]'$ ; Joint swelling '[ ]'$ ;  Back Pain '[ ]'$ ; Rash '[ ]'$   Psych: Depression[Y ]; Anxiety[ Y]  Heme: Bleeding problems '[ ]'$ ; Clotting disorders '[ ]'$ ; Anemia '[ ]'$   Endocrine: Diabetes '[ ]'$ ; Thyroid dysfunction'[ ]'$    Past Medical History:  Diagnosis Date   Depression    Sinus infection    yearly    Current Outpatient Medications  Medication Sig Dispense Refill   aspirin 81 MG EC tablet Take 1 tablet (81 mg total) by mouth daily. Swallow whole. 90 tablet 3   atorvastatin (LIPITOR) 80 MG tablet Take 1 tablet (80 mg total) by mouth daily. 90 tablet 3   Azelastine HCl 137 MCG/SPRAY SOLN USE 2 SPRAY(S) IN EACH NOSTRIL TWICE DAILY AS NEEDED FOR RHINITIS (Patient taking  differently: Place 2 sprays into both nostrils 2 (two) times daily as needed (nasal congestion).) 30 mL 2   buPROPion (WELLBUTRIN XL) 300 MG 24 hr tablet Take 1 tablet by mouth once daily 90 tablet 0   carvedilol (COREG) 3.125 MG tablet Take 1 tablet (3.125 mg total) by mouth 2 (two) times daily with a meal. 60 tablet 11   Cholecalciferol (VITAMIN D-3) 25 MCG (1000 UT) CAPS Take 2 capsules by mouth daily.     dapagliflozin propanediol (FARXIGA) 10 MG TABS tablet Take 1 tablet (10 mg total) by mouth daily. 30 tablet 11   levocetirizine (XYZAL) 5 MG tablet Take 5 mg by mouth every evening.     mometasone (NASONEX) 50 MCG/ACT nasal spray Place 1 spray into the nose daily. 17 g 5   nitroGLYCERIN (NITROSTAT) 0.4 MG SL tablet Place 1 tablet (0.4 mg total) under the tongue every 5 (five) minutes x 3 doses as needed for chest pain. 25 tablet 1   pantoprazole (PROTONIX) 40 MG tablet Take 1 tablet by mouth once daily 90 tablet 0   tamsulosin (FLOMAX) 0.4 MG CAPS capsule TAKE 1 CAPSULE BY MOUTH ONCE DAILY AFTER SUPPER (Patient taking differently: Take 0.4 mg by mouth daily after supper.) 90 capsule 0   testosterone cypionate (DEPOTESTOSTERONE CYPIONATE) 200 MG/ML injection INJECT 0.5 MLS INTO THE MUSCLE TWICE A WEEK. 10 mL 2   ticagrelor (BRILINTA) 90 MG TABS tablet Take 1 tablet (90 mg total) by mouth 2 (two) times daily. 60 tablet 11   traZODone (DESYREL) 100 MG tablet TAKE 1 TABLET BY MOUTH AT BEDTIME 90 tablet 0   No current facility-administered medications for this encounter.    No Known Allergies    Social History   Socioeconomic History   Marital status: Married    Spouse name: Presenter, broadcasting   Number of children: 7   Years of education: Not on file   Highest education level: Professional school degree (e.g., MD, DDS, DVM, JD)  Occupational History   Occupation: Lawyer    Comment: self employeed  Tobacco Use   Smoking status: Former    Packs/day: 1.50    Years: 10.00    Pack years:  15.00    Types: Cigarettes    Start date: 1995    Quit date: 2015    Years since quitting: 7.7   Smokeless tobacco: Never  Vaping Use   Vaping Use: Never used  Substance and Sexual Activity   Alcohol use: No   Drug use: No   Sexual activity: Not on file  Other Topics Concern   Not on file  Social History Narrative   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   Social Determinants of Health   Financial Resource Strain: Medium Risk   Difficulty  of Paying Living Expenses: Somewhat hard  Food Insecurity: No Food Insecurity   Worried About Running Out of Food in the Last Year: Never true   Ran Out of Food in the Last Year: Never true  Transportation Needs: No Transportation Needs   Lack of Transportation (Medical): No   Lack of Transportation (Non-Medical): No  Physical Activity: Not on file  Stress: Not on file  Social Connections: Not on file  Intimate Partner Violence: Not on file      Family History  Problem Relation Age of Onset   Allergic rhinitis Brother    Colon cancer Neg Hx    Pancreatic cancer Neg Hx    Rectal cancer Neg Hx    Stomach cancer Neg Hx     Vitals:   10/24/20 0919  BP: 110/78  Pulse: 73  SpO2: 97%  Weight: 80.3 kg (177 lb)   Wt Readings from Last 3 Encounters:  10/24/20 80.3 kg (177 lb)  10/18/20 81.5 kg (179 lb 9.6 oz)  09/14/20 82.6 kg (182 lb)    ReDS Vest / Clip - 10/24/20 0919       ReDS Vest / Clip   Station Marker C    Ruler Value 28    ReDS Value Range Moderate volume overload    ReDS Actual Value 37              PHYSICAL EXAM: General:   No respiratory difficulty HEENT: normal Neck: supple. no JVD. Carotids 2+ bilat; no bruits. No lymphadenopathy or thryomegaly appreciated. Cor: PMI nondisplaced. Regular rate & rhythm. No rubs, gallops or murmurs. Lungs: clear Abdomen: soft, nontender, nondistended. No hepatosplenomegaly. No bruits or masses. Good bowel sounds. Extremities: no cyanosis, clubbing, rash, edema Neuro: alert &  oriented x 3, cranial nerves grossly intact. moves all 4 extremities w/o difficulty. Affect pleasant.  ECG: SR 73 bpm QRS 23m    ASSESSMENT & PLAN:  1. HFrEF, suspect mixed ICM/NICM Echo 35-40%. LHC Single vessel CAD involving mid left circumflex. HF out of proportion to CAD. ?Possible OSA with snoring. Also has been taking Adderall for ADHD which was stopped during his hospitalization.  It is possible this is a mixed picture. For now he needs to stay off Adderall.  NYHA III. Volume status mildly elevated. Reds Clip 37%.  Does not need daily diuretics but will add entresto which should help with mild volume overload.  -Continue coreg 3.125 mg twice a day .  -Continue farxiga 10 mg daily.  - Add entresto 24-26 mg twice a day. Will give 30 day free card for entresto.  Discussed purpose of entresto.  -Hold off on spiro. Consider next visit. - Check BMET and BNP - Plan to check ECHO in 3 months. May need CMRI if no recovery.   2. CAD  -S/P LHC-Severe one-vessel coronary artery disease involving the mid left circumflex which is likely culprit for non-STEMIwith DES placement to mid LCX.  -On statin, aspirin, and brillinta.   - No chest pain He has been referred to cardiac rehab.  Check CBC   3 H/O AKI -Creatinine peaked during his hospitalization at 1.9. -Check BMET today  4. ADHD -Off adderall during recent hospitalization. I am concerned his mood swings and out burst are due to abrupt discontinuation of Adderrall.  -Discussed pharmacy and will add 12.5 mg sertraline daily. Continue wellbutrin.  - I also prescribed ativan for anxiety. Given low dose to daily as needed. Given script for 7 days then will follow up  with PCP.  - He follow up with his Neuro Psychiatrist in October.   5. Snores Will need home sleep study to evaluate for OSA.   Discussed HFSW and Pharm D during visit his plan and goals for the next 3 months.   Referred to HFSW: Yes coping strategies  Refer to Pharmacy:  Yes  Refer to Home Health: No  Refer to Advanced Heart Failure Clinic: Yes ---> Dr Aundra Dubin  Refer to General Cardiology: Shared   Follow up in 2 weeks with pharmacy and 6 weeks with Dr Aundra Dubin.  For now he needs to remain out of work.  Greater than 50% of the (total minutes 120) visit spent in counseling/coordination of care regarding the above test and medication changes.    Justin Aloisi NP-C  8:40 PM

## 2020-10-23 NOTE — Telephone Encounter (Signed)
Heart Failure Nurse Navigator Progress Note  Left message with spouse regarding HV TOC follow up. Navigator received forward message from [place], NP cards regarding family concern with navigating new CAD dx and medication regimen. Plan to work through issues at [place] appt tomorrow 9/20 @ [place]. Will get pill box to help with new medication schedule. Pt off Adderall per inpt cardiology.   HIPPA appropriate VM left with callback number.    Pricilla Holm, MSN, RN Heart Failure Nurse Navigator [phone]

## 2020-10-23 NOTE — Telephone Encounter (Signed)
Call attempted to confirm HV TOC appt 9/20 @ 9AM. HIPPA appropriate VM left with callback number.   Pricilla Holm, MSN, RN Heart Failure Nurse Navigator [phone]

## 2020-10-24 ENCOUNTER — Encounter (HOSPITAL_COMMUNITY): Payer: Self-pay

## 2020-10-24 ENCOUNTER — Other Ambulatory Visit: Payer: Self-pay

## 2020-10-24 ENCOUNTER — Ambulatory Visit (HOSPITAL_COMMUNITY)
Admission: RE | Admit: 2020-10-24 | Discharge: 2020-10-24 | Disposition: A | Discharge status: Home / Self Care | Payer: Medicare Other | Source: Ambulatory Visit | Attending: Adult Health | Admitting: Adult Health

## 2020-10-24 ENCOUNTER — Other Ambulatory Visit (HOSPITAL_COMMUNITY): Payer: Self-pay

## 2020-10-24 VITALS — BP 110/78 | HR 73 | Wt 177.0 lb

## 2020-10-24 DIAGNOSIS — Z7982 Long term (current) use of aspirin: Secondary | ICD-10-CM | POA: Insufficient documentation

## 2020-10-24 DIAGNOSIS — Z87891 Personal history of nicotine dependence: Secondary | ICD-10-CM | POA: Insufficient documentation

## 2020-10-24 DIAGNOSIS — F419 Anxiety disorder, unspecified: Secondary | ICD-10-CM | POA: Insufficient documentation

## 2020-10-24 DIAGNOSIS — I5022 Chronic systolic (congestive) heart failure: Secondary | ICD-10-CM | POA: Diagnosis present

## 2020-10-24 DIAGNOSIS — R0683 Snoring: Secondary | ICD-10-CM | POA: Diagnosis not present

## 2020-10-24 DIAGNOSIS — I214 Non-ST elevation (NSTEMI) myocardial infarction: Secondary | ICD-10-CM | POA: Diagnosis not present

## 2020-10-24 DIAGNOSIS — F909 Attention-deficit hyperactivity disorder, unspecified type: Secondary | ICD-10-CM | POA: Diagnosis not present

## 2020-10-24 DIAGNOSIS — I2510 Atherosclerotic heart disease of native coronary artery without angina pectoris: Secondary | ICD-10-CM | POA: Insufficient documentation

## 2020-10-24 DIAGNOSIS — Z955 Presence of coronary angioplasty implant and graft: Secondary | ICD-10-CM | POA: Insufficient documentation

## 2020-10-24 DIAGNOSIS — Z7951 Long term (current) use of inhaled steroids: Secondary | ICD-10-CM | POA: Insufficient documentation

## 2020-10-24 DIAGNOSIS — G4719 Other hypersomnia: Secondary | ICD-10-CM

## 2020-10-24 DIAGNOSIS — I252 Old myocardial infarction: Secondary | ICD-10-CM | POA: Diagnosis not present

## 2020-10-24 DIAGNOSIS — Z79899 Other long term (current) drug therapy: Secondary | ICD-10-CM | POA: Diagnosis not present

## 2020-10-24 DIAGNOSIS — N179 Acute kidney failure, unspecified: Secondary | ICD-10-CM

## 2020-10-24 LAB — BASIC METABOLIC PANEL
Anion gap: 9 (ref 5–15)
BUN: 26 mg/dL — ABNORMAL HIGH (ref 8–23)
CO2: 24 mmol/L (ref 22–32)
Calcium: 9 mg/dL (ref 8.9–10.3)
Chloride: 100 mmol/L (ref 98–111)
Creatinine, Ser: 1.57 mg/dL — ABNORMAL HIGH (ref 0.61–1.24)
GFR, Estimated: 48 mL/min — ABNORMAL LOW (ref >60)
Glucose, Bld: 113 mg/dL — ABNORMAL HIGH (ref 70–99)
Potassium: 4.6 mmol/L (ref 3.5–5.1)
Sodium: 133 mmol/L — ABNORMAL LOW (ref 135–145)

## 2020-10-24 LAB — CBC
HCT: 57.7 % — ABNORMAL HIGH (ref 39.0–52.0)
Hemoglobin: 18.6 g/dL — ABNORMAL HIGH (ref 13.0–17.0)
MCH: 28.5 pg (ref 26.0–34.0)
MCHC: 32.2 g/dL (ref 30.0–36.0)
MCV: 88.4 fL (ref 80.0–100.0)
Platelets: 203 10*3/uL (ref 150–400)
RBC: 6.53 MIL/uL — ABNORMAL HIGH (ref 4.22–5.81)
RDW: 17.1 % — ABNORMAL HIGH (ref 11.5–15.5)
WBC: 7.4 10*3/uL (ref 4.0–10.5)
nRBC: 0 % (ref 0.0–0.2)

## 2020-10-24 LAB — BRAIN NATRIURETIC PEPTIDE: B Natriuretic Peptide: 6.7 pg/mL (ref 0.0–100.0)

## 2020-10-24 MED ORDER — LORAZEPAM 0.5 MG PO TABS: 0.5000 mg | ORAL_TABLET | Freq: Three times a day (TID) | ORAL | 0 refills | Status: DC

## 2020-10-24 MED ORDER — LORAZEPAM 0.5 MG PO TABS: 0.5000 mg | ORAL_TABLET | Freq: Every day | ORAL | 0 refills | Status: DC | PRN

## 2020-10-24 MED ORDER — SERTRALINE HCL 25 MG PO TABS: 12.5000 mg | ORAL_TABLET | Freq: Every day | ORAL | 1 refills | Status: DC

## 2020-10-24 MED ORDER — ENTRESTO 24-26 MG PO TABS: 1.0000 | ORAL_TABLET | Freq: Two times a day (BID) | ORAL | 3 refills | Status: DC

## 2020-10-24 NOTE — Patient Instructions (Addendum)
Start Entresto 24/26 mg Twice daily   Start Sertraline 12.5 mg (1/2 tab) Daily at bedtime  We have provided you with a 30-day free card for Entresto and samples of both Brilinta and Wilder Glade  We will submit your patient assistance applications for these medications as well  Your provider has recommended that you have a home sleep study.  We have provided you with the equipment in our office today. Please download the app and follow the instructions. YOUR PIN NUMBER IS: [id number]. Once you have completed the test you just dispose of the equipment, the information is automatically uploaded to [place] via blue-tooth technology. If your test is positive for sleep apnea and you need a home CPAP machine you will be contacted by Dr Theodosia Blender office [facility]) to set this up.  Do the following things EVERYDAY: Weigh yourself in the morning before breakfast. Write it down and keep it in a log. Take your medicines as prescribed Eat low salt foods--Limit salt (sodium) to 2000 mg per day.  Stay as active as you can everyday Limit all fluids for the day to less than 2 liters  Thank you for allowing [place] to provider your heart failure care after your recent hospitalization. Please follow-up with our [facility] 11/08/20 at 1:00 pm Dr Aundra Dubin 12/06/20 at 11:40 am  If you have any questions, issues, or concerns before your next appointment please call our office at [phone], opt. 2 and leave a message for the triage nurse.  At the [facility], you and your health needs are our priority. As part of our continuing mission to provide you with exceptional heart care, we have created designated Provider Care Teams. These Care Teams include your primary Cardiologist (physician) and Advanced Practice Providers (APPs- Physician Assistants and Nurse Practitioners) who all work together to provide you with the care you need, when you need it.   You may see any of the  following providers on your designated Care Team at your next follow up: Dr Glori Bickers Dr Loralie Champagne Dr Patrice Paradise, NP Lyda Jester, [place] Ginnie Smart Audry Riles, PharmD   Please be sure to bring in all your medications bottles to every appointment.

## 2020-10-24 NOTE — Progress Notes (Signed)
ReDS Vest / Clip - 10/24/20 0919       ReDS Vest / Clip   Station Marker C    Ruler Value 28    ReDS Value Range Moderate volume overload    ReDS Actual Value 37

## 2020-10-24 NOTE — Progress Notes (Signed)
Heart and Vascular Care Navigation  10/24/2020  Justin Carroll 12/23/1955 [id number]  Reason for Referral: Patient seen in HF TOC.   Engaged with patient face to face for initial visit for Heart and Vascular Care Coordination.                                                                                                   Assessment:   Patient is a 65 yo married male. 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  Patient and his wife of 12 years have a 9 yo daughter that they are in process of adopting. Patient and wife shared multiple stressors from his own health (stopping Aderall causing irritability) his wife's mother critically ill  and their 34 yo daughter who they are seeking adoption. Wife spoke on behalf of patient and shared his story and frustrations. CSW asked patient his concerns and he validated most concerns shared by his wife. Patient struggling with loss of independence and shared that he enjoyed his work and feeling powerless with not being able to engage in his work due to recent MI. Wife shared that he was told not to work for 3 months.   Patient has an upcoming appointment with Justin Carroll a clinical neuropsychologist  in mid October for further evaluation and therapy. Patient also reported that his PCP of many years passed away suddenly and he has an appointment next week with a new PCP for ongoing needs.                    HRT/VAS Care Coordination     Patients Home Cardiology Office --  [facility]   Outpatient Care Team Social Worker   Social Worker Name: Justin Carroll, Marlinda Mike [phone]   Living arrangements for the past 2 months Single Family Home   Lives with: Minor Children; Spouse   Patient Current Insurance Coverage Traditional Medicare   Patient Has Concern With Paying Medical Bills No   Does Patient Have Prescription Coverage? Yes   Home Assistive Devices/Equipment None       Social History:                                                                              SDOH Screenings   Alcohol Screen: Low Risk    Last Alcohol Screening Score (AUDIT): 0  Depression (PHQ2-9): Low Risk    PHQ-2 Score: 0  Financial Resource Strain: Medium Risk   Difficulty of Paying Living Expenses: Somewhat hard  Food Insecurity: No Food Insecurity   Worried About Charity fundraiser in the Last Year: Never true   Ran Out of Food in the Last Year: Never true  Housing: Low Risk  Last Housing Risk Score: 0  Physical Activity: Not on file  Social Connections: Not on file  Stress: Not on file  Tobacco Use: Medium Risk   Smoking Tobacco Use: Former   Smokeless Tobacco Use: Never  Transportation Needs: No Transportation Needs   Lack of Transportation (Medical): No   Lack of Transportation (Non-Medical): No    SDOH Interventions: Financial Resources:    N/a  Food Insecurity:   N/a  Housing Insecurity:   N/a  Transportation:    N/a    Other Care Navigation Interventions:     Inpatient/Outpatient Substance Abuse Counseling/Rehab Options N/a  Provided Pharmacy assistance resources  Brilinta PAP  Patient expressed Mental Health concerns Yes, Referred to:  Dr. Ilean Carroll Neuropsychologist   Patient Referred to: N/a   Follow-up plan:  CSW provided supportive intervention and encouraged follow up with plan to see Dr Justin Carroll. Patient and wife acknowledged need for ongoing support and therapy and verbalize understanding of plan. CSW available as needed. Justin Carroll, [place], [place]

## 2020-10-24 NOTE — Progress Notes (Signed)
Height: 5'6"    Weight: 177 lb BMI: 27.72  Today's Date: 10/24/20  STOP BANG RISK ASSESSMENT S (snore) Have you been told that you snore?     YES   T (tired) Are you often tired, fatigued, or sleepy during the day?   YES  O (obstruction) Do you stop breathing, choke, or gasp during sleep? NO   P (pressure) Do you have or are you being treated for high blood pressure? NO   B (BMI) Is your body index greater than 35 kg/m? NO   A (age) Are you 67 years old or older? YES   N (neck) Do you have a neck circumference greater than 16 inches?      G (gender) Are you a male? YES   TOTAL STOP/BANG "YES" ANSWERS 4                                                                       For Office Use Only              Procedure Order Form    YES to 3+ Stop Bang questions OR two clinical symptoms - patient qualifies for WatchPAT (CPT 95800)      Clinical Notes: Will consult Sleep Specialist and refer for management of therapy due to patient increased risk of Sleep Apnea. Ordering a sleep study due to the following two clinical symptoms: Excessive daytime sleepiness G47.10 / Loud snoring R06.83

## 2020-10-24 NOTE — Progress Notes (Signed)
Medication Samples have been provided to the patient.  Drug name: Brilinta       Strength: 90mg         Qty: 4  LOT: [id number]  Exp.Date: 05/05/22//09/04/22  Dosing instructions: Take 1 tab Twice daily   The patient has been instructed regarding the correct time, dose, and frequency of taking this medication, including desired effects and most common side effects.   Srija Southard 11:08 AM 10/24/2020   Medication Samples have been provided to the patient.  Drug name: Wilder Glade       Strength: 10 mg        Qty: 2  LOT: [id number]  Exp.Date: 01/04/23  Dosing instructions: Take 1 tab Daily  The patient has been instructed regarding the correct time, dose, and frequency of taking this medication, including desired effects and most common side effects.   Wissam Resor 11:09 AM 10/24/2020

## 2020-10-25 ENCOUNTER — Other Ambulatory Visit (HOSPITAL_COMMUNITY): Payer: Self-pay | Admitting: *Deleted

## 2020-10-26 ENCOUNTER — Telehealth (HOSPITAL_COMMUNITY): Payer: Self-pay | Admitting: Pharmacist

## 2020-10-26 NOTE — Telephone Encounter (Signed)
Sent in [employer] application to [employer] for [place].   Sent in [employer] application to [employer] for [employer].  Applications pending, will continue to follow.   Audry Riles, PharmD, BCPS, BCCP, CPP Heart Failure Clinic Pharmacist [phone]

## 2020-10-27 NOTE — Progress Notes (Signed)
Referring Physician: Dr Gwenlyn Found  Primary Care: Former patient of Dr Zonia Kief [place] PA Primary Cardiologist: Dr Gwenlyn Found  Neurology: Dr Beacher May  HF MD: Dr Aundra Dubin   HPI:  Referred to clinic by Dr Gwenlyn Found for heart failure consultation.    Mr Justin Carroll is a 65 year old with a history of ADHD, depression, mild cognitive impairment, former heavy drinker (has not had alcohol in > 30 years), and recent NSTEMI.    Presented to [facility] 10/18/20 with chest pain. HS trop elevated. Had NSTEMI and underwent cath that showed severe one vessel disease mid left circumflex. He underwent OCT guided DES to mid LCX. Plan dual antiplatelet therapy x 12 months. [name] was added but had AKI so stopped prior to discharge. Prior to admit he was taking 20 mg Adderall everyday with an extra 20 mg 4-5 times a week. Adderall was stopped during hospitalization.     Recently presented for post HF/CAD hospital follow up with his wife on 10/24/20.  Overall was feeling a little tired. Stressed about his recent hospitalization. His wife reported that he has had severe outbursts and mood swings which is completely different from his personality.  His wife says he snores a lot. SOB after walking 8 minutes. He had been walking 8 minutes twice a day. Denied PND/Orthopnea. Appetite was ok. No bleeding issues. No fever or chills. Weight at home was 174-175 pounds. Reported taking all medications.  Currently not working.   Today he returns to HF clinic for pharmacist medication titration. At last visit with APP, Entresto 24/26 mg BID was initiated. Additionally, sertraline 12.5 mg daily was initiated for anxiety. Overall he is feeling well today. No dizziness or lightheadedness. BP at home usually runs 110-130/70-80. BP in clinic 118/70. No CP or palpitations. Has been staying active. Walks ~20 minutes twice daily, used to only be able to walk for 10 minutes twice daily. He will start cardiac rehab in November. Weight has been stable at  home, 170-173 lbs. Does not take any diuretic. No LEE, PND or orthopnea. Completed sleep study and needs CPAP, process ongoing. Notes mood swings have improved with sertraline. Saw neurologist and they recommended Strattera for his ADHD.    HF Medications: Carvedilol 3.125 mg BID Entresto 24/26 mg BID Farxiga 10 mg daily  Has the patient been experiencing any side effects to the medications prescribed?  no  Does the patient have any problems obtaining medications due to transportation or finances?   No insurance. Approved for Entresto PAP through [employer]. Approved for Farxiga and Brilinta PAP through [employer].   Understanding of regimen: good Understanding of indications: good Potential of compliance: good Patient understands to avoid NSAIDs. Patient understands to avoid decongestants.    Pertinent Lab Values: 10/30/20: Serum creatinine 1.38, BUN 22, Potassium 5.3, Sodium 140  Vital Signs: Weight: 174.4 lbs (last clinic weight: 177 lbs) Blood pressure: 118/70  Heart rate: 71   Assessment/Plan: 1. HFrEF, suspect mixed ICM/NICM Echo 35-40%. LHC Single vessel CAD involving mid left circumflex. HF out of proportion to CAD. ?Possible OSA with snoring. Also has been taking Adderall for ADHD which was stopped during his hospitalization.  It is possible this is a mixed picture. For now he needs to stay off Adderall.  - NYHA II. Euvolemic on exam.  - Increase carvedilol to 6.25 mg twice a day.  - Continue Entresto 24-26 mg BID.  - Continue Farxiga 10 mg daily.  - Hold off on spironolactone with recent hyperkalemia. Repeat BMET today pending.  -  Plan to check ECHO in 3 months. May need CMRI if no recovery.    2. CAD  -S/P LHC-Severe one-vessel coronary artery disease involving the mid left circumflex which is likely culprit for non-STEMI with DES placement to mid LCX.  -On statin, aspirin, and Brilinta.   - No chest pain. He has been referred to cardiac rehab, will start in November.     3 H/O AKI -Creatinine peaked during his hospitalization at 1.9. Scr 1.38 on BMET 10/30/20. BMET today pending.    4. ADHD  -Off adderall during recent hospitalization. Concerned his mood swings and out bursts are due to abrupt discontinuation of Adderrall.  -Continue sertraline 12.5 mg daily. Continue wellbutrin.  - He follow up with his Neuro Psychiatrist in October. They have recommended Strattera.    5. Snores - Sleep study returned, he has OSA. Will need CPAP.   Follow up 1 month with Dr. Rush Farmer, PharmD, BCPS, [facility], [facility] Pharmacist [phone]

## 2020-10-28 NOTE — Progress Notes (Signed)
Cardiology Clinic Note   Patient Name: Justin Carroll Date of Encounter: 10/30/2020  Primary Care Provider:  [facility] Primary Cardiologist:  Quay Burow, MD  Patient Profile    Justin Carroll presents to clinic today for follow-up evaluation of his coronary artery disease and chronic systolic CHF.  Past Medical History    Past Medical History:  Diagnosis Date   Depression    Sinus infection    yearly   Past Surgical History:  Procedure Laterality Date   ADENOIDECTOMY     CORONARY STENT INTERVENTION N/A 10/18/2020   Procedure: CORONARY STENT INTERVENTION;  Surgeon: Wellington Hampshire, MD;  Location: [place] [facility];  Service: Cardiovascular;  Laterality: N/A;   INTRAVASCULAR IMAGING/OCT N/A 10/18/2020   Procedure: INTRAVASCULAR IMAGING/OCT;  Surgeon: Wellington Hampshire, MD;  Location: [place] [facility];  Service: Cardiovascular;  Laterality: N/A;   LEFT HEART CATH AND CORONARY ANGIOGRAPHY N/A 10/18/2020   Procedure: LEFT HEART CATH AND CORONARY ANGIOGRAPHY;  Surgeon: Wellington Hampshire, MD;  Location: [place] [facility];  Service: Cardiovascular;  Laterality: N/A;   NASAL SEPTUM SURGERY     SINOSCOPY     TONSILLECTOMY     age 65    Allergies  No Known Allergies  History of Present Illness    Justin Carroll has a PMH of ADHD, depression, mild cognitive impairment, coronary artery disease status post NSTEMI, and chronic systolic CHF with an EF of 35-40%, RV normal on 10/17/2020 echo.  He underwent cardiac catheterization 10/18/2020 which showed a proximal RCA lesion 20%, proximal circumflex-mid circumflex lesion 85%, DES x1 with 0% residual stenosis, normal LVEF.  He was seen by the heart and vascular transition of care clinic on 10/24/2020.  He was started on Entresto 24-26.  With plan for repeat echocardiogram in 3 months.  His Farxiga and carvedilol were continued.  Plan for follow-up in 2 weeks with pharmacy in 6 weeks with Dr. Aundra Dubin.  He presents to  the clinic today for follow-up evaluation states he notices some fatigue.  We reviewed his echocardiogram and angiography.  He was started on Entresto on 10/24/2020.  His wife presents with him and his noted some increased throat clearing.  He reports that he has had allergies for several years and is continuing to take allergy medication medication OTC.  He has contacted many [facility] rehab and was told that he may not be able to start rehab until October.  He has been walking at home 2 times per day and tolerating well.  We reviewed the importance of heart healthy diet and good cholesterol management.  I will order a BMP today, continue his current medication regimen, give a salty 6 diet sheet, have him increase his physical activity as tolerated and follow-up in 3 to 4 months.  Today he denies chest pain, shortness of breath, lower extremity edema, fatigue, palpitations, melena, hematuria, hemoptysis, diaphoresis, weakness, presyncope, syncope, orthopnea, and PND.    Home Medications    Prior to Admission medications   Medication Sig Start Date End Date Taking? Authorizing Provider  aspirin 81 MG EC tablet Take 1 tablet (81 mg total) by mouth daily. Swallow whole. 10/20/20   Donato Heinz, MD  atorvastatin (LIPITOR) 80 MG tablet Take 1 tablet (80 mg total) by mouth daily. 10/20/20   Donato Heinz, MD  Azelastine HCl 137 MCG/SPRAY SOLN USE 2 SPRAY(S) IN EACH NOSTRIL TWICE DAILY AS NEEDED FOR RHINITIS Patient taking differently: Place 2 sprays into  both nostrils 2 (two) times daily as needed (nasal congestion). 09/21/20   Valentina Shaggy, MD  buPROPion (WELLBUTRIN XL) 300 MG 24 hr tablet Take 1 tablet by mouth once daily 08/21/20   Doree Albee, MD  carvedilol (COREG) 3.125 MG tablet Take 1 tablet (3.125 mg total) by mouth 2 (two) times daily with a meal. 10/20/20   Donato Heinz, MD  Cholecalciferol (VITAMIN D-3) 25 MCG (1000 UT) CAPS Take 2 capsules by mouth  daily.    [provider]  dapagliflozin propanediol (FARXIGA) 10 MG TABS tablet Take 1 tablet (10 mg total) by mouth daily. 10/20/20   Donato Heinz, MD  levocetirizine (XYZAL) 5 MG tablet Take 5 mg by mouth every evening.    [provider]  LORazepam (ATIVAN) 0.5 MG tablet Take 1 tablet (0.5 mg total) by mouth daily as needed for anxiety. 10/24/20   Clegg, Amy D, NP  mometasone (NASONEX) 50 MCG/ACT nasal spray Place 1 spray into the nose daily. 12/08/19   Valentina Shaggy, MD  nitroGLYCERIN (NITROSTAT) 0.4 MG SL tablet Place 1 tablet (0.4 mg total) under the tongue every 5 (five) minutes x 3 doses as needed for chest pain. 10/20/20   Donato Heinz, MD  pantoprazole (PROTONIX) 40 MG tablet Take 1 tablet by mouth once daily 08/21/20   Gosrani, Nimish C, MD  sacubitril-valsartan (ENTRESTO) 24-26 MG Take 1 tablet by mouth 2 (two) times daily. 10/24/20   Clegg, Amy D, NP  sertraline (ZOLOFT) 25 MG tablet Take 0.5 tablets (12.5 mg total) by mouth at bedtime. 10/24/20   Clegg, Amy D, NP  tamsulosin (FLOMAX) 0.4 MG CAPS capsule TAKE 1 CAPSULE BY MOUTH ONCE DAILY AFTER SUPPER Patient taking differently: Take 0.4 mg by mouth daily after supper. 08/21/20   Doree Albee, MD  testosterone cypionate (DEPOTESTOSTERONE CYPIONATE) 200 MG/ML injection INJECT 0.5 MLS INTO THE MUSCLE TWICE A WEEK. 02/28/20   Hurshel Party C, MD  ticagrelor (BRILINTA) 90 MG TABS tablet Take 1 tablet (90 mg total) by mouth 2 (two) times daily. 10/20/20   Donato Heinz, MD  traZODone (DESYREL) 100 MG tablet TAKE 1 TABLET BY MOUTH AT BEDTIME 08/21/20   Doree Albee, MD    Family History    Family History  Problem Relation Age of Onset   Allergic rhinitis Brother    Colon cancer Neg Hx    Pancreatic cancer Neg Hx    Rectal cancer Neg Hx    Stomach cancer Neg Hx    He indicated that his mother is deceased. He indicated that his father is deceased. He indicated that his brother  is deceased. He indicated that the status of his neg hx is unknown.  Social History    Social History   Socioeconomic History   Marital status: Married    Spouse name: Presenter, broadcasting   Number of children: 7   Years of education: Not on file   Highest education level: Professional school degree (e.g., MD, DDS, DVM, JD)  Occupational History   Occupation: Lawyer    Comment: self employeed  Tobacco Use   Smoking status: Former    Packs/day: 1.50    Years: 10.00    Pack years: 15.00    Types: Cigarettes    Start date: 1995    Quit date: 2015    Years since quitting: 7.7   Smokeless tobacco: Never  Vaping Use   Vaping Use: Never used  Substance and Sexual Activity  Alcohol use: No   Drug use: No   Sexual activity: Not on file  Other Topics Concern   Not on file  Social History Narrative   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   Social Determinants of Health   Financial Resource Strain: Medium Risk   Difficulty of Paying Living Expenses: Somewhat hard  Food Insecurity: No Food Insecurity   Worried About Charity fundraiser in the Last Year: Never true   Ran Out of Food in the Last Year: Never true  Transportation Needs: No Transportation Needs   Lack of Transportation (Medical): No   Lack of Transportation (Non-Medical): No  Physical Activity: Not on file  Stress: Not on file  Social Connections: Not on file  Intimate Partner Violence: Not on file     Review of Systems    General:  No chills, fever, night sweats or weight changes.  Cardiovascular:  No chest pain, dyspnea on exertion, edema, orthopnea, palpitations, paroxysmal nocturnal dyspnea. Dermatological: No rash, lesions/masses Respiratory: No cough, dyspnea Urologic: No hematuria, dysuria Abdominal:   No nausea, vomiting, diarrhea, bright red blood per rectum, melena, or hematemesis Neurologic:  No visual changes, wkns, changes in mental status. All other systems reviewed and are otherwise negative except as noted  above.  Physical Exam    VS:  BP 108/76   Pulse 68   Ht 5\' 7"  (1.702 m)   Wt 176 lb 3.2 oz (79.9 kg)   SpO2 96%   BMI 27.60 kg/m  , BMI Body mass index is 27.6 kg/m. GEN: Well nourished, well developed, in no acute distress. HEENT: normal. Neck: Supple, no JVD, carotid bruits, or masses. Cardiac: RRR, no murmurs, rubs, or gallops. No clubbing, cyanosis, edema.  Radials/DP/PT 2+ and equal bilaterally.  Respiratory:  Respirations regular and unlabored, clear to auscultation bilaterally. GI: Soft, nontender, nondistended, BS + x 4. MS: no deformity or atrophy. Skin: warm and dry, no rash.  Right radial cath site clean dry intact no drainage. Neuro:  Strength and sensation are intact. Psych: Normal affect.  Accessory Clinical Findings    Recent Labs: 10/17/2020: ALT 26 10/18/2020: TSH 0.592 10/24/2020: B Natriuretic Peptide 6.7; BUN 26; Creatinine, Ser 1.57; Hemoglobin 18.6; Platelets 203; Potassium 4.6; Sodium 133   Recent Lipid Panel    Component Value Date/Time   CHOL 191 10/18/2020 0233   TRIG 120 10/18/2020 0233   HDL 61 10/18/2020 0233   CHOLHDL 3.1 10/18/2020 0233   VLDL 24 10/18/2020 0233   LDLCALC 106 (H) 10/18/2020 0233    ECG personally reviewed by me today-normal sinus rhythm 68 bpm no ST or T wave deviation- No acute changes  Echocardiogram 10/18/2020 IMPRESSIONS     1. Left ventricular ejection fraction, by estimation, is 35 to 40%. The  left ventricle has moderately decreased function. The left ventricle  demonstrates global hypokinesis. There is mild concentric left ventricular  hypertrophy. Left ventricular  diastolic parameters are consistent with Grade I diastolic dysfunction  (impaired relaxation).   2. Right ventricular systolic function is normal. The right ventricular  size is normal.   3. Right atrial size was mildly dilated.   4. The mitral valve is normal in structure. Mild mitral valve  regurgitation. No evidence of mitral stenosis.   5.  The aortic valve is tricuspid. Aortic valve regurgitation is not  visualized. No aortic stenosis is present.   6. Aortic dilatation noted. There is mild dilatation of the aortic root,  measuring 40 mm. There is borderline dilatation of  the ascending aorta,  measuring 36 mm.   7. The inferior vena cava is normal in size with greater than 50%  respiratory variability, suggesting right atrial pressure of 3 mmHg.  Cardiac catheterization 02/18/2020   Prox RCA lesion is 20% stenosed.   Prox Cx to Mid Cx lesion is 85% stenosed.   A drug-eluting stent was successfully placed using a STENT ONYX FRONTIER 3.5X15.   Post intervention, there is a 0% residual stenosis.   The left ventricular systolic function is normal.   LV end diastolic pressure is normal.   The left ventricular ejection fraction is 55-65% by visual estimate.   1.  Severe one-vessel coronary artery disease involving the mid left circumflex which is likely culprit for non-STEMI. 2. Successful OCT guided DES placement to mid LCX.    Recommendations:  Dual antiplatelet therapy for at least 12 months. Aggressive treatment of risk factors.  Diagnostic Dominance: Right Intervention    Assessment & Plan   1.  NSTEMI-underwent cardiac catheterization on 10/18/2020.  Was noted to have 20% proximal RCA stenosis, proximal-mid circumflex stenosis 85% and received DES x1. Continue aspirin, atorvastatin, carvedilol, Brilinta Heart healthy low-sodium diet-salty 6 given Increase physical activity as tolerated  Hyperlipidemia-10/18/2020: Cholesterol 191; HDL 61; LDL Cholesterol 106; Triglycerides 120; VLDL 24 Continue aspirin, atorvastatin Heart healthy low-sodium diet-salty 6 given Increase physical activity as tolerated Repeat fasting lipids and LFTs in 4 to 6 weeks.  HFrEF-no increased DOE or activity intolerance.  Reports increased activity tolerance with addition of Entresto therapy.  Follow-up BMP showed improved creatinine of 1.57  and BNP of 6.7.  Plan made for repeat echocardiogram in 3 months. Continue carvedilol, Lisabeth Register Heart healthy low-sodium diet-salty 6 given Increase physical activity as tolerated  Essential hypertension-BP today108/76.  Well-controlled at home. Continue carvedilol.   Heart healthy low-sodium diet-salty 6 given Increase physical activity as tolerated Maintain blood pressure log  AKI-follow-up creatinine 1.59 with previous creatinine of 1.90 during hospitalization. Follows with PCP  Disposition: Follow-up with Dr. Gwenlyn Found in 3-4 months  Justin Ng. Olivette Beckmann NP-C    10/30/2020, 11:54 AM [address] [place] [address] Office [phone] Fax [phone]  Notice: This dictation was prepared with Dragon dictation along with smaller phrase technology. Any transcriptional errors that result from this process are unintentional and may not be corrected upon review.  I spent 14 minutes examining this patient, reviewing medications, and using patient centered shared decision making involving her cardiac care.  Prior to her visit I spent greater than 20 minutes reviewing her past medical history,  medications, and prior cardiac tests.

## 2020-10-30 ENCOUNTER — Ambulatory Visit (INDEPENDENT_AMBULATORY_CARE_PROVIDER_SITE_OTHER): Payer: Medicare Other | Admitting: General Practice

## 2020-10-30 ENCOUNTER — Other Ambulatory Visit: Payer: Self-pay

## 2020-10-30 ENCOUNTER — Encounter: Payer: Self-pay | Admitting: General Practice

## 2020-10-30 VITALS — BP 108/76 | HR 68 | Ht 67.0 in | Wt 176.2 lb

## 2020-10-30 DIAGNOSIS — I214 Non-ST elevation (NSTEMI) myocardial infarction: Secondary | ICD-10-CM | POA: Diagnosis not present

## 2020-10-30 DIAGNOSIS — N179 Acute kidney failure, unspecified: Secondary | ICD-10-CM

## 2020-10-30 DIAGNOSIS — I5022 Chronic systolic (congestive) heart failure: Secondary | ICD-10-CM

## 2020-10-30 DIAGNOSIS — I10 Essential (primary) hypertension: Secondary | ICD-10-CM

## 2020-10-30 DIAGNOSIS — E7800 Pure hypercholesterolemia, unspecified: Secondary | ICD-10-CM | POA: Diagnosis not present

## 2020-10-30 NOTE — Patient Instructions (Addendum)
Medication Instructions:  The current medical regimen is effective;  continue present plan and medications as directed. Please refer to the Current Medication list given to you today.  *If you need a refill on your cardiac medications before your next appointment, please call your pharmacy*  Lab Work: BMET TODAY AND  FASTING IN 4-6 WEEKS LIPID AND LFT's  If you have labs (blood work) drawn today and your tests are completely normal, you will receive your results only by:  [place] (if you have MyChart) OR A paper copy in the mail.  If you have any lab test that is abnormal or we need to change your treatment, we will call you to review the results. You may go to any [facility] that is convenient for you however, we do have a lab in our office that is able to assist you. You DO NOT need an appointment for our lab. The lab is open 8:00am and closes at 4:00pm. Lunch 12:45 - 1:45pm.  Special Instructions TAKE AND LOG YOUR BLOOD PRESSURE  PLEASE READ AND FOLLOW SALTY 6-ATTACHED-1,800 mg daily  Follow-Up: Your next appointment:  3-4 month(s) In Person with Quay Burow, MD   At [facility], you and your health needs are our priority.  As part of our continuing mission to provide you with exceptional heart care, we have created designated Provider Care Teams.  These Care Teams include your primary Cardiologist (physician) and Advanced Practice Providers (APPs -  Physician Assistants and Nurse Practitioners) who all work together to provide you with the care you need, when you need it.  We recommend signing up for the patient portal called "MyChart".  Sign up information is provided on this After Visit Summary.  MyChart is used to connect with patients for Virtual Visits (Telemedicine).  Patients are able to view lab/test results, encounter notes, upcoming appointments, etc.  Non-urgent messages can be sent to your provider as well.   To learn more about what you can do with MyChart, go to  [email or web address].

## 2020-10-31 ENCOUNTER — Other Ambulatory Visit: Payer: Self-pay

## 2020-10-31 DIAGNOSIS — Z79899 Other long term (current) drug therapy: Secondary | ICD-10-CM

## 2020-10-31 DIAGNOSIS — N179 Acute kidney failure, unspecified: Secondary | ICD-10-CM

## 2020-10-31 LAB — BASIC METABOLIC PANEL
BUN/Creatinine Ratio: 16 (ref 10–24)
BUN: 22 mg/dL (ref 8–27)
CO2: 21 mmol/L (ref 20–29)
Calcium: 9.2 mg/dL (ref 8.6–10.2)
Chloride: 102 mmol/L (ref 96–106)
Creatinine, Ser: 1.38 mg/dL — ABNORMAL HIGH (ref 0.76–1.27)
Glucose: 79 mg/dL (ref 70–99)
Potassium: 5.3 mmol/L — ABNORMAL HIGH (ref 3.5–5.2)
Sodium: 140 mmol/L (ref 134–144)
eGFR: 56 mL/min/{1.73_m2} — ABNORMAL LOW (ref >59)

## 2020-11-01 NOTE — Telephone Encounter (Signed)
Pt was seen 10/30/20 by Coletta Memos NP .Adonis Housekeeper

## 2020-11-02 NOTE — Telephone Encounter (Signed)
Advanced Heart Failure Patient Advocate Encounter  Called [employer] to check the status of the patient's application. Representative stated that the application was not found, despite fax confirmation. Will send [place] and Brilinta app again.   Advanced Heart Failure Patient Advocate Encounter   Patient was approved to receive Entresto from [employer]  Patient ID: [id number] Effective dates: 11/01/20 through 02/03/21

## 2020-11-03 ENCOUNTER — Encounter (INDEPENDENT_AMBULATORY_CARE_PROVIDER_SITE_OTHER): Payer: Medicare Other | Admitting: Cardiology

## 2020-11-03 DIAGNOSIS — G4733 Obstructive sleep apnea (adult) (pediatric): Secondary | ICD-10-CM

## 2020-11-06 ENCOUNTER — Ambulatory Visit: Payer: Medicare Other

## 2020-11-06 ENCOUNTER — Other Ambulatory Visit: Payer: Self-pay | Admitting: Neurology

## 2020-11-06 DIAGNOSIS — I214 Non-ST elevation (NSTEMI) myocardial infarction: Secondary | ICD-10-CM

## 2020-11-06 DIAGNOSIS — G4719 Other hypersomnia: Secondary | ICD-10-CM

## 2020-11-06 DIAGNOSIS — R0683 Snoring: Secondary | ICD-10-CM

## 2020-11-06 NOTE — Procedures (Signed)
Sleep Study Report  Patient Information Study Date: 11/03/20 Patient Name: Linville Decarolis Patient ID: [id number] Birth Date: Jan 26, 2056 Age: 65 Gender: Male Referring Physician: Loralie Champagne, MD  TEST DESCRIPTION: Home sleep apnea testing was completed using the WatchPat, a Type 1 device, utilizing  peripheral arterial tonometry (PAT), chest movement, actigraphy, pulse oximetry, pulse rate, body position and snore.  AHI was calculated with apnea and hypopnea using valid sleep time as the denominator. RDI includes apneas,  hypopneas, and RERAs. The data acquired and the scoring of sleep and all associated events were performed in  accordance with the recommended standards and specifications as outlined in the AASM Manual for the Scoring of  Sleep and Associated Events 2.2.0 (2015).   FINDINGS:  1. Mild Obstructive Sleep Apnea with AHI 11/hr.  2. Minimal Central Sleep Apnea with pAHIc 5.6/hr.  3. Oxygen desaturations as low as 87%.  4. Moderate snoring was present. O2 sats were < 88% for 1.2 min.  5. Total sleep time was 5 hrs and 11 min.  6. 27.8% of total sleep time was spent in REM sleep.  7. Normal sleep onset latency at 24 min.  8. Prolonged REM sleep onset latency at 154 min.  9. Total awakenings were 12.   DIAGNOSIS: Mild Obstructive Sleep Apnea (G47.33)  RECOMMENDATIONS: 1. Clinical correlation of these findings is necessary. The decision to treat obstructive sleep apnea (OSA) is usually  based on the presence of apnea symptoms or the presence of associated medical conditions such as Hypertension,  Congestive Heart Failure, Atrial Fibrillation or Obesity. The most common symptoms of OSA are snoring, gasping for  breath while sleeping, daytime sleepiness and fatigue.   2. Initiating apnea therapy is recommended given the presence of symptoms and/or associated conditions.  Recommend proceeding with one of the following:   a. Auto-CPAP therapy with a pressure range of  5-20cm H2O.   b. An oral appliance (OA) that can be obtained from certain dentists with expertise in sleep medicine. These are  primarily of use in non-obese patients with mild and moderate disease.   c. An ENT consultation which may be useful to look for specific causes of obstruction and possible treatment  options.   d. If patient is intolerant to PAP therapy, consider referral to ENT for evaluation for hypoglossal nerve stimulator.   3. Close follow-up is necessary to ensure success with CPAP or oral appliance therapy for maximum benefit .  4. A follow-up oximetry study on CPAP is recommended to assess the adequacy of therapy and determine the need  for supplemental oxygen or the potential need for Bi-level therapy. An arterial blood gas to determine the adequacy of  baseline ventilation and oxygenation should also be considered.  5. Healthy sleep recommendations include: adequate nightly sleep (normal 7-9 hrs/night), avoidance of caffeine after  noon and alcohol near bedtime, and maintaining a sleep environment that is cool, dark and quiet.  6. Weight loss for overweight patients is recommended. Even modest amounts of weight loss can significantly  improve the severity of sleep apnea.  7. Snoring recommendations include: weight loss where appropriate, side sleeping, and avoidance of alcohol before  bed.  8. Operation of motor vehicle should be avoided when sleepy.  Signature: Electronically Signed: 11/06/20 Face-to-face demonstration or video or telephone instructions of the recorder's application and use were provided to the  patient . A 24-hour support line was available to the patient during testing. Fransico Him, MD; [facility]; [place], [place] Board  of  Sleep Medicine Report prepared by: Sherre Scarlet

## 2020-11-07 ENCOUNTER — Telehealth: Payer: Self-pay | Admitting: *Deleted

## 2020-11-07 DIAGNOSIS — G4733 Obstructive sleep apnea (adult) (pediatric): Secondary | ICD-10-CM

## 2020-11-07 NOTE — Telephone Encounter (Signed)
The patient has been notified of the result and verbalized understanding.  All questions (if any) were answered. Justin Carroll, [name] 11/07/2020 3:24 PM    Patient does not think he can tolerate a cpap and wants to speak to dr Aundra Dubin at his 12/06/20 appt. before agreeing to wear the cpap.

## 2020-11-07 NOTE — Telephone Encounter (Signed)
-----   Message from Sueanne Margarita, MD sent at 11/06/2020  1:26 PM EDT ----- Please let patient know that they have sleep apnea and recommend treating with CPAP.  Please order an auto CPAP from 4-15cm H2O with heated humidity and mask of choice.  Order overnight pulse ox on CPAP.  Followup with me in 6 weeks.

## 2020-11-08 ENCOUNTER — Ambulatory Visit (HOSPITAL_COMMUNITY)
Admission: RE | Admit: 2020-11-08 | Discharge: 2020-11-08 | Disposition: A | Discharge status: Home / Self Care | Payer: Medicare Other | Source: Ambulatory Visit | Attending: Cardiology | Admitting: Cardiology

## 2020-11-08 ENCOUNTER — Other Ambulatory Visit: Payer: Self-pay

## 2020-11-08 VITALS — BP 118/70 | HR 71 | Wt 174.4 lb

## 2020-11-08 DIAGNOSIS — I5022 Chronic systolic (congestive) heart failure: Secondary | ICD-10-CM | POA: Diagnosis present

## 2020-11-08 DIAGNOSIS — F909 Attention-deficit hyperactivity disorder, unspecified type: Secondary | ICD-10-CM | POA: Insufficient documentation

## 2020-11-08 DIAGNOSIS — F419 Anxiety disorder, unspecified: Secondary | ICD-10-CM | POA: Insufficient documentation

## 2020-11-08 DIAGNOSIS — Z79899 Other long term (current) drug therapy: Secondary | ICD-10-CM | POA: Diagnosis not present

## 2020-11-08 DIAGNOSIS — R0683 Snoring: Secondary | ICD-10-CM | POA: Insufficient documentation

## 2020-11-08 DIAGNOSIS — I252 Old myocardial infarction: Secondary | ICD-10-CM | POA: Diagnosis not present

## 2020-11-08 DIAGNOSIS — Z7901 Long term (current) use of anticoagulants: Secondary | ICD-10-CM | POA: Diagnosis not present

## 2020-11-08 DIAGNOSIS — I2510 Atherosclerotic heart disease of native coronary artery without angina pectoris: Secondary | ICD-10-CM | POA: Diagnosis not present

## 2020-11-08 LAB — BASIC METABOLIC PANEL
Anion gap: 9 (ref 5–15)
BUN: 17 mg/dL (ref 8–23)
CO2: 23 mmol/L (ref 22–32)
Calcium: 9.1 mg/dL (ref 8.9–10.3)
Chloride: 104 mmol/L (ref 98–111)
Creatinine, Ser: 1.28 mg/dL — ABNORMAL HIGH (ref 0.61–1.24)
GFR, Estimated: >60 mL/min (ref >60)
Glucose, Bld: 100 mg/dL — ABNORMAL HIGH (ref 70–99)
Potassium: 4.5 mmol/L (ref 3.5–5.1)
Sodium: 136 mmol/L (ref 135–145)

## 2020-11-08 MED ORDER — CARVEDILOL 6.25 MG PO TABS: 6.2500 mg | ORAL_TABLET | Freq: Two times a day (BID) | ORAL | 11 refills | Status: DC

## 2020-11-08 NOTE — Telephone Encounter (Signed)
Advanced Heart Failure Patient Advocate Encounter   Patient was approved to receive Wilder Glade and Brilinta from [employer]  Patient ID: [id number] Effective dates: 11/02/20 through 02/03/21  Both medications are being mailed out today. The patient can expect a delivery in 7-10 business days.   Charlann Boxer, CPhT

## 2020-11-08 NOTE — Patient Instructions (Addendum)
It was a pleasure seeing you today!  MEDICATIONS: -We are changing your medications today -Increase carvedilol to 6.25 mg (1 tablet) twice daily. You may take 2 tablets of the 3.125 mg strength twice daily until you pick up the new strength.  -Call if you have questions about your medications.  LABS: -We will call you if your labs need attention.  NEXT APPOINTMENT: Return to clinic in 4 weeks with Dr. Aundra Dubin.  In general, to take care of your heart failure: -Limit your fluid intake to 2 Liters (half-gallon) per day.   -Limit your salt intake to ideally 2-3 grams (2000-3000 mg) per day. -Weigh yourself daily and record, and bring that "weight diary" to your next appointment.  (Weight gain of 2-3 pounds in 1 day typically means fluid weight.) -The medications for your heart are to help your heart and help you live longer.   -Please contact us before stopping any of your heart medications.  Call the clinic at [phone] with questions or to reschedule future appointments.

## 2020-11-10 ENCOUNTER — Other Ambulatory Visit: Payer: Self-pay

## 2020-11-10 ENCOUNTER — Encounter (HOSPITAL_COMMUNITY): Payer: Self-pay

## 2020-11-10 ENCOUNTER — Encounter (HOSPITAL_COMMUNITY)
Admission: RE | Admit: 2020-11-10 | Discharge: 2020-11-10 | Disposition: A | Discharge status: Home / Self Care | Payer: Medicare Other | Source: Ambulatory Visit | Attending: Cardiovascular Disease | Admitting: Cardiovascular Disease

## 2020-11-10 VITALS — BP 104/56 | HR 76 | Ht 67.0 in | Wt 174.8 lb

## 2020-11-10 DIAGNOSIS — Z955 Presence of coronary angioplasty implant and graft: Secondary | ICD-10-CM | POA: Insufficient documentation

## 2020-11-10 DIAGNOSIS — I214 Non-ST elevation (NSTEMI) myocardial infarction: Secondary | ICD-10-CM | POA: Insufficient documentation

## 2020-11-10 NOTE — Progress Notes (Signed)
Cardiac Individual Treatment Plan  Patient Details  Name: Justin Carroll MRN: [id number] Date of Birth: 25-Nov-1955 Referring Provider:   Flowsheet Row CARDIAC REHAB PHASE II ORIENTATION from 11/10/2020 in [place]  Referring Provider Dr. Gardiner Rhyme       Initial Encounter Date:  Flowsheet Row CARDIAC REHAB PHASE II ORIENTATION from 11/10/2020 in [place]  Date 11/10/20       Visit Diagnosis: NSTEMI (non-ST elevated myocardial infarction) [facility])  Status post coronary artery stent placement  Patient's Home Medications on Admission:  Current Outpatient Medications:    aspirin 81 MG EC tablet, Take 1 tablet (81 mg total) by mouth daily. Swallow whole., Disp: 90 tablet, Rfl: 3   atorvastatin (LIPITOR) 80 MG tablet, Take 1 tablet (80 mg total) by mouth daily., Disp: 90 tablet, Rfl: 3   Azelastine HCl 137 MCG/SPRAY SOLN, USE 2 SPRAY(S) IN EACH NOSTRIL TWICE DAILY AS NEEDED FOR RHINITIS (Patient taking differently: Place 2 sprays into both nostrils 2 (two) times daily as needed (nasal congestion).), Disp: 30 mL, Rfl: 2   buPROPion (WELLBUTRIN XL) 300 MG 24 hr tablet, Take 1 tablet by mouth once daily, Disp: 90 tablet, Rfl: 0   carvedilol (COREG) 6.25 MG tablet, Take 1 tablet (6.25 mg total) by mouth 2 (two) times daily with a meal., Disp: 60 tablet, Rfl: 11   Cholecalciferol (VITAMIN D) 50 MCG (2000 UT) tablet, Take 2,000 Units by mouth daily., Disp: , Rfl:    dapagliflozin propanediol (FARXIGA) 10 MG TABS tablet, Take 1 tablet (10 mg total) by mouth daily., Disp: 30 tablet, Rfl: 11   levocetirizine (XYZAL) 5 MG tablet, Take 5 mg by mouth every evening., Disp: , Rfl:    LORazepam (ATIVAN) 0.5 MG tablet, Take 1 tablet (0.5 mg total) by mouth daily as needed for anxiety., Disp: 7 tablet, Rfl: 0   mometasone (NASONEX) 50 MCG/ACT nasal spray, Place 1 spray into the nose daily., Disp: 17 g, Rfl: 5   nitroGLYCERIN (NITROSTAT) 0.4 MG SL tablet, Place 1  tablet (0.4 mg total) under the tongue every 5 (five) minutes x 3 doses as needed for chest pain., Disp: 25 tablet, Rfl: 1   pantoprazole (PROTONIX) 40 MG tablet, Take 1 tablet by mouth once daily, Disp: 90 tablet, Rfl: 0   sacubitril-valsartan (ENTRESTO) 24-26 MG, Take 1 tablet by mouth 2 (two) times daily., Disp: 60 tablet, Rfl: 3   sertraline (ZOLOFT) 25 MG tablet, Take 0.5 tablets (12.5 mg total) by mouth at bedtime., Disp: 15 tablet, Rfl: 1   tamsulosin (FLOMAX) 0.4 MG CAPS capsule, TAKE 1 CAPSULE BY MOUTH ONCE DAILY AFTER SUPPER (Patient taking differently: Take 0.4 mg by mouth daily after supper.), Disp: 90 capsule, Rfl: 0   testosterone cypionate (DEPOTESTOSTERONE CYPIONATE) 200 MG/ML injection, INJECT 0.5 MLS INTO THE MUSCLE TWICE A WEEK. (Patient taking differently: Inject 100 mg into the muscle 2 (two) times a week.), Disp: 10 mL, Rfl: 2   ticagrelor (BRILINTA) 90 MG TABS tablet, Take 1 tablet (90 mg total) by mouth 2 (two) times daily., Disp: 60 tablet, Rfl: 11   traZODone (DESYREL) 100 MG tablet, TAKE 1 TABLET BY MOUTH AT BEDTIME, Disp: 90 tablet, Rfl: 0   Cholecalciferol (VITAMIN D-3 PO), Take 2,000 Units by mouth daily., Disp: , Rfl:   Past Medical History: Past Medical History:  Diagnosis Date   Depression    Sinus infection    yearly    Tobacco Use: Social History   Tobacco Use  Smoking Status Former   Packs/day: 1.50   Years: 10.00   Pack years: 15.00   Types: Cigarettes   Start date: 1995   Quit date: 2015   Years since quitting: 7.7  Smokeless Tobacco Never    Labs: Recent Review Management consultant for [facility] Latest Ref Rng & Units 10/18/2020   Cholestrol 0 - 200 mg/dL 191   LDLCALC 0 - 99 mg/dL 106(H)   HDL >40 mg/dL 61   Trlycerides <150 mg/dL 120   Hemoglobin A1c 4.8 - 5.6 % 6.3(H)       Capillary Blood Glucose: Lab Results  Component Value Date   GLUCAP 127 (H) 10/20/2020   GLUCAP 90 10/20/2020   GLUCAP 104 (H)  10/19/2020   GLUCAP 84 10/19/2020   GLUCAP 124 (H) 10/19/2020     Exercise Target Goals: Exercise Program Goal: Individual exercise prescription set using results from initial 6 min walk test and THRR while considering  patient's activity barriers and safety.   Exercise Prescription Goal: Starting with aerobic activity 30 plus minutes a day, 3 days per week for initial exercise prescription. Provide home exercise prescription and guidelines that participant acknowledges understanding prior to discharge.  Activity Barriers & Risk Stratification:  Activity Barriers & Cardiac Risk Stratification - 11/10/20 1307       Activity Barriers & Cardiac Risk Stratification   Cardiac Risk Stratification High             6 Minute Walk:  6 Minute Walk     Row Name 11/10/20 1402         6 Minute Walk   Phase Initial     Distance 1500 feet     Walk Time 6 minutes     # of Rest Breaks 0     MPH 2.8     METS 3.17     RPE 12     VO2 Peak 11.12     Symptoms No     Resting HR 76 bpm     Resting BP 104/56     Resting Oxygen Saturation  96 %     Exercise Oxygen Saturation  during 6 min walk 95 %     Max Ex. HR 82 bpm     Max Ex. BP 120/60     2 Minute Post BP 100/58              Oxygen Initial Assessment:   Oxygen Re-Evaluation:   Oxygen Discharge (Final Oxygen Re-Evaluation):   Initial Exercise Prescription:  Initial Exercise Prescription - 11/10/20 1400       Date of Initial Exercise RX and Referring Provider   Date 11/10/20    Referring Provider Dr. Gardiner Rhyme    Expected Discharge Date 02/02/21      Treadmill   MPH 2.3    Grade 0    Minutes 22    METs 0      Recumbant Elliptical   Level 1    RPM 60    Minutes 17    METs 0      Prescription Details   Frequency (times per week) 3    Duration Progress to 30 minutes of continuous aerobic without signs/symptoms of physical distress      Intensity   THRR 40-80% of Max Heartrate 61-122    Ratings of  Perceived Exertion 11-15    Perceived Dyspnea 0-4      Progression   Progression Continue  to progress workloads to maintain intensity without signs/symptoms of physical distress.      Resistance Training   Training Prescription Yes    Weight 4    Reps 10-15             Perform Capillary Blood Glucose checks as needed.  Exercise Prescription Changes:   Exercise Comments:   Exercise Goals and Review:   Exercise Goals     Row Name 11/10/20 1407             Exercise Goals   Increase Physical Activity Yes       Intervention Provide advice, education, support and counseling about physical activity/exercise needs.;Develop an individualized exercise prescription for aerobic and resistive training based on initial evaluation findings, risk stratification, comorbidities and participant's personal goals.       Expected Outcomes Short Term: Attend rehab on a regular basis to increase amount of physical activity.;Long Term: Add in home exercise to make exercise part of routine and to increase amount of physical activity.;Long Term: Exercising regularly at least 3-5 days a week.       Increase Strength and Stamina Yes       Intervention Provide advice, education, support and counseling about physical activity/exercise needs.;Develop an individualized exercise prescription for aerobic and resistive training based on initial evaluation findings, risk stratification, comorbidities and participant's personal goals.       Expected Outcomes Short Term: Increase workloads from initial exercise prescription for resistance, speed, and METs.;Short Term: Perform resistance training exercises routinely during rehab and add in resistance training at home;Long Term: Improve cardiorespiratory fitness, muscular endurance and strength as measured by increased METs and functional capacity (6MWT)       Able to understand and use rate of perceived exertion (RPE) scale Yes       Intervention Provide  education and explanation on how to use RPE scale       Expected Outcomes Short Term: Able to use RPE daily in rehab to express subjective intensity level;Long Term:  Able to use RPE to guide intensity level when exercising independently       Knowledge and understanding of Target Heart Rate Range (THRR) Yes       Intervention Provide education and explanation of THRR including how the numbers were predicted and where they are located for reference       Expected Outcomes Short Term: Able to state/look up THRR;Short Term: Able to use daily as guideline for intensity in rehab;Long Term: Able to use THRR to govern intensity when exercising independently       Able to check pulse independently Yes       Intervention Review the importance of being able to check your own pulse for safety during independent exercise;Provide education and demonstration on how to check pulse in carotid and radial arteries.       Expected Outcomes Short Term: Able to explain why pulse checking is important during independent exercise;Long Term: Able to check pulse independently and accurately       Understanding of Exercise Prescription Yes       Intervention Provide education, explanation, and written materials on patient's individual exercise prescription       Expected Outcomes Short Term: Able to explain program exercise prescription;Long Term: Able to explain home exercise prescription to exercise independently                Exercise Goals Re-Evaluation :    Discharge Exercise Prescription (Final Exercise Prescription Changes):  Nutrition:  Target Goals: Understanding of nutrition guidelines, daily intake of sodium 1500mg , cholesterol 200mg , calories 30% from fat and 7% or less from saturated fats, daily to have 5 or more servings of fruits and vegetables.  Biometrics:  Pre Biometrics - 11/10/20 1408       Pre Biometrics   Height 5\' 7"  (1.702 m)    Weight 174 lb 13.2 oz (79.3 kg)    Waist  Circumference 41 inches    Hip Circumference 39 inches    Waist to Hip Ratio 1.05 %    BMI (Calculated) 27.37    Triceps Skinfold 8 mm    % Body Fat 25.5 %    Grip Strength 28.5 kg    Flexibility 4.5 in    Single Leg Stand 19 seconds              Nutrition Therapy Plan and Nutrition Goals:   Nutrition Assessments:  Nutrition Assessments - 11/10/20 1245       MEDFICTS Scores   Pre Score 20            MEDIFICTS Score Key: ?70 Need to make dietary changes  40-70 Heart Healthy Diet ? 40 Therapeutic Level Cholesterol Diet   Picture Your Plate Scores: <74 Unhealthy dietary pattern with much room for improvement. 41-50 Dietary pattern unlikely to meet recommendations for good health and room for improvement. 51-60 More healthful dietary pattern, with some room for improvement.  >60 Healthy dietary pattern, although there may be some specific behaviors that could be improved.    Nutrition Goals Re-Evaluation:   Nutrition Goals Discharge (Final Nutrition Goals Re-Evaluation):   Psychosocial: Target Goals: Acknowledge presence or absence of significant depression and/or stress, maximize coping skills, provide positive support system. Participant is able to verbalize types and ability to use techniques and skills needed for reducing stress and depression.  Initial Review & Psychosocial Screening:  Initial Psych Review & Screening - 11/10/20 1309       Initial Review   Current issues with Current Sleep Concerns;Current Depression;Current Anxiety/Panic   Takes zoloft for depression and ativan for anxiety. His sleep is altered but he was recently diagnosed with OSA and is waiting on his CPAP to arrive.     Family Dynamics   Good Support System? Yes    Comments His support system is his wife.      Barriers   Psychosocial barriers to participate in program The patient should benefit from training in stress management and relaxation.      Screening Interventions    Interventions Encouraged to exercise;To provide support and resources with identified psychosocial needs;Provide feedback about the scores to participant    Expected Outcomes Long Term Goal: Stressors or current issues are controlled or eliminated.;Short Term goal: Identification and review with participant of any Quality of Life or Depression concerns found by scoring the questionnaire.;Long Term goal: The participant improves quality of Life and PHQ9 Scores as seen by post scores and/or verbalization of changes             Quality of Life Scores:  Quality of Life - 11/10/20 1409       Quality of Life   Select Quality of Life      Quality of Life Scores   Health/Function Pre 15.6 %    Socioeconomic Pre 20.5 %    Psych/Spiritual Pre 21.43 %    Family Pre 22.8 %    GLOBAL Pre 18.87 %  Scores of 19 and below usually indicate a poorer quality of life in these areas.  A difference of  2-3 points is a clinically meaningful difference.  A difference of 2-3 points in the total score of the Quality of Life Index has been associated with significant improvement in overall quality of life, self-image, physical symptoms, and general health in studies assessing change in quality of life.  PHQ-9: Recent Review Flowsheet Data     Depression screen [facility] 2/9 11/10/2020 05/17/2020   Decreased Interest 1 0   Down, Depressed, Hopeless 0 0   PHQ - 2 Score 1 0   Altered sleeping 1 0   Tired, decreased energy 2 0   Change in appetite 1 0   Feeling bad or failure about yourself  0 0   Trouble concentrating 1 0   Moving slowly or fidgety/restless 0 0   Suicidal thoughts 0 0   PHQ-9 Score 6 0   Difficult doing work/chores Very difficult Not difficult at all      Interpretation of Total Score  Total Score Depression Severity:  1-4 = Minimal depression, 5-9 = Mild depression, 10-14 = Moderate depression, 15-19 = Moderately severe depression, 20-27 = Severe depression   Psychosocial  Evaluation and Intervention:  Psychosocial Evaluation - 11/10/20 1330       Psychosocial Evaluation & Interventions   Interventions Stress management education;Relaxation education;Encouraged to exercise with the program and follow exercise prescription    Comments Pt has no barriers to completing cardiac rehab. He does have anxiety and depression. His anxiety is treated with ativan and his depression is treated with zoloft. He reports being unhappy lately due to his recent NSTEMI. 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 This is all very different to him since he is out of work for now. He also reports problems with his sleep, but he was recently diagnosed with OSA. He has ordered a CPAP and is waiting for it to come in. He reports that he has a good support system from his wife. He reports that he has made several lifestyle changes since his NSTEMI such as cleaning up his diet. His goals while in the program are to gain his strength and stamina back. He is eager to start rehab and to use his time off of work to improve his health.    Expected Outcomes The patient will continue to take his medications for depression and anxiety and will have no further identifiable psychosocial issues.    Continue Psychosocial Services  No Follow up required             Psychosocial Re-Evaluation:   Psychosocial Discharge (Final Psychosocial Re-Evaluation):   Vocational Rehabilitation: Provide vocational rehab assistance to qualifying candidates.   Vocational Rehab Evaluation & Intervention:  Vocational Rehab - 11/10/20 1253       Initial Vocational Rehab Evaluation & Intervention   Assessment shows need for Vocational Rehabilitation No             Education: Education Goals: Education classes will be provided on a weekly basis, covering required topics. Participant will state understanding/return demonstration of topics presented.  Learning  Barriers/Preferences:  Learning Barriers/Preferences - 11/10/20 1246       Learning Barriers/Preferences   Learning Barriers None    Learning Preferences Audio;Computer/Internet;Group Instruction;Individual Instruction;Skilled Demonstration;Pictoral;Verbal Instruction;Video;Written Material             Education Topics: Hypertension, Hypertension Reduction -Define heart  disease and high blood pressure. Discus how high blood pressure affects the body and ways to reduce high blood pressure.   Exercise and Your Heart -Discuss why it is important to exercise, the FITT principles of exercise, normal and abnormal responses to exercise, and how to exercise safely.   Angina -Discuss definition of angina, causes of angina, treatment of angina, and how to decrease risk of having angina.   Cardiac Medications -Review what the following cardiac medications are used for, how they affect the body, and side effects that may occur when taking the medications.  Medications include Aspirin, Beta blockers, calcium channel blockers, ACE Inhibitors, angiotensin receptor blockers, diuretics, digoxin, and antihyperlipidemics.   Congestive Heart Failure -Discuss the definition of CHF, how to live with CHF, the signs and symptoms of CHF, and how keep track of weight and sodium intake.   Heart Disease and Intimacy -Discus the effect sexual activity has on the heart, how changes occur during intimacy as we age, and safety during sexual activity.   Smoking Cessation / COPD -Discuss different methods to quit smoking, the health benefits of quitting smoking, and the definition of COPD.   Nutrition I: Fats -Discuss the types of cholesterol, what cholesterol does to the heart, and how cholesterol levels can be controlled.   Nutrition II: Labels -Discuss the different components of food labels and how to read food label   Heart Parts/Heart Disease and PAD -Discuss the anatomy of the heart, the  pathway of blood circulation through the heart, and these are affected by heart disease.   Stress I: Signs and Symptoms -Discuss the causes of stress, how stress may lead to anxiety and depression, and ways to limit stress.   Stress II: Relaxation -Discuss different types of relaxation techniques to limit stress.   Warning Signs of Stroke / TIA -Discuss definition of a stroke, what the signs and symptoms are of a stroke, and how to identify when someone is having stroke.   Knowledge Questionnaire Score:  Knowledge Questionnaire Score - 11/10/20 1246       Knowledge Questionnaire Score   Pre Score 23/24             Core Components/Risk Factors/Patient Goals at Admission:  Personal Goals and Risk Factors at Admission - 11/10/20 1321       Core Components/Risk Factors/Patient Goals on Admission    Weight Management Yes;Weight Maintenance    Intervention Weight Management: Provide education and appropriate resources to help participant work on and attain dietary goals.;Weight Management: Develop a combined nutrition and exercise program designed to reach desired caloric intake, while maintaining appropriate intake of nutrient and fiber, sodium and fats, and appropriate energy expenditure required for the weight goal.    Expected Outcomes Short Term: Continue to assess and modify interventions until short term weight is achieved;Long Term: Adherence to nutrition and physical activity/exercise program aimed toward attainment of established weight goal;Weight Maintenance: Understanding of the daily nutrition guidelines, which includes 25-35% calories from fat, 7% or less cal from saturated fats, less than 200mg  cholesterol, less than 1.5gm of sodium, & 5 or more servings of fruits and vegetables daily    Improve shortness of breath with ADL's Yes    Intervention Provide education, individualized exercise plan and daily activity instruction to help decrease symptoms of SOB with  activities of daily living.    Expected Outcomes Short Term: Improve cardiorespiratory fitness to achieve a reduction of symptoms when performing ADLs;Long Term: Be able to perform more ADLs without  symptoms or delay the onset of symptoms    Heart Failure Yes    Intervention Provide a combined exercise and nutrition program that is supplemented with education, support and counseling about heart failure. Directed toward relieving symptoms such as shortness of breath, decreased exercise tolerance, and extremity edema.    Expected Outcomes Improve functional capacity of life;Short term: Attendance in program 2-3 days a week with increased exercise capacity. Reported lower sodium intake. Reported increased fruit and vegetable intake. Reports medication compliance.;Short term: Daily weights obtained and reported for increase. Utilizing diuretic protocols set by physician.;Long term: Adoption of self-care skills and reduction of barriers for early signs and symptoms recognition and intervention leading to self-care maintenance.             Core Components/Risk Factors/Patient Goals Review:    Core Components/Risk Factors/Patient Goals at Discharge (Final Review):    ITP Comments:   Comments: Patient arrived for 1st visit/orientation/education at 1230. Patient was referred to CR by Dr. Gardiner Rhyme due to NSTEMI (I21.4) and status post coronary artery stent placement (Z95.5). During orientation advised patient on arrival and appointment times what to wear, what to do before, during and after exercise. Reviewed attendance and class policy.  Pt is scheduled to return Cardiac Rehab on 11/13/2020 at 0815. Pt was advised to come to class 15 minutes before class starts.  Discussed RPE/Dpysnea scales. Patient participated in warm up stretches. Patient was able to complete 6 minute walk test.  Telemetry:NSR. Patient was measured for the equipment. Discussed equipment safety with patient. Took patient  pre-anthropometric measurements. Patient finished visit at 1345.

## 2020-11-13 ENCOUNTER — Encounter (HOSPITAL_COMMUNITY)
Admission: RE | Admit: 2020-11-13 | Discharge: 2020-11-13 | Disposition: A | Discharge status: Home / Self Care | Payer: Medicare Other | Source: Ambulatory Visit | Attending: Cardiovascular Disease | Admitting: Cardiovascular Disease

## 2020-11-13 ENCOUNTER — Other Ambulatory Visit: Payer: Self-pay

## 2020-11-13 ENCOUNTER — Other Ambulatory Visit (HOSPITAL_COMMUNITY): Payer: Self-pay

## 2020-11-13 VITALS — Wt 175.7 lb

## 2020-11-13 DIAGNOSIS — Z955 Presence of coronary angioplasty implant and graft: Secondary | ICD-10-CM

## 2020-11-13 DIAGNOSIS — I214 Non-ST elevation (NSTEMI) myocardial infarction: Secondary | ICD-10-CM

## 2020-11-13 NOTE — Progress Notes (Signed)
Daily Session Note  Patient Details  Name: Justin Carroll MRN: [id number] Date of Birth: 07/10/1955 Referring Provider:   Flowsheet Row CARDIAC REHAB PHASE II ORIENTATION from 11/10/2020 in [facility]  Referring Provider Dr. Gardiner Rhyme       Encounter Date: 11/13/2020  Check In:  Session Check In - 11/13/20 0815       Check-In   Supervising physician immediately available to respond to emergencies [facility] MD immediately available    Physician(s) Dr. Gardiner Rhyme    Location [facility]    Staff Present Hoy Register, MS, ACSM-CEP, Exercise Physiologist;Debra Wynetta Emery, RN, BSN    Virtual Visit No    Medication changes reported     No    Fall or balance concerns reported    No    Tobacco Cessation No Change    Warm-up and Cool-down Performed as group-led instruction    Resistance Training Performed Yes    VAD Patient? No    PAD/SET Patient? No      Pain Assessment   Currently in Pain? No/denies    Pain Score 0-No pain    Multiple Pain Sites No             Capillary Blood Glucose: No results found for this or any previous visit (from the past 24 hour(s)).    Social History   Tobacco Use  Smoking Status Former   Packs/day: 1.50   Years: 10.00   Pack years: 15.00   Types: Cigarettes   Start date: 1995   Quit date: 2015   Years since quitting: 7.7  Smokeless Tobacco Never    Goals Met:  Independence with exercise equipment Exercise tolerated well No report of concerns or symptoms today Strength training completed today  Goals Unmet:  Not Applicable  Comments: checkout time is 0915   Dr. Kathie Dike is Medical Director for [facility].

## 2020-11-15 ENCOUNTER — Encounter (HOSPITAL_COMMUNITY)
Admission: RE | Admit: 2020-11-15 | Discharge: 2020-11-15 | Disposition: A | Discharge status: Home / Self Care | Payer: Medicare Other | Source: Ambulatory Visit | Attending: Cardiovascular Disease | Admitting: Cardiovascular Disease

## 2020-11-15 ENCOUNTER — Other Ambulatory Visit: Payer: Self-pay

## 2020-11-15 DIAGNOSIS — Z955 Presence of coronary angioplasty implant and graft: Secondary | ICD-10-CM

## 2020-11-15 DIAGNOSIS — I214 Non-ST elevation (NSTEMI) myocardial infarction: Secondary | ICD-10-CM

## 2020-11-15 NOTE — Progress Notes (Signed)
Daily Session Note  Patient Details  Name: Justin Carroll MRN: [id number] Date of Birth: May 15, 1955 Referring Provider:   Flowsheet Row CARDIAC REHAB PHASE II ORIENTATION from 11/10/2020 in [facility]  Referring Provider Dr. Gardiner Rhyme       Encounter Date: 11/15/2020  Check In:  Session Check In - 11/15/20 0815       Check-In   Supervising physician immediately available to respond to emergencies [facility] MD immediately available    Physician(s) Dr. Audie Box    Location [facility]    Staff Present Hoy Register, MS, ACSM-CEP, Exercise Physiologist;Ahmarion Saraceno Zigmund Daniel, Exercise Physiologist;Debra Wynetta Emery, RN, BSN    Virtual Visit No    Medication changes reported     No    Fall or balance concerns reported    No    Tobacco Cessation No Change    Warm-up and Cool-down Performed as group-led instruction    Resistance Training Performed Yes    VAD Patient? No    PAD/SET Patient? No      Pain Assessment   Currently in Pain? No/denies    Pain Score 0-No pain    Multiple Pain Sites No             Capillary Blood Glucose: No results found for this or any previous visit (from the past 24 hour(s)).    Social History   Tobacco Use  Smoking Status Former   Packs/day: 1.50   Years: 10.00   Pack years: 15.00   Types: Cigarettes   Start date: 1995   Quit date: 2015   Years since quitting: 7.7  Smokeless Tobacco Never    Goals Met:  Independence with exercise equipment Exercise tolerated well No report of concerns or symptoms today Strength training completed today  Goals Unmet:  Not Applicable  Comments: check out 0915   Dr. Kathie Dike is Medical Director for [facility].

## 2020-11-15 NOTE — Progress Notes (Signed)
Cardiac Individual Treatment Plan  Patient Details  Name: Justin Carroll MRN: [id number] Date of Birth: March 12, 1955 Referring Provider:   Flowsheet Row CARDIAC REHAB PHASE II ORIENTATION from 11/10/2020 in [place]  Referring Provider Dr. Gardiner Rhyme       Initial Encounter Date:  Flowsheet Row CARDIAC REHAB PHASE II ORIENTATION from 11/10/2020 in [place]  Date 11/10/20       Visit Diagnosis: NSTEMI (non-ST elevated myocardial infarction) [facility])  Status post coronary artery stent placement  Patient's Home Medications on Admission:  Current Outpatient Medications:    aspirin 81 MG EC tablet, Take 1 tablet (81 mg total) by mouth daily. Swallow whole., Disp: 90 tablet, Rfl: 3   atorvastatin (LIPITOR) 80 MG tablet, Take 1 tablet (80 mg total) by mouth daily., Disp: 90 tablet, Rfl: 3   Azelastine HCl 137 MCG/SPRAY SOLN, USE 2 SPRAY(S) IN EACH NOSTRIL TWICE DAILY AS NEEDED FOR RHINITIS (Patient taking differently: Place 2 sprays into both nostrils 2 (two) times daily as needed (nasal congestion).), Disp: 30 mL, Rfl: 2   buPROPion (WELLBUTRIN XL) 300 MG 24 hr tablet, Take 1 tablet by mouth once daily, Disp: 90 tablet, Rfl: 0   carvedilol (COREG) 6.25 MG tablet, Take 1 tablet (6.25 mg total) by mouth 2 (two) times daily with a meal., Disp: 60 tablet, Rfl: 11   Cholecalciferol (VITAMIN D) 50 MCG (2000 UT) tablet, Take 2,000 Units by mouth daily., Disp: , Rfl:    Cholecalciferol (VITAMIN D-3 PO), Take 2,000 Units by mouth daily., Disp: , Rfl:    dapagliflozin propanediol (FARXIGA) 10 MG TABS tablet, Take 1 tablet (10 mg total) by mouth daily., Disp: 30 tablet, Rfl: 11   levocetirizine (XYZAL) 5 MG tablet, Take 5 mg by mouth every evening., Disp: , Rfl:    LORazepam (ATIVAN) 0.5 MG tablet, Take 1 tablet (0.5 mg total) by mouth daily as needed for anxiety., Disp: 7 tablet, Rfl: 0   mometasone (NASONEX) 50 MCG/ACT nasal spray, Place 1 spray into the  nose daily., Disp: 17 g, Rfl: 5   nitroGLYCERIN (NITROSTAT) 0.4 MG SL tablet, Place 1 tablet (0.4 mg total) under the tongue every 5 (five) minutes x 3 doses as needed for chest pain., Disp: 25 tablet, Rfl: 1   pantoprazole (PROTONIX) 40 MG tablet, Take 1 tablet by mouth once daily, Disp: 90 tablet, Rfl: 0   sacubitril-valsartan (ENTRESTO) 24-26 MG, Take 1 tablet by mouth 2 (two) times daily., Disp: 60 tablet, Rfl: 3   sertraline (ZOLOFT) 25 MG tablet, Take 0.5 tablets (12.5 mg total) by mouth at bedtime., Disp: 15 tablet, Rfl: 1   tamsulosin (FLOMAX) 0.4 MG CAPS capsule, TAKE 1 CAPSULE BY MOUTH ONCE DAILY AFTER SUPPER (Patient taking differently: Take 0.4 mg by mouth daily after supper.), Disp: 90 capsule, Rfl: 0   testosterone cypionate (DEPOTESTOSTERONE CYPIONATE) 200 MG/ML injection, INJECT 0.5 MLS INTO THE MUSCLE TWICE A WEEK. (Patient taking differently: Inject 100 mg into the muscle 2 (two) times a week.), Disp: 10 mL, Rfl: 2   ticagrelor (BRILINTA) 90 MG TABS tablet, Take 1 tablet (90 mg total) by mouth 2 (two) times daily., Disp: 60 tablet, Rfl: 11   traZODone (DESYREL) 100 MG tablet, TAKE 1 TABLET BY MOUTH AT BEDTIME, Disp: 90 tablet, Rfl: 0  Past Medical History: Past Medical History:  Diagnosis Date   Depression    Sinus infection    yearly    Tobacco Use: Social History   Tobacco Use  Smoking Status Former   Packs/day: 1.50   Years: 10.00   Pack years: 15.00   Types: Cigarettes   Start date: 1995   Quit date: 2015   Years since quitting: 7.7  Smokeless Tobacco Never    Labs: Recent Review Management consultant for [facility]hab Latest Ref Rng & Units 10/18/2020   Cholestrol 0 - 200 mg/dL 191   LDLCALC 0 - 99 mg/dL 106(H)   HDL >40 mg/dL 61   Trlycerides <150 mg/dL 120   Hemoglobin A1c 4.8 - 5.6 % 6.3(H)       Capillary Blood Glucose: Lab Results  Component Value Date   GLUCAP 127 (H) 10/20/2020   GLUCAP 90 10/20/2020   GLUCAP 104 (H)  10/19/2020   GLUCAP 84 10/19/2020   GLUCAP 124 (H) 10/19/2020     Exercise Target Goals: Exercise Program Goal: Individual exercise prescription set using results from initial 6 min walk test and THRR while considering  patient's activity barriers and safety.   Exercise Prescription Goal: Starting with aerobic activity 30 plus minutes a day, 3 days per week for initial exercise prescription. Provide home exercise prescription and guidelines that participant acknowledges understanding prior to discharge.  Activity Barriers & Risk Stratification:  Activity Barriers & Cardiac Risk Stratification - 11/10/20 1307       Activity Barriers & Cardiac Risk Stratification   Cardiac Risk Stratification High             6 Minute Walk:  6 Minute Walk     Row Name 11/10/20 1402         6 Minute Walk   Phase Initial     Distance 1500 feet     Walk Time 6 minutes     # of Rest Breaks 0     MPH 2.8     METS 3.17     RPE 12     VO2 Peak 11.12     Symptoms No     Resting HR 76 bpm     Resting BP 104/56     Resting Oxygen Saturation  96 %     Exercise Oxygen Saturation  during 6 min walk 95 %     Max Ex. HR 82 bpm     Max Ex. BP 120/60     2 Minute Post BP 100/58              Oxygen Initial Assessment:   Oxygen Re-Evaluation:   Oxygen Discharge (Final Oxygen Re-Evaluation):   Initial Exercise Prescription:  Initial Exercise Prescription - 11/10/20 1400       Date of Initial Exercise RX and Referring Provider   Date 11/10/20    Referring Provider Dr. Gardiner Rhyme    Expected Discharge Date 02/02/21      Treadmill   MPH 2.3    Grade 0    Minutes 22    METs 0      Recumbant Elliptical   Level 1    RPM 60    Minutes 17    METs 0      Prescription Details   Frequency (times per week) 3    Duration Progress to 30 minutes of continuous aerobic without signs/symptoms of physical distress      Intensity   THRR 40-80% of Max Heartrate 61-122    Ratings of  Perceived Exertion 11-15    Perceived Dyspnea 0-4      Progression   Progression Continue  to progress workloads to maintain intensity without signs/symptoms of physical distress.      Resistance Training   Training Prescription Yes    Weight 4    Reps 10-15             Perform Capillary Blood Glucose checks as needed.  Exercise Prescription Changes:   Exercise Prescription Changes     Row Name 11/13/20 1200             Response to Exercise   Blood Pressure (Admit) 114/68       Blood Pressure (Exercise) 144/74       Blood Pressure (Exit) 116/70       Heart Rate (Admit) 68 bpm       Heart Rate (Exercise) 87 bpm       Heart Rate (Exit) 77 bpm       Rating of Perceived Exertion (Exercise) 12       Duration Continue with 30 min of aerobic exercise without signs/symptoms of physical distress.       Intensity THRR unchanged               Progression   Progression Continue to progress workloads to maintain intensity without signs/symptoms of physical distress.               Resistance Training   Training Prescription Yes       Weight 4       Reps 10-15       Time 10 Minutes               NuStep   Level 1       SPM 93       Minutes 22       METs 2.2               Recumbant Elliptical   Level 1       RPM 56       Minutes 17       METs 4.1                Exercise Comments:   Exercise Goals and Review:   Exercise Goals     Row Name 11/10/20 1407 11/13/20 1242           Exercise Goals   Increase Physical Activity Yes Yes      Intervention Provide advice, education, support and counseling about physical activity/exercise needs.;Develop an individualized exercise prescription for aerobic and resistive training based on initial evaluation findings, risk stratification, comorbidities and participant's personal goals. Provide advice, education, support and counseling about physical activity/exercise needs.;Develop an individualized exercise  prescription for aerobic and resistive training based on initial evaluation findings, risk stratification, comorbidities and participant's personal goals.      Expected Outcomes Short Term: Attend rehab on a regular basis to increase amount of physical activity.;Long Term: Add in home exercise to make exercise part of routine and to increase amount of physical activity.;Long Term: Exercising regularly at least 3-5 days a week. Short Term: Attend rehab on a regular basis to increase amount of physical activity.;Long Term: Add in home exercise to make exercise part of routine and to increase amount of physical activity.;Long Term: Exercising regularly at least 3-5 days a week.      Increase Strength and Stamina Yes Yes      Intervention Provide advice, education, support and counseling about physical activity/exercise needs.;Develop an individualized exercise prescription for aerobic and resistive training based on initial evaluation  findings, risk stratification, comorbidities and participant's personal goals. Provide advice, education, support and counseling about physical activity/exercise needs.;Develop an individualized exercise prescription for aerobic and resistive training based on initial evaluation findings, risk stratification, comorbidities and participant's personal goals.      Expected Outcomes Short Term: Increase workloads from initial exercise prescription for resistance, speed, and METs.;Short Term: Perform resistance training exercises routinely during rehab and add in resistance training at home;Long Term: Improve cardiorespiratory fitness, muscular endurance and strength as measured by increased METs and functional capacity (6MWT) Short Term: Increase workloads from initial exercise prescription for resistance, speed, and METs.;Short Term: Perform resistance training exercises routinely during rehab and add in resistance training at home;Long Term: Improve cardiorespiratory fitness, muscular  endurance and strength as measured by increased METs and functional capacity (6MWT)      Able to understand and use rate of perceived exertion (RPE) scale Yes Yes      Intervention Provide education and explanation on how to use RPE scale Provide education and explanation on how to use RPE scale      Expected Outcomes Short Term: Able to use RPE daily in rehab to express subjective intensity level;Long Term:  Able to use RPE to guide intensity level when exercising independently Short Term: Able to use RPE daily in rehab to express subjective intensity level;Long Term:  Able to use RPE to guide intensity level when exercising independently      Knowledge and understanding of Target Heart Rate Range (THRR) Yes Yes      Intervention Provide education and explanation of THRR including how the numbers were predicted and where they are located for reference Provide education and explanation of THRR including how the numbers were predicted and where they are located for reference      Expected Outcomes Short Term: Able to state/look up THRR;Short Term: Able to use daily as guideline for intensity in rehab;Long Term: Able to use THRR to govern intensity when exercising independently Short Term: Able to state/look up THRR;Short Term: Able to use daily as guideline for intensity in rehab;Long Term: Able to use THRR to govern intensity when exercising independently      Able to check pulse independently Yes Yes      Intervention Review the importance of being able to check your own pulse for safety during independent exercise;Provide education and demonstration on how to check pulse in carotid and radial arteries. Review the importance of being able to check your own pulse for safety during independent exercise;Provide education and demonstration on how to check pulse in carotid and radial arteries.      Expected Outcomes Short Term: Able to explain why pulse checking is important during independent exercise;Long  Term: Able to check pulse independently and accurately Short Term: Able to explain why pulse checking is important during independent exercise;Long Term: Able to check pulse independently and accurately      Understanding of Exercise Prescription Yes Yes      Intervention Provide education, explanation, and written materials on patient's individual exercise prescription Provide education, explanation, and written materials on patient's individual exercise prescription      Expected Outcomes Short Term: Able to explain program exercise prescription;Long Term: Able to explain home exercise prescription to exercise independently Short Term: Able to explain program exercise prescription;Long Term: Able to explain home exercise prescription to exercise independently               Exercise Goals Re-Evaluation :  Exercise Goals Re-Evaluation  [place] Name 11/13/20 1242             Exercise Goal Re-Evaluation   Exercise Goals Review Increase Physical Activity;Increase Strength and Stamina;Able to understand and use rate of perceived exertion (RPE) scale;Knowledge and understanding of Target Heart Rate Range (THRR);Able to check pulse independently;Understanding of Exercise Prescription       Comments Pt attended 2 sessions of cardiac rehab. He is currently working at 4.1 METs of the Ellp. Will continue to montior and progress as able.       Expected Outcomes Through exercise at rehab and at home, the patient will meet their stated goals.                 Discharge Exercise Prescription (Final Exercise Prescription Changes):  Exercise Prescription Changes - 11/13/20 1200       Response to Exercise   Blood Pressure (Admit) 114/68    Blood Pressure (Exercise) 144/74    Blood Pressure (Exit) 116/70    Heart Rate (Admit) 68 bpm    Heart Rate (Exercise) 87 bpm    Heart Rate (Exit) 77 bpm    Rating of Perceived Exertion (Exercise) 12    Duration Continue with 30 min of aerobic exercise  without signs/symptoms of physical distress.    Intensity THRR unchanged      Progression   Progression Continue to progress workloads to maintain intensity without signs/symptoms of physical distress.      Resistance Training   Training Prescription Yes    Weight 4    Reps 10-15    Time 10 Minutes      NuStep   Level 1    SPM 93    Minutes 22    METs 2.2      Recumbant Elliptical   Level 1    RPM 56    Minutes 17    METs 4.1             Nutrition:  Target Goals: Understanding of nutrition guidelines, daily intake of sodium 1500mg , cholesterol 200mg , calories 30% from fat and 7% or less from saturated fats, daily to have 5 or more servings of fruits and vegetables.  Biometrics:  Pre Biometrics - 11/10/20 1408       Pre Biometrics   Height 5\' 7"  (1.702 m)    Weight 79.3 kg    Waist Circumference 41 inches    Hip Circumference 39 inches    Waist to Hip Ratio 1.05 %    BMI (Calculated) 27.37    Triceps Skinfold 8 mm    % Body Fat 25.5 %    Grip Strength 28.5 kg    Flexibility 4.5 in    Single Leg Stand 19 seconds              Nutrition Therapy Plan and Nutrition Goals:  Nutrition Therapy & Goals - 11/15/20 0715       Personal Nutrition Goals   Comments Patient scored 20 on his diet assessment. We offer 2 educational sessions on heart healthy nutrition with handouts and assistance with RD referral if patient is interested.      Intervention Plan   Intervention Nutrition handout(s) given to patient.    Expected Outcomes Short Term Goal: Understand basic principles of dietary content, such as calories, fat, sodium, cholesterol and nutrients.             Nutrition Assessments:  Nutrition Assessments - 11/10/20 1245       MEDFICTS Scores  Pre Score 20            MEDIFICTS Score Key: ?70 Need to make dietary changes  40-70 Heart Healthy Diet ? 40 Therapeutic Level Cholesterol Diet   Picture Your Plate Scores: <88 Unhealthy dietary  pattern with much room for improvement. 41-50 Dietary pattern unlikely to meet recommendations for good health and room for improvement. 51-60 More healthful dietary pattern, with some room for improvement.  >60 Healthy dietary pattern, although there may be some specific behaviors that could be improved.    Nutrition Goals Re-Evaluation:   Nutrition Goals Discharge (Final Nutrition Goals Re-Evaluation):   Psychosocial: Target Goals: Acknowledge presence or absence of significant depression and/or stress, maximize coping skills, provide positive support system. Participant is able to verbalize types and ability to use techniques and skills needed for reducing stress and depression.  Initial Review & Psychosocial Screening:  Initial Psych Review & Screening - 11/10/20 1309       Initial Review   Current issues with Current Sleep Concerns;Current Depression;Current Anxiety/Panic   Takes zoloft for depression and ativan for anxiety. His sleep is altered but he was recently diagnosed with OSA and is waiting on his CPAP to arrive.     Family Dynamics   Good Support System? Yes    Comments His support system is his wife.      Barriers   Psychosocial barriers to participate in program The patient should benefit from training in stress management and relaxation.      Screening Interventions   Interventions Encouraged to exercise;To provide support and resources with identified psychosocial needs;Provide feedback about the scores to participant    Expected Outcomes Long Term Goal: Stressors or current issues are controlled or eliminated.;Short Term goal: Identification and review with participant of any Quality of Life or Depression concerns found by scoring the questionnaire.;Long Term goal: The participant improves quality of Life and PHQ9 Scores as seen by post scores and/or verbalization of changes             Quality of Life Scores:  Quality of Life - 11/10/20 1409        Quality of Life   Select Quality of Life      Quality of Life Scores   Health/Function Pre 15.6 %    Socioeconomic Pre 20.5 %    Psych/Spiritual Pre 21.43 %    Family Pre 22.8 %    GLOBAL Pre 18.87 %            Scores of 19 and below usually indicate a poorer quality of life in these areas.  A difference of  2-3 points is a clinically meaningful difference.  A difference of 2-3 points in the total score of the Quality of Life Index has been associated with significant improvement in overall quality of life, self-image, physical symptoms, and general health in studies assessing change in quality of life.  PHQ-9: Recent Review Flowsheet Data     Depression screen [facility] 2/9 11/10/2020 05/17/2020   Decreased Interest 1 0   Down, Depressed, Hopeless 0 0   PHQ - 2 Score 1 0   Altered sleeping 1 0   Tired, decreased energy 2 0   Change in appetite 1 0   Feeling bad or failure about yourself  0 0   Trouble concentrating 1 0   Moving slowly or fidgety/restless 0 0   Suicidal thoughts 0 0   PHQ-9 Score 6 0   Difficult doing work/chores Very difficult Not  difficult at all      Interpretation of Total Score  Total Score Depression Severity:  1-4 = Minimal depression, 5-9 = Mild depression, 10-14 = Moderate depression, 15-19 = Moderately severe depression, 20-27 = Severe depression   Psychosocial Evaluation and Intervention:  Psychosocial Evaluation - 11/10/20 1330       Psychosocial Evaluation & Interventions   Interventions Stress management education;Relaxation education;Encouraged to exercise with the program and follow exercise prescription    Comments Pt has no barriers to completing cardiac rehab. He does have anxiety and depression. His anxiety is treated with ativan and his depression is treated with zoloft. He reports being unhappy lately due to his recent NSTEMI. 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 This is all very different to him since  he is out of work for now. He also reports problems with his sleep, but he was recently diagnosed with OSA. He has ordered a CPAP and is waiting for it to come in. He reports that he has a good support system from his wife. He reports that he has made several lifestyle changes since his NSTEMI such as cleaning up his diet. His goals while in the program are to gain his strength and stamina back. He is eager to start rehab and to use his time off of work to improve his health.    Expected Outcomes The patient will continue to take his medications for depression and anxiety and will have no further identifiable psychosocial issues.    Continue Psychosocial Services  No Follow up required             Psychosocial Re-Evaluation:  Psychosocial Re-Evaluation     [place] Name 11/15/20 0718             Psychosocial Re-Evaluation   Current issues with Current Depression;Current Anxiety/Panic;Current Sleep Concerns       Comments Patient is new to the program completing 3 sessions. He continues to have no psychosocial barriers identified. His depression/anxiety and sleep all all managed with Zoloft, Lorazepam, Wellbutrin and Trazodone. We will continue to monitor.       Expected Outcomes Patient will continue to have no psychosocial barriers identified.       Interventions Stress management education;Encouraged to attend Cardiac Rehabilitation for the exercise;Relaxation education       Continue Psychosocial Services  No Follow up required                Psychosocial Discharge (Final Psychosocial Re-Evaluation):  Psychosocial Re-Evaluation - 11/15/20 0718       Psychosocial Re-Evaluation   Current issues with Current Depression;Current Anxiety/Panic;Current Sleep Concerns    Comments Patient is new to the program completing 3 sessions. He continues to have no psychosocial barriers identified. His depression/anxiety and sleep all all managed with Zoloft, Lorazepam, Wellbutrin and Trazodone. We  will continue to monitor.    Expected Outcomes Patient will continue to have no psychosocial barriers identified.    Interventions Stress management education;Encouraged to attend Cardiac Rehabilitation for the exercise;Relaxation education    Continue Psychosocial Services  No Follow up required             Vocational Rehabilitation: Provide vocational rehab assistance to qualifying candidates.   Vocational Rehab Evaluation & Intervention:  Vocational Rehab - 11/10/20 1253       Initial Vocational Rehab Evaluation & Intervention   Assessment shows need for Vocational Rehabilitation No  Education: Education Goals: Education classes will be provided on a weekly basis, covering required topics. Participant will state understanding/return demonstration of topics presented.  Learning Barriers/Preferences:  Learning Barriers/Preferences - 11/10/20 1246       Learning Barriers/Preferences   Learning Barriers None    Learning Preferences Audio;Computer/Internet;Group Instruction;Individual Instruction;Skilled Demonstration;Pictoral;Verbal Instruction;Video;Written Material             Education Topics: Hypertension, Hypertension Reduction -Define heart disease and high blood pressure. Discus how high blood pressure affects the body and ways to reduce high blood pressure.   Exercise and Your Heart -Discuss why it is important to exercise, the FITT principles of exercise, normal and abnormal responses to exercise, and how to exercise safely.   Angina -Discuss definition of angina, causes of angina, treatment of angina, and how to decrease risk of having angina.   Cardiac Medications -Review what the following cardiac medications are used for, how they affect the body, and side effects that may occur when taking the medications.  Medications include Aspirin, Beta blockers, calcium channel blockers, ACE Inhibitors, angiotensin receptor blockers, diuretics,  digoxin, and antihyperlipidemics.   Congestive Heart Failure -Discuss the definition of CHF, how to live with CHF, the signs and symptoms of CHF, and how keep track of weight and sodium intake.   Heart Disease and Intimacy -Discus the effect sexual activity has on the heart, how changes occur during intimacy as we age, and safety during sexual activity.   Smoking Cessation / COPD -Discuss different methods to quit smoking, the health benefits of quitting smoking, and the definition of COPD.   Nutrition I: Fats -Discuss the types of cholesterol, what cholesterol does to the heart, and how cholesterol levels can be controlled.   Nutrition II: Labels -Discuss the different components of food labels and how to read food label   Heart Parts/Heart Disease and PAD -Discuss the anatomy of the heart, the pathway of blood circulation through the heart, and these are affected by heart disease.   Stress I: Signs and Symptoms -Discuss the causes of stress, how stress may lead to anxiety and depression, and ways to limit stress.   Stress II: Relaxation -Discuss different types of relaxation techniques to limit stress.   Warning Signs of Stroke / TIA -Discuss definition of a stroke, what the signs and symptoms are of a stroke, and how to identify when someone is having stroke.   Knowledge Questionnaire Score:  Knowledge Questionnaire Score - 11/10/20 1246       Knowledge Questionnaire Score   Pre Score 23/24             Core Components/Risk Factors/Patient Goals at Admission:  Personal Goals and Risk Factors at Admission - 11/10/20 1321       Core Components/Risk Factors/Patient Goals on Admission    Weight Management Yes;Weight Maintenance    Intervention Weight Management: Provide education and appropriate resources to help participant work on and attain dietary goals.;Weight Management: Develop a combined nutrition and exercise program designed to reach desired caloric  intake, while maintaining appropriate intake of nutrient and fiber, sodium and fats, and appropriate energy expenditure required for the weight goal.    Expected Outcomes Short Term: Continue to assess and modify interventions until short term weight is achieved;Long Term: Adherence to nutrition and physical activity/exercise program aimed toward attainment of established weight goal;Weight Maintenance: Understanding of the daily nutrition guidelines, which includes 25-35% calories from fat, 7% or less cal from saturated fats, less than 200mg  cholesterol, less  than 1.5gm of sodium, & 5 or more servings of fruits and vegetables daily    Improve shortness of breath with ADL's Yes    Intervention Provide education, individualized exercise plan and daily activity instruction to help decrease symptoms of SOB with activities of daily living.    Expected Outcomes Short Term: Improve cardiorespiratory fitness to achieve a reduction of symptoms when performing ADLs;Long Term: Be able to perform more ADLs without symptoms or delay the onset of symptoms    Heart Failure Yes    Intervention Provide a combined exercise and nutrition program that is supplemented with education, support and counseling about heart failure. Directed toward relieving symptoms such as shortness of breath, decreased exercise tolerance, and extremity edema.    Expected Outcomes Improve functional capacity of life;Short term: Attendance in program 2-3 days a week with increased exercise capacity. Reported lower sodium intake. Reported increased fruit and vegetable intake. Reports medication compliance.;Short term: Daily weights obtained and reported for increase. Utilizing diuretic protocols set by physician.;Long term: Adoption of self-care skills and reduction of barriers for early signs and symptoms recognition and intervention leading to self-care maintenance.             Core Components/Risk Factors/Patient Goals Review:   Goals and  Risk Factor Review     Row Name 11/15/20 0717             Core Components/Risk Factors/Patient Goals Review   Personal Goals Review Weight Management/Obesity;Heart Failure;Other       Review Patient was referred to CR with CHF. He has multiple risk factors for CAD and is participating in the program for risk modification. He is new to the program completing 3 sessions. His personal goals for the program are to improve his strength and stamina. We will continue to monitor his program as he works towards meeting these goals.       Expected Outcomes Patient will complete the program meeting both personal and program goals.                Core Components/Risk Factors/Patient Goals at Discharge (Final Review):   Goals and Risk Factor Review - 11/15/20 0717       Core Components/Risk Factors/Patient Goals Review   Personal Goals Review Weight Management/Obesity;Heart Failure;Other    Review Patient was referred to CR with CHF. He has multiple risk factors for CAD and is participating in the program for risk modification. He is new to the program completing 3 sessions. His personal goals for the program are to improve his strength and stamina. We will continue to monitor his program as he works towards meeting these goals.    Expected Outcomes Patient will complete the program meeting both personal and program goals.             ITP Comments:   Comments: ITP REVIEW Pt is making expected progress toward Cardiac Rehab goals after completing 3 sessions. Recommend continued exercise, life style modification, education, and increased stamina and strength.

## 2020-11-17 ENCOUNTER — Encounter (HOSPITAL_COMMUNITY)
Admission: RE | Admit: 2020-11-17 | Discharge: 2020-11-17 | Disposition: A | Discharge status: Home / Self Care | Payer: Medicare Other | Source: Ambulatory Visit | Attending: Cardiovascular Disease | Admitting: Cardiovascular Disease

## 2020-11-17 DIAGNOSIS — I214 Non-ST elevation (NSTEMI) myocardial infarction: Secondary | ICD-10-CM

## 2020-11-17 DIAGNOSIS — Z955 Presence of coronary angioplasty implant and graft: Secondary | ICD-10-CM

## 2020-11-17 NOTE — Progress Notes (Signed)
Daily Session Note  Patient Details  Name: Justin Carroll MRN: [id number] Date of Birth: April 10, 1955 Referring Provider:   Flowsheet Row CARDIAC REHAB PHASE II ORIENTATION from 11/10/2020 in [place]  Referring Provider Dr. Gardiner Rhyme       Encounter Date: 11/17/2020  Check In:  Session Check In - 11/17/20 0820       Check-In   Supervising physician immediately available to respond to emergencies [facility] MD immediately available    Physician(s) Dr. Audie Box    Location [facility]    Staff Present Redge Gainer, BS, Exercise Physiologist;Debra Wynetta Emery, RN, BSN    Virtual Visit No    Medication changes reported     No    Fall or balance concerns reported    No    Tobacco Cessation No Change    Warm-up and Cool-down Not performed (comment)   Pt showed up late   Resistance Training Performed No    VAD Patient? No    PAD/SET Patient? No      Pain Assessment   Currently in Pain? No/denies    Pain Score 0-No pain    Multiple Pain Sites No             Capillary Blood Glucose: No results found for this or any previous visit (from the past 24 hour(s)).    Social History   Tobacco Use  Smoking Status Former   Packs/day: 1.50   Years: 10.00   Pack years: 15.00   Types: Cigarettes   Start date: 1995   Quit date: 2015   Years since quitting: 7.7  Smokeless Tobacco Never    Goals Met:  Independence with exercise equipment Exercise tolerated well No report of concerns or symptoms today Strength training completed today  Goals Unmet:  Not Applicable  Comments: check out 0900   Dr. Kathie Dike is Medical Director for [facility] Pulmonary Rehab.

## 2020-11-20 ENCOUNTER — Encounter (HOSPITAL_COMMUNITY)
Admission: RE | Admit: 2020-11-20 | Discharge: 2020-11-20 | Disposition: A | Discharge status: Home / Self Care | Payer: Medicare Other | Source: Ambulatory Visit | Attending: Cardiovascular Disease | Admitting: Cardiovascular Disease

## 2020-11-20 DIAGNOSIS — Z955 Presence of coronary angioplasty implant and graft: Secondary | ICD-10-CM

## 2020-11-20 DIAGNOSIS — I214 Non-ST elevation (NSTEMI) myocardial infarction: Secondary | ICD-10-CM

## 2020-11-20 NOTE — Progress Notes (Signed)
Daily Session Note  Patient Details  Name: Justin Carroll MRN: [id number] Date of Birth: 1955/12/10 Referring Provider:   Flowsheet Row CARDIAC REHAB PHASE II ORIENTATION from 11/10/2020 in [place]  Referring Provider Dr. Gardiner Rhyme       Encounter Date: 11/20/2020  Check In:  Session Check In - 11/20/20 0815       Check-In   Supervising physician immediately available to respond to emergencies [facility] MD immediately available    Physician(s) Dr. Audie Box    Location [facility]    Staff Present Hoy Register, MS, ACSM-CEP, Exercise Physiologist;Debra Wynetta Emery, RN, BSN    Virtual Visit No    Medication changes reported     No    Fall or balance concerns reported    No    Tobacco Cessation No Change    Warm-up and Cool-down Performed as group-led instruction    Resistance Training Performed Yes    VAD Patient? No    PAD/SET Patient? No      Pain Assessment   Currently in Pain? No/denies    Pain Score 0-No pain    Multiple Pain Sites No             Capillary Blood Glucose: No results found for this or any previous visit (from the past 24 hour(s)).    Social History   Tobacco Use  Smoking Status Former   Packs/day: 1.50   Years: 10.00   Pack years: 15.00   Types: Cigarettes   Start date: 1995   Quit date: 2015   Years since quitting: 7.7  Smokeless Tobacco Never    Goals Met:  Independence with exercise equipment Exercise tolerated well No report of concerns or symptoms today Strength training completed today  Goals Unmet:  Not Applicable  Comments: checkout time is 0915   Dr. Kathie Dike is Medical Director for [facility] Pulmonary Rehab.

## 2020-11-22 ENCOUNTER — Encounter: Payer: Medicare Other | Attending: Psychology | Admitting: Psychology

## 2020-11-22 ENCOUNTER — Encounter (HOSPITAL_COMMUNITY)
Admission: RE | Admit: 2020-11-22 | Discharge: 2020-11-22 | Disposition: A | Discharge status: Home / Self Care | Payer: Medicare Other | Source: Ambulatory Visit | Attending: Cardiovascular Disease | Admitting: Cardiovascular Disease

## 2020-11-22 ENCOUNTER — Other Ambulatory Visit: Payer: Self-pay

## 2020-11-22 DIAGNOSIS — R41840 Attention and concentration deficit: Secondary | ICD-10-CM | POA: Insufficient documentation

## 2020-11-22 DIAGNOSIS — Z955 Presence of coronary angioplasty implant and graft: Secondary | ICD-10-CM

## 2020-11-22 DIAGNOSIS — R413 Other amnesia: Secondary | ICD-10-CM

## 2020-11-22 DIAGNOSIS — I214 Non-ST elevation (NSTEMI) myocardial infarction: Secondary | ICD-10-CM | POA: Diagnosis not present

## 2020-11-22 NOTE — Progress Notes (Signed)
Daily Session Note  Patient Details  Name: Justin Carroll MRN: [id number] Date of Birth: November 20, 1955 Referring Provider:   Flowsheet Row CARDIAC REHAB PHASE II ORIENTATION from 11/10/2020 in [place]  Referring Provider Dr. Gardiner Rhyme       Encounter Date: 11/22/2020  Check In:  Session Check In - 11/22/20 0809       Check-In   Supervising physician immediately available to respond to emergencies [facility] MD immediately available    Physician(s) Dr. Domenic Polite    Location [facility]    Staff Present Hoy Register, MS, ACSM-CEP, Exercise Physiologist;Debra Wynetta Emery, RN, BSN;Heather Otho Ket, BS, Exercise Physiologist    Virtual Visit No    Medication changes reported     No    Fall or balance concerns reported    No    Tobacco Cessation No Change    Warm-up and Cool-down Performed as group-led instruction    Resistance Training Performed Yes    VAD Patient? No    PAD/SET Patient? No      Pain Assessment   Currently in Pain? No/denies    Pain Score 0-No pain    Multiple Pain Sites No             Capillary Blood Glucose: No results found for this or any previous visit (from the past 24 hour(s)).    Social History   Tobacco Use  Smoking Status Former   Packs/day: 1.50   Years: 10.00   Pack years: 15.00   Types: Cigarettes   Start date: 1995   Quit date: 2015   Years since quitting: 7.8  Smokeless Tobacco Never    Goals Met:  Independence with exercise equipment Exercise tolerated well No report of concerns or symptoms today Strength training completed today  Goals Unmet:  Not Applicable  Comments: checkout time is 0915   Dr. Kathie Dike is Medical Director for [facility] Pulmonary Rehab.

## 2020-11-24 ENCOUNTER — Encounter (HOSPITAL_COMMUNITY)
Admission: RE | Admit: 2020-11-24 | Discharge: 2020-11-24 | Disposition: A | Discharge status: Home / Self Care | Payer: Medicare Other | Source: Ambulatory Visit | Attending: Cardiovascular Disease | Admitting: Cardiovascular Disease

## 2020-11-24 ENCOUNTER — Other Ambulatory Visit: Payer: Self-pay

## 2020-11-24 DIAGNOSIS — Z955 Presence of coronary angioplasty implant and graft: Secondary | ICD-10-CM

## 2020-11-24 DIAGNOSIS — I214 Non-ST elevation (NSTEMI) myocardial infarction: Secondary | ICD-10-CM | POA: Diagnosis not present

## 2020-11-24 NOTE — Progress Notes (Signed)
Daily Session Note  Patient Details  Name: Justin Carroll MRN: [id number] Date of Birth: 1955-06-28 Referring Provider:   Flowsheet Row CARDIAC REHAB PHASE II ORIENTATION from 11/10/2020 in [place]  Referring Provider Dr. Gardiner Rhyme       Encounter Date: 11/24/2020  Check In:  Session Check In - 11/24/20 0815       Check-In   Supervising physician immediately available to respond to emergencies [facility] MD immediately available    Physician(s) Dr. Domenic Polite    Location [facility]    Staff Present Geanie Cooley, RN;Dalton Kris Mouton, MS, ACSM-CEP, Exercise Physiologist;Heather Zigmund Daniel, Exercise Physiologist    Virtual Visit No    Medication changes reported     No    Fall or balance concerns reported    No    Tobacco Cessation No Change    Warm-up and Cool-down Performed as group-led instruction    Resistance Training Performed Yes    VAD Patient? No    PAD/SET Patient? No      Pain Assessment   Currently in Pain? No/denies    Pain Score 0-No pain    Multiple Pain Sites No             Capillary Blood Glucose: No results found for this or any previous visit (from the past 24 hour(s)).    Social History   Tobacco Use  Smoking Status Former   Packs/day: 1.50   Years: 10.00   Pack years: 15.00   Types: Cigarettes   Start date: 1995   Quit date: 2015   Years since quitting: 7.8  Smokeless Tobacco Never    Goals Met:  Independence with exercise equipment Exercise tolerated well No report of concerns or symptoms today Strength training completed today  Goals Unmet:  Not Applicable  Comments: check out @ 9:15am   Dr. Kathie Dike is Medical Director for [facility] Pulmonary Rehab.

## 2020-11-27 ENCOUNTER — Encounter (HOSPITAL_COMMUNITY)
Admission: RE | Admit: 2020-11-27 | Discharge: 2020-11-27 | Disposition: A | Discharge status: Home / Self Care | Payer: Medicare Other | Source: Ambulatory Visit | Attending: Cardiovascular Disease | Admitting: Cardiovascular Disease

## 2020-11-27 VITALS — Wt 174.6 lb

## 2020-11-27 DIAGNOSIS — Z955 Presence of coronary angioplasty implant and graft: Secondary | ICD-10-CM

## 2020-11-27 DIAGNOSIS — I214 Non-ST elevation (NSTEMI) myocardial infarction: Secondary | ICD-10-CM

## 2020-11-27 NOTE — Progress Notes (Signed)
Daily Session Note  Patient Details  Name: Justin Carroll MRN: [id number] Date of Birth: 1955/06/29 Referring Provider:   Flowsheet Row CARDIAC REHAB PHASE II ORIENTATION from 11/10/2020 in [place]  Referring Provider Dr. Gardiner Rhyme       Encounter Date: 11/27/2020  Check In:  Session Check In - 11/27/20 0815       Check-In   Supervising physician immediately available to respond to emergencies [facility] MD immediately available    Physician(s) Dr. Harrington Challenger    Location [facility]    Staff Present Geanie Cooley, RN;Dalton Fletcher, MS, ACSM-CEP, Exercise Physiologist    Virtual Visit No    Medication changes reported     No    Fall or balance concerns reported    No    Tobacco Cessation No Change    Warm-up and Cool-down Performed as group-led instruction    Resistance Training Performed Yes    VAD Patient? No    PAD/SET Patient? No      Pain Assessment   Currently in Pain? No/denies    Pain Score 0-No pain    Multiple Pain Sites No             Capillary Blood Glucose: No results found for this or any previous visit (from the past 24 hour(s)).    Social History   Tobacco Use  Smoking Status Former   Packs/day: 1.50   Years: 10.00   Pack years: 15.00   Types: Cigarettes   Start date: 1995   Quit date: 2015   Years since quitting: 7.8  Smokeless Tobacco Never    Goals Met:  Independence with exercise equipment Exercise tolerated well No report of concerns or symptoms today Strength training completed today  Goals Unmet:  Not Applicable  Comments: check out @ 9:15am   Dr. Kathie Dike is Medical Director for [facility] Pulmonary Rehab.

## 2020-11-29 ENCOUNTER — Encounter (HOSPITAL_COMMUNITY)
Admission: RE | Admit: 2020-11-29 | Discharge: 2020-11-29 | Disposition: A | Discharge status: Home / Self Care | Payer: Medicare Other | Source: Ambulatory Visit | Attending: Cardiovascular Disease | Admitting: Cardiovascular Disease

## 2020-11-29 DIAGNOSIS — I214 Non-ST elevation (NSTEMI) myocardial infarction: Secondary | ICD-10-CM | POA: Diagnosis not present

## 2020-11-29 DIAGNOSIS — Z955 Presence of coronary angioplasty implant and graft: Secondary | ICD-10-CM

## 2020-11-29 NOTE — Progress Notes (Signed)
Daily Session Note  Patient Details  Name: Justin Carroll MRN: [id number] Date of Birth: Oct 28, 1955 Referring Provider:   Flowsheet Row CARDIAC REHAB PHASE II ORIENTATION from 11/10/2020 in [place]  Referring Provider Dr. Gardiner Rhyme       Encounter Date: 11/29/2020  Check In:  Session Check In - 11/29/20 0815       Check-In   Supervising physician immediately available to respond to emergencies [facility] MD immediately available    Physician(s) Dr. Harl Bowie    Location [facility]    Staff Present Geanie Cooley, RN;Dalton Fletcher, MS, ACSM-CEP, Exercise Physiologist    Virtual Visit No    Medication changes reported     No    Fall or balance concerns reported    No    Tobacco Cessation No Change    Warm-up and Cool-down Performed as group-led instruction    Resistance Training Performed Yes    VAD Patient? No    PAD/SET Patient? No      Pain Assessment   Currently in Pain? No/denies    Pain Score 0-No pain    Multiple Pain Sites No             Capillary Blood Glucose: No results found for this or any previous visit (from the past 24 hour(s)).    Social History   Tobacco Use  Smoking Status Former   Packs/day: 1.50   Years: 10.00   Pack years: 15.00   Types: Cigarettes   Start date: 1995   Quit date: 2015   Years since quitting: 7.8  Smokeless Tobacco Never    Goals Met:  Independence with exercise equipment Exercise tolerated well No report of concerns or symptoms today Strength training completed today  Goals Unmet:  Not Applicable  Comments: check out @  9:15am   Dr. Kathie Dike is Medical Director for [facility] Pulmonary Rehab.

## 2020-12-01 ENCOUNTER — Other Ambulatory Visit: Payer: Self-pay

## 2020-12-01 ENCOUNTER — Encounter (HOSPITAL_COMMUNITY)
Admission: RE | Admit: 2020-12-01 | Discharge: 2020-12-01 | Disposition: A | Discharge status: Home / Self Care | Payer: Medicare Other | Source: Ambulatory Visit | Attending: Cardiovascular Disease | Admitting: Cardiovascular Disease

## 2020-12-01 DIAGNOSIS — Z955 Presence of coronary angioplasty implant and graft: Secondary | ICD-10-CM

## 2020-12-01 DIAGNOSIS — I214 Non-ST elevation (NSTEMI) myocardial infarction: Secondary | ICD-10-CM

## 2020-12-01 NOTE — Progress Notes (Signed)
Daily Session Note  Patient Details  Name: Justin Carroll MRN: [id number] Date of Birth: 1955/11/13 Referring Provider:   Flowsheet Row CARDIAC REHAB PHASE II ORIENTATION from 11/10/2020 in [place]  Referring Provider Dr. Gardiner Rhyme       Encounter Date: 12/01/2020  Check In:  Session Check In - 12/01/20 0815       Check-In   Supervising physician immediately available to respond to emergencies [facility] MD immediately available    Physician(s) Dr. Harl Bowie    Location [facility]    Staff Present Hoy Register, MS, ACSM-CEP, Exercise Physiologist;Other    Virtual Visit No    Medication changes reported     No    Fall or balance concerns reported    No    Tobacco Cessation No Change    Warm-up and Cool-down Performed as group-led instruction    Resistance Training Performed Yes    VAD Patient? No    PAD/SET Patient? No      Pain Assessment   Currently in Pain? No/denies    Pain Score 0-No pain    Multiple Pain Sites No             Capillary Blood Glucose: No results found for this or any previous visit (from the past 24 hour(s)).    Social History   Tobacco Use  Smoking Status Former   Packs/day: 1.50   Years: 10.00   Pack years: 15.00   Types: Cigarettes   Start date: 1995   Quit date: 2015   Years since quitting: 7.8  Smokeless Tobacco Never    Goals Met:  Independence with exercise equipment Exercise tolerated well No report of concerns or symptoms today Strength training completed today  Goals Unmet:  Not Applicable  Comments: checkout time is 0915   Dr. Kathie Dike is Medical Director for [facility] Pulmonary Rehab.

## 2020-12-04 ENCOUNTER — Encounter (HOSPITAL_COMMUNITY)
Admission: RE | Admit: 2020-12-04 | Discharge: 2020-12-04 | Disposition: A | Discharge status: Home / Self Care | Payer: Medicare Other | Source: Ambulatory Visit | Attending: Cardiovascular Disease | Admitting: Cardiovascular Disease

## 2020-12-04 ENCOUNTER — Other Ambulatory Visit: Payer: Self-pay

## 2020-12-04 DIAGNOSIS — I214 Non-ST elevation (NSTEMI) myocardial infarction: Secondary | ICD-10-CM | POA: Diagnosis not present

## 2020-12-04 DIAGNOSIS — Z955 Presence of coronary angioplasty implant and graft: Secondary | ICD-10-CM

## 2020-12-04 NOTE — Progress Notes (Signed)
Daily Session Note  Patient Details  Name: Justin Carroll MRN: [id number] Date of Birth: [birth year] Referring Provider:   Flowsheet Row CARDIAC REHAB PHASE II ORIENTATION from 11/10/2020 in [place]  Referring Provider Dr. Gardiner Rhyme       Encounter Date: 12/04/2020  Check In:  Session Check In - 12/04/20 0815       Check-In   Supervising physician immediately available to respond to emergencies [facility] MD immediately available    Physician(s) Dr. Domenic Polite    Location [facility]    Staff Present Hoy Register, MS, ACSM-CEP, Exercise Physiologist;Debra Wynetta Emery, RN, BSN    Virtual Visit No    Medication changes reported     No    Fall or balance concerns reported    No    Tobacco Cessation No Change    Warm-up and Cool-down Performed as group-led instruction    Resistance Training Performed Yes    VAD Patient? No    PAD/SET Patient? No      Pain Assessment   Currently in Pain? No/denies    Pain Score 0-No pain    Multiple Pain Sites No             Capillary Blood Glucose: No results found for this or any previous visit (from the past 24 hour(s)).    Social History   Tobacco Use  Smoking Status Former   Packs/day: 1.50   Years: 10.00   Pack years: 15.00   Types: Cigarettes   Start date: 1995   Quit date: 2015   Years since quitting: 7.8  Smokeless Tobacco Never    Goals Met:  Independence with exercise equipment Exercise tolerated well No report of concerns or symptoms today Strength training completed today  Goals Unmet:  Not Applicable  Comments: checkout time is 0915   Dr. Kathie Dike is Medical Director for [facility] Pulmonary Rehab.

## 2020-12-06 ENCOUNTER — Encounter (HOSPITAL_COMMUNITY): Payer: Self-pay

## 2020-12-06 ENCOUNTER — Other Ambulatory Visit: Payer: Self-pay

## 2020-12-06 ENCOUNTER — Ambulatory Visit (HOSPITAL_COMMUNITY)
Admission: RE | Admit: 2020-12-06 | Discharge: 2020-12-06 | Disposition: A | Discharge status: Home / Self Care | Payer: Medicare Other | Source: Ambulatory Visit | Attending: Cardiology | Admitting: Cardiology

## 2020-12-06 ENCOUNTER — Encounter (HOSPITAL_COMMUNITY)
Admission: RE | Admit: 2020-12-06 | Discharge: 2020-12-06 | Disposition: A | Discharge status: Home / Self Care | Payer: Medicare Other | Source: Ambulatory Visit | Attending: Cardiovascular Disease | Admitting: Cardiovascular Disease

## 2020-12-06 ENCOUNTER — Encounter (HOSPITAL_COMMUNITY): Payer: Self-pay | Admitting: Cardiology

## 2020-12-06 VITALS — BP 132/78 | HR 71 | Wt 173.2 lb

## 2020-12-06 DIAGNOSIS — F909 Attention-deficit hyperactivity disorder, unspecified type: Secondary | ICD-10-CM | POA: Diagnosis not present

## 2020-12-06 DIAGNOSIS — I214 Non-ST elevation (NSTEMI) myocardial infarction: Secondary | ICD-10-CM | POA: Insufficient documentation

## 2020-12-06 DIAGNOSIS — I130 Hypertensive heart and chronic kidney disease with heart failure and stage 1 through stage 4 chronic kidney disease, or unspecified chronic kidney disease: Secondary | ICD-10-CM | POA: Diagnosis present

## 2020-12-06 DIAGNOSIS — N183 Chronic kidney disease, stage 3 unspecified: Secondary | ICD-10-CM | POA: Insufficient documentation

## 2020-12-06 DIAGNOSIS — G4733 Obstructive sleep apnea (adult) (pediatric): Secondary | ICD-10-CM | POA: Insufficient documentation

## 2020-12-06 DIAGNOSIS — Z7984 Long term (current) use of oral hypoglycemic drugs: Secondary | ICD-10-CM | POA: Diagnosis not present

## 2020-12-06 DIAGNOSIS — Z87891 Personal history of nicotine dependence: Secondary | ICD-10-CM | POA: Insufficient documentation

## 2020-12-06 DIAGNOSIS — Z955 Presence of coronary angioplasty implant and graft: Secondary | ICD-10-CM | POA: Insufficient documentation

## 2020-12-06 DIAGNOSIS — I5022 Chronic systolic (congestive) heart failure: Secondary | ICD-10-CM | POA: Diagnosis not present

## 2020-12-06 DIAGNOSIS — F32A Depression, unspecified: Secondary | ICD-10-CM | POA: Insufficient documentation

## 2020-12-06 DIAGNOSIS — Z7982 Long term (current) use of aspirin: Secondary | ICD-10-CM | POA: Insufficient documentation

## 2020-12-06 DIAGNOSIS — I2510 Atherosclerotic heart disease of native coronary artery without angina pectoris: Secondary | ICD-10-CM | POA: Insufficient documentation

## 2020-12-06 LAB — LIPID PANEL
Cholesterol: 141 mg/dL (ref 0–200)
HDL: 47 mg/dL (ref >40)
LDL Cholesterol: 81 mg/dL (ref 0–99)
Total CHOL/HDL Ratio: 3 RATIO
Triglycerides: 66 mg/dL (ref <150)
VLDL: 13 mg/dL (ref 0–40)

## 2020-12-06 LAB — BASIC METABOLIC PANEL
Anion gap: 7 (ref 5–15)
BUN: 17 mg/dL (ref 8–23)
CO2: 25 mmol/L (ref 22–32)
Calcium: 8.9 mg/dL (ref 8.9–10.3)
Chloride: 103 mmol/L (ref 98–111)
Creatinine, Ser: 1.29 mg/dL — ABNORMAL HIGH (ref 0.61–1.24)
GFR, Estimated: >60 mL/min (ref >60)
Glucose, Bld: 98 mg/dL (ref 70–99)
Potassium: 4.5 mmol/L (ref 3.5–5.1)
Sodium: 135 mmol/L (ref 135–145)

## 2020-12-06 LAB — HEPATIC FUNCTION PANEL
ALT: 24 U/L (ref 0–44)
AST: 26 U/L (ref 15–41)
Albumin: 3.8 g/dL (ref 3.5–5.0)
Alkaline Phosphatase: 65 U/L (ref 38–126)
Bilirubin, Direct: 0.3 mg/dL — ABNORMAL HIGH (ref 0.0–0.2)
Indirect Bilirubin: 0.8 mg/dL (ref 0.3–0.9)
Total Bilirubin: 1.1 mg/dL (ref 0.3–1.2)
Total Protein: 6.7 g/dL (ref 6.5–8.1)

## 2020-12-06 MED ORDER — SPIRONOLACTONE 25 MG PO TABS: 12.5000 mg | ORAL_TABLET | Freq: Every day | ORAL | 3 refills | Status: DC

## 2020-12-06 MED ORDER — CARVEDILOL 6.25 MG PO TABS: 9.3750 mg | ORAL_TABLET | Freq: Two times a day (BID) | ORAL | 2 refills | Status: DC

## 2020-12-06 NOTE — Progress Notes (Signed)
Daily Session Note  Patient Details  Name: Justin Carroll MRN: [id number] Date of Birth: [birth year] Referring Provider:   Flowsheet Row CARDIAC REHAB PHASE II ORIENTATION from 11/10/2020 in [place]  Referring Provider Dr. Gardiner Rhyme       Encounter Date: 12/06/2020  Check In:  Session Check In - 12/06/20 0815       Check-In   Supervising physician immediately available to respond to emergencies [facility] MD immediately available    Physician(s) Dr. Audie Box    Location [facility]    Staff Present Hoy Register, MS, ACSM-CEP, Exercise Physiologist;Debra Wynetta Emery, RN, BSN    Virtual Visit No    Medication changes reported     No    Fall or balance concerns reported    No    Tobacco Cessation No Change    Warm-up and Cool-down Performed as group-led instruction    Resistance Training Performed Yes    VAD Patient? No    PAD/SET Patient? No      Pain Assessment   Currently in Pain? No/denies    Pain Score 0-No pain    Multiple Pain Sites No             Capillary Blood Glucose: No results found for this or any previous visit (from the past 24 hour(s)).    Social History   Tobacco Use  Smoking Status Former   Packs/day: 1.50   Years: 10.00   Pack years: 15.00   Types: Cigarettes   Start date: 1995   Quit date: 2015   Years since quitting: 7.8  Smokeless Tobacco Never    Goals Met:  Independence with exercise equipment Exercise tolerated well No report of concerns or symptoms today Strength training completed today  Goals Unmet:  Not Applicable  Comments: checkout time is 0915   Dr. Kathie Dike is Medical Director for [facility] Pulmonary Rehab.

## 2020-12-06 NOTE — Patient Instructions (Signed)
Increase Carvedilol to 9.375 mg (1 & 1/2 tabs) Twice daily   Start Spironolactone 12.5 mg (1/2 tab) Daily  Labs done today, your results will be available in MyChart, we will contact you for abnormal readings.  Your physician recommends that you return for lab work in: 1-2 weeks  Please follow up with our heart failure pharmacist in 3 weeks  Your physician recommends that you schedule a follow-up appointment in: 2 months with an echocardiogram  Do the following things EVERYDAY: Weigh yourself in the morning before breakfast. Write it down and keep it in a log. Take your medicines as prescribed Eat low salt foods--Limit salt (sodium) to 2000 mg per day.  Stay as active as you can everyday Limit all fluids for the day to less than 2 liters  If you have any questions or concerns before your next appointment please send [place] a message through [place] or call our office at [phone].    TO LEAVE A MESSAGE FOR THE NURSE SELECT OPTION 2, PLEASE LEAVE A MESSAGE INCLUDING: YOUR NAME DATE OF BIRTH CALL BACK NUMBER REASON FOR CALL**this is important as we prioritize the call backs  YOU WILL RECEIVE A CALL BACK THE SAME DAY AS LONG AS YOU CALL BEFORE 4:00 PM  At the [facility], you and your health needs are our priority. As part of our continuing mission to provide you with exceptional heart care, we have created designated Provider Care Teams. These Care Teams include your primary Cardiologist (physician) and Advanced Practice Providers (APPs- Physician Assistants and Nurse Practitioners) who all work together to provide you with the care you need, when you need it.   You may see any of the following providers on your designated Care Team at your next follow up: Dr Glori Bickers Dr Haynes Kerns, NP Lyda Jester, [place] [facility] [place], [place] Audry Riles, PharmD   Please be sure to bring in all your medications bottles to every  appointment.

## 2020-12-06 NOTE — Progress Notes (Signed)
I have reviewed a Home Exercise Prescription with Justin Carroll . Justin Carroll is currently exercising at home.  The patient was advised to walk 2-4 days a week for 30-45 minutes.  Justin Carroll and I discussed how to progress their exercise prescription.  The patient stated that their goals were to continue to continue to gain stamina.  The patient stated that they understand the exercise prescription.  We reviewed exercise guidelines, target heart rate during exercise, RPE Scale, weather conditions, NTG use, endpoints for exercise, warmup and cool down.  Patient is encouraged to come to me with any questions. I will continue to follow up with the patient to assist them with progression and safety.

## 2020-12-06 NOTE — Progress Notes (Signed)
Primary Care: Former patient of Dr Zonia Kief [place] Primary Cardiologist: Dr Gwenlyn Found  Neurology: Dr Dohmeier  HF Cardiology: Dr Aundra Dubin   Mr Taite is a 65 y.o. with a history of ADHD, depression, mild cognitive impairment vs adult ADHD, former heavy drinker (has not had alcohol in > 30 years), CAD s/p NSTEMI, and ischemic cardiomyopathy who was referred to CHF clinic from [facility] clinic.   Presented to [facility] 10/18/20 with chest pain. HS trop elevated. Had NSTEMI and underwent cath that showed severe one vessel disease mid left circumflex treated with DES.  Plan dual antiplatelet therapy x 12 months. [name] was added but had AKI so stopped prior to discharge. Prior to admit he was taking 20 mg Adderall everyday with an extra 20 mg 4-5 times a week. Adderall was stopped during hospitalization.  Echo showed EF 35-40%, normal RV.   He presents for followup of CHF and CAD.  He seems to be doing well symptomatically.  He is going to cardiac rehab.  Weight down 4 lbs.  No significant exertional dyspnea and no chest pain.  His stamina is not as good as before the MI.  He has sleep apnea and is waiting to get CPAP.    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 He has 7 children, several were adopted. Prior smoker, quit 2015.  Prior heavy ETOH, quit in 1980s.    FH: Father and uncles with MIs in 98s.   PMH: 1. OSA: Waiting for CPAP.  2. Adult ADHD 3. Depression 4. Allergic rhinitis 5. Hyperlipidemia 6. HTN 7. CKD stage 3 8. CAD: NSTEMI 9/22 with 85% pLCs stenosis on cath, treated with DES.  9. Chronic systolic CHF: Ischemic (versus mixed ischemic/nonischemic) cardiomyopathy.   - Echo (9/22): EF 35-40%, normal RV.   Review of Systems: All systems reviewed and negative except as per HPI.   Current Outpatient Medications  Medication Sig Dispense Refill    aspirin 81 MG EC tablet Take 1 tablet (81 mg total) by mouth daily. Swallow whole. 90 tablet 3   atorvastatin (LIPITOR) 80 MG tablet Take 1 tablet (80 mg total) by mouth daily. 90 tablet 3   Azelastine HCl 137 MCG/SPRAY SOLN USE 2 SPRAY(S) IN EACH NOSTRIL TWICE DAILY AS NEEDED FOR RHINITIS 30 mL 2   buPROPion (WELLBUTRIN XL) 300 MG 24 hr tablet Take 1 tablet by mouth once daily 90 tablet 0   Cholecalciferol (VITAMIN D-3 PO) Take 2,000 Units by mouth daily.     dapagliflozin propanediol (FARXIGA) 10 MG TABS tablet Take 1 tablet (10 mg total) by mouth daily. 30 tablet 11   levocetirizine (XYZAL) 5 MG tablet Take 5 mg by mouth every evening.     LORazepam (ATIVAN) 0.5 MG tablet Take 1 tablet (0.5 mg total) by mouth daily as needed for anxiety. 7 tablet 0   mometasone (NASONEX) 50 MCG/ACT nasal spray Place 1 spray into the nose daily. 17 g 5   nitroGLYCERIN (NITROSTAT) 0.4 MG SL tablet Place 1 tablet (0.4 mg total) under the tongue every 5 (five) minutes x 3 doses as needed for chest pain. 25 tablet 1   pantoprazole (PROTONIX) 40 MG tablet Take 1 tablet by mouth once daily 90 tablet 0   sacubitril-valsartan (ENTRESTO) 24-26 MG Take 1 tablet by mouth 2 (two) times daily. 60 tablet 3   sertraline (ZOLOFT)  25 MG tablet Take 0.5 tablets (12.5 mg total) by mouth at bedtime. 15 tablet 1   spironolactone (ALDACTONE) 25 MG tablet Take 0.5 tablets (12.5 mg total) by mouth daily. 15 tablet 3   tamsulosin (FLOMAX) 0.4 MG CAPS capsule TAKE 1 CAPSULE BY MOUTH ONCE DAILY AFTER SUPPER 90 capsule 0   testosterone cypionate (DEPOTESTOSTERONE CYPIONATE) 200 MG/ML injection INJECT 0.5 MLS INTO THE MUSCLE TWICE A WEEK. (Patient taking differently: Inject 100 mg into the muscle 2 (two) times a week.) 10 mL 2   ticagrelor (BRILINTA) 90 MG TABS tablet Take 1 tablet (90 mg total) by mouth 2 (two) times daily. 60 tablet 11   traZODone (DESYREL) 100 MG tablet TAKE 1 TABLET BY MOUTH AT BEDTIME 90 tablet 0   carvedilol (COREG)  6.25 MG tablet Take 1.5 tablets (9.375 mg total) by mouth 2 (two) times daily with a meal. 90 tablet 2   No current facility-administered medications for this encounter.    No Known Allergies   Vitals:   12/06/20 1154  BP: 132/78  Pulse: 71  SpO2: 95%  Weight: 78.6 kg (173 lb 3.2 oz)   Wt Readings from Last 3 Encounters:  12/06/20 78.6 kg (173 lb 3.2 oz)  11/27/20 79.2 kg (174 lb 9.7 oz)  11/13/20 79.7 kg (175 lb 11.3 oz)    PHYSICAL EXAM: General: NAD Neck: No JVD, no thyromegaly or thyroid nodule.  Lungs: Clear to auscultation bilaterally with normal respiratory effort. CV: Nondisplaced PMI.  Heart regular S1/S2, no S3/S4, no murmur.  No peripheral edema.  No carotid bruit.  Normal pedal pulses.  Abdomen: Soft, nontender, no hepatosplenomegaly, no distention.  Skin: Intact without lesions or rashes.  Neurologic: Alert and oriented x 3.  Psych: Normal affect. Extremities: No clubbing or cyanosis.  HEENT: Normal.   ASSESSMENT & PLAN: 1. 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  Echo in 9/22 with EF 35-40%. LHC showed single vessel CAD involving mid left circumflex. Cardiomyopathy seems out of proportion to CAD.  NYHA class II symptoms, not volume overloaded on exam. - Increase Coreg to 9.375 mg bid.   - Continue farxiga 10 mg daily.  - Continue Entresto 24/26 bid.   - Add spironolactone 12.5 mg daily, BMET today and in 10 days.  - Repeat echo at followup in 2 months.  If EF remains low, consider cardiac MRI.  2. CAD: NSTEMI 9/22, DES to pLCx.  No chest pain.  - Continue ASA 81 daily and ticagrelor 90 bid.  - Continue atorvastatin, check lipids today.  3. CKD: Stage 3.  BMET today.  4. ADHD: Off Adderall for now.  5. Depression: He has started on sertraline 12.5 mg daily, I will increase to 25 mg daily.  6. OSA: Waiting for CPAP.   Followup in 2 months with echo.   Loralie Champagne 12/06/2020

## 2020-12-07 ENCOUNTER — Telehealth (HOSPITAL_COMMUNITY): Payer: Self-pay | Admitting: Surgery

## 2020-12-07 ENCOUNTER — Other Ambulatory Visit (HOSPITAL_COMMUNITY): Payer: Self-pay | Admitting: *Deleted

## 2020-12-07 DIAGNOSIS — E785 Hyperlipidemia, unspecified: Secondary | ICD-10-CM

## 2020-12-07 MED ORDER — SERTRALINE HCL 25 MG PO TABS: 25.0000 mg | ORAL_TABLET | Freq: Every day | ORAL | 0 refills | Status: DC

## 2020-12-07 NOTE — Telephone Encounter (Signed)
Patient contacted and made aware of results and recommendations per Dr. Aundra Dubin. Ambulatory referral placed for Lipid clinic for initiation of Repatha.

## 2020-12-07 NOTE — Telephone Encounter (Signed)
-----   Message from Larey Dresser, MD sent at 12/07/2020 12:41 PM EDT ----- LDL is too high.  Ideally needs < 55.  Refer to pharmacy clinic for [place].

## 2020-12-08 ENCOUNTER — Encounter (HOSPITAL_COMMUNITY)
Admission: RE | Admit: 2020-12-08 | Discharge: 2020-12-08 | Disposition: A | Discharge status: Home / Self Care | Payer: Medicare Other | Source: Ambulatory Visit | Attending: Cardiovascular Disease | Admitting: Cardiovascular Disease

## 2020-12-08 DIAGNOSIS — Z955 Presence of coronary angioplasty implant and graft: Secondary | ICD-10-CM | POA: Diagnosis present

## 2020-12-08 DIAGNOSIS — I214 Non-ST elevation (NSTEMI) myocardial infarction: Secondary | ICD-10-CM

## 2020-12-08 NOTE — Progress Notes (Signed)
Daily Session Note  Patient Details  Name: Justin Carroll MRN: [id number] Date of Birth: [birth year] Referring Provider:   Flowsheet Row CARDIAC REHAB PHASE II ORIENTATION from 11/10/2020 in [facility]  Referring Provider Dr. Gardiner Rhyme       Encounter Date: 12/08/2020  Check In:  Session Check In - 12/08/20 0815       Check-In   Supervising physician immediately available to respond to emergencies [facility] MD immediately available    Physician(s) Dr. Domenic Polite    Location [facility]    Staff Present Hoy Register, MS, ACSM-CEP, Exercise Physiologist;Debra Wynetta Emery, RN, BSN    Virtual Visit No    Medication changes reported     No    Fall or balance concerns reported    No    Tobacco Cessation No Change    Warm-up and Cool-down Performed as group-led instruction    Resistance Training Performed Yes    VAD Patient? No    PAD/SET Patient? No      Pain Assessment   Currently in Pain? No/denies    Pain Score 0-No pain    Multiple Pain Sites No             Capillary Blood Glucose: No results found for this or any previous visit (from the past 24 hour(s)).    Social History   Tobacco Use  Smoking Status Former   Packs/day: 1.50   Years: 10.00   Pack years: 15.00   Types: Cigarettes   Start date: 1995   Quit date: 2015   Years since quitting: 7.8  Smokeless Tobacco Never    Goals Met:  Independence with exercise equipment Exercise tolerated well No report of concerns or symptoms today Strength training completed today  Goals Unmet:  Not Applicable  Comments: checkout time is 0915   Dr. Kathie Dike is Medical Director for [facility].

## 2020-12-11 ENCOUNTER — Encounter (HOSPITAL_COMMUNITY)
Admission: RE | Admit: 2020-12-11 | Discharge: 2020-12-11 | Disposition: A | Discharge status: Home / Self Care | Payer: Medicare Other | Source: Ambulatory Visit | Attending: Cardiovascular Disease | Admitting: Cardiovascular Disease

## 2020-12-11 VITALS — Wt 173.5 lb

## 2020-12-11 DIAGNOSIS — I214 Non-ST elevation (NSTEMI) myocardial infarction: Secondary | ICD-10-CM | POA: Diagnosis not present

## 2020-12-11 DIAGNOSIS — Z955 Presence of coronary angioplasty implant and graft: Secondary | ICD-10-CM

## 2020-12-11 NOTE — Progress Notes (Signed)
Daily Session Note  Patient Details  Name: Justin Carroll MRN: [id number] Date of Birth: Jun 13, 1955 Referring Provider:   Flowsheet Row CARDIAC REHAB PHASE II ORIENTATION from 11/10/2020 in [facility]  Referring Provider Dr. Gardiner Rhyme       Encounter Date: 12/11/2020  Check In:  Session Check In - 12/11/20 0815       Check-In   Supervising physician immediately available to respond to emergencies [facility] MD immediately available    Physician(s) Dr. Gasper Sells    Location [facility]    Staff Present Hoy Register, MS, ACSM-CEP, Exercise Physiologist;Jenica Costilow Zigmund Daniel, Exercise Physiologist    Virtual Visit No    Medication changes reported     No    Fall or balance concerns reported    No    Tobacco Cessation No Change    Warm-up and Cool-down Performed as group-led instruction    Resistance Training Performed Yes    VAD Patient? No    PAD/SET Patient? No      Pain Assessment   Currently in Pain? No/denies    Pain Score 0-No pain    Multiple Pain Sites No             Capillary Blood Glucose: No results found for this or any previous visit (from the past 24 hour(s)).    Social History   Tobacco Use  Smoking Status Former   Packs/day: 1.50   Years: 10.00   Pack years: 15.00   Types: Cigarettes   Start date: 1995   Quit date: 2015   Years since quitting: 7.8  Smokeless Tobacco Never    Goals Met:  Independence with exercise equipment Exercise tolerated well No report of concerns or symptoms today Strength training completed today  Goals Unmet:  Not Applicable  Comments: check out 0915   Dr. Kathie Dike is Medical Director for [facility].

## 2020-12-13 ENCOUNTER — Encounter (HOSPITAL_COMMUNITY): Payer: Medicare Other

## 2020-12-13 ENCOUNTER — Encounter (HOSPITAL_COMMUNITY)
Admission: RE | Admit: 2020-12-13 | Discharge: 2020-12-13 | Disposition: A | Discharge status: Home / Self Care | Payer: Medicare Other | Source: Ambulatory Visit | Attending: Cardiovascular Disease | Admitting: Cardiovascular Disease

## 2020-12-13 DIAGNOSIS — Z955 Presence of coronary angioplasty implant and graft: Secondary | ICD-10-CM

## 2020-12-13 DIAGNOSIS — I214 Non-ST elevation (NSTEMI) myocardial infarction: Secondary | ICD-10-CM

## 2020-12-13 NOTE — Progress Notes (Signed)
Daily Session Note  Patient Details  Name: Justin Carroll MRN: [id number] Date of Birth: 16-Oct-1955 Referring Provider:   Flowsheet Row CARDIAC REHAB PHASE II ORIENTATION from 11/10/2020 in [facility]  Referring Provider Dr. Gardiner Rhyme       Encounter Date: 12/13/2020  Check In:  Session Check In - 12/13/20 0815       Check-In   Supervising physician immediately available to respond to emergencies [facility] MD immediately available    Physician(s) Dr. Radford Pax    Location [facility]    Staff Present Hoy Register, MS, ACSM-CEP, Exercise Physiologist;Heather Otho Ket, BS, Exercise Physiologist;Debra Wynetta Emery, RN, BSN    Virtual Visit No    Medication changes reported     No    Fall or balance concerns reported    No    Tobacco Cessation No Change    Warm-up and Cool-down Performed as group-led instruction    Resistance Training Performed Yes    VAD Patient? No    PAD/SET Patient? No      Pain Assessment   Currently in Pain? No/denies    Pain Score 0-No pain    Multiple Pain Sites No             Capillary Blood Glucose: No results found for this or any previous visit (from the past 24 hour(s)).    Social History   Tobacco Use  Smoking Status Former   Packs/day: 1.50   Years: 10.00   Pack years: 15.00   Types: Cigarettes   Start date: 1995   Quit date: 2015   Years since quitting: 7.8  Smokeless Tobacco Never    Goals Met:  Independence with exercise equipment Exercise tolerated well No report of concerns or symptoms today Strength training completed today  Goals Unmet:  Not Applicable  Comments: checkout time is 0915   Dr. Kathie Dike is Medical Director for [facility].

## 2020-12-13 NOTE — Progress Notes (Signed)
Cardiac Individual Treatment Plan  Patient Details  Name: Justin Carroll MRN: [id number] Date of Birth: 04-21-1955 Referring Provider:   Flowsheet Row CARDIAC REHAB PHASE II ORIENTATION from 11/10/2020 in [place]  Referring Provider Dr. Gardiner Rhyme       Initial Encounter Date:  Flowsheet Row CARDIAC REHAB PHASE II ORIENTATION from 11/10/2020 in [place]  Date 11/10/20       Visit Diagnosis: NSTEMI (non-ST elevated myocardial infarction) [facility])  Status post coronary artery stent placement  Patient's Home Medications on Admission:  Current Outpatient Medications:    aspirin 81 MG EC tablet, Take 1 tablet (81 mg total) by mouth daily. Swallow whole., Disp: 90 tablet, Rfl: 3   atorvastatin (LIPITOR) 80 MG tablet, Take 1 tablet (80 mg total) by mouth daily., Disp: 90 tablet, Rfl: 3   Azelastine HCl 137 MCG/SPRAY SOLN, USE 2 SPRAY(S) IN EACH NOSTRIL TWICE DAILY AS NEEDED FOR RHINITIS, Disp: 30 mL, Rfl: 2   buPROPion (WELLBUTRIN XL) 300 MG 24 hr tablet, Take 1 tablet by mouth once daily, Disp: 90 tablet, Rfl: 0   carvedilol (COREG) 6.25 MG tablet, Take 1.5 tablets (9.375 mg total) by mouth 2 (two) times daily with a meal., Disp: 90 tablet, Rfl: 2   Cholecalciferol (VITAMIN D-3 PO), Take 2,000 Units by mouth daily., Disp: , Rfl:    dapagliflozin propanediol (FARXIGA) 10 MG TABS tablet, Take 1 tablet (10 mg total) by mouth daily., Disp: 30 tablet, Rfl: 11   levocetirizine (XYZAL) 5 MG tablet, Take 5 mg by mouth every evening., Disp: , Rfl:    LORazepam (ATIVAN) 0.5 MG tablet, Take 1 tablet (0.5 mg total) by mouth daily as needed for anxiety., Disp: 7 tablet, Rfl: 0   mometasone (NASONEX) 50 MCG/ACT nasal spray, Place 1 spray into the nose daily., Disp: 17 g, Rfl: 5   nitroGLYCERIN (NITROSTAT) 0.4 MG SL tablet, Place 1 tablet (0.4 mg total) under the tongue every 5 (five) minutes x 3 doses as needed for chest pain., Disp: 25 tablet, Rfl: 1    pantoprazole (PROTONIX) 40 MG tablet, Take 1 tablet by mouth once daily, Disp: 90 tablet, Rfl: 0   sacubitril-valsartan (ENTRESTO) 24-26 MG, Take 1 tablet by mouth 2 (two) times daily., Disp: 60 tablet, Rfl: 3   sertraline (ZOLOFT) 25 MG tablet, Take 1 tablet (25 mg total) by mouth at bedtime., Disp: 30 tablet, Rfl: 0   spironolactone (ALDACTONE) 25 MG tablet, Take 0.5 tablets (12.5 mg total) by mouth daily., Disp: 15 tablet, Rfl: 3   tamsulosin (FLOMAX) 0.4 MG CAPS capsule, TAKE 1 CAPSULE BY MOUTH ONCE DAILY AFTER SUPPER, Disp: 90 capsule, Rfl: 0   testosterone cypionate (DEPOTESTOSTERONE CYPIONATE) 200 MG/ML injection, INJECT 0.5 MLS INTO THE MUSCLE TWICE A WEEK. (Patient taking differently: Inject 100 mg into the muscle 2 (two) times a week.), Disp: 10 mL, Rfl: 2   ticagrelor (BRILINTA) 90 MG TABS tablet, Take 1 tablet (90 mg total) by mouth 2 (two) times daily., Disp: 60 tablet, Rfl: 11   traZODone (DESYREL) 100 MG tablet, TAKE 1 TABLET BY MOUTH AT BEDTIME, Disp: 90 tablet, Rfl: 0  Past Medical History: Past Medical History:  Diagnosis Date   Depression    Sinus infection    yearly    Tobacco Use: Social History   Tobacco Use  Smoking Status Former   Packs/day: 1.50   Years: 10.00   Pack years: 15.00   Types: Cigarettes   Start date: 1995  Quit date: 2015   Years since quitting: 7.8  Smokeless Tobacco Never    Labs: Recent Review Flowsheet Data     Labs for ITP Cardiac and Pulmonary Rehab Latest Ref Rng & Units 10/18/2020 12/06/2020   Cholestrol 0 - 200 mg/dL 191 141   LDLCALC 0 - 99 mg/dL 106(H) 81   HDL >40 mg/dL 61 47   Trlycerides <150 mg/dL 120 66   Hemoglobin A1c 4.8 - 5.6 % 6.3(H) -       Capillary Blood Glucose: Lab Results  Component Value Date   GLUCAP 127 (H) 10/20/2020   GLUCAP 90 10/20/2020   GLUCAP 104 (H) 10/19/2020   GLUCAP 84 10/19/2020   GLUCAP 124 (H) 10/19/2020     Exercise Target Goals: Exercise Program Goal: Individual exercise  prescription set using results from initial 6 min walk test and THRR while considering  patient's activity barriers and safety.   Exercise Prescription Goal: Starting with aerobic activity 30 plus minutes a day, 3 days per week for initial exercise prescription. Provide home exercise prescription and guidelines that participant acknowledges understanding prior to discharge.  Activity Barriers & Risk Stratification:  Activity Barriers & Cardiac Risk Stratification - 11/10/20 1307       Activity Barriers & Cardiac Risk Stratification   Cardiac Risk Stratification High             6 Minute Walk:  6 Minute Walk     Row Name 11/10/20 1402         6 Minute Walk   Phase Initial     Distance 1500 feet     Walk Time 6 minutes     # of Rest Breaks 0     MPH 2.8     METS 3.17     RPE 12     VO2 Peak 11.12     Symptoms No     Resting HR 76 bpm     Resting BP 104/56     Resting Oxygen Saturation  96 %     Exercise Oxygen Saturation  during 6 min walk 95 %     Max Ex. HR 82 bpm     Max Ex. BP 120/60     2 Minute Post BP 100/58              Oxygen Initial Assessment:   Oxygen Re-Evaluation:   Oxygen Discharge (Final Oxygen Re-Evaluation):   Initial Exercise Prescription:  Initial Exercise Prescription - 11/10/20 1400       Date of Initial Exercise RX and Referring Provider   Date 11/10/20    Referring Provider Dr. Gardiner Rhyme    Expected Discharge Date 02/02/21      Treadmill   MPH 2.3    Grade 0    Minutes 22    METs 0      Recumbant Elliptical   Level 1    RPM 60    Minutes 17    METs 0      Prescription Details   Frequency (times per week) 3    Duration Progress to 30 minutes of continuous aerobic without signs/symptoms of physical distress      Intensity   THRR 40-80% of Max Heartrate 61-122    Ratings of Perceived Exertion 11-15    Perceived Dyspnea 0-4      Progression   Progression Continue to progress workloads to maintain intensity  without signs/symptoms of physical distress.      Horticulturist, commercial  Prescription Yes    Weight 4    Reps 10-15             Perform Capillary Blood Glucose checks as needed.  Exercise Prescription Changes:   Exercise Prescription Changes     Row Name 11/13/20 1200 11/27/20 1200 12/06/20 0800 12/11/20 1200       Response to Exercise   Blood Pressure (Admit) 114/68 122/70 -- 110/60    Blood Pressure (Exercise) 144/74 112/52 -- 130/60    Blood Pressure (Exit) 116/70 108/68 -- 108/60    Heart Rate (Admit) 68 bpm 74 bpm -- 101 bpm    Heart Rate (Exercise) 87 bpm 92 bpm -- 97 bpm    Heart Rate (Exit) 77 bpm 83 bpm -- 88 bpm    Rating of Perceived Exertion (Exercise) 12 12 -- 11    Duration Continue with 30 min of aerobic exercise without signs/symptoms of physical distress. Continue with 30 min of aerobic exercise without signs/symptoms of physical distress. -- Continue with 30 min of aerobic exercise without signs/symptoms of physical distress.    Intensity THRR unchanged THRR unchanged -- THRR unchanged      Progression   Progression Continue to progress workloads to maintain intensity without signs/symptoms of physical distress. Continue to progress workloads to maintain intensity without signs/symptoms of physical distress. -- Continue to progress workloads to maintain intensity without signs/symptoms of physical distress.      Resistance Training   Training Prescription Yes Yes -- Yes    Weight 4 5 lbs -- 5    Reps 10-15 10-15 -- 10-15    Time 10 Minutes 10 Minutes -- 10 Minutes      Treadmill   MPH -- 2.3 -- 2.3    Grade -- 0 -- 0    Minutes -- 17 -- 17    METs -- 2.76 -- 2.76      NuStep   Level 1 2 -- 2    SPM 93 106 -- 109    Minutes 22 22 -- 22    METs 2.2 2.7 -- 2.85      Recumbant Elliptical   Level 1 -- -- --    RPM 56 -- -- --    Minutes 17 -- -- --    METs 4.1 -- -- --      Home Exercise Plan   Plans to continue exercise at -- -- Home  (comment) --    Frequency -- -- Add 2 additional days to program exercise sessions. --    Initial Home Exercises Provided -- -- 12/06/20 --             Exercise Comments:   Exercise Comments     Row Name 12/06/20 0854           Exercise Comments home exercise reviewed                Exercise Goals and Review:   Exercise Goals     Row Name 11/10/20 1407 11/13/20 1242 12/11/20 1249         Exercise Goals   Increase Physical Activity Yes Yes Yes     Intervention Provide advice, education, support and counseling about physical activity/exercise needs.;Develop an individualized exercise prescription for aerobic and resistive training based on initial evaluation findings, risk stratification, comorbidities and participant's personal goals. Provide advice, education, support and counseling about physical activity/exercise needs.;Develop an individualized exercise prescription for aerobic and resistive training based on initial evaluation findings, risk stratification, comorbidities  and participant's personal goals. Provide advice, education, support and counseling about physical activity/exercise needs.;Develop an individualized exercise prescription for aerobic and resistive training based on initial evaluation findings, risk stratification, comorbidities and participant's personal goals.     Expected Outcomes Short Term: Attend rehab on a regular basis to increase amount of physical activity.;Long Term: Add in home exercise to make exercise part of routine and to increase amount of physical activity.;Long Term: Exercising regularly at least 3-5 days a week. Short Term: Attend rehab on a regular basis to increase amount of physical activity.;Long Term: Add in home exercise to make exercise part of routine and to increase amount of physical activity.;Long Term: Exercising regularly at least 3-5 days a week. Short Term: Attend rehab on a regular basis to increase amount of physical  activity.;Long Term: Add in home exercise to make exercise part of routine and to increase amount of physical activity.;Long Term: Exercising regularly at least 3-5 days a week.     Increase Strength and Stamina Yes Yes Yes     Intervention Provide advice, education, support and counseling about physical activity/exercise needs.;Develop an individualized exercise prescription for aerobic and resistive training based on initial evaluation findings, risk stratification, comorbidities and participant's personal goals. Provide advice, education, support and counseling about physical activity/exercise needs.;Develop an individualized exercise prescription for aerobic and resistive training based on initial evaluation findings, risk stratification, comorbidities and participant's personal goals. Provide advice, education, support and counseling about physical activity/exercise needs.;Develop an individualized exercise prescription for aerobic and resistive training based on initial evaluation findings, risk stratification, comorbidities and participant's personal goals.     Expected Outcomes Short Term: Increase workloads from initial exercise prescription for resistance, speed, and METs.;Short Term: Perform resistance training exercises routinely during rehab and add in resistance training at home;Long Term: Improve cardiorespiratory fitness, muscular endurance and strength as measured by increased METs and functional capacity (6MWT) Short Term: Increase workloads from initial exercise prescription for resistance, speed, and METs.;Short Term: Perform resistance training exercises routinely during rehab and add in resistance training at home;Long Term: Improve cardiorespiratory fitness, muscular endurance and strength as measured by increased METs and functional capacity (6MWT) Short Term: Increase workloads from initial exercise prescription for resistance, speed, and METs.;Short Term: Perform resistance training  exercises routinely during rehab and add in resistance training at home;Long Term: Improve cardiorespiratory fitness, muscular endurance and strength as measured by increased METs and functional capacity (6MWT)     Able to understand and use rate of perceived exertion (RPE) scale Yes Yes Yes     Intervention Provide education and explanation on how to use RPE scale Provide education and explanation on how to use RPE scale Provide education and explanation on how to use RPE scale     Expected Outcomes Short Term: Able to use RPE daily in rehab to express subjective intensity level;Long Term:  Able to use RPE to guide intensity level when exercising independently Short Term: Able to use RPE daily in rehab to express subjective intensity level;Long Term:  Able to use RPE to guide intensity level when exercising independently Short Term: Able to use RPE daily in rehab to express subjective intensity level;Long Term:  Able to use RPE to guide intensity level when exercising independently     Knowledge and understanding of Target Heart Rate Range (THRR) Yes Yes Yes     Intervention Provide education and explanation of THRR including how the numbers were predicted and where they are located for reference Provide education and explanation  of THRR including how the numbers were predicted and where they are located for reference Provide education and explanation of THRR including how the numbers were predicted and where they are located for reference     Expected Outcomes Short Term: Able to state/look up THRR;Short Term: Able to use daily as guideline for intensity in rehab;Long Term: Able to use THRR to govern intensity when exercising independently Short Term: Able to state/look up THRR;Short Term: Able to use daily as guideline for intensity in rehab;Long Term: Able to use THRR to govern intensity when exercising independently Short Term: Able to state/look up THRR;Short Term: Able to use daily as guideline for  intensity in rehab;Long Term: Able to use THRR to govern intensity when exercising independently     Able to check pulse independently Yes Yes Yes     Intervention Review the importance of being able to check your own pulse for safety during independent exercise;Provide education and demonstration on how to check pulse in carotid and radial arteries. Review the importance of being able to check your own pulse for safety during independent exercise;Provide education and demonstration on how to check pulse in carotid and radial arteries. Review the importance of being able to check your own pulse for safety during independent exercise;Provide education and demonstration on how to check pulse in carotid and radial arteries.     Expected Outcomes Short Term: Able to explain why pulse checking is important during independent exercise;Long Term: Able to check pulse independently and accurately Short Term: Able to explain why pulse checking is important during independent exercise;Long Term: Able to check pulse independently and accurately Short Term: Able to explain why pulse checking is important during independent exercise;Long Term: Able to check pulse independently and accurately     Understanding of Exercise Prescription Yes Yes Yes     Intervention Provide education, explanation, and written materials on patient's individual exercise prescription Provide education, explanation, and written materials on patient's individual exercise prescription Provide education, explanation, and written materials on patient's individual exercise prescription     Expected Outcomes Short Term: Able to explain program exercise prescription;Long Term: Able to explain home exercise prescription to exercise independently Short Term: Able to explain program exercise prescription;Long Term: Able to explain home exercise prescription to exercise independently Short Term: Able to explain program exercise prescription;Long Term: Able to  explain home exercise prescription to exercise independently              Exercise Goals Re-Evaluation :  Exercise Goals Re-Evaluation     [place] Name 11/13/20 1242 12/11/20 1250           Exercise Goal Re-Evaluation   Exercise Goals Review Increase Physical Activity;Increase Strength and Stamina;Able to understand and use rate of perceived exertion (RPE) scale;Knowledge and understanding of Target Heart Rate Range (THRR);Able to check pulse independently;Understanding of Exercise Prescription Increase Physical Activity;Increase Strength and Stamina;Able to understand and use rate of perceived exertion (RPE) scale;Knowledge and understanding of Target Heart Rate Range (THRR);Able to check pulse independently;Understanding of Exercise Prescription      Comments Pt attended 2 sessions of cardiac rehab. He is currently working at 4.1 METs of the Ellp. Will continue to montior and progress as able. Pt has completed 14 sessions of cardiac rehab. He is progressing himself in sessions and at home exercise. He is currently working at 2.85 METs in the stepper. Will continue to montior and progress as able.      Expected Outcomes Through exercise at rehab  and at home, the patient will meet their stated goals. Through exercise at rehab and at home, the patient will meet their stated goals.                Discharge Exercise Prescription (Final Exercise Prescription Changes):  Exercise Prescription Changes - 12/11/20 1200       Response to Exercise   Blood Pressure (Admit) 110/60    Blood Pressure (Exercise) 130/60    Blood Pressure (Exit) 108/60    Heart Rate (Admit) 101 bpm    Heart Rate (Exercise) 97 bpm    Heart Rate (Exit) 88 bpm    Rating of Perceived Exertion (Exercise) 11    Duration Continue with 30 min of aerobic exercise without signs/symptoms of physical distress.    Intensity THRR unchanged      Progression   Progression Continue to progress workloads to maintain intensity  without signs/symptoms of physical distress.      Resistance Training   Training Prescription Yes    Weight 5    Reps 10-15    Time 10 Minutes      Treadmill   MPH 2.3    Grade 0    Minutes 17    METs 2.76      NuStep   Level 2    SPM 109    Minutes 22    METs 2.85             Nutrition:  Target Goals: Understanding of nutrition guidelines, daily intake of sodium 1500mg , cholesterol 200mg , calories 30% from fat and 7% or less from saturated fats, daily to have 5 or more servings of fruits and vegetables.  Biometrics:  Pre Biometrics - 11/10/20 1408       Pre Biometrics   Height 5\' 7"  (1.702 m)    Weight 79.3 kg    Waist Circumference 41 inches    Hip Circumference 39 inches    Waist to Hip Ratio 1.05 %    BMI (Calculated) 27.37    Triceps Skinfold 8 mm    % Body Fat 25.5 %    Grip Strength 28.5 kg    Flexibility 4.5 in    Single Leg Stand 19 seconds              Nutrition Therapy Plan and Nutrition Goals:  Nutrition Therapy & Goals - 11/15/20 0715       Personal Nutrition Goals   Comments Patient scored 20 on his diet assessment. We offer 2 educational sessions on heart healthy nutrition with handouts and assistance with RD referral if patient is interested.      Intervention Plan   Intervention Nutrition handout(s) given to patient.    Expected Outcomes Short Term Goal: Understand basic principles of dietary content, such as calories, fat, sodium, cholesterol and nutrients.             Nutrition Assessments:  Nutrition Assessments - 11/10/20 1245       MEDFICTS Scores   Pre Score 20            MEDIFICTS Score Key: ?70 Need to make dietary changes  40-70 Heart Healthy Diet ? 40 Therapeutic Level Cholesterol Diet   Picture Your Plate Scores: <38 Unhealthy dietary pattern with much room for improvement. 41-50 Dietary pattern unlikely to meet recommendations for good health and room for improvement. 51-60 More healthful dietary  pattern, with some room for improvement.  >60 Healthy dietary pattern, although there may be some specific behaviors that  could be improved.    Nutrition Goals Re-Evaluation:   Nutrition Goals Discharge (Final Nutrition Goals Re-Evaluation):   Psychosocial: Target Goals: Acknowledge presence or absence of significant depression and/or stress, maximize coping skills, provide positive support system. Participant is able to verbalize types and ability to use techniques and skills needed for reducing stress and depression.  Initial Review & Psychosocial Screening:  Initial Psych Review & Screening - 11/10/20 1309       Initial Review   Current issues with Current Sleep Concerns;Current Depression;Current Anxiety/Panic   Takes zoloft for depression and ativan for anxiety. His sleep is altered but he was recently diagnosed with OSA and is waiting on his CPAP to arrive.     Family Dynamics   Good Support System? Yes    Comments His support system is his wife.      Barriers   Psychosocial barriers to participate in program The patient should benefit from training in stress management and relaxation.      Screening Interventions   Interventions Encouraged to exercise;To provide support and resources with identified psychosocial needs;Provide feedback about the scores to participant    Expected Outcomes Long Term Goal: Stressors or current issues are controlled or eliminated.;Short Term goal: Identification and review with participant of any Quality of Life or Depression concerns found by scoring the questionnaire.;Long Term goal: The participant improves quality of Life and PHQ9 Scores as seen by post scores and/or verbalization of changes             Quality of Life Scores:  Quality of Life - 11/10/20 1409       Quality of Life   Select Quality of Life      Quality of Life Scores   Health/Function Pre 15.6 %    Socioeconomic Pre 20.5 %    Psych/Spiritual Pre 21.43 %    Family  Pre 22.8 %    GLOBAL Pre 18.87 %            Scores of 19 and below usually indicate a poorer quality of life in these areas.  A difference of  2-3 points is a clinically meaningful difference.  A difference of 2-3 points in the total score of the Quality of Life Index has been associated with significant improvement in overall quality of life, self-image, physical symptoms, and general health in studies assessing change in quality of life.  PHQ-9: Recent Review Flowsheet Data     Depression screen [facility] 2/9 11/10/2020 05/17/2020   Decreased Interest 1 0   Down, Depressed, Hopeless 0 0   PHQ - 2 Score 1 0   Altered sleeping 1 0   Tired, decreased energy 2 0   Change in appetite 1 0   Feeling bad or failure about yourself  0 0   Trouble concentrating 1 0   Moving slowly or fidgety/restless 0 0   Suicidal thoughts 0 0   PHQ-9 Score 6 0   Difficult doing work/chores Very difficult Not difficult at all      Interpretation of Total Score  Total Score Depression Severity:  1-4 = Minimal depression, 5-9 = Mild depression, 10-14 = Moderate depression, 15-19 = Moderately severe depression, 20-27 = Severe depression   Psychosocial Evaluation and Intervention:  Psychosocial Evaluation - 11/10/20 1330       Psychosocial Evaluation & Interventions   Interventions Stress management education;Relaxation education;Encouraged to exercise with the program and follow exercise prescription    Comments Pt has no barriers to completing  cardiac rehab. He does have anxiety and depression. His anxiety is treated with ativan and his depression is treated with zoloft. He reports being unhappy lately due to his recent NSTEMI. 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 This is all very different to him since he is out of work for now. He also reports problems with his sleep, but he was recently diagnosed with OSA. He has ordered a CPAP and is waiting for it to come in. He reports  that he has a good support system from his wife. He reports that he has made several lifestyle changes since his NSTEMI such as cleaning up his diet. His goals while in the program are to gain his strength and stamina back. He is eager to start rehab and to use his time off of work to improve his health.    Expected Outcomes The patient will continue to take his medications for depression and anxiety and will have no further identifiable psychosocial issues.    Continue Psychosocial Services  No Follow up required             Psychosocial Re-Evaluation:  Psychosocial Re-Evaluation     [place] Name 11/15/20 0718 12/07/20 0747           Psychosocial Re-Evaluation   Current issues with Current Depression;Current Anxiety/Panic;Current Sleep Concerns Current Depression;Current Anxiety/Panic;Current Sleep Concerns      Comments Patient is new to the program completing 3 sessions. He continues to have no psychosocial barriers identified. His depression/anxiety and sleep all all managed with Zoloft, Lorazepam, Wellbutrin and Trazodone. We will continue to monitor. Patient has completed 12 sessions and continues to have no psychosocial barriers identified. He seems to enjoy coming to the sessions and demonstrates an interest in improving his health. He saw his cardiologist 12/06/20 at [facility]. Patient recently started on Sertraline 12.5 mg; MD titrated up to 25 mg daily. He is also on Lorazepam and Wellbutrin for managment of depression and anxiety and Trazodone for sleep management. We will continue to monitor his progress.      Expected Outcomes Patient will continue to have no psychosocial barriers identified. Patient will continue to have no psychosocial barriers identified.      Interventions Stress management education;Encouraged to attend Cardiac Rehabilitation for the exercise;Relaxation education Stress management education;Encouraged to attend Cardiac Rehabilitation for the exercise;Relaxation  education      Continue Psychosocial Services  No Follow up required No Follow up required               Psychosocial Discharge (Final Psychosocial Re-Evaluation):  Psychosocial Re-Evaluation - 12/07/20 0747       Psychosocial Re-Evaluation   Current issues with Current Depression;Current Anxiety/Panic;Current Sleep Concerns    Comments Patient has completed 12 sessions and continues to have no psychosocial barriers identified. He seems to enjoy coming to the sessions and demonstrates an interest in improving his health. He saw his cardiologist 12/06/20 at [facility]. Patient recently started on Sertraline 12.5 mg; MD titrated up to 25 mg daily. He is also on Lorazepam and Wellbutrin for managment of depression and anxiety and Trazodone for sleep management. We will continue to monitor his progress.    Expected Outcomes Patient will continue to have no psychosocial barriers identified.    Interventions Stress management education;Encouraged to attend Cardiac Rehabilitation for the exercise;Relaxation education    Continue Psychosocial Services  No Follow up required  Vocational Rehabilitation: Provide vocational rehab assistance to qualifying candidates.   Vocational Rehab Evaluation & Intervention:  Vocational Rehab - 11/10/20 1253       Initial Vocational Rehab Evaluation & Intervention   Assessment shows need for Vocational Rehabilitation No             Education: Education Goals: Education classes will be provided on a weekly basis, covering required topics. Participant will state understanding/return demonstration of topics presented.  Learning Barriers/Preferences:  Learning Barriers/Preferences - 11/10/20 1246       Learning Barriers/Preferences   Learning Barriers None    Learning Preferences Audio;Computer/Internet;Group Instruction;Individual Instruction;Skilled Demonstration;Pictoral;Verbal Instruction;Video;Written Material              Education Topics: Hypertension, Hypertension Reduction -Define heart disease and high blood pressure. Discus how high blood pressure affects the body and ways to reduce high blood pressure.   Exercise and Your Heart -Discuss why it is important to exercise, the FITT principles of exercise, normal and abnormal responses to exercise, and how to exercise safely. Flowsheet Row CARDIAC REHAB PHASE II EXERCISE from 12/06/2020 in [place]  Date 11/15/20  Educator DF  Instruction Review Code 2- Demonstrated Understanding       Angina -Discuss definition of angina, causes of angina, treatment of angina, and how to decrease risk of having angina. Flowsheet Row CARDIAC REHAB PHASE II EXERCISE from 12/06/2020 in [place]  Date 11/22/20  Educator DF  Instruction Review Code 2- Demonstrated Understanding       Cardiac Medications -Review what the following cardiac medications are used for, how they affect the body, and side effects that may occur when taking the medications.  Medications include Aspirin, Beta blockers, calcium channel blockers, ACE Inhibitors, angiotensin receptor blockers, diuretics, digoxin, and antihyperlipidemics. Flowsheet Row CARDIAC REHAB PHASE II EXERCISE from 12/06/2020 in [place]  Date 11/29/20  Educator pb  Instruction Review Code 1- Verbalizes Understanding       Congestive Heart Failure -Discuss the definition of CHF, how to live with CHF, the signs and symptoms of CHF, and how keep track of weight and sodium intake. Flowsheet Row CARDIAC REHAB PHASE II EXERCISE from 12/06/2020 in [place]  Date 12/06/20  Educator DF  Instruction Review Code 2- Demonstrated Understanding       Heart Disease and Intimacy -Discus the effect sexual activity has on the heart, how changes occur during intimacy as we age, and safety during sexual activity.   Smoking  Cessation / COPD -Discuss different methods to quit smoking, the health benefits of quitting smoking, and the definition of COPD.   Nutrition I: Fats -Discuss the types of cholesterol, what cholesterol does to the heart, and how cholesterol levels can be controlled.   Nutrition II: Labels -Discuss the different components of food labels and how to read food label   Heart Parts/Heart Disease and PAD -Discuss the anatomy of the heart, the pathway of blood circulation through the heart, and these are affected by heart disease.   Stress I: Signs and Symptoms -Discuss the causes of stress, how stress may lead to anxiety and depression, and ways to limit stress.   Stress II: Relaxation -Discuss different types of relaxation techniques to limit stress.   Warning Signs of Stroke / TIA -Discuss definition of a stroke, what the signs and symptoms are of a stroke, and how to identify when someone is having stroke.   Knowledge Questionnaire Score:  Knowledge Questionnaire  Score - 11/10/20 1246       Knowledge Questionnaire Score   Pre Score 23/24             Core Components/Risk Factors/Patient Goals at Admission:  Personal Goals and Risk Factors at Admission - 11/10/20 1321       Core Components/Risk Factors/Patient Goals on Admission    Weight Management Yes;Weight Maintenance    Intervention Weight Management: Provide education and appropriate resources to help participant work on and attain dietary goals.;Weight Management: Develop a combined nutrition and exercise program designed to reach desired caloric intake, while maintaining appropriate intake of nutrient and fiber, sodium and fats, and appropriate energy expenditure required for the weight goal.    Expected Outcomes Short Term: Continue to assess and modify interventions until short term weight is achieved;Long Term: Adherence to nutrition and physical activity/exercise program aimed toward attainment of established  weight goal;Weight Maintenance: Understanding of the daily nutrition guidelines, which includes 25-35% calories from fat, 7% or less cal from saturated fats, less than 200mg  cholesterol, less than 1.5gm of sodium, & 5 or more servings of fruits and vegetables daily    Improve shortness of breath with ADL's Yes    Intervention Provide education, individualized exercise plan and daily activity instruction to help decrease symptoms of SOB with activities of daily living.    Expected Outcomes Short Term: Improve cardiorespiratory fitness to achieve a reduction of symptoms when performing ADLs;Long Term: Be able to perform more ADLs without symptoms or delay the onset of symptoms    Heart Failure Yes    Intervention Provide a combined exercise and nutrition program that is supplemented with education, support and counseling about heart failure. Directed toward relieving symptoms such as shortness of breath, decreased exercise tolerance, and extremity edema.    Expected Outcomes Improve functional capacity of life;Short term: Attendance in program 2-3 days a week with increased exercise capacity. Reported lower sodium intake. Reported increased fruit and vegetable intake. Reports medication compliance.;Short term: Daily weights obtained and reported for increase. Utilizing diuretic protocols set by physician.;Long term: Adoption of self-care skills and reduction of barriers for early signs and symptoms recognition and intervention leading to self-care maintenance.             Core Components/Risk Factors/Patient Goals Review:   Goals and Risk Factor Review     Row Name 11/15/20 0717 12/07/20 0750           Core Components/Risk Factors/Patient Goals Review   Personal Goals Review Weight Management/Obesity;Heart Failure;Other Weight Management/Obesity;Heart Failure;Other      Review Patient was referred to CR with CHF. He has multiple risk factors for CAD and is participating in the program for risk  modification. He is new to the program completing 3 sessions. His personal goals for the program are to improve his strength and stamina. We will continue to monitor his program as he works towards meeting these goals. Patient has completed 12 sessions losing 2 lbs since his initial visit. He is doing well in the program with progressions and consistent attendance. He saw his cardiologist in [facility] 12/06/20 for newly diagnosed HF. He increased his Coreg to 9.375 mg BID and added Spironolactone 12.5 mg daily. Plans to follow up with labs and will repeat Echo in 2 months. If EF remains low will consider Cardiac MRI. Patient's blood pressure and heart rate is well controlled. He reported to his MD yesterday that he is not having any SOB with exertion. His personal goals for  the program are to improve his strength and stamina. He says he is not at the level he was prior to his MI but is improving. We will continue to monitor his progress as he works towards meeting these goals.      Expected Outcomes Patient will complete the program meeting both personal and program goals. Patient will complete the program meeting both personal and program goals.               Core Components/Risk Factors/Patient Goals at Discharge (Final Review):   Goals and Risk Factor Review - 12/07/20 0750       Core Components/Risk Factors/Patient Goals Review   Personal Goals Review Weight Management/Obesity;Heart Failure;Other    Review Patient has completed 12 sessions losing 2 lbs since his initial visit. He is doing well in the program with progressions and consistent attendance. He saw his cardiologist in [facility] 12/06/20 for newly diagnosed HF. He increased his Coreg to 9.375 mg BID and added Spironolactone 12.5 mg daily. Plans to follow up with labs and will repeat Echo in 2 months. If EF remains low will consider Cardiac MRI. Patient's blood pressure and heart rate is well controlled. He reported to his MD yesterday that  he is not having any SOB with exertion. His personal goals for the program are to improve his strength and stamina. He says he is not at the level he was prior to his MI but is improving. We will continue to monitor his progress as he works towards meeting these goals.    Expected Outcomes Patient will complete the program meeting both personal and program goals.             ITP Comments:   Comments: ITP REVIEW Pt is making expected progress toward Cardiac Rehab goals after completing 13 sessions. Recommend continued exercise, life style modification, education, and increased stamina and strength.

## 2020-12-15 ENCOUNTER — Encounter (HOSPITAL_COMMUNITY)
Admission: RE | Admit: 2020-12-15 | Discharge: 2020-12-15 | Disposition: A | Discharge status: Home / Self Care | Payer: Medicare Other | Source: Ambulatory Visit | Attending: Cardiovascular Disease | Admitting: Cardiovascular Disease

## 2020-12-15 DIAGNOSIS — Z955 Presence of coronary angioplasty implant and graft: Secondary | ICD-10-CM

## 2020-12-15 DIAGNOSIS — I214 Non-ST elevation (NSTEMI) myocardial infarction: Secondary | ICD-10-CM | POA: Diagnosis not present

## 2020-12-15 NOTE — Progress Notes (Signed)
Daily Session Note  Patient Details  Name: Justin Carroll MRN: [id number] Date of Birth: [birth year] Referring Provider:   Flowsheet Row CARDIAC REHAB PHASE II ORIENTATION from 11/10/2020 in [place]  Referring Provider Dr. Gardiner Rhyme       Encounter Date: 12/15/2020  Check In:  Session Check In - 12/15/20 0815       Check-In   Supervising physician immediately available to respond to emergencies [facility] MD immediately available    Physician(s) Dr. Harl Bowie    Location [facility]    Staff Present Hoy Register, MS, ACSM-CEP, Exercise Physiologist;Keisa Blow Zigmund Daniel, Exercise Physiologist    Virtual Visit No    Medication changes reported     No    Fall or balance concerns reported    No    Tobacco Cessation No Change    Warm-up and Cool-down Not performed (comment)   pt was late for warm-up   Resistance Training Performed No    VAD Patient? No    PAD/SET Patient? No      Pain Assessment   Currently in Pain? No/denies    Pain Score 0-No pain    Multiple Pain Sites No             Capillary Blood Glucose: No results found for this or any previous visit (from the past 24 hour(s)).    Social History   Tobacco Use  Smoking Status Former   Packs/day: 1.50   Years: 10.00   Pack years: 15.00   Types: Cigarettes   Start date: 1995   Quit date: 2015   Years since quitting: 7.8  Smokeless Tobacco Never    Goals Met:  Independence with exercise equipment Exercise tolerated well No report of concerns or symptoms today Strength training completed today  Goals Unmet:  Not Applicable  Comments: check out 0915   Dr. Kathie Dike is Medical Director for [facility] Pulmonary Rehab.

## 2020-12-18 ENCOUNTER — Encounter (HOSPITAL_COMMUNITY)
Admission: RE | Admit: 2020-12-18 | Discharge: 2020-12-18 | Disposition: A | Discharge status: Home / Self Care | Payer: Medicare Other | Source: Ambulatory Visit | Attending: Cardiovascular Disease | Admitting: Cardiovascular Disease

## 2020-12-18 DIAGNOSIS — I214 Non-ST elevation (NSTEMI) myocardial infarction: Secondary | ICD-10-CM | POA: Diagnosis not present

## 2020-12-18 DIAGNOSIS — Z955 Presence of coronary angioplasty implant and graft: Secondary | ICD-10-CM

## 2020-12-18 NOTE — Progress Notes (Signed)
Daily Session Note  Patient Details  Name: Justin Carroll MRN: [id number] Date of Birth: 1955-04-05 Referring Provider:   Flowsheet Row CARDIAC REHAB PHASE II ORIENTATION from 11/10/2020 in [place]  Referring Provider Dr. Gardiner Rhyme       Encounter Date: 12/18/2020  Check In:  Session Check In - 12/18/20 0815       Check-In   Supervising physician immediately available to respond to emergencies [facility] MD immediately available    Physician(s) Dr. Domenic Polite    Location [facility]    Staff Present Hoy Register, MS, ACSM-CEP, Exercise Physiologist;Alaena Strader Zigmund Daniel, Exercise Physiologist    Virtual Visit No    Medication changes reported     No    Fall or balance concerns reported    No    Tobacco Cessation No Change    Warm-up and Cool-down Performed as group-led instruction    Resistance Training Performed Yes    VAD Patient? No    PAD/SET Patient? No      Pain Assessment   Currently in Pain? No/denies    Pain Score 0-No pain    Multiple Pain Sites No             Capillary Blood Glucose: No results found for this or any previous visit (from the past 24 hour(s)).    Social History   Tobacco Use  Smoking Status Former   Packs/day: 1.50   Years: 10.00   Pack years: 15.00   Types: Cigarettes   Start date: 1995   Quit date: 2015   Years since quitting: 7.8  Smokeless Tobacco Never    Goals Met:  Independence with exercise equipment Exercise tolerated well No report of concerns or symptoms today Strength training completed today  Goals Unmet:  Not Applicable  Comments: check out 0915   Dr. Kathie Dike is Medical Director for [facility] Pulmonary Rehab.

## 2020-12-20 ENCOUNTER — Encounter (HOSPITAL_COMMUNITY)
Admission: RE | Admit: 2020-12-20 | Discharge: 2020-12-20 | Disposition: A | Discharge status: Home / Self Care | Payer: Medicare Other | Source: Ambulatory Visit | Attending: Cardiovascular Disease | Admitting: Cardiovascular Disease

## 2020-12-20 DIAGNOSIS — I214 Non-ST elevation (NSTEMI) myocardial infarction: Secondary | ICD-10-CM | POA: Diagnosis not present

## 2020-12-20 DIAGNOSIS — Z955 Presence of coronary angioplasty implant and graft: Secondary | ICD-10-CM

## 2020-12-20 NOTE — Progress Notes (Signed)
Daily Session Note  Patient Details  Name: Justin Carroll MRN: [id number] Date of Birth: August 09, 1955 Referring Provider:   Flowsheet Row CARDIAC REHAB PHASE II ORIENTATION from 11/10/2020 in [place]  Referring Provider Dr. Gardiner Rhyme       Encounter Date: 12/20/2020  Check In:  Session Check In - 12/20/20 0815       Check-In   Supervising physician immediately available to respond to emergencies [facility] MD immediately available    Physician(s) Dr. Domenic Polite    Location [facility]    Staff Present Hoy Register, MS, ACSM-CEP, Exercise Physiologist;Heather Zigmund Daniel, Exercise Physiologist    Virtual Visit No    Medication changes reported     No    Fall or balance concerns reported    No    Tobacco Cessation No Change    Warm-up and Cool-down Performed as group-led instruction    Resistance Training Performed Yes    VAD Patient? No    PAD/SET Patient? No      Pain Assessment   Currently in Pain? No/denies    Pain Score 0-No pain    Multiple Pain Sites No             Capillary Blood Glucose: No results found for this or any previous visit (from the past 24 hour(s)).    Social History   Tobacco Use  Smoking Status Former   Packs/day: 1.50   Years: 10.00   Pack years: 15.00   Types: Cigarettes   Start date: 1995   Quit date: 2015   Years since quitting: 7.8  Smokeless Tobacco Never    Goals Met:  Independence with exercise equipment Exercise tolerated well No report of concerns or symptoms today Strength training completed today  Goals Unmet:  Not Applicable  Comments: checkout time is 0915   Dr. Kathie Dike is Medical Director for [facility] Pulmonary Rehab.

## 2020-12-22 ENCOUNTER — Encounter (HOSPITAL_COMMUNITY)
Admission: RE | Admit: 2020-12-22 | Discharge: 2020-12-22 | Disposition: A | Discharge status: Home / Self Care | Payer: Medicare Other | Source: Ambulatory Visit | Attending: Cardiovascular Disease | Admitting: Cardiovascular Disease

## 2020-12-22 DIAGNOSIS — I214 Non-ST elevation (NSTEMI) myocardial infarction: Secondary | ICD-10-CM | POA: Diagnosis not present

## 2020-12-22 DIAGNOSIS — Z955 Presence of coronary angioplasty implant and graft: Secondary | ICD-10-CM

## 2020-12-22 NOTE — Progress Notes (Signed)
Daily Session Note  Patient Details  Name: Justin Carroll MRN: [id number] Date of Birth: [birth year] Referring Provider:   Flowsheet Row CARDIAC REHAB PHASE II ORIENTATION from 11/10/2020 in [place]  Referring Provider Dr. Gardiner Rhyme       Encounter Date: 12/22/2020  Check In:  Session Check In - 12/22/20 0815       Check-In   Physician(s) Dr. Domenic Polite    Location [facility]    Staff Present Hoy Register, MS, ACSM-CEP, Exercise Physiologist;Kristian Hazzard Zigmund Daniel, Exercise Physiologist    Virtual Visit No    Medication changes reported     No    Fall or balance concerns reported    No    Tobacco Cessation No Change    Warm-up and Cool-down Performed as group-led instruction    Resistance Training Performed Yes    VAD Patient? No    PAD/SET Patient? No      Pain Assessment   Currently in Pain? No/denies    Pain Score 0-No pain    Multiple Pain Sites No             Capillary Blood Glucose: No results found for this or any previous visit (from the past 24 hour(s)).    Social History   Tobacco Use  Smoking Status Former   Packs/day: 1.50   Years: 10.00   Pack years: 15.00   Types: Cigarettes   Start date: 1995   Quit date: 2015   Years since quitting: 7.8  Smokeless Tobacco Never    Goals Met:  Independence with exercise equipment Exercise tolerated well No report of concerns or symptoms today Strength training completed today  Goals Unmet:  Not Applicable  Comments: [id number]    Dr. Kathie Dike is Medical Director for [facility] Pulmonary Rehab.

## 2020-12-25 ENCOUNTER — Other Ambulatory Visit (HOSPITAL_COMMUNITY)
Admission: RE | Admit: 2020-12-25 | Discharge: 2020-12-25 | Disposition: A | Discharge status: Home / Self Care | Payer: Medicare Other | Source: Ambulatory Visit | Attending: Cardiology | Admitting: Cardiology

## 2020-12-25 ENCOUNTER — Telehealth: Payer: Self-pay | Admitting: General Practice

## 2020-12-25 ENCOUNTER — Other Ambulatory Visit: Payer: Self-pay

## 2020-12-25 ENCOUNTER — Encounter (HOSPITAL_COMMUNITY)
Admission: RE | Admit: 2020-12-25 | Discharge: 2020-12-25 | Disposition: A | Discharge status: Home / Self Care | Payer: Medicare Other | Source: Ambulatory Visit | Attending: Cardiovascular Disease | Admitting: Cardiovascular Disease

## 2020-12-25 VITALS — Wt 173.9 lb

## 2020-12-25 DIAGNOSIS — Z955 Presence of coronary angioplasty implant and graft: Secondary | ICD-10-CM

## 2020-12-25 DIAGNOSIS — I5022 Chronic systolic (congestive) heart failure: Secondary | ICD-10-CM | POA: Insufficient documentation

## 2020-12-25 DIAGNOSIS — I214 Non-ST elevation (NSTEMI) myocardial infarction: Secondary | ICD-10-CM

## 2020-12-25 LAB — BASIC METABOLIC PANEL
Anion gap: 5 (ref 5–15)
BUN: 23 mg/dL (ref 8–23)
CO2: 26 mmol/L (ref 22–32)
Calcium: 9 mg/dL (ref 8.9–10.3)
Chloride: 105 mmol/L (ref 98–111)
Creatinine, Ser: 1.29 mg/dL — ABNORMAL HIGH (ref 0.61–1.24)
GFR, Estimated: >60 mL/min (ref >60)
Glucose, Bld: 107 mg/dL — ABNORMAL HIGH (ref 70–99)
Potassium: 4.7 mmol/L (ref 3.5–5.1)
Sodium: 136 mmol/L (ref 135–145)

## 2020-12-25 NOTE — Progress Notes (Signed)
Referring Physician: Dr Gwenlyn Found  Primary Care: Former patient of Dr Zonia [facility] Rowan Blase [facility] Primary Cardiologist: Dr Gwenlyn Found  Neurology: Dr Beacher May  HF MD: Dr Aundra Dubin   HPI:  Justin Carroll is a 65 y.o. with a history of ADHD, depression, mild cognitive impairment vs adult ADHD, former heavy drinker (has not had alcohol in > 30 years), CAD s/p NSTEMI, and ischemic cardiomyopathy who was referred to CHF clinic from [facility] clinic.    Presented to [facility] 10/18/20 with chest pain. HS trop elevated. Had NSTEMI and underwent cath that showed severe one vessel disease mid left circumflex treated with DES.  Plan dual antiplatelet therapy x 12 months. [name] was added but had AKI so stopped prior to discharge. Prior to admit he was taking 20 mg Adderall everyday with an extra 20 mg 4-5 times a week. Adderall was stopped during hospitalization.  Echo showed EF 35-40%, normal RV.    Recently presented for post HF/CAD hospital follow up 12/06/20.  He seemed to be doing well symptomatically.  He was going to cardiac rehab.  Weight was down 4 lbs.  No significant exertional dyspnea and no chest pain.  His stamina was not as good as before the MI.  He had been diagnosed with sleep apnea and was waiting to get CPAP.   Today he returns to HF clinic for pharmacist medication titration. At last visit with MD, carvedilol was increased to 9.375 mg BID and spironolactone 12.5 mg daily was initiated. Additionally, he was referred to [facility] for [place] (scheduled for 01/09/21). Sertraline was increased to 25 mg daily. Overall he is feeling well today. Says he has been getting a lot out of cardiac rehab. Thinks his stamina has improved. Able to exercise for 30 minutes before getting SOB and needing to take a break. His goal is to exercise 5 days per week. No dizziness or lightheadedness. No CP. Weight at home has been decreasing due to improved diet and exercise. Weight 171.8 lbs in clinic today. No LEE, PND or orthopnea.  SBP at cardiac rehab consistently 100-115. Needs CPAP, has been calling their office to arrange.     HF Medications: Carvedilol 9.375 mg BID Entresto 24/26 mg BID Spironolactone 12.5 mg daily Farxiga 10 mg daily  Has the patient been experiencing any side effects to the medications prescribed?  no  Does the patient have any problems obtaining medications due to transportation or finances?   No insurance currently but signed up for Medicare during open enrollment. Approved for Entresto PAP through [employer]. Approved for Farxiga and Brilinta PAP through [employer].   Understanding of regimen: good Understanding of indications: good Potential of compliance: good Patient understands to avoid NSAIDs. Patient understands to avoid decongestants.    Pertinent Lab Values: 12/25/20: Serum creatinine 1.29, BUN 23, Potassium 4.7, Sodium 136  Vital Signs: Weight: 171.8 lbs (last clinic weight: 173.2 lbs) Blood pressure: 118/84 Heart rate: 67   Assessment/Plan: 1. 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  Echo in 10/2020 with EF 35-40%. LHC showed single vessel CAD involving mid left circumflex. Cardiomyopathy seems out of proportion to CAD.   - NYHA class II symptoms, not volume overloaded on exam. - Increase carvedilol to 12.5 mg BID. - Continue Entresto 24-26 mg BID.  - Continue Farxiga 10 mg daily.  - Continue spironolactone 12.5 mg daily - Repeat echo at followup in 2 months (02/21/21).  If EF remains low, consider cardiac MRI.   2. CAD: NSTEMI 10/2020, DES to pLCx.  No chest pain.  - Continue ASA 81 daily and ticagrelor 90 mg BID.  - Continue atorvastatin.  -Referred to [facility] Clinic for [place] 3. CKD: Stage 3.   4. ADHD: Off Adderall for now.  5. Depression:  - Continue sertraline 25 mg daily 6. OSA: Waiting for CPAP.   Follow up in 3 weeks with Pharmacy Clinic. Goal will be to increase Entresto.    Audry Riles, PharmD, BCPS, BCCP, CPP Heart  Failure Clinic Pharmacist [phone]

## 2020-12-25 NOTE — Telephone Encounter (Signed)
Patient states he has not gotten his CPAP machine yet. He is not sure what he needs to do next. He had his sleep study back in October. Please advise

## 2020-12-25 NOTE — Progress Notes (Signed)
Daily Session Note  Patient Details  Name: Justin Carroll MRN: [id number] Date of Birth: 03-09-1955 Referring Provider:   Flowsheet Row CARDIAC REHAB PHASE II ORIENTATION from 11/10/2020 in [place]  Referring Provider Dr. Gardiner Rhyme       Encounter Date: 12/25/2020  Check In:  Session Check In - 12/25/20 0815       Check-In   Physician(s) Dr. Harl Bowie    Location [facility]    Staff Present Hoy Register, MS, ACSM-CEP, Exercise Physiologist;Sindia Kowalczyk Zigmund Daniel, Exercise Physiologist    Virtual Visit No    Medication changes reported     No    Fall or balance concerns reported    No    Tobacco Cessation No Change    Warm-up and Cool-down Performed as group-led instruction    Resistance Training Performed Yes    VAD Patient? No    PAD/SET Patient? No      Pain Assessment   Currently in Pain? No/denies    Pain Score 0-No pain    Multiple Pain Sites No             Capillary Blood Glucose: No results found for this or any previous visit (from the past 24 hour(s)).    Social History   Tobacco Use  Smoking Status Former   Packs/day: 1.50   Years: 10.00   Pack years: 15.00   Types: Cigarettes   Start date: 1995   Quit date: 2015   Years since quitting: 7.8  Smokeless Tobacco Never    Goals Met:  Independence with exercise equipment Exercise tolerated well No report of concerns or symptoms today Strength training completed today  Goals Unmet:  Not Applicable  Comments: check out 0915   Dr. Kathie Dike is Medical Director for [facility] Pulmonary Rehab.

## 2020-12-27 ENCOUNTER — Encounter (HOSPITAL_COMMUNITY)
Admission: RE | Admit: 2020-12-27 | Discharge: 2020-12-27 | Disposition: A | Discharge status: Home / Self Care | Payer: Medicare Other | Source: Ambulatory Visit | Attending: Cardiovascular Disease | Admitting: Cardiovascular Disease

## 2020-12-27 DIAGNOSIS — I214 Non-ST elevation (NSTEMI) myocardial infarction: Secondary | ICD-10-CM

## 2020-12-27 DIAGNOSIS — Z955 Presence of coronary angioplasty implant and graft: Secondary | ICD-10-CM

## 2020-12-27 NOTE — Progress Notes (Signed)
Daily Session Note  Patient Details  Name: Justin Carroll MRN: [id number] Date of Birth: 1955/05/14 Referring Provider:   Flowsheet Row CARDIAC REHAB PHASE II ORIENTATION from 11/10/2020 in [place]  Referring Provider Dr. Gardiner Rhyme       Encounter Date: 12/27/2020  Check In:  Session Check In - 12/27/20 0815       Check-In   Physician(s) Dr. Harl Bowie    Location [facility]    Staff Present Hoy Register, MS, ACSM-CEP, Exercise Physiologist;Wissam Resor Zigmund Daniel, Exercise Physiologist    Virtual Visit No    Medication changes reported     No    Fall or balance concerns reported    No    Tobacco Cessation No Change    Warm-up and Cool-down Performed as group-led instruction    Resistance Training Performed Yes    VAD Patient? No    PAD/SET Patient? No      Pain Assessment   Currently in Pain? No/denies    Pain Score 0-No pain    Multiple Pain Sites No             Capillary Blood Glucose: No results found for this or any previous visit (from the past 24 hour(s)).    Social History   Tobacco Use  Smoking Status Former   Packs/day: 1.50   Years: 10.00   Pack years: 15.00   Types: Cigarettes   Start date: 1995   Quit date: 2015   Years since quitting: 7.8  Smokeless Tobacco Never    Goals Met:  Independence with exercise equipment Exercise tolerated well No report of concerns or symptoms today Strength training completed today  Goals Unmet:  Not Applicable  Comments: check out 0915   Dr. Kathie Dike is Medical Director for [facility] Pul[facility].

## 2020-12-29 ENCOUNTER — Encounter (HOSPITAL_COMMUNITY): Payer: Medicare Other

## 2021-01-01 ENCOUNTER — Encounter (HOSPITAL_COMMUNITY)
Admission: RE | Admit: 2021-01-01 | Discharge: 2021-01-01 | Disposition: A | Discharge status: Home / Self Care | Payer: Medicare Other | Source: Ambulatory Visit | Attending: Cardiovascular Disease | Admitting: Cardiovascular Disease

## 2021-01-01 DIAGNOSIS — Z955 Presence of coronary angioplasty implant and graft: Secondary | ICD-10-CM

## 2021-01-01 DIAGNOSIS — I214 Non-ST elevation (NSTEMI) myocardial infarction: Secondary | ICD-10-CM

## 2021-01-01 NOTE — Progress Notes (Signed)
Daily Session Note  Patient Details  Name: Justin Carroll MRN: [id number] Date of Birth: [birth year] Referring Provider:   Flowsheet Row CARDIAC REHAB PHASE II ORIENTATION from 11/10/2020 in [place]  Referring Provider Dr. Gardiner Rhyme       Encounter Date: 01/01/2021  Check In:  Session Check In - 01/01/21 0815       Check-In   Supervising physician immediately available to respond to emergencies [facility] MD immediately available    Physician(s) Dr. Donne Anon    Location [facility]    Staff Present Hoy Register, MS, ACSM-CEP, Exercise Physiologist;Keisi Eckford Zigmund Daniel, Exercise Physiologist    Virtual Visit No    Medication changes reported     No    Fall or balance concerns reported    No    Tobacco Cessation No Change    Warm-up and Cool-down Performed as group-led instruction    Resistance Training Performed Yes    VAD Patient? No    PAD/SET Patient? No      Pain Assessment   Currently in Pain? No/denies    Multiple Pain Sites No             Capillary Blood Glucose: No results found for this or any previous visit (from the past 24 hour(s)).    Social History   Tobacco Use  Smoking Status Former   Packs/day: 1.50   Years: 10.00   Pack years: 15.00   Types: Cigarettes   Start date: 1995   Quit date: 2015   Years since quitting: 7.9  Smokeless Tobacco Never    Goals Met:  Independence with exercise equipment Exercise tolerated well No report of concerns or symptoms today Strength training completed today  Goals Unmet:  Not Applicable  Comments: check out 0915   Dr. Kathie Dike is Medical Director for [facility] Pul[facility] Re[facility].

## 2021-01-02 ENCOUNTER — Ambulatory Visit (HOSPITAL_COMMUNITY)
Admission: RE | Admit: 2021-01-02 | Discharge: 2021-01-02 | Disposition: A | Discharge status: Home / Self Care | Payer: Medicare Other | Source: Ambulatory Visit | Attending: Cardiology | Admitting: Cardiology

## 2021-01-02 VITALS — BP 118/84 | HR 67 | Wt 171.8 lb

## 2021-01-02 DIAGNOSIS — N1832 Chronic kidney disease, stage 3b: Secondary | ICD-10-CM | POA: Insufficient documentation

## 2021-01-02 DIAGNOSIS — F32A Depression, unspecified: Secondary | ICD-10-CM | POA: Insufficient documentation

## 2021-01-02 DIAGNOSIS — Z7982 Long term (current) use of aspirin: Secondary | ICD-10-CM | POA: Diagnosis not present

## 2021-01-02 DIAGNOSIS — F909 Attention-deficit hyperactivity disorder, unspecified type: Secondary | ICD-10-CM | POA: Diagnosis not present

## 2021-01-02 DIAGNOSIS — I252 Old myocardial infarction: Secondary | ICD-10-CM | POA: Insufficient documentation

## 2021-01-02 DIAGNOSIS — G3184 Mild cognitive impairment, so stated: Secondary | ICD-10-CM | POA: Diagnosis not present

## 2021-01-02 DIAGNOSIS — I5022 Chronic systolic (congestive) heart failure: Secondary | ICD-10-CM | POA: Insufficient documentation

## 2021-01-02 DIAGNOSIS — Z7984 Long term (current) use of oral hypoglycemic drugs: Secondary | ICD-10-CM | POA: Insufficient documentation

## 2021-01-02 DIAGNOSIS — I255 Ischemic cardiomyopathy: Secondary | ICD-10-CM | POA: Diagnosis not present

## 2021-01-02 DIAGNOSIS — F1091 Alcohol use, unspecified, in remission: Secondary | ICD-10-CM | POA: Diagnosis not present

## 2021-01-02 DIAGNOSIS — I2510 Atherosclerotic heart disease of native coronary artery without angina pectoris: Secondary | ICD-10-CM | POA: Insufficient documentation

## 2021-01-02 DIAGNOSIS — G4733 Obstructive sleep apnea (adult) (pediatric): Secondary | ICD-10-CM | POA: Insufficient documentation

## 2021-01-02 DIAGNOSIS — Z79899 Other long term (current) drug therapy: Secondary | ICD-10-CM | POA: Diagnosis not present

## 2021-01-02 MED ORDER — SERTRALINE HCL 25 MG PO TABS: 25.0000 mg | ORAL_TABLET | Freq: Every day | ORAL | 11 refills | Status: DC

## 2021-01-02 MED ORDER — CARVEDILOL 12.5 MG PO TABS: 12.5000 mg | ORAL_TABLET | Freq: Two times a day (BID) | ORAL | 11 refills | Status: DC

## 2021-01-02 NOTE — Patient Instructions (Addendum)
It was a pleasure seeing you today!  MEDICATIONS: -We are changing your medications today -Increase carvedilol to 12.5 mg (1 tablet) twice daily. You may take 2 tablets of the 6.25 mg strength twice daily until you pick up the new strength.  -Call if you have questions about your medications.   NEXT APPOINTMENT: Return to clinic in 3 weeks with Pharmacy Clinic.  In general, to take care of your heart failure: -Limit your fluid intake to 2 Liters (half-gallon) per day.   -Limit your salt intake to ideally 2-3 grams (2000-3000 mg) per day. -Weigh yourself daily and record, and bring that "weight diary" to your next appointment.  (Weight gain of 2-3 pounds in 1 day typically means fluid weight.) -The medications for your heart are to help your heart and help you live longer.   -Please contact us before stopping any of your heart medications.  Call the clinic at [phone] with questions or to reschedule future appointments. -

## 2021-01-03 ENCOUNTER — Other Ambulatory Visit (HOSPITAL_COMMUNITY): Payer: Self-pay

## 2021-01-03 ENCOUNTER — Encounter (HOSPITAL_COMMUNITY)
Admission: RE | Admit: 2021-01-03 | Discharge: 2021-01-03 | Disposition: A | Discharge status: Home / Self Care | Payer: Medicare Other | Source: Ambulatory Visit | Attending: Cardiovascular Disease | Admitting: Cardiovascular Disease

## 2021-01-03 DIAGNOSIS — Z955 Presence of coronary angioplasty implant and graft: Secondary | ICD-10-CM

## 2021-01-03 DIAGNOSIS — I214 Non-ST elevation (NSTEMI) myocardial infarction: Secondary | ICD-10-CM

## 2021-01-03 MED ORDER — ENTRESTO 24-26 MG PO TABS: 1.0000 | ORAL_TABLET | Freq: Two times a day (BID) | ORAL | 3 refills | Status: DC

## 2021-01-03 NOTE — Progress Notes (Signed)
Daily Session Note  Patient Details  Name: Justin Carroll MRN: [id number] Date of Birth: 02/19/1955 Referring Provider:   Flowsheet Row CARDIAC REHAB PHASE II ORIENTATION from 11/10/2020 in [place]  Referring Provider Dr. Gardiner Rhyme       Encounter Date: 01/03/2021  Check In:  Session Check In - 01/03/21 0815       Check-In   Supervising physician immediately available to respond to emergencies [facility] MD immediately available    Physician(s) Dr. Domenic Polite    Location [facility]    Staff Present Hoy Register, MS, ACSM-CEP, Exercise Physiologist;Boubacar Lerette Zigmund Daniel, Exercise Physiologist    Virtual Visit No    Medication changes reported     No    Fall or balance concerns reported    No    Tobacco Cessation No Change    Warm-up and Cool-down Performed as group-led instruction    Resistance Training Performed Yes    VAD Patient? No    PAD/SET Patient? No      Pain Assessment   Currently in Pain? No/denies    Pain Score 0-No pain    Multiple Pain Sites No             Capillary Blood Glucose: No results found for this or any previous visit (from the past 24 hour(s)).    Social History   Tobacco Use  Smoking Status Former   Packs/day: 1.50   Years: 10.00   Pack years: 15.00   Types: Cigarettes   Start date: 1995   Quit date: 2015   Years since quitting: 7.9  Smokeless Tobacco Never    Goals Met:  Independence with exercise equipment Exercise tolerated well No report of concerns or symptoms today Strength training completed today  Goals Unmet:  Not Applicable  Comments: check out 0915   Dr. Kathie Dike is Medical Director for [facility] Pulmonary Rehab.

## 2021-01-03 NOTE — Telephone Encounter (Signed)
Patient stated at our last conversation "Patient does not think he can tolerate a cpap and wants to speak to dr Aundra Dubin at his 12/06/20 appt. before agreeing to wear the cpap. Called patient lmtcb.

## 2021-01-04 ENCOUNTER — Encounter (HOSPITAL_COMMUNITY): Payer: Self-pay | Admitting: Cardiology

## 2021-01-05 ENCOUNTER — Encounter (HOSPITAL_COMMUNITY)
Admission: RE | Admit: 2021-01-05 | Discharge: 2021-01-05 | Disposition: A | Discharge status: Home / Self Care | Payer: Medicare Other | Source: Ambulatory Visit | Attending: Cardiovascular Disease | Admitting: Cardiovascular Disease

## 2021-01-05 DIAGNOSIS — I214 Non-ST elevation (NSTEMI) myocardial infarction: Secondary | ICD-10-CM | POA: Diagnosis present

## 2021-01-05 DIAGNOSIS — Z955 Presence of coronary angioplasty implant and graft: Secondary | ICD-10-CM | POA: Diagnosis present

## 2021-01-05 NOTE — Telephone Encounter (Signed)
Patient called to say he is ready to start his cpap therapy. The order has been placed to [place] via community messsage.

## 2021-01-05 NOTE — Telephone Encounter (Signed)
Patient called to say he is ready to start his cpap therapy. The order has been placed to [place] via community messsage.

## 2021-01-05 NOTE — Progress Notes (Signed)
Daily Session Note  Patient Details  Name: Justin Carroll MRN: [id number] Date of Birth: Feb 22, 1955 Referring Provider:   Flowsheet Row CARDIAC REHAB PHASE II ORIENTATION from 11/10/2020 in [place]  Referring Provider Dr. Gardiner Rhyme       Encounter Date: 01/05/2021  Check In:  Session Check In - 01/05/21 0815       Check-In   Supervising physician immediately available to respond to emergencies [facility] MD immediately available    Physician(s) Dr. Domenic Polite    Location [facility]    Staff Present Hoy Register, MS, ACSM-CEP, Exercise Physiologist;Shmiel Morton Zigmund Daniel, Exercise Physiologist    Virtual Visit No    Medication changes reported     No    Fall or balance concerns reported    No    Tobacco Cessation No Change    Warm-up and Cool-down Performed as group-led instruction    Resistance Training Performed Yes    VAD Patient? No    PAD/SET Patient? No      Pain Assessment   Currently in Pain? No/denies    Pain Score 0-No pain    Multiple Pain Sites No             Capillary Blood Glucose: No results found for this or any previous visit (from the past 24 hour(s)).    Social History   Tobacco Use  Smoking Status Former   Packs/day: 1.50   Years: 10.00   Pack years: 15.00   Types: Cigarettes   Start date: 1995   Quit date: 2015   Years since quitting: 7.9  Smokeless Tobacco Never    Goals Met:  Independence with exercise equipment Exercise tolerated well No report of concerns or symptoms today Strength training completed today  Goals Unmet:  Not Applicable  Comments: check out 0915   Dr. Kathie Dike is Medical Director for [facility] Pulmonary Re[facility].

## 2021-01-05 NOTE — Addendum Note (Signed)
Addended by: Freada Bergeron on: 01/05/2021 02:04 PM   Modules accepted: Orders

## 2021-01-06 ENCOUNTER — Other Ambulatory Visit: Payer: Self-pay | Admitting: Cardiology

## 2021-01-08 ENCOUNTER — Encounter (HOSPITAL_COMMUNITY)
Admission: RE | Admit: 2021-01-08 | Discharge: 2021-01-08 | Disposition: A | Discharge status: Home / Self Care | Payer: Medicare Other | Source: Ambulatory Visit | Attending: Cardiovascular Disease | Admitting: Cardiovascular Disease

## 2021-01-08 ENCOUNTER — Other Ambulatory Visit: Payer: Self-pay

## 2021-01-08 VITALS — Wt 172.0 lb

## 2021-01-08 DIAGNOSIS — Z955 Presence of coronary angioplasty implant and graft: Secondary | ICD-10-CM | POA: Diagnosis not present

## 2021-01-08 DIAGNOSIS — I214 Non-ST elevation (NSTEMI) myocardial infarction: Secondary | ICD-10-CM

## 2021-01-08 NOTE — Progress Notes (Signed)
Daily Session Note  Patient Details  Name: Justin Carroll MRN: [id number] Date of Birth: February 06, 1955 Referring Provider:   Flowsheet Row CARDIAC REHAB PHASE II ORIENTATION from 11/10/2020 in [place]  Referring Provider Dr. Gardiner Rhyme       Encounter Date: 01/08/2021  Check In:  Session Check In - 01/08/21 0815       Check-In   Supervising physician immediately available to respond to emergencies [facility] MD immediately available    Physician(s) Dr. Harl Bowie    Location [facility]    Staff Present Geanie Cooley, RN;Heather Otho Ket, BS, Exercise Physiologist;Debra Wynetta Emery, RN, BSN    Virtual Visit No    Medication changes reported     No    Fall or balance concerns reported    No    Tobacco Cessation No Change    Warm-up and Cool-down Performed as group-led instruction    Resistance Training Performed Yes    VAD Patient? No    PAD/SET Patient? No      Pain Assessment   Currently in Pain? No/denies    Pain Score 0-No pain    Multiple Pain Sites No             Capillary Blood Glucose: No results found for this or any previous visit (from the past 24 hour(s)).    Social History   Tobacco Use  Smoking Status Former   Packs/day: 1.50   Years: 10.00   Pack years: 15.00   Types: Cigarettes   Start date: 1995   Quit date: 2015   Years since quitting: 7.9  Smokeless Tobacco Never    Goals Met:  Independence with exercise equipment Exercise tolerated well No report of concerns or symptoms today Strength training completed today  Goals Unmet:  Not Applicable  Comments: check out @ 9:15am   Dr. Kathie Dike is Medical Director for [facility] Pulmonary Re[facility].

## 2021-01-08 NOTE — Telephone Encounter (Signed)
This is Dr. Berry's pt 

## 2021-01-09 ENCOUNTER — Other Ambulatory Visit (HOSPITAL_COMMUNITY): Payer: Self-pay

## 2021-01-09 ENCOUNTER — Ambulatory Visit: Payer: Medicare Other | Admitting: Pharmacist Clinician (PhC)/ Clinical Pharmacy Specialist

## 2021-01-09 DIAGNOSIS — E785 Hyperlipidemia, unspecified: Secondary | ICD-10-CM

## 2021-01-09 NOTE — Progress Notes (Signed)
01/10/2021 JALEIL RENWICK [birth year] [id number]   HPI:  Justin Carroll is a 65 y.o. male patient of Dr Gwenlyn Found, who presents today for a lipid clinic evaluation.  See pertinent past medical history below.  He had an NSTEMI in September, at which time his LDL cholesterol was 106.  He was started on atorvastatin 80 mg, which he has been tolerating well.  Unfortunately this has not got him to LDL goal and is in the office today to discuss further cholesterol lowering options.    Past Medical History: ASCVD NSTEM - PCI w/DES to mid Cx  CHF 9/22 - HFrEF at 35-40%    Current Medications:  atorvastatin 80 mg   Cholesterol Goals: LDL < 55   Family history: father died from MI in late 3's as did both paternal uncles; mother had stroke in her 106's (had debilitating migraines prior); brother with series of strokes, died from vascular dementia; 2 sons, both in good health  Diet: mix of eating out and home cooked, working to choose healthy options, tries to eat vegetables regularly, watches sodium  Exercise:  cardiac rehab 3 days per week, tries to use treadmill 2-3 other days  Labs: 12/06/20:  TC 141, TG 66, HDL 47, LDL 81   Current Outpatient Medications  Medication Sig Dispense Refill   nitroGLYCERIN (NITROSTAT) 0.4 MG SL tablet DISSOLVE ONE TABLET UNDER THE TONGUE EVERY 5 MINUTES AS NEEDED FOR CHEST PAIN.  DO NOT EXCEED A TOTAL OF 3 DOSES IN 15 MINUTES 25 tablet 3   aspirin 81 MG EC tablet Take 1 tablet (81 mg total) by mouth daily. Swallow whole. 90 tablet 3   atorvastatin (LIPITOR) 80 MG tablet Take 1 tablet (80 mg total) by mouth daily. 90 tablet 3   Azelastine HCl 137 MCG/SPRAY SOLN USE 2 SPRAY(S) IN EACH NOSTRIL TWICE DAILY AS NEEDED FOR RHINITIS 30 mL 2   buPROPion (WELLBUTRIN XL) 300 MG 24 hr tablet Take 1 tablet by mouth once daily 90 tablet 0   carvedilol (COREG) 12.5 MG tablet Take 1 tablet (12.5 mg total) by mouth 2 (two) times daily with a meal. 60 tablet 11   Cholecalciferol (VITAMIN  D-3 PO) Take 2,000 Units by mouth daily.     dapagliflozin propanediol (FARXIGA) 10 MG TABS tablet Take 1 tablet (10 mg total) by mouth daily. 30 tablet 11   levocetirizine (XYZAL) 5 MG tablet Take 5 mg by mouth every evening.     LORazepam (ATIVAN) 0.5 MG tablet Take 1 tablet (0.5 mg total) by mouth daily as needed for anxiety. 7 tablet 0   mometasone (NASONEX) 50 MCG/ACT nasal spray Place 1 spray into the nose daily. 17 g 5   pantoprazole (PROTONIX) 40 MG tablet Take 1 tablet by mouth once daily 90 tablet 0   sacubitril-valsartan (ENTRESTO) 24-26 MG Take 1 tablet by mouth 2 (two) times daily. 180 tablet 3   sertraline (ZOLOFT) 25 MG tablet Take 1 tablet (25 mg total) by mouth at bedtime. 30 tablet 11   spironolactone (ALDACTONE) 25 MG tablet Take 0.5 tablets (12.5 mg total) by mouth daily. 15 tablet 3   tamsulosin (FLOMAX) 0.4 MG CAPS capsule TAKE 1 CAPSULE BY MOUTH ONCE DAILY AFTER SUPPER 90 capsule 0   testosterone cypionate (DEPOTESTOSTERONE CYPIONATE) 200 MG/ML injection INJECT 0.5 MLS INTO THE MUSCLE TWICE A WEEK. (Patient taking differently: Inject 100 mg into the muscle 2 (two) times a week.) 10 mL 2   ticagrelor (BRILINTA) 90 MG TABS tablet Take  1 tablet (90 mg total) by mouth 2 (two) times daily. 60 tablet 11   traZODone (DESYREL) 100 MG tablet TAKE 1 TABLET BY MOUTH AT BEDTIME 90 tablet 0   No current facility-administered medications for this visit.    No Known Allergies  Past Medical History:  Diagnosis Date   Depression    Sinus infection    yearly    There were no vitals taken for this visit.   Hyperlipemia Patient with ASCVD and LDL cholesterol not at goal despite compliance with high intensity statin use (atorvastatin 80 mg).  Reviewed options for lowering LDL cholesterol, including ezetimibe, PCSK-9 inhibitors, bempedoic acid and inclisiran.  Discussed mechanisms of action, dosing, side effects and potential decreases in LDL cholesterol.  Also reviewed cost  information and potential options for patient assistance.  Answered all patient questions.  Based on this information, patient would prefer to start a PCSK-9 inhibitor.  We will submit information to his insurance and once approved he will take Repatha 140 mg (or Praluent 150 mg) every 14 days.  Will repeat lipid labs after 4-6 doses.     Tommy Medal PharmD CPP [facility] [address] [place], [place] [address] [phone]

## 2021-01-09 NOTE — Patient Instructions (Signed)
Your Results:             Your most recent labs Goal  Total Cholesterol 141 < 200  Triglycerides 66 < 150  HDL (happy/good cholesterol) 47 > 40  LDL (lousy/bad cholesterol 81 < 55   Medication changes:  We will start the process to get Repatha covered by your insurance.  We will call you once it is approved.  You will give yourself an injection once every 14 day  Lab orders:  You will need to repeat cholesterol labs after 4-6 doses (about 2-3 months).  We will send you a lab order for that in early February.  Patient Assistance:  The [employer] foundation offers assistance to help pay for medication copays.  They will cover copays for all cholesterol lowering meds, including statins, fibrates, omega-3 oils, ezetimibe, Repatha, Praluent, Nexletol, Nexlizet.  The cards are usually good for $2,500 or 12 months, whichever comes first. Go to healthwellfoundation.org Click on "Apply Now" Answer questions as to whom is applying (patient or representative) Your disease fund will be "hypercholesterolemia - Medicare access" They will ask questions about finances and which medications you are taking for cholesterol When you submit, the approval is usually within minutes.  You will need to print the card information from the site You will need to show this information to your pharmacy, they will bill your Medicare Part D plan first -then bill [employer] --for the copay.   You can also call them at [phone], although the hold times can be quite long.   Thank you for choosing [facility]

## 2021-01-09 NOTE — Progress Notes (Signed)
Referring Physician: Dr Gwenlyn Found  Primary Care: Former patient of Dr Zonia Kief [facility] Rowan Blase PA Primary Cardiologist: Dr Gwenlyn Found  Neurology: Dr Beacher May  HF MD: Dr Aundra Dubin   HPI:  Justin Carroll is a 65 y.o. with a history of ADHD, depression, mild cognitive impairment vs adult ADHD, former heavy drinker (has not had alcohol in > 30 years), CAD s/p NSTEMI, and ischemic cardiomyopathy who was referred to CHF clinic from [facility] clinic.    Presented to [facility] 10/18/20 with chest pain. HS trop elevated. Had NSTEMI and underwent cath that showed severe one vessel disease mid left circumflex treated with DES.  Plan dual antiplatelet therapy x 12 months. [name] was added but had AKI so stopped prior to discharge. Prior to admit he was taking 20 mg Adderall everyday with an extra 20 mg 4-5 times a week. Adderall was stopped during hospitalization.  Echo showed EF 35-40%, normal RV.    Recently presented for post HF/CAD hospital follow up 12/06/20.  He seemed to be doing well symptomatically.  He was going to cardiac rehab.  Weight was down 4 lbs.  No significant exertional dyspnea and no chest pain.  His stamina was not as good as before the MI.  He had been diagnosed with sleep apnea and was waiting to get CPAP.   Today he returns to HF clinic for pharmacist medication titration. At recent visits to HF Clinic, carvedilol was increased to 12.5 mg BID and spironolactone 12.5 mg daily was initiated. Overall he is feeling well today. No dizziness, lightheadedness, chest pain or palpitations. Has 3 more appointments with cardiac rehab before finishing. Cardiac rehab has greatly improved his strength and conditioning. No SOB/DOE. Weight has been stable. No LEE, PND or orthopnea. BP 132/82 in clinic but did not take medications yet this morning.    HF Medications: Carvedilol 12.5 mg BID Entresto 24/26 mg BID Spironolactone 12.5 mg daily Farxiga 10 mg daily  Has the patient been experiencing any side effects to the  medications prescribed?  no  Does the patient have any problems obtaining medications due to transportation or finances?   Medicare part D. Approved for Entresto PAP through [employer]. Approved for Farxiga and Brilinta PAP through [employer].   Understanding of regimen: good Understanding of indications: good Potential of compliance: good Patient understands to avoid NSAIDs. Patient understands to avoid decongestants.    Pertinent Lab Values: 12/25/20: Serum creatinine 1.29, BUN 23, Potassium 4.7, Sodium 136  Vital Signs:  Weight: 171.2 lbs (last clinic weight: 171.8 lbs) Blood pressure: 132/82 - did not take morning medications yet Heart rate: 71   Assessment/Plan: 1. 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  Echo in 10/2020 with EF 35-40%. LHC showed single vessel CAD involving mid left circumflex. Cardiomyopathy seems out of proportion to CAD.   - NYHA class II symptoms, not volume overloaded on exam. - Continue carvedilol 12.5 mg BID. - Continue Entresto 24-26 mg BID.  - Continue Farxiga 10 mg daily.  - Increase spironolactone to 25 mg daily. Repeat BMET in 1 week.  - Repeat echo at followup next month (02/21/21).  If EF remains low, consider cardiac MRI.   2. CAD: NSTEMI 10/2020, DES to pLCx.  No chest pain.  - Continue ASA 81 daily and ticagrelor 90 mg BID.  - Continue atorvastatin.  - Continue Repatha 3. CKD: Stage 3.   4. ADHD: Off Adderall for now.  5. Depression:  - Continue sertraline 25 mg daily 6. OSA: Waiting for CPAP.  Follow up 1 month with Dr. Rush Farmer, PharmD, BCPS, [facility], [facility] Pharmacist [phone]

## 2021-01-10 ENCOUNTER — Encounter (HOSPITAL_COMMUNITY)
Admission: RE | Admit: 2021-01-10 | Discharge: 2021-01-10 | Disposition: A | Discharge status: Home / Self Care | Payer: Medicare Other | Source: Ambulatory Visit | Attending: Cardiovascular Disease | Admitting: Cardiovascular Disease

## 2021-01-10 ENCOUNTER — Encounter: Payer: Self-pay | Admitting: Pharmacist Clinician (PhC)/ Clinical Pharmacy Specialist

## 2021-01-10 DIAGNOSIS — Z955 Presence of coronary angioplasty implant and graft: Secondary | ICD-10-CM

## 2021-01-10 DIAGNOSIS — I214 Non-ST elevation (NSTEMI) myocardial infarction: Secondary | ICD-10-CM

## 2021-01-10 DIAGNOSIS — E785 Hyperlipidemia, unspecified: Secondary | ICD-10-CM | POA: Insufficient documentation

## 2021-01-10 NOTE — Assessment & Plan Note (Signed)
Patient with ASCVD and LDL cholesterol not at goal despite compliance with high intensity statin use (atorvastatin 80 mg).  Reviewed options for lowering LDL cholesterol, including ezetimibe, PCSK-9 inhibitors, bempedoic acid and inclisiran.  Discussed mechanisms of action, dosing, side effects and potential decreases in LDL cholesterol.  Also reviewed cost information and potential options for patient assistance.  Answered all patient questions.  Based on this information, patient would prefer to start a PCSK-9 inhibitor.  We will submit information to his insurance and once approved he will take Repatha 140 mg (or Praluent 150 mg) every 14 days.  Will repeat lipid labs after 4-6 doses.

## 2021-01-10 NOTE — Progress Notes (Signed)
Cardiac Individual Treatment Plan  Patient Details  Name: Justin Carroll MRN: [id number] Date of Birth: Jun 25, 1955 Referring Provider:   Flowsheet Row CARDIAC REHAB PHASE II ORIENTATION from 11/10/2020 in [facility]  Referring Provider Dr. Gardiner Rhyme       Initial Encounter Date:  Flowsheet Row CARDIAC REHAB PHASE II ORIENTATION from 11/10/2020 in [facility]  Date 11/10/20       Visit Diagnosis: NSTEMI (non-ST elevated myocardial infarction) [facility])  Status post coronary artery stent placement  Patient's Home Medications on Admission:  Current Outpatient Medications:    nitroGLYCERIN (NITROSTAT) 0.4 MG SL tablet, DISSOLVE ONE TABLET UNDER THE TONGUE EVERY 5 MINUTES AS NEEDED FOR CHEST PAIN.  DO NOT EXCEED A TOTAL OF 3 DOSES IN 15 MINUTES, Disp: 25 tablet, Rfl: 3   aspirin 81 MG EC tablet, Take 1 tablet (81 mg total) by mouth daily. Swallow whole., Disp: 90 tablet, Rfl: 3   atorvastatin (LIPITOR) 80 MG tablet, Take 1 tablet (80 mg total) by mouth daily., Disp: 90 tablet, Rfl: 3   Azelastine HCl 137 MCG/SPRAY SOLN, USE 2 SPRAY(S) IN EACH NOSTRIL TWICE DAILY AS NEEDED FOR RHINITIS, Disp: 30 mL, Rfl: 2   buPROPion (WELLBUTRIN XL) 300 MG 24 hr tablet, Take 1 tablet by mouth once daily, Disp: 90 tablet, Rfl: 0   carvedilol (COREG) 12.5 MG tablet, Take 1 tablet (12.5 mg total) by mouth 2 (two) times daily with a meal., Disp: 60 tablet, Rfl: 11   Cholecalciferol (VITAMIN D-3 PO), Take 2,000 Units by mouth daily., Disp: , Rfl:    dapagliflozin propanediol (FARXIGA) 10 MG TABS tablet, Take 1 tablet (10 mg total) by mouth daily., Disp: 30 tablet, Rfl: 11   levocetirizine (XYZAL) 5 MG tablet, Take 5 mg by mouth every evening., Disp: , Rfl:    LORazepam (ATIVAN) 0.5 MG tablet, Take 1 tablet (0.5 mg total) by mouth daily as needed for anxiety., Disp: 7 tablet, Rfl: 0   mometasone (NASONEX) 50 MCG/ACT nasal spray, Place 1 spray into the nose daily., Disp:  17 g, Rfl: 5   pantoprazole (PROTONIX) 40 MG tablet, Take 1 tablet by mouth once daily, Disp: 90 tablet, Rfl: 0   sacubitril-valsartan (ENTRESTO) 24-26 MG, Take 1 tablet by mouth 2 (two) times daily., Disp: 180 tablet, Rfl: 3   sertraline (ZOLOFT) 25 MG tablet, Take 1 tablet (25 mg total) by mouth at bedtime., Disp: 30 tablet, Rfl: 11   spironolactone (ALDACTONE) 25 MG tablet, Take 0.5 tablets (12.5 mg total) by mouth daily., Disp: 15 tablet, Rfl: 3   tamsulosin (FLOMAX) 0.4 MG CAPS capsule, TAKE 1 CAPSULE BY MOUTH ONCE DAILY AFTER SUPPER, Disp: 90 capsule, Rfl: 0   testosterone cypionate (DEPOTESTOSTERONE CYPIONATE) 200 MG/ML injection, INJECT 0.5 MLS INTO THE MUSCLE TWICE A WEEK. (Patient taking differently: Inject 100 mg into the muscle 2 (two) times a week.), Disp: 10 mL, Rfl: 2   ticagrelor (BRILINTA) 90 MG TABS tablet, Take 1 tablet (90 mg total) by mouth 2 (two) times daily., Disp: 60 tablet, Rfl: 11   traZODone (DESYREL) 100 MG tablet, TAKE 1 TABLET BY MOUTH AT BEDTIME, Disp: 90 tablet, Rfl: 0  Past Medical History: Past Medical History:  Diagnosis Date   Depression    Sinus infection    yearly    Tobacco Use: Social History   Tobacco Use  Smoking Status Former   Packs/day: 1.50   Years: 10.00   Pack years: 15.00   Types: Cigarettes  Start date: 59   Quit date: 2015   Years since quitting: 7.9  Smokeless Tobacco Never    Labs: Recent Review Scientist, physiological     Labs for ITP Cardiac and Pulmonary Rehab Latest Ref Rng & Units 10/18/2020 12/06/2020   Cholestrol 0 - 200 mg/dL 191 141   LDLCALC 0 - 99 mg/dL 106(H) 81   HDL >40 mg/dL 61 47   Trlycerides <150 mg/dL 120 66   Hemoglobin A1c 4.8 - 5.6 % 6.3(H) -       Capillary Blood Glucose: Lab Results  Component Value Date   GLUCAP 127 (H) 10/20/2020   GLUCAP 90 10/20/2020   GLUCAP 104 (H) 10/19/2020   GLUCAP 84 10/19/2020   GLUCAP 124 (H) 10/19/2020     Exercise Target Goals: Exercise Program  Goal: Individual exercise prescription set using results from initial 6 min walk test and THRR while considering  patient's activity barriers and safety.   Exercise Prescription Goal: Starting with aerobic activity 30 plus minutes a day, 3 days per week for initial exercise prescription. Provide home exercise prescription and guidelines that participant acknowledges understanding prior to discharge.  Activity Barriers & Risk Stratification:  Activity Barriers & Cardiac Risk Stratification - 11/10/20 1307       Activity Barriers & Cardiac Risk Stratification   Cardiac Risk Stratification High             6 Minute Walk:  6 Minute Walk     Row Name 11/10/20 1402         6 Minute Walk   Phase Initial     Distance 1500 feet     Walk Time 6 minutes     # of Rest Breaks 0     MPH 2.8     METS 3.17     RPE 12     VO2 Peak 11.12     Symptoms No     Resting HR 76 bpm     Resting BP 104/56     Resting Oxygen Saturation  96 %     Exercise Oxygen Saturation  during 6 min walk 95 %     Max Ex. HR 82 bpm     Max Ex. BP 120/60     2 Minute Post BP 100/58              Oxygen Initial Assessment:   Oxygen Re-Evaluation:   Oxygen Discharge (Final Oxygen Re-Evaluation):   Initial Exercise Prescription:  Initial Exercise Prescription - 11/10/20 1400       Date of Initial Exercise RX and Referring Provider   Date 11/10/20    Referring Provider Dr. Gardiner Rhyme    Expected Discharge Date 02/02/21      Treadmill   MPH 2.3    Grade 0    Minutes 22    METs 0      Recumbant Elliptical   Level 1    RPM 60    Minutes 17    METs 0      Prescription Details   Frequency (times per week) 3    Duration Progress to 30 minutes of continuous aerobic without signs/symptoms of physical distress      Intensity   THRR 40-80% of Max Heartrate 61-122    Ratings of Perceived Exertion 11-15    Perceived Dyspnea 0-4      Progression   Progression Continue to progress workloads  to maintain intensity without signs/symptoms of physical distress.  Resistance Training   Training Prescription Yes    Weight 4    Reps 10-15             Perform Capillary Blood Glucose checks as needed.  Exercise Prescription Changes:   Exercise Prescription Changes     Row Name 11/13/20 1200 11/27/20 1200 12/06/20 0800 12/11/20 1200 12/25/20 1000     Response to Exercise   Blood Pressure (Admit) 114/68 122/70 -- 110/60 120/60   Blood Pressure (Exercise) 144/74 112/52 -- 130/60 130/60   Blood Pressure (Exit) 116/70 108/68 -- 108/60 115/60   Heart Rate (Admit) 68 bpm 74 bpm -- 101 bpm 70 bpm   Heart Rate (Exercise) 87 bpm 92 bpm -- 97 bpm 83 bpm   Heart Rate (Exit) 77 bpm 83 bpm -- 88 bpm 74 bpm   Rating of Perceived Exertion (Exercise) 12 12 -- 11 12   Duration Continue with 30 min of aerobic exercise without signs/symptoms of physical distress. Continue with 30 min of aerobic exercise without signs/symptoms of physical distress. -- Continue with 30 min of aerobic exercise without signs/symptoms of physical distress. Continue with 30 min of aerobic exercise without signs/symptoms of physical distress.   Intensity THRR unchanged THRR unchanged -- THRR unchanged THRR unchanged     Progression   Progression Continue to progress workloads to maintain intensity without signs/symptoms of physical distress. Continue to progress workloads to maintain intensity without signs/symptoms of physical distress. -- Continue to progress workloads to maintain intensity without signs/symptoms of physical distress. Continue to progress workloads to maintain intensity without signs/symptoms of physical distress.     Resistance Training   Training Prescription Yes Yes -- Yes Yes   Weight 4 5 lbs -- 5 5   Reps 10-15 10-15 -- 10-15 10-15   Time 10 Minutes 10 Minutes -- 10 Minutes 10 Minutes     Treadmill   MPH -- 2.3 -- 2.3 2.5   Grade -- 0 -- 0 0   Minutes -- 17 -- 17 17   METs -- 2.76 --  2.76 2.91     NuStep   Level 1 2 -- 2 2   SPM 93 106 -- 109 114   Minutes 22 22 -- 22 22   METs 2.2 2.7 -- 2.85 3.23     Recumbant Elliptical   Level 1 -- -- -- --   RPM 56 -- -- -- --   Minutes 17 -- -- -- --   METs 4.1 -- -- -- --     Home Exercise Plan   Plans to continue exercise at -- -- Home (comment) -- --   Frequency -- -- Add 2 additional days to program exercise sessions. -- --   Initial Home Exercises Provided -- -- 12/06/20 -- --    [place] Name 01/08/21 1300             Response to Exercise   Blood Pressure (Admit) 98/50       Blood Pressure (Exercise) 115/65       Blood Pressure (Exit) 100/65       Heart Rate (Admit) 69 bpm       Heart Rate (Exercise) 89 bpm       Heart Rate (Exit) 79 bpm       Rating of Perceived Exertion (Exercise) 12       Duration Continue with 30 min of aerobic exercise without signs/symptoms of physical distress.       Intensity THRR unchanged  Progression   Progression Continue to progress workloads to maintain intensity without signs/symptoms of physical distress.         Resistance Training   Training Prescription Yes       Weight 5       Reps 10-15       Time 10 Minutes         Treadmill   MPH 2.9       Grade 0       Minutes 17       METs 3.22         NuStep   Level 2       SPM 113       Minutes 22       METs 3.22                Exercise Comments:   Exercise Comments     Row Name 12/06/20 0854           Exercise Comments home exercise reviewed                Exercise Goals and Review:   Exercise Goals     Row Name 11/10/20 1407 11/13/20 1242 12/11/20 1249 01/08/21 1320       Exercise Goals   Increase Physical Activity Yes Yes Yes Yes    Intervention Provide advice, education, support and counseling about physical activity/exercise needs.;Develop an individualized exercise prescription for aerobic and resistive training based on initial evaluation findings, risk stratification,  comorbidities and participant's personal goals. Provide advice, education, support and counseling about physical activity/exercise needs.;Develop an individualized exercise prescription for aerobic and resistive training based on initial evaluation findings, risk stratification, comorbidities and participant's personal goals. Provide advice, education, support and counseling about physical activity/exercise needs.;Develop an individualized exercise prescription for aerobic and resistive training based on initial evaluation findings, risk stratification, comorbidities and participant's personal goals. Provide advice, education, support and counseling about physical activity/exercise needs.;Develop an individualized exercise prescription for aerobic and resistive training based on initial evaluation findings, risk stratification, comorbidities and participant's personal goals.    Expected Outcomes Short Term: Attend rehab on a regular basis to increase amount of physical activity.;Long Term: Add in home exercise to make exercise part of routine and to increase amount of physical activity.;Long Term: Exercising regularly at least 3-5 days a week. Short Term: Attend rehab on a regular basis to increase amount of physical activity.;Long Term: Add in home exercise to make exercise part of routine and to increase amount of physical activity.;Long Term: Exercising regularly at least 3-5 days a week. Short Term: Attend rehab on a regular basis to increase amount of physical activity.;Long Term: Add in home exercise to make exercise part of routine and to increase amount of physical activity.;Long Term: Exercising regularly at least 3-5 days a week. Short Term: Attend rehab on a regular basis to increase amount of physical activity.;Long Term: Add in home exercise to make exercise part of routine and to increase amount of physical activity.;Long Term: Exercising regularly at least 3-5 days a week.    Increase Strength and  Stamina Yes Yes Yes Yes    Intervention Provide advice, education, support and counseling about physical activity/exercise needs.;Develop an individualized exercise prescription for aerobic and resistive training based on initial evaluation findings, risk stratification, comorbidities and participant's personal goals. Provide advice, education, support and counseling about physical activity/exercise needs.;Develop an individualized exercise prescription for aerobic and resistive training based on initial evaluation findings, risk stratification, comorbidities  and participant's personal goals. Provide advice, education, support and counseling about physical activity/exercise needs.;Develop an individualized exercise prescription for aerobic and resistive training based on initial evaluation findings, risk stratification, comorbidities and participant's personal goals. Provide advice, education, support and counseling about physical activity/exercise needs.;Develop an individualized exercise prescription for aerobic and resistive training based on initial evaluation findings, risk stratification, comorbidities and participant's personal goals.    Expected Outcomes Short Term: Increase workloads from initial exercise prescription for resistance, speed, and METs.;Short Term: Perform resistance training exercises routinely during rehab and add in resistance training at home;Long Term: Improve cardiorespiratory fitness, muscular endurance and strength as measured by increased METs and functional capacity (6MWT) Short Term: Increase workloads from initial exercise prescription for resistance, speed, and METs.;Short Term: Perform resistance training exercises routinely during rehab and add in resistance training at home;Long Term: Improve cardiorespiratory fitness, muscular endurance and strength as measured by increased METs and functional capacity (6MWT) Short Term: Increase workloads from initial exercise prescription  for resistance, speed, and METs.;Short Term: Perform resistance training exercises routinely during rehab and add in resistance training at home;Long Term: Improve cardiorespiratory fitness, muscular endurance and strength as measured by increased METs and functional capacity (6MWT) Short Term: Increase workloads from initial exercise prescription for resistance, speed, and METs.;Short Term: Perform resistance training exercises routinely during rehab and add in resistance training at home;Long Term: Improve cardiorespiratory fitness, muscular endurance and strength as measured by increased METs and functional capacity (6MWT)    Able to understand and use rate of perceived exertion (RPE) scale Yes Yes Yes Yes    Intervention Provide education and explanation on how to use RPE scale Provide education and explanation on how to use RPE scale Provide education and explanation on how to use RPE scale Provide education and explanation on how to use RPE scale    Expected Outcomes Short Term: Able to use RPE daily in rehab to express subjective intensity level;Long Term:  Able to use RPE to guide intensity level when exercising independently Short Term: Able to use RPE daily in rehab to express subjective intensity level;Long Term:  Able to use RPE to guide intensity level when exercising independently Short Term: Able to use RPE daily in rehab to express subjective intensity level;Long Term:  Able to use RPE to guide intensity level when exercising independently Short Term: Able to use RPE daily in rehab to express subjective intensity level;Long Term:  Able to use RPE to guide intensity level when exercising independently    Knowledge and understanding of Target Heart Rate Range (THRR) Yes Yes Yes Yes    Intervention Provide education and explanation of THRR including how the numbers were predicted and where they are located for reference Provide education and explanation of THRR including how the numbers were  predicted and where they are located for reference Provide education and explanation of THRR including how the numbers were predicted and where they are located for reference Provide education and explanation of THRR including how the numbers were predicted and where they are located for reference    Expected Outcomes Short Term: Able to state/look up THRR;Short Term: Able to use daily as guideline for intensity in rehab;Long Term: Able to use THRR to govern intensity when exercising independently Short Term: Able to state/look up THRR;Short Term: Able to use daily as guideline for intensity in rehab;Long Term: Able to use THRR to govern intensity when exercising independently Short Term: Able to state/look up THRR;Short Term: Able to use daily as  guideline for intensity in rehab;Long Term: Able to use THRR to govern intensity when exercising independently Short Term: Able to state/look up THRR;Short Term: Able to use daily as guideline for intensity in rehab;Long Term: Able to use THRR to govern intensity when exercising independently    Able to check pulse independently Yes Yes Yes Yes    Intervention Review the importance of being able to check your own pulse for safety during independent exercise;Provide education and demonstration on how to check pulse in carotid and radial arteries. Review the importance of being able to check your own pulse for safety during independent exercise;Provide education and demonstration on how to check pulse in carotid and radial arteries. Review the importance of being able to check your own pulse for safety during independent exercise;Provide education and demonstration on how to check pulse in carotid and radial arteries. Review the importance of being able to check your own pulse for safety during independent exercise;Provide education and demonstration on how to check pulse in carotid and radial arteries.    Expected Outcomes Short Term: Able to explain why pulse checking  is important during independent exercise;Long Term: Able to check pulse independently and accurately Short Term: Able to explain why pulse checking is important during independent exercise;Long Term: Able to check pulse independently and accurately Short Term: Able to explain why pulse checking is important during independent exercise;Long Term: Able to check pulse independently and accurately Short Term: Able to explain why pulse checking is important during independent exercise;Long Term: Able to check pulse independently and accurately    Understanding of Exercise Prescription Yes Yes Yes Yes    Intervention Provide education, explanation, and written materials on patient's individual exercise prescription Provide education, explanation, and written materials on patient's individual exercise prescription Provide education, explanation, and written materials on patient's individual exercise prescription Provide education, explanation, and written materials on patient's individual exercise prescription    Expected Outcomes Short Term: Able to explain program exercise prescription;Long Term: Able to explain home exercise prescription to exercise independently Short Term: Able to explain program exercise prescription;Long Term: Able to explain home exercise prescription to exercise independently Short Term: Able to explain program exercise prescription;Long Term: Able to explain home exercise prescription to exercise independently Short Term: Able to explain program exercise prescription;Long Term: Able to explain home exercise prescription to exercise independently             Exercise Goals Re-Evaluation :  Exercise Goals Re-Evaluation     [place] Name 11/13/20 1242 12/11/20 1250 01/08/21 1321 01/08/21 1324       Exercise Goal Re-Evaluation   Exercise Goals Review Increase Physical Activity;Increase Strength and Stamina;Able to understand and use rate of perceived exertion (RPE) scale;Knowledge and  understanding of Target Heart Rate Range (THRR);Able to check pulse independently;Understanding of Exercise Prescription Increase Physical Activity;Increase Strength and Stamina;Able to understand and use rate of perceived exertion (RPE) scale;Knowledge and understanding of Target Heart Rate Range (THRR);Able to check pulse independently;Understanding of Exercise Prescription Increase Physical Activity;Increase Strength and Stamina;Able to understand and use rate of perceived exertion (RPE) scale;Knowledge and understanding of Target Heart Rate Range (THRR);Able to check pulse independently;Understanding of Exercise Prescription --    Comments Pt attended 2 sessions of cardiac rehab. He is currently working at 4.1 METs of the Ellp. Will continue to montior and progress as able. Pt has completed 14 sessions of cardiac rehab. He is progressing himself in sessions and at home exercise. He is currently working at 2.85 METs  in the stepper. Will continue to montior and progress as able. Pt has completed 25 sessions of cardiac rehab. He continues to progress and increase his workload during class. He is currently working at 3.22 METs on the stepper and TM. Will continue to monitor and progress as able. --    Expected Outcomes Through exercise at rehab and at home, the patient will meet their stated goals. Through exercise at rehab and at home, the patient will meet their stated goals. Through exercise at rehab and at home, the patient will meet their stated goals. Through exercise at rehab and at home, the patient will meet their stated goals.              Discharge Exercise Prescription (Final Exercise Prescription Changes):  Exercise Prescription Changes - 01/08/21 1300       Response to Exercise   Blood Pressure (Admit) 98/50    Blood Pressure (Exercise) 115/65    Blood Pressure (Exit) 100/65    Heart Rate (Admit) 69 bpm    Heart Rate (Exercise) 89 bpm    Heart Rate (Exit) 79 bpm    Rating of  Perceived Exertion (Exercise) 12    Duration Continue with 30 min of aerobic exercise without signs/symptoms of physical distress.    Intensity THRR unchanged      Progression   Progression Continue to progress workloads to maintain intensity without signs/symptoms of physical distress.      Resistance Training   Training Prescription Yes    Weight 5    Reps 10-15    Time 10 Minutes      Treadmill   MPH 2.9    Grade 0    Minutes 17    METs 3.22      NuStep   Level 2    SPM 113    Minutes 22    METs 3.22             Nutrition:  Target Goals: Understanding of nutrition guidelines, daily intake of sodium 1500mg , cholesterol 200mg , calories 30% from fat and 7% or less from saturated fats, daily to have 5 or more servings of fruits and vegetables.  Biometrics:  Pre Biometrics - 11/10/20 1408       Pre Biometrics   Height 5\' 7"  (1.702 m)    Weight 79.3 kg    Waist Circumference 41 inches    Hip Circumference 39 inches    Waist to Hip Ratio 1.05 %    BMI (Calculated) 27.37    Triceps Skinfold 8 mm    % Body Fat 25.5 %    Grip Strength 28.5 kg    Flexibility 4.5 in    Single Leg Stand 19 seconds              Nutrition Therapy Plan and Nutrition Goals:  Nutrition Therapy & Goals - 11/15/20 0715       Personal Nutrition Goals   Comments Patient scored 20 on his diet assessment. We offer 2 educational sessions on heart healthy nutrition with handouts and assistance with RD referral if patient is interested.      Intervention Plan   Intervention Nutrition handout(s) given to patient.    Expected Outcomes Short Term Goal: Understand basic principles of dietary content, such as calories, fat, sodium, cholesterol and nutrients.             Nutrition Assessments:  Nutrition Assessments - 11/10/20 1245       MEDFICTS Scores   Pre Score  20            MEDIFICTS Score Key: ?70 Need to make dietary changes  40-70 Heart Healthy Diet ? 40  Therapeutic Level Cholesterol Diet   Picture Your Plate Scores: <13 Unhealthy dietary pattern with much room for improvement. 41-50 Dietary pattern unlikely to meet recommendations for good health and room for improvement. 51-60 More healthful dietary pattern, with some room for improvement.  >60 Healthy dietary pattern, although there may be some specific behaviors that could be improved.    Nutrition Goals Re-Evaluation:   Nutrition Goals Discharge (Final Nutrition Goals Re-Evaluation):   Psychosocial: Target Goals: Acknowledge presence or absence of significant depression and/or stress, maximize coping skills, provide positive support system. Participant is able to verbalize types and ability to use techniques and skills needed for reducing stress and depression.  Initial Review & Psychosocial Screening:  Initial Psych Review & Screening - 11/10/20 1309       Initial Review   Current issues with Current Sleep Concerns;Current Depression;Current Anxiety/Panic   Takes zoloft for depression and ativan for anxiety. His sleep is altered but he was recently diagnosed with OSA and is waiting on his CPAP to arrive.     Family Dynamics   Good Support System? Yes    Comments His support system is his wife.      Barriers   Psychosocial barriers to participate in program The patient should benefit from training in stress management and relaxation.      Screening Interventions   Interventions Encouraged to exercise;To provide support and resources with identified psychosocial needs;Provide feedback about the scores to participant    Expected Outcomes Long Term Goal: Stressors or current issues are controlled or eliminated.;Short Term goal: Identification and review with participant of any Quality of Life or Depression concerns found by scoring the questionnaire.;Long Term goal: The participant improves quality of Life and PHQ9 Scores as seen by post scores and/or verbalization of changes              Quality of Life Scores:  Quality of Life - 11/10/20 1409       Quality of Life   Select Quality of Life      Quality of Life Scores   Health/Function Pre 15.6 %    Socioeconomic Pre 20.5 %    Psych/Spiritual Pre 21.43 %    Family Pre 22.8 %    GLOBAL Pre 18.87 %            Scores of 19 and below usually indicate a poorer quality of life in these areas.  A difference of  2-3 points is a clinically meaningful difference.  A difference of 2-3 points in the total score of the Quality of Life Index has been associated with significant improvement in overall quality of life, self-image, physical symptoms, and general health in studies assessing change in quality of life.  PHQ-9: Recent Review Flowsheet Data     Depression screen [facility] 2/9 11/10/2020 05/17/2020   Decreased Interest 1 0   Down, Depressed, Hopeless 0 0   PHQ - 2 Score 1 0   Altered sleeping 1 0   Tired, decreased energy 2 0   Change in appetite 1 0   Feeling bad or failure about yourself  0 0   Trouble concentrating 1 0   Moving slowly or fidgety/restless 0 0   Suicidal thoughts 0 0   PHQ-9 Score 6 0   Difficult doing work/chores Very difficult Not difficult at  all      Interpretation of Total Score  Total Score Depression Severity:  1-4 = Minimal depression, 5-9 = Mild depression, 10-14 = Moderate depression, 15-19 = Moderately severe depression, 20-27 = Severe depression   Psychosocial Evaluation and Intervention:  Psychosocial Evaluation - 11/10/20 1330       Psychosocial Evaluation & Interventions   Interventions Stress management education;Relaxation education;Encouraged to exercise with the program and follow exercise prescription    Comments Pt has no barriers to completing cardiac rehab. He does have anxiety and depression. His anxiety is treated with ativan and his depression is treated with zoloft. He reports being unhappy lately due to his recent NSTEMI. He has been a Chief Executive Officer for 40  years and is used to being busy with his work. This is all very different to him since he is out of work for now. He also reports problems with his sleep, but he was recently diagnosed with OSA. He has ordered a CPAP and is waiting for it to come in. He reports that he has a good support system from his wife. He reports that he has made several lifestyle changes since his NSTEMI such as cleaning up his diet. His goals while in the program are to gain his strength and stamina back. He is eager to start rehab and to use his time off of work to improve his health.    Expected Outcomes The patient will continue to take his medications for depression and anxiety and will have no further identifiable psychosocial issues.    Continue Psychosocial Services  No Follow up required             Psychosocial Re-Evaluation:  Psychosocial Re-Evaluation     [place] Name 11/15/20 0718 12/07/20 0747 01/01/21 1509         Psychosocial Re-Evaluation   Current issues with Current Depression;Current Anxiety/Panic;Current Sleep Concerns Current Depression;Current Anxiety/Panic;Current Sleep Concerns Current Depression;Current Anxiety/Panic;Current Sleep Concerns     Comments Patient is new to the program completing 3 sessions. He continues to have no psychosocial barriers identified. His depression/anxiety and sleep all all managed with Zoloft, Lorazepam, Wellbutrin and Trazodone. We will continue to monitor. Patient has completed 12 sessions and continues to have no psychosocial barriers identified. He seems to enjoy coming to the sessions and demonstrates an interest in improving his health. He saw his cardiologist 12/06/20 at [facility]. Patient recently started on Sertraline 12.5 mg; MD titrated up to 25 mg daily. He is also on Lorazepam and Wellbutrin for managment of depression and anxiety and Trazodone for sleep management. We will continue to monitor his progress. Patient has completed 22 sessions and continues to  have no psychosocial barriers identified. He continues to enjoy coming to the sessions and demonstrates an interest in improving his health. Patient continues on titrated does of sertraline of 25 mg and Lorazepam and Wellbutrin for managment of depression and anxiety and Trazodone for sleep management. We will continue to monitor his progress.     Expected Outcomes Patient will continue to have no psychosocial barriers identified. Patient will continue to have no psychosocial barriers identified. Patient will continue to have no psychosocial barriers identified.     Interventions Stress management education;Encouraged to attend Cardiac Rehabilitation for the exercise;Relaxation education Stress management education;Encouraged to attend Cardiac Rehabilitation for the exercise;Relaxation education Stress management education;Encouraged to attend Cardiac Rehabilitation for the exercise;Relaxation education     Continue Psychosocial Services  No Follow up required No Follow up required No Follow  up required              Psychosocial Discharge (Final Psychosocial Re-Evaluation):  Psychosocial Re-Evaluation - 01/01/21 1509       Psychosocial Re-Evaluation   Current issues with Current Depression;Current Anxiety/Panic;Current Sleep Concerns    Comments Patient has completed 22 sessions and continues to have no psychosocial barriers identified. He continues to enjoy coming to the sessions and demonstrates an interest in improving his health. Patient continues on titrated does of sertraline of 25 mg and Lorazepam and Wellbutrin for managment of depression and anxiety and Trazodone for sleep management. We will continue to monitor his progress.    Expected Outcomes Patient will continue to have no psychosocial barriers identified.    Interventions Stress management education;Encouraged to attend Cardiac Rehabilitation for the exercise;Relaxation education    Continue Psychosocial Services  No Follow up  required             Vocational Rehabilitation: Provide vocational rehab assistance to qualifying candidates.   Vocational Rehab Evaluation & Intervention:  Vocational Rehab - 11/10/20 1253       Initial Vocational Rehab Evaluation & Intervention   Assessment shows need for Vocational Rehabilitation No             Education: Education Goals: Education classes will be provided on a weekly basis, covering required topics. Participant will state understanding/return demonstration of topics presented.  Learning Barriers/Preferences:  Learning Barriers/Preferences - 11/10/20 1246       Learning Barriers/Preferences   Learning Barriers None    Learning Preferences Audio;Computer/Internet;Group Instruction;Individual Instruction;Skilled Demonstration;Pictoral;Verbal Instruction;Video;Written Material             Education Topics: Hypertension, Hypertension Reduction -Define heart disease and high blood pressure. Discus how high blood pressure affects the body and ways to reduce high blood pressure.   Exercise and Your Heart -Discuss why it is important to exercise, the FITT principles of exercise, normal and abnormal responses to exercise, and how to exercise safely. Flowsheet Row CARDIAC REHAB PHASE II EXERCISE from 01/03/2021 in [place]  Date 11/15/20  Educator DF  Instruction Review Code 2- Demonstrated Understanding       Angina -Discuss definition of angina, causes of angina, treatment of angina, and how to decrease risk of having angina. Flowsheet Row CARDIAC REHAB PHASE II EXERCISE from 01/03/2021 in [place]  Date 11/22/20  Educator DF  Instruction Review Code 2- Demonstrated Understanding       Cardiac Medications -Review what the following cardiac medications are used for, how they affect the body, and side effects that may occur when taking the medications.  Medications include Aspirin, Beta  blockers, calcium channel blockers, ACE Inhibitors, angiotensin receptor blockers, diuretics, digoxin, and antihyperlipidemics. Flowsheet Row CARDIAC REHAB PHASE II EXERCISE from 01/03/2021 in [place]  Date 11/29/20  Educator pb  Instruction Review Code 1- Verbalizes Understanding       Congestive Heart Failure -Discuss the definition of CHF, how to live with CHF, the signs and symptoms of CHF, and how keep track of weight and sodium intake. Flowsheet Row CARDIAC REHAB PHASE II EXERCISE from 01/03/2021 in [place]  Date 12/06/20  Educator DF  Instruction Review Code 2- Demonstrated Understanding       Heart Disease and Intimacy -Discus the effect sexual activity has on the heart, how changes occur during intimacy as we age, and safety during sexual activity. Flowsheet Row CARDIAC REHAB PHASE II EXERCISE from 01/03/2021  in [place]  Date 12/13/20  Educator DF  Instruction Review Code 2- Demonstrated Understanding       Smoking Cessation / COPD -Discuss different methods to quit smoking, the health benefits of quitting smoking, and the definition of COPD. Flowsheet Row CARDIAC REHAB PHASE II EXERCISE from 01/03/2021 in [place]  Date 12/20/20  Educator DF  Instruction Review Code 2- Demonstrated Understanding       Nutrition I: Fats -Discuss the types of cholesterol, what cholesterol does to the heart, and how cholesterol levels can be controlled. Flowsheet Row CARDIAC REHAB PHASE II EXERCISE from 01/03/2021 in [place]  Date 12/27/20  Educator [place]  Instruction Review Code 2- Demonstrated Understanding       Nutrition II: Labels -Discuss the different components of food labels and how to read food label [place] from 01/03/2021 in [place]  Date 01/03/21  Educator [place]  Instruction  Review Code 2- Demonstrated Understanding       Heart Parts/Heart Disease and PAD -Discuss the anatomy of the heart, the pathway of blood circulation through the heart, and these are affected by heart disease.   Stress I: Signs and Symptoms -Discuss the causes of stress, how stress may lead to anxiety and depression, and ways to limit stress.   Stress II: Relaxation -Discuss different types of relaxation techniques to limit stress.   Warning Signs of Stroke / TIA -Discuss definition of a stroke, what the signs and symptoms are of a stroke, and how to identify when someone is having stroke.   Knowledge Questionnaire Score:  Knowledge Questionnaire Score - 11/10/20 1246       Knowledge Questionnaire Score   Pre Score 23/24             Core Components/Risk Factors/Patient Goals at Admission:  Personal Goals and Risk Factors at Admission - 11/10/20 1321       Core Components/Risk Factors/Patient Goals on Admission    Weight Management Yes;Weight Maintenance    Intervention Weight Management: Provide education and appropriate resources to help participant work on and attain dietary goals.;Weight Management: Develop a combined nutrition and exercise program designed to reach desired caloric intake, while maintaining appropriate intake of nutrient and fiber, sodium and fats, and appropriate energy expenditure required for the weight goal.    Expected Outcomes Short Term: Continue to assess and modify interventions until short term weight is achieved;Long Term: Adherence to nutrition and physical activity/exercise program aimed toward attainment of established weight goal;Weight Maintenance: Understanding of the daily nutrition guidelines, which includes 25-35% calories from fat, 7% or less cal from saturated fats, less than 200mg  cholesterol, less than 1.5gm of sodium, & 5 or more servings of fruits and vegetables daily    Improve shortness of breath with ADL's Yes    Intervention  Provide education, individualized exercise plan and daily activity instruction to help decrease symptoms of SOB with activities of daily living.    Expected Outcomes Short Term: Improve cardiorespiratory fitness to achieve a reduction of symptoms when performing ADLs;Long Term: Be able to perform more ADLs without symptoms or delay the onset of symptoms    Heart Failure Yes    Intervention Provide a combined exercise and nutrition program that is supplemented with education, support and counseling about heart failure. Directed toward relieving symptoms such as shortness of breath, decreased exercise tolerance, and extremity edema.    Expected Outcomes Improve functional capacity of  life;Short term: Attendance in program 2-3 days a week with increased exercise capacity. Reported lower sodium intake. Reported increased fruit and vegetable intake. Reports medication compliance.;Short term: Daily weights obtained and reported for increase. Utilizing diuretic protocols set by physician.;Long term: Adoption of self-care skills and reduction of barriers for early signs and symptoms recognition and intervention leading to self-care maintenance.             Core Components/Risk Factors/Patient Goals Review:   Goals and Risk Factor Review     Row Name 11/15/20 0717 12/07/20 0750 01/01/21 1511         Core Components/Risk Factors/Patient Goals Review   Personal Goals Review Weight Management/Obesity;Heart Failure;Other Weight Management/Obesity;Heart Failure;Other Weight Management/Obesity;Heart Failure;Other     Review Patient was referred to [facility] with CHF. He has multiple risk factors for CAD and is participating in the program for risk modification. He is new to the program completing 3 sessions. His personal goals for the program are to improve his strength and stamina. We will continue to monitor his program as he works towards meeting these goals. Patient has completed 12 sessions losing 2 lbs since  his initial visit. He is doing well in the program with progressions and consistent attendance. He saw his cardiologist in [facility] 12/06/20 for newly diagnosed HF. He increased his Coreg to 9.375 mg BID and added Spironolactone 12.5 mg daily. Plans to follow up with labs and will repeat Echo in 2 months. If EF remains low will consider Cardiac MRI. Patient's blood pressure and heart rate is well controlled. He reported to his MD yesterday that he is not having any SOB with exertion. His personal goals for the program are to improve his strength and stamina. He says he is not at the level he was prior to his MI but is improving. We will continue to monitor his progress as he works towards meeting these goals. Patient has completed 22 sessions maintaining his weight since last 30 day review. He continues to do well in the program with progressions and consistent attendance. Patient's blood pressure and heart rate are well controlled.  His blood pressure has decreased since medication adjustments 11/2 and are soft at times with no symptoms. His personal goals for the program are to improve his strength and stamina. He says he is not at the level he was prior to his MI but is improving. We will continue to monitor his progress as he works towards meeting these goals.     Expected Outcomes Patient will complete the program meeting both personal and program goals. Patient will complete the program meeting both personal and program goals. Patient will complete the program meeting both personal and program goals.              Core Components/Risk Factors/Patient Goals at Discharge (Final Review):   Goals and Risk Factor Review - 01/01/21 1511       Core Components/Risk Factors/Patient Goals Review   Personal Goals Review Weight Management/Obesity;Heart Failure;Other    Review Patient has completed 22 sessions maintaining his weight since last 30 day review. He continues to do well in the program with  progressions and consistent attendance. Patient's blood pressure and heart rate are well controlled.  His blood pressure has decreased since medication adjustments 11/2 and are soft at times with no symptoms. His personal goals for the program are to improve his strength and stamina. He says he is not at the level he was prior to his MI but  is improving. We will continue to monitor his progress as he works towards meeting these goals.    Expected Outcomes Patient will complete the program meeting both personal and program goals.             ITP Comments:   Comments: ITP REVIEW Pt is making expected progress toward Cardiac Rehab goals after completing 24 sessions. Recommend continued exercise, life style modification, education, and increased stamina and strength.

## 2021-01-12 ENCOUNTER — Other Ambulatory Visit (HOSPITAL_COMMUNITY): Payer: Self-pay | Admitting: *Deleted

## 2021-01-12 ENCOUNTER — Encounter (HOSPITAL_COMMUNITY)
Admission: RE | Admit: 2021-01-12 | Discharge: 2021-01-12 | Disposition: A | Discharge status: Home / Self Care | Payer: Medicare Other | Source: Ambulatory Visit | Attending: Cardiovascular Disease | Admitting: Cardiovascular Disease

## 2021-01-12 DIAGNOSIS — Z955 Presence of coronary angioplasty implant and graft: Secondary | ICD-10-CM

## 2021-01-12 DIAGNOSIS — I214 Non-ST elevation (NSTEMI) myocardial infarction: Secondary | ICD-10-CM

## 2021-01-12 MED ORDER — ENTRESTO 24-26 MG PO TABS: 1.0000 | ORAL_TABLET | Freq: Two times a day (BID) | ORAL | 3 refills | Status: DC

## 2021-01-12 NOTE — Progress Notes (Signed)
Daily Session Note  Patient Details  Name: Justin Carroll MRN: [id number] Date of Birth: Jun 04, 1955 Referring Provider:   Flowsheet Row CARDIAC REHAB PHASE II ORIENTATION from 11/10/2020 in [place]  Referring Provider Dr. Gardiner Rhyme       Encounter Date: 01/12/2021  Check In:  Session Check In - 01/12/21 0815       Check-In   Supervising physician immediately available to respond to emergencies [facility] MD immediately available    Physician(s) Dr. Harl Bowie    Location [facility]    Staff Present Aundra Dubin, RN, Bjorn Loser, MS, ACSM-CEP, Exercise Physiologist    Virtual Visit No    Medication changes reported     No    Fall or balance concerns reported    No    Tobacco Cessation No Change    Warm-up and Cool-down Performed as group-led instruction    Resistance Training Performed Yes    VAD Patient? No    PAD/SET Patient? No      Pain Assessment   Currently in Pain? No/denies    Pain Score 0-No pain    Multiple Pain Sites No             Capillary Blood Glucose: No results found for this or any previous visit (from the past 24 hour(s)).    Social History   Tobacco Use  Smoking Status Former   Packs/day: 1.50   Years: 10.00   Pack years: 15.00   Types: Cigarettes   Start date: 1995   Quit date: 2015   Years since quitting: 7.9  Smokeless Tobacco Never    Goals Met:  Independence with exercise equipment Exercise tolerated well No report of concerns or symptoms today Strength training completed today  Goals Unmet:  Not Applicable  Comments: checkout time is 0915   Dr. Kathie Dike is Medical Director for [facility].

## 2021-01-15 ENCOUNTER — Encounter (HOSPITAL_COMMUNITY)
Admission: RE | Admit: 2021-01-15 | Discharge: 2021-01-15 | Disposition: A | Discharge status: Home / Self Care | Payer: Medicare Other | Source: Ambulatory Visit | Attending: Cardiovascular Disease | Admitting: Cardiovascular Disease

## 2021-01-15 ENCOUNTER — Encounter: Payer: Self-pay | Admitting: Psychology

## 2021-01-15 DIAGNOSIS — I214 Non-ST elevation (NSTEMI) myocardial infarction: Secondary | ICD-10-CM

## 2021-01-15 DIAGNOSIS — Z955 Presence of coronary angioplasty implant and graft: Secondary | ICD-10-CM | POA: Diagnosis not present

## 2021-01-15 NOTE — Progress Notes (Signed)
Neuropsychological Consultation   Patient:   Justin Carroll   DOB:   25-Nov-1955  MR Number:  [id number]  Location:  [facility] [place], [place] [id number] [place] [address] Dept: [phone]           Date of Service:   11/22/2020  Start Time:   1 PM End Time:   3 PM  Today's visit was an in person visit that was conducted in my outpatient clinic office.  The patient and his wife are both present for this clinical interview.  1 hour and 15 minutes was spent in face-to-face clinical interview and the other 45 minutes was spent with records review, report writing and setting up testing protocols.  Provider/Observer:  Ilean Skill, Psy.D.       Clinical Neuropsychologist       Billing Code/Service: 96116/96121  Chief Complaint:    Justin Carroll is a 65 year old male referred by Larey Seat, MD for neuropsychological evaluation as part of a larger neurological work-up.  She is also been actively followed by Debbora Presto, NP who is also part of his care team with [facility].  The patient was referred for neurological assessment by his PCP because of reports of issues with attention and concentration and memory changes as well as change in mood.  The patient is described as becoming more irritable and agitated.  Patient has a past medical history that includes hyperlipidemia and diagnostic rule outs including dementia and progressive supranuclear ophthalmoplegia, frontotemporal dementia, cognitive attentional deficits.  Patient also has a past history of depression.  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  The patient has a fairly complicated neurological history with  concerns around attention and depression going back into his adolescence and young adult years, issues with attention and organization throughout his career and recent falls with concerns of possible stroke that was ruled out and worsening of attention and memory issues more recently.  The patient is also begun having more issues with geographic orientation and an overall worsening of his status.  Patient had a history of substance abuse (alcohol) but has been sober since 21.  A number of diagnostic considerations have been made over the recent past including ADHD, various forms of frontotemporal and/or other forms of dementia versus cerebrovascular changes.  Patient with recent heart attack and indications of small vessel disease on his neuro imaging studies but the most recent was very motion degraded.  Reason for Service:  Justin Carroll is a 65 year old male referred by Larey Seat, MD for neuropsychological evaluation as part of a larger neurological work-up.  She is also been actively followed by Debbora Presto, NP who is also part of his care team with [facility] neurolog[facility].  The patient was referred for neurological assessment by his PCP because of reports of issues with attention and concentration and memory changes as well as change in mood.  The patient is described as becoming more irritable and agitated.  Patient has a past medical history that includes hyperlipidemia and diagnostic rule outs including dementia and progressive supranuclear ophthalmoplegia, frontotemporal dementia, cognitive attentional deficits.  Patient also has a past history of depression.  Patient with current difficulties described as primary issues with short-term memory issues, attentional issues  and geographic disorientation at times even going to his normal places he is going to for years like the court house.  The patient is described as having very good days and very bad days and is described as usually being  better in the morning when it comes to cognitive performance.  The patient reports that there are times when he is at loss for words and sometimes skips words and has difficulty with fluency at times.  The patient had a formal sleep study conducted on 9/30 with a diagnosis of mild obstructive sleep apnea.  The patient has had a CPAP ordered and he is waiting for it to come in to start using.  The patient also had a recent heart attack as well.  Patient describes difficulty coping with everything that has been happening around his heart attack and then a) and his physician dying suddenly while on a trip to [place].  It has been a very stressful time for him with everything that occurred.  The patient is described as having a couple of falls recently and there has been an attempt at an MRI that was significantly motion degraded.  There was no particular indications of acute process during this prior neuro imaging efforts other than microvascular ischemic changes/small vessel disease and changes that are normal age-related changes.  While being followed by his neurologist, the patient had MoCA assessments in January 2021 as well as March 2021.  In the January assessment he had a score of 26 and in the March assessment he had a score of 28 with significant improvements in delayed recall.  The patient has had issues with attention and concentration and recently has been tried on a number of different strategies including Adderall which was stopped due to his heart attack.  They have thought about trying Strattera but this was not done.  There were some concerns about the patient having ADD but there were also complicating variables including a very stressful and dysfunctional family life growing up and the patient had a very tough childhood and was depressed as a high Retail buyer.  Initially Dr. Brett Fairy thought there might be issue with attention deficit disorder.  The patient's wife reports that attention and  concentration and focus have always been an issue and was not new and the patient was always rather scattered in his thinking.  The patient has been taking Wellbutrin for decades and just recently started taking Zoloft.  The patient has not been working recently following his heart attack and is still within rehab for his heart attack.  The patient reports that all of this recent assessment started after a fall 2 years ago that they thought may have been due to a stroke.  After work-up he was initially diagnosed with ADD but the patient is continued to have issues.  The patient and his wife report that they have been raising a 4-year-old after the child's father died from an overdose and the mother was still abusing.  This is been a significant stressor for the patient and his wife as well.  The patient had always been very diligent about his health and so the recent health events including the patient's heart attack and had a big impact.  The patient's father died of a "massive heart attack" and his younger brother died of a stroke.  The patient has a history of alcohol abuse and dependency but has gone quite some time without having any alcohol.  He is an active participant in alcoholics  anonymous.   Behavioral Observation: LORN BUTCHER  presents as a 65 y.o.-year-old Left handed Caucasian Male who appeared his stated age. his dress was Appropriate and he was Well Groomed and his manners were Appropriate to the situation.  his participation was indicative of Appropriate behaviors.  There were not physical disabilities noted.  he displayed an appropriate level of cooperation and motivation.     Interactions:    Active Redirectable  Attention:   abnormal and attention span appeared shorter than expected for age  Memory:   abnormal; remote memory intact, recent memory impaired  Visuo-spatial:  not examined  Speech (Volume):  normal  Speech:   normal; some word finding issues noted  Thought  Process:  Coherent and Relevant  Though Content:  WNL; not suicidal and not homicidal  Orientation:   person, place, time/date, and situation  Judgment:   Good  Planning:   Good  Affect:    Appropriate  Mood:    Dysphoric  Insight:   Good  Intelligence:   very high  Marital Status/Living: The patient was born and raised in [place] along with 3 siblings.  He is married to his wife of 12 years and they have lived together along with a 76-year-old daughter that they are raising.  The patient has 2 biological children both of whom have significant mental health issues and addiction.  The youngest has been diagnosed with ADHD in the oldest with depression.  There are 5 other children that 2 of whom are adopted and 3 are stepchildren.  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  Hobbies and interest include reading, watching classic movies, spending time with children and travel.  Substance Use:  There is a documented history of alcohol abuse confirmed by the patient.  The patient has been sober for many years (since 1) and quit tobacco use 7 years ago.  Education:   The patient graduated from [employer] and then attended [employer] where he finished in the top half of his class.  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  She is also been actively followed by Debbora Presto, NP  who is also part of his care team with [facility].  The patient was referred for neurological assessment by his PCP because of reports of issues with attention and concentration and memory changes as well as change in mood.  The patient is described as becoming more irritable and agitated.  Patient has a past medical history that includes hyperlipidemia and diagnostic rule outs including dementia and progressive supranuclear ophthalmoplegia, frontotemporal dementia, cognitive attentional deficits.  Patient also has a past history of depression.  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  The patient has a fairly complicated neurological history with concerns around attention and depression going back into his adolescence and young adult years, issues with attention and organization throughout his career and recent falls with concerns of possible stroke that was ruled out and worsening of attention and memory issues more recently.  The patient is also begun having more issues with geographic orientation and an overall worsening of his status.  Patient had a history of substance abuse (alcohol)  but has been sober since 88.  A number of diagnostic considerations have been made over the recent past including ADHD, various forms of frontotemporal and/or other forms of dementia versus cerebrovascular changes.  Patient with recent heart attack and indications of small vessel disease on his neuro imaging studies but the most recent was very motion degraded.  Disposition/Plan: We have set the patient up for formal neuropsychological testing to aid in differential diagnostic considerations and treatment recommendations.  Once these formal testing has been completed a formal report will be produced with diagnostic and recommendations included.   Diagnosis:    Memory loss, short term  Attention and concentration deficit         Electronically Signed   _______________________ Ilean Skill, Psy.D. Clinical Neuropsychologist

## 2021-01-15 NOTE — Progress Notes (Signed)
Daily Session Note  Patient Details  Name: Justin Carroll MRN: [id number] Date of Birth: 1955-09-17 Referring Provider:   Flowsheet Row CARDIAC REHAB PHASE II ORIENTATION from 11/10/2020 in [facility]  Referring Provider Dr. Gardiner Rhyme       Encounter Date: 01/15/2021  Check In:  Session Check In - 01/15/21 0815       Check-In   Supervising physician immediately available to respond to emergencies [facility] MD immediately available    Physician(s) Dr. Domenic Polite    Location [facility]    Staff Present Hoy Register, MS, ACSM-CEP, Exercise Physiologist;Manav Pierotti Zigmund Daniel, Exercise Physiologist    Virtual Visit No    Medication changes reported     No    Fall or balance concerns reported    No    Tobacco Cessation No Change    Warm-up and Cool-down Performed as group-led instruction    Resistance Training Performed Yes    VAD Patient? No    PAD/SET Patient? No      Pain Assessment   Currently in Pain? No/denies    Pain Score 0-No pain    Multiple Pain Sites No             Capillary Blood Glucose: No results found for this or any previous visit (from the past 24 hour(s)).    Social History   Tobacco Use  Smoking Status Former   Packs/day: 1.50   Years: 10.00   Pack years: 15.00   Types: Cigarettes   Start date: 1995   Quit date: 2015   Years since quitting: 7.9  Smokeless Tobacco Never    Goals Met:  Independence with exercise equipment Exercise tolerated well No report of concerns or symptoms today Strength training completed today  Goals Unmet:  Not Applicable  Comments: check out 0915   Dr. Kathie Dike is Medical Director for [facility].

## 2021-01-16 ENCOUNTER — Telehealth: Payer: Self-pay | Admitting: Pharmacist Clinician (PhC)/ Clinical Pharmacy Specialist

## 2021-01-16 ENCOUNTER — Telehealth (HOSPITAL_COMMUNITY): Payer: Self-pay | Admitting: Pharmacy Technician

## 2021-01-16 MED ORDER — REPATHA SURECLICK 140 MG/ML ~~LOC~~ SOAJ: 140.0000 mg | SUBCUTANEOUS | 12 refills | Status: DC

## 2021-01-16 NOTE — Telephone Encounter (Signed)
[place] PA approved

## 2021-01-16 NOTE — Telephone Encounter (Addendum)
Advanced Heart Failure Patient Advocate Encounter  Received a notice from [employer] that it is time to renew [place] assistance. That was sent in along with POI on 12/09.  Charlann Boxer, CPhT

## 2021-01-17 ENCOUNTER — Encounter (HOSPITAL_COMMUNITY)
Admission: RE | Admit: 2021-01-17 | Discharge: 2021-01-17 | Disposition: A | Discharge status: Home / Self Care | Payer: Medicare Other | Source: Ambulatory Visit | Attending: Cardiovascular Disease | Admitting: Cardiovascular Disease

## 2021-01-17 DIAGNOSIS — I214 Non-ST elevation (NSTEMI) myocardial infarction: Secondary | ICD-10-CM

## 2021-01-17 DIAGNOSIS — Z955 Presence of coronary angioplasty implant and graft: Secondary | ICD-10-CM

## 2021-01-17 NOTE — Progress Notes (Signed)
Daily Session Note  Patient Details  Name: Justin Carroll MRN: [id number] Date of Birth: [birth year] Referring Provider:   Flowsheet Row CARDIAC REHAB PHASE II ORIENTATION from 11/10/2020 in [place]  Referring Provider Dr. Gardiner Rhyme       Encounter Date: 01/17/2021  Check In:  Session Check In - 01/17/21 0815       Check-In   Supervising physician immediately available to respond to emergencies [facility] MD immediately available    Physician(s) Dr. Donne Anon    Location [facility]    Staff Present Geanie Cooley, RN;Heather Otho Ket, BS, Exercise Physiologist;Debra Wynetta Emery, RN, BSN    Virtual Visit No    Medication changes reported     No    Fall or balance concerns reported    No    Tobacco Cessation No Change    Warm-up and Cool-down Performed as group-led instruction    Resistance Training Performed Yes    VAD Patient? No    PAD/SET Patient? No      Pain Assessment   Currently in Pain? No/denies    Pain Score 0-No pain    Multiple Pain Sites No             Capillary Blood Glucose: No results found for this or any previous visit (from the past 24 hour(s)).    Social History   Tobacco Use  Smoking Status Former   Packs/day: 1.50   Years: 10.00   Pack years: 15.00   Types: Cigarettes   Start date: 1995   Quit date: 2015   Years since quitting: 7.9  Smokeless Tobacco Never    Goals Met:  Independence with exercise equipment Exercise tolerated well No report of concerns or symptoms today Strength training completed today  Goals Unmet:  Not Applicable  Comments: check out @ 9:15am   Dr. Kathie Dike is Medical Director for [facility].

## 2021-01-19 ENCOUNTER — Encounter (HOSPITAL_COMMUNITY)
Admission: RE | Admit: 2021-01-19 | Discharge: 2021-01-19 | Disposition: A | Discharge status: Home / Self Care | Payer: Medicare Other | Source: Ambulatory Visit | Attending: Cardiovascular Disease | Admitting: Cardiovascular Disease

## 2021-01-19 ENCOUNTER — Other Ambulatory Visit: Payer: Self-pay

## 2021-01-19 DIAGNOSIS — Z955 Presence of coronary angioplasty implant and graft: Secondary | ICD-10-CM | POA: Diagnosis not present

## 2021-01-19 DIAGNOSIS — I214 Non-ST elevation (NSTEMI) myocardial infarction: Secondary | ICD-10-CM

## 2021-01-19 NOTE — Progress Notes (Signed)
Daily Session Note ° °Patient Details  °Name: Morgan E Neglia °MRN: [id number] °Date of Birth: 09/23/1955 °Referring Provider:   °Flowsheet Row CARDIAC REHAB PHASE II ORIENTATION from 11/10/2020 in [facility]  °Referring Provider Dr. Schumann  ° °  ° ° °Encounter Date: 01/19/2021 ° °Check In: ° Session Check In - 01/19/21 0815   ° °  ° Check-In  ° Supervising physician immediately available to respond to emergencies [facility] MD immediately available   ° Physician(s) Dr. McDowell   ° Location [facility]   ° Staff Present Dalton Fletcher, MS, ACSM-CEP, Exercise Physiologist; , BS, Exercise Physiologist   ° Virtual Visit No   ° Medication changes reported     No   ° Fall or balance concerns reported    No   ° Tobacco Cessation No Change   ° Warm-up and Cool-down Performed as group-led instruction   ° Resistance Training Performed Yes   ° VAD Patient? No   ° PAD/SET Patient? No   °  ° Pain Assessment  ° Currently in Pain? No/denies   ° Pain Score 0-No pain   ° Multiple Pain Sites No   ° °  °  ° °  ° ° °Capillary Blood Glucose: °No results found for this or any previous visit (from the past 24 hour(s)). ° ° ° °Social History  ° °Tobacco Use  °Smoking Status Former  ° Packs/day: 1.50  ° Years: 10.00  ° Pack years: 15.00  ° Types: Cigarettes  ° Start date: 1995  ° Quit date: 2015  ° Years since quitting: 7.9  °Smokeless Tobacco Never  ° ° °Goals Met:  °Independence with exercise equipment °Exercise tolerated well °Personal goals reviewed °Strength training completed today ° °Goals Unmet:  °Not Applicable ° °Comments: check out 0915 ° ° °Dr. Jehanzeb Memon is Medical Director for [facility]. °

## 2021-01-22 ENCOUNTER — Encounter (HOSPITAL_COMMUNITY)
Admission: RE | Admit: 2021-01-22 | Discharge: 2021-01-22 | Disposition: A | Discharge status: Home / Self Care | Payer: Medicare Other | Source: Ambulatory Visit | Attending: Cardiovascular Disease | Admitting: Cardiovascular Disease

## 2021-01-22 VITALS — Wt 172.0 lb

## 2021-01-22 DIAGNOSIS — I214 Non-ST elevation (NSTEMI) myocardial infarction: Secondary | ICD-10-CM

## 2021-01-22 DIAGNOSIS — Z955 Presence of coronary angioplasty implant and graft: Secondary | ICD-10-CM

## 2021-01-22 NOTE — Progress Notes (Signed)
Daily Session Note  Patient Details  Name: Justin Carroll MRN: [id number] Date of Birth: Dec 06, 1955 Referring Provider:   Flowsheet Row CARDIAC REHAB PHASE II ORIENTATION from 11/10/2020 in [facility]  Referring Provider Dr. Gardiner Rhyme       Encounter Date: 01/22/2021  Check In:  Session Check In - 01/22/21 0815       Check-In   Supervising physician immediately available to respond to emergencies [facility] MD immediately available    Physician(s) Dr. Johnsie Cancel    Location [facility]    Staff Present Hoy Register, MS, ACSM-CEP, Exercise Physiologist;Heather Zigmund Daniel, Exercise Physiologist;Roise Emert Wynetta Emery, RN, BSN    Virtual Visit No    Medication changes reported     No    Fall or balance concerns reported    No    Tobacco Cessation No Change    Warm-up and Cool-down Performed as group-led instruction    Resistance Training Performed Yes    VAD Patient? No    PAD/SET Patient? No      Pain Assessment   Currently in Pain? No/denies    Pain Score 0-No pain    Multiple Pain Sites No             Capillary Blood Glucose: No results found for this or any previous visit (from the past 24 hour(s)).    Social History   Tobacco Use  Smoking Status Former   Packs/day: 1.50   Years: 10.00   Pack years: 15.00   Types: Cigarettes   Start date: 1995   Quit date: 2015   Years since quitting: 7.9  Smokeless Tobacco Never    Goals Met:  Independence with exercise equipment Exercise tolerated well No report of concerns or symptoms today Strength training completed today  Goals Unmet:  Not Applicable  Comments: Check out 915.   Dr. Kathie Dike is Medical Director for [facility].

## 2021-01-24 ENCOUNTER — Telehealth (HOSPITAL_COMMUNITY): Payer: Self-pay | Admitting: Pharmacist

## 2021-01-24 ENCOUNTER — Encounter (HOSPITAL_COMMUNITY)
Admission: RE | Admit: 2021-01-24 | Discharge: 2021-01-24 | Disposition: A | Discharge status: Home / Self Care | Payer: Medicare Other | Source: Ambulatory Visit | Attending: Cardiovascular Disease | Admitting: Cardiovascular Disease

## 2021-01-24 ENCOUNTER — Ambulatory Visit (HOSPITAL_COMMUNITY)
Admission: RE | Admit: 2021-01-24 | Discharge: 2021-01-24 | Disposition: A | Discharge status: Home / Self Care | Payer: Medicare Other | Source: Ambulatory Visit | Attending: Internal Medicine | Admitting: Internal Medicine

## 2021-01-24 ENCOUNTER — Other Ambulatory Visit: Payer: Self-pay

## 2021-01-24 VITALS — BP 132/82 | HR 71 | Wt 171.2 lb

## 2021-01-24 DIAGNOSIS — I2510 Atherosclerotic heart disease of native coronary artery without angina pectoris: Secondary | ICD-10-CM | POA: Insufficient documentation

## 2021-01-24 DIAGNOSIS — G4733 Obstructive sleep apnea (adult) (pediatric): Secondary | ICD-10-CM | POA: Insufficient documentation

## 2021-01-24 DIAGNOSIS — I252 Old myocardial infarction: Secondary | ICD-10-CM | POA: Diagnosis not present

## 2021-01-24 DIAGNOSIS — I428 Other cardiomyopathies: Secondary | ICD-10-CM | POA: Insufficient documentation

## 2021-01-24 DIAGNOSIS — Z7982 Long term (current) use of aspirin: Secondary | ICD-10-CM | POA: Diagnosis not present

## 2021-01-24 DIAGNOSIS — F32A Depression, unspecified: Secondary | ICD-10-CM | POA: Insufficient documentation

## 2021-01-24 DIAGNOSIS — I5022 Chronic systolic (congestive) heart failure: Secondary | ICD-10-CM | POA: Diagnosis present

## 2021-01-24 DIAGNOSIS — Z955 Presence of coronary angioplasty implant and graft: Secondary | ICD-10-CM

## 2021-01-24 DIAGNOSIS — Z7984 Long term (current) use of oral hypoglycemic drugs: Secondary | ICD-10-CM | POA: Diagnosis not present

## 2021-01-24 DIAGNOSIS — N183 Chronic kidney disease, stage 3 unspecified: Secondary | ICD-10-CM | POA: Diagnosis not present

## 2021-01-24 DIAGNOSIS — Z79899 Other long term (current) drug therapy: Secondary | ICD-10-CM | POA: Insufficient documentation

## 2021-01-24 DIAGNOSIS — I214 Non-ST elevation (NSTEMI) myocardial infarction: Secondary | ICD-10-CM

## 2021-01-24 MED ORDER — SPIRONOLACTONE 25 MG PO TABS: 25.0000 mg | ORAL_TABLET | Freq: Every day | ORAL | 11 refills | Status: DC

## 2021-01-24 NOTE — Telephone Encounter (Signed)
BMET order for 02/01/21 placed.   Audry Riles, PharmD, BCPS, BCCP, CPP Heart Failure Clinic Pharmacist [phone]

## 2021-01-24 NOTE — Patient Instructions (Signed)
It was a pleasure seeing you today!  MEDICATIONS: -We are changing your medications today -Increase spironolactone to 25 mg (1 tablet) daily. -Call if you have questions about your medications.   NEXT APPOINTMENT: Return to clinic in 4 weeks with Dr. Aundra Dubin.  In general, to take care of your heart failure: -Limit your fluid intake to 2 Liters (half-gallon) per day.   -Limit your salt intake to ideally 2-3 grams (2000-3000 mg) per day. -Weigh yourself daily and record, and bring that "weight diary" to your next appointment.  (Weight gain of 2-3 pounds in 1 day typically means fluid weight.) -The medications for your heart are to help your heart and help you live longer.   -Please contact us before stopping any of your heart medications.  Call the clinic at [phone] with questions or to reschedule future appointments.

## 2021-01-24 NOTE — Progress Notes (Signed)
Daily Session Note  Patient Details  Name: Justin Carroll MRN: [id number] Date of Birth: 03-13-1955 Referring Provider:   Flowsheet Row CARDIAC REHAB PHASE II ORIENTATION from 11/10/2020 in [facility]  Referring Provider Dr. Gardiner Rhyme       Encounter Date: 01/24/2021  Check In:  Session Check In - 01/24/21 0808       Check-In   Supervising physician immediately available to respond to emergencies [facility] MD immediately available    Physician(s) Dr. Harrington Challenger    Location [facility]    Staff Present Hoy Register, MS, ACSM-CEP, Exercise Physiologist;Heather Otho Ket, BS, Exercise Physiologist;Kenzy Campoverde Wynetta Emery, RN, BSN    Virtual Visit No    Medication changes reported     No    Fall or balance concerns reported    No    Tobacco Cessation No Change    Warm-up and Cool-down Performed as group-led instruction    Resistance Training Performed Yes    VAD Patient? No    PAD/SET Patient? No      Pain Assessment   Currently in Pain? No/denies    Pain Score 0-No pain    Multiple Pain Sites No             Capillary Blood Glucose: No results found for this or any previous visit (from the past 24 hour(s)).    Social History   Tobacco Use  Smoking Status Former   Packs/day: 1.50   Years: 10.00   Pack years: 15.00   Types: Cigarettes   Start date: 1995   Quit date: 2015   Years since quitting: 7.9  Smokeless Tobacco Never    Goals Met:  Independence with exercise equipment Exercise tolerated well No report of concerns or symptoms today Strength training completed today  Goals Unmet:  Not Applicable  Comments: Check out 915.   Dr. Kathie Dike is Medical Director for [facility].

## 2021-01-26 ENCOUNTER — Encounter (HOSPITAL_COMMUNITY): Payer: Medicare Other

## 2021-01-29 ENCOUNTER — Encounter (HOSPITAL_COMMUNITY): Payer: Medicare Other

## 2021-01-31 ENCOUNTER — Encounter (HOSPITAL_COMMUNITY): Payer: Medicare Other

## 2021-02-01 ENCOUNTER — Other Ambulatory Visit (HOSPITAL_COMMUNITY): Payer: Medicare Other

## 2021-02-02 ENCOUNTER — Encounter (HOSPITAL_COMMUNITY)
Admission: RE | Admit: 2021-02-02 | Discharge: 2021-02-02 | Disposition: A | Discharge status: Home / Self Care | Payer: Medicare Other | Source: Ambulatory Visit | Attending: Cardiovascular Disease | Admitting: Cardiovascular Disease

## 2021-02-02 DIAGNOSIS — Z955 Presence of coronary angioplasty implant and graft: Secondary | ICD-10-CM | POA: Diagnosis not present

## 2021-02-02 DIAGNOSIS — I214 Non-ST elevation (NSTEMI) myocardial infarction: Secondary | ICD-10-CM

## 2021-02-02 NOTE — Progress Notes (Signed)
Daily Session Note  Patient Details  Name: Justin Carroll MRN: [id number] Date of Birth: 1955-10-21 Referring Provider:   Flowsheet Row CARDIAC REHAB PHASE II ORIENTATION from 11/10/2020 in [facility]  Referring Provider Dr. Gardiner Rhyme       Encounter Date: 02/02/2021  Check In:  Session Check In - 02/02/21 0815       Check-In   Supervising physician immediately available to respond to emergencies [facility] MD immediately available    Physician(s) Dr. Johnsie Cancel    Location [facility]    Staff Present Hoy Register, MS, ACSM-CEP, Exercise Physiologist;Khamia Stambaugh Zigmund Daniel, Exercise Physiologist;Other    Virtual Visit No    Medication changes reported     No    Fall or balance concerns reported    No    Tobacco Cessation No Change    Warm-up and Cool-down Performed as group-led instruction    Resistance Training Performed Yes    VAD Patient? No    PAD/SET Patient? No      Pain Assessment   Currently in Pain? No/denies    Pain Score 0-No pain    Multiple Pain Sites No             Capillary Blood Glucose: No results found for this or any previous visit (from the past 24 hour(s)).    Social History   Tobacco Use  Smoking Status Former   Packs/day: 1.50   Years: 10.00   Pack years: 15.00   Types: Cigarettes   Start date: 1995   Quit date: 2015   Years since quitting: 8.0  Smokeless Tobacco Never    Goals Met:  Independence with exercise equipment Exercise tolerated well No report of concerns or symptoms today Strength training completed today  Goals Unmet:  Not Applicable  Comments: check out 0915   Dr. Kathie Dike is Medical Director for [facility].

## 2021-02-07 ENCOUNTER — Ambulatory Visit (HOSPITAL_COMMUNITY)
Admission: RE | Admit: 2021-02-07 | Discharge: 2021-02-07 | Disposition: A | Discharge status: Home / Self Care | Payer: Medicare Other | Source: Ambulatory Visit | Attending: Cardiology | Admitting: Cardiology

## 2021-02-07 ENCOUNTER — Other Ambulatory Visit: Payer: Self-pay

## 2021-02-07 DIAGNOSIS — I5022 Chronic systolic (congestive) heart failure: Secondary | ICD-10-CM | POA: Diagnosis not present

## 2021-02-07 LAB — BASIC METABOLIC PANEL
Anion gap: 7 (ref 5–15)
BUN: 16 mg/dL (ref 8–23)
CO2: 25 mmol/L (ref 22–32)
Calcium: 8.6 mg/dL — ABNORMAL LOW (ref 8.9–10.3)
Chloride: 103 mmol/L (ref 98–111)
Creatinine, Ser: 1.28 mg/dL — ABNORMAL HIGH (ref 0.61–1.24)
GFR, Estimated: >60 mL/min (ref >60)
Glucose, Bld: 89 mg/dL (ref 70–99)
Potassium: 4.4 mmol/L (ref 3.5–5.1)
Sodium: 135 mmol/L (ref 135–145)

## 2021-02-09 ENCOUNTER — Encounter (HOSPITAL_COMMUNITY)
Admission: RE | Admit: 2021-02-09 | Discharge: 2021-02-09 | Disposition: A | Discharge status: Home / Self Care | Payer: Medicare Other | Source: Ambulatory Visit | Attending: Cardiovascular Disease | Admitting: Cardiovascular Disease

## 2021-02-09 VITALS — Ht 67.0 in | Wt 171.7 lb

## 2021-02-09 DIAGNOSIS — Z955 Presence of coronary angioplasty implant and graft: Secondary | ICD-10-CM | POA: Diagnosis present

## 2021-02-09 DIAGNOSIS — I214 Non-ST elevation (NSTEMI) myocardial infarction: Secondary | ICD-10-CM | POA: Insufficient documentation

## 2021-02-09 NOTE — Progress Notes (Signed)
Daily Session Note  Patient Details  Name: Justin Carroll MRN: [id number] Date of Birth: June 03, 1955 Referring Provider:   Flowsheet Row CARDIAC REHAB PHASE II ORIENTATION from 11/10/2020 in [facility]  Referring Provider Dr. Gardiner Rhyme       Encounter Date: 02/09/2021  Check In:  Session Check In - 02/09/21 0815       Check-In   Supervising physician immediately available to respond to emergencies [facility] MD immediately available    Physician(s) Dr. Gardiner Rhyme    Location [facility]    Staff Present Geanie Cooley, RN;Dalton Fletcher, MS, ACSM-CEP, Exercise Physiologist    Virtual Visit No    Medication changes reported     No    Fall or balance concerns reported    No    Tobacco Cessation No Change    Warm-up and Cool-down Performed as group-led instruction    Resistance Training Performed Yes    VAD Patient? No      Pain Assessment   Currently in Pain? No/denies    Pain Score 0-No pain    Multiple Pain Sites No             Capillary Blood Glucose: No results found for this or any previous visit (from the past 24 hour(s)).    Social History   Tobacco Use  Smoking Status Former   Packs/day: 1.50   Years: 10.00   Pack years: 15.00   Types: Cigarettes   Start date: 1995   Quit date: 2015   Years since quitting: 8.0  Smokeless Tobacco Never    Goals Met:  Independence with exercise equipment Exercise tolerated well No report of concerns or symptoms today Strength training completed today  Goals Unmet:  Not Applicable  Comments: check out @ 9:15am   Dr. Kathie Dike is Medical Director for [facility] Pulmonary Re[facility].

## 2021-02-12 NOTE — Progress Notes (Signed)
Discharge Progress Report  Patient Details  Name: Justin Carroll MRN: [id number] Date of Birth: [birth year] Referring Provider:   Flowsheet Row CARDIAC REHAB PHASE II ORIENTATION from 11/10/2020 in [facility]  Referring Provider Dr. Gardiner Rhyme        Number of Visits: 34  Reason for Discharge:  Patient reached a stable level of exercise. Patient independent in their exercise. Patient has met program and personal goals.  Smoking History:  Social History   Tobacco Use  Smoking Status Former   Packs/day: 1.50   Years: 10.00   Pack years: 15.00   Types: Cigarettes   Start date: 1995   Quit date: 2015   Years since quitting: 8.0  Smokeless Tobacco Never    Diagnosis:  NSTEMI (non-ST elevated myocardial infarction) ([place])  Status post coronary artery stent placement  ADL [facility]:   Initial Exercise Prescription:  Initial Exercise Prescription - 11/10/20 1400       Date of Initial Exercise RX and Referring Provider   Date 11/10/20    Referring Provider Dr. Gardiner Rhyme    Expected Discharge Date 02/02/21      Treadmill   MPH 2.3    Grade 0    Minutes 22    METs 0      Recumbant Elliptical   Level 1    RPM 60    Minutes 17    METs 0      Prescription Details   Frequency (times per week) 3    Duration Progress to 30 minutes of continuous aerobic without signs/symptoms of physical distress      Intensity   THRR 40-80% of Max Heartrate 61-122    Ratings of Perceived Exertion 11-15    Perceived Dyspnea 0-4      Progression   Progression Continue to progress workloads to maintain intensity without signs/symptoms of physical distress.      Resistance Training   Training Prescription Yes    Weight 4    Reps 10-15             Discharge Exercise Prescription (Final Exercise Prescription Changes):  Exercise Prescription Changes - 01/22/21 0815       Response to Exercise   Blood Pressure (Admit) 94/52    Blood Pressure (Exercise)  100/70    Blood Pressure (Exit) 92/58    Heart Rate (Admit) 69 bpm    Heart Rate (Exercise) 98 bpm    Heart Rate (Exit) 78 bpm    Rating of Perceived Exertion (Exercise) 11    Duration Continue with 30 min of aerobic exercise without signs/symptoms of physical distress.    Intensity THRR unchanged      Progression   Progression Continue to progress workloads to maintain intensity without signs/symptoms of physical distress.      Resistance Training   Training Prescription Yes    Weight 5    Reps 10-15    Time 10 Minutes      Treadmill   MPH 2.9    Grade 0    Minutes 17    METs 3.22      NuStep   Level 3    SPM 104    Minutes 22    METs 3.09             Functional Capacity:  6 Minute Walk     Row Name 11/10/20 1402 02/09/21 1133       6 Minute Walk   Phase Initial Discharge    Distance 1500  feet 1800 feet    Distance % Change -- 20 %    Distance Feet Change -- 300 ft    Walk Time 6 minutes 6 minutes    # of Rest Breaks 0 0    MPH 2.8 3.41    METS 3.17 3.72    RPE 12 11    VO2 Peak 11.12 13.02    Symptoms No No    Resting HR 76 bpm 61 bpm    Resting BP 104/56 116/58    Resting Oxygen Saturation  96 % 96 %    Exercise Oxygen Saturation  during 6 min walk 95 % 96 %    Max Ex. HR 82 bpm 75 bpm    Max Ex. BP 120/60 130/60    2 Minute Post BP 100/58 106/62             Psychological, QOL, Others - Outcomes: PHQ 2/9: Depression screen [facility] 2/9 11/10/2020 05/17/2020  Decreased Interest 1 0  Down, Depressed, Hopeless 0 0  PHQ - 2 Score 1 0  Altered sleeping 1 0  Tired, decreased energy 2 0  Change in appetite 1 0  Feeling bad or failure about yourself  0 0  Trouble concentrating 1 0  Moving slowly or fidgety/restless 0 0  Suicidal thoughts 0 0  PHQ-9 Score 6 0  Difficult doing work/chores Very difficult Not difficult at all    Quality of Life:  Quality of Life - 02/12/21 0821       Quality of Life Scores   Health/Function Pre 15.6 %     Health/Function Post 26.4 %    Health/Function % Change 69.23 %    Socioeconomic Pre 20.5 %    Socioeconomic Post 26.57 %    Socioeconomic % Change  29.61 %    Psych/Spiritual Pre 21.43 %    Psych/Spiritual Post 28.29 %    Psych/Spiritual % Change 32.01 %    Family Pre 22.8 %    Family Post 27.6 %    Family % Change 21.05 %    GLOBAL Pre 18.87 %    GLOBAL Post 27 %    GLOBAL % Change 43.08 %             Personal Goals: Goals established at orientation with interventions provided to work toward goal.  Personal Goals and Risk Factors at Admission - 11/10/20 1321       Core Components/Risk Factors/Patient Goals on Admission    Weight Management Yes;Weight Maintenance    Intervention Weight Management: Provide education and appropriate resources to help participant work on and attain dietary goals.;Weight Management: Develop a combined nutrition and exercise program designed to reach desired caloric intake, while maintaining appropriate intake of nutrient and fiber, sodium and fats, and appropriate energy expenditure required for the weight goal.    Expected Outcomes Short Term: Continue to assess and modify interventions until short term weight is achieved;Long Term: Adherence to nutrition and physical activity/exercise program aimed toward attainment of established weight goal;Weight Maintenance: Understanding of the daily nutrition guidelines, which includes 25-35% calories from fat, 7% or less cal from saturated fats, less than 242m cholesterol, less than 1.5gm of sodium, & 5 or more servings of fruits and vegetables daily    Improve shortness of breath with ADL's Yes    Intervention Provide education, individualized exercise plan and daily activity instruction to help decrease symptoms of SOB with activities of daily living.    Expected Outcomes Short Term: Improve cardiorespiratory  fitness to achieve a reduction of symptoms when performing ADLs;Long Term: Be able to perform more  ADLs without symptoms or delay the onset of symptoms    Heart Failure Yes    Intervention Provide a combined exercise and nutrition program that is supplemented with education, support and counseling about heart failure. Directed toward relieving symptoms such as shortness of breath, decreased exercise tolerance, and extremity edema.    Expected Outcomes Improve functional capacity of life;Short term: Attendance in program 2-3 days a week with increased exercise capacity. Reported lower sodium intake. Reported increased fruit and vegetable intake. Reports medication compliance.;Short term: Daily weights obtained and reported for increase. Utilizing diuretic protocols set by physician.;Long term: Adoption of self-care skills and reduction of barriers for early signs and symptoms recognition and intervention leading to self-care maintenance.              Personal Goals Discharge:  Goals and Risk Factor Review     Row Name 11/15/20 0717 12/07/20 0750 01/01/21 1511 02/12/21 0825       Core Components/Risk Factors/Patient Goals Review   Personal Goals Review Weight Management/Obesity;Heart Failure;Other Weight Management/Obesity;Heart Failure;Other Weight Management/Obesity;Heart Failure;Other Weight Management/Obesity;Heart Failure;Other    Review Patient was referred to [facility] with CHF. He has multiple risk factors for CAD and is participating in the program for risk modification. He is new to the program completing 3 sessions. His personal goals for the program are to improve his strength and stamina. We will continue to monitor his program as he works towards meeting these goals. Patient has completed 12 sessions losing 2 lbs since his initial visit. He is doing well in the program with progressions and consistent attendance. He saw his cardiologist in [facility] 12/06/20 for newly diagnosed HF. He increased his Coreg to 9.375 mg BID and added Spironolactone 12.5 mg daily. Plans to follow up with labs  and will repeat Echo in 2 months. If EF remains low will consider Cardiac MRI. Patient's blood pressure and heart rate is well controlled. He reported to his MD yesterday that he is not having any SOB with exertion. His personal goals for the program are to improve his strength and stamina. He says he is not at the level he was prior to his MI but is improving. We will continue to monitor his progress as he works towards meeting these goals. Patient has completed 22 sessions maintaining his weight since last 30 day review. He continues to do well in the program with progressions and consistent attendance. Patient's blood pressure and heart rate are well controlled.  His blood pressure has decreased since medication adjustments 11/2 and are soft at times with no symptoms. His personal goals for the program are to improve his strength and stamina. He says he is not at the level he was prior to his MI but is improving. We will continue to monitor his progress as he works towards meeting these goals. Pt graduated rehab after 34 sessions. His vitals were WNLs during his time in the program. He lost 3 lbs while he was in the program. 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 He reports that he has made several lifestyle changes and will be continuing to exercise at the [facility] in [place].    Expected Outcomes Patient will complete the program meeting both personal and program goals. Patient will complete the program meeting both personal and program goals. Patient will complete the program meeting both  personal and program goals. Pt will continue to work towards their goals post discharge.             Exercise Goals and Review:  Exercise Goals     Row Name 11/10/20 1407 11/13/20 1242 12/11/20 1249 01/08/21 1320       Exercise Goals   Increase Physical Activity Yes Yes Yes Yes    Intervention Provide advice, education, support and counseling about  physical activity/exercise needs.;Develop an individualized exercise prescription for aerobic and resistive training based on initial evaluation findings, risk stratification, comorbidities and participant's personal goals. Provide advice, education, support and counseling about physical activity/exercise needs.;Develop an individualized exercise prescription for aerobic and resistive training based on initial evaluation findings, risk stratification, comorbidities and participant's personal goals. Provide advice, education, support and counseling about physical activity/exercise needs.;Develop an individualized exercise prescription for aerobic and resistive training based on initial evaluation findings, risk stratification, comorbidities and participant's personal goals. Provide advice, education, support and counseling about physical activity/exercise needs.;Develop an individualized exercise prescription for aerobic and resistive training based on initial evaluation findings, risk stratification, comorbidities and participant's personal goals.    Expected Outcomes Short Term: Attend rehab on a regular basis to increase amount of physical activity.;Long Term: Add in home exercise to make exercise part of routine and to increase amount of physical activity.;Long Term: Exercising regularly at least 3-5 days a week. Short Term: Attend rehab on a regular basis to increase amount of physical activity.;Long Term: Add in home exercise to make exercise part of routine and to increase amount of physical activity.;Long Term: Exercising regularly at least 3-5 days a week. Short Term: Attend rehab on a regular basis to increase amount of physical activity.;Long Term: Add in home exercise to make exercise part of routine and to increase amount of physical activity.;Long Term: Exercising regularly at least 3-5 days a week. Short Term: Attend rehab on a regular basis to increase amount of physical activity.;Long Term: Add in  home exercise to make exercise part of routine and to increase amount of physical activity.;Long Term: Exercising regularly at least 3-5 days a week.    Increase Strength and Stamina Yes Yes Yes Yes    Intervention Provide advice, education, support and counseling about physical activity/exercise needs.;Develop an individualized exercise prescription for aerobic and resistive training based on initial evaluation findings, risk stratification, comorbidities and participant's personal goals. Provide advice, education, support and counseling about physical activity/exercise needs.;Develop an individualized exercise prescription for aerobic and resistive training based on initial evaluation findings, risk stratification, comorbidities and participant's personal goals. Provide advice, education, support and counseling about physical activity/exercise needs.;Develop an individualized exercise prescription for aerobic and resistive training based on initial evaluation findings, risk stratification, comorbidities and participant's personal goals. Provide advice, education, support and counseling about physical activity/exercise needs.;Develop an individualized exercise prescription for aerobic and resistive training based on initial evaluation findings, risk stratification, comorbidities and participant's personal goals.    Expected Outcomes Short Term: Increase workloads from initial exercise prescription for resistance, speed, and METs.;Short Term: Perform resistance training exercises routinely during rehab and add in resistance training at home;Long Term: Improve cardiorespiratory fitness, muscular endurance and strength as measured by increased METs and functional capacity (6MWT) Short Term: Increase workloads from initial exercise prescription for resistance, speed, and METs.;Short Term: Perform resistance training exercises routinely during rehab and add in resistance training at home;Long Term: Improve  cardiorespiratory fitness, muscular endurance and strength as measured by increased METs and functional capacity (6MWT) Short Term:  Increase workloads from initial exercise prescription for resistance, speed, and METs.;Short Term: Perform resistance training exercises routinely during rehab and add in resistance training at home;Long Term: Improve cardiorespiratory fitness, muscular endurance and strength as measured by increased METs and functional capacity (6MWT) Short Term: Increase workloads from initial exercise prescription for resistance, speed, and METs.;Short Term: Perform resistance training exercises routinely during rehab and add in resistance training at home;Long Term: Improve cardiorespiratory fitness, muscular endurance and strength as measured by increased METs and functional capacity (6MWT)    Able to understand and use rate of perceived exertion (RPE) scale Yes Yes Yes Yes    Intervention Provide education and explanation on how to use RPE scale Provide education and explanation on how to use RPE scale Provide education and explanation on how to use RPE scale Provide education and explanation on how to use RPE scale    Expected Outcomes Short Term: Able to use RPE daily in rehab to express subjective intensity level;Long Term:  Able to use RPE to guide intensity level when exercising independently Short Term: Able to use RPE daily in rehab to express subjective intensity level;Long Term:  Able to use RPE to guide intensity level when exercising independently Short Term: Able to use RPE daily in rehab to express subjective intensity level;Long Term:  Able to use RPE to guide intensity level when exercising independently Short Term: Able to use RPE daily in rehab to express subjective intensity level;Long Term:  Able to use RPE to guide intensity level when exercising independently    Knowledge and understanding of Target Heart Rate Range (THRR) Yes Yes Yes Yes    Intervention Provide education  and explanation of THRR including how the numbers were predicted and where they are located for reference Provide education and explanation of THRR including how the numbers were predicted and where they are located for reference Provide education and explanation of THRR including how the numbers were predicted and where they are located for reference Provide education and explanation of THRR including how the numbers were predicted and where they are located for reference    Expected Outcomes Short Term: Able to state/look up THRR;Short Term: Able to use daily as guideline for intensity in rehab;Long Term: Able to use THRR to govern intensity when exercising independently Short Term: Able to state/look up THRR;Short Term: Able to use daily as guideline for intensity in rehab;Long Term: Able to use THRR to govern intensity when exercising independently Short Term: Able to state/look up THRR;Short Term: Able to use daily as guideline for intensity in rehab;Long Term: Able to use THRR to govern intensity when exercising independently Short Term: Able to state/look up THRR;Short Term: Able to use daily as guideline for intensity in rehab;Long Term: Able to use THRR to govern intensity when exercising independently    Able to check pulse independently Yes Yes Yes Yes    Intervention Review the importance of being able to check your own pulse for safety during independent exercise;Provide education and demonstration on how to check pulse in carotid and radial arteries. Review the importance of being able to check your own pulse for safety during independent exercise;Provide education and demonstration on how to check pulse in carotid and radial arteries. Review the importance of being able to check your own pulse for safety during independent exercise;Provide education and demonstration on how to check pulse in carotid and radial arteries. Review the importance of being able to check your own pulse for safety during  independent  exercise;Provide education and demonstration on how to check pulse in carotid and radial arteries.    Expected Outcomes Short Term: Able to explain why pulse checking is important during independent exercise;Long Term: Able to check pulse independently and accurately Short Term: Able to explain why pulse checking is important during independent exercise;Long Term: Able to check pulse independently and accurately Short Term: Able to explain why pulse checking is important during independent exercise;Long Term: Able to check pulse independently and accurately Short Term: Able to explain why pulse checking is important during independent exercise;Long Term: Able to check pulse independently and accurately    Understanding of Exercise Prescription Yes Yes Yes Yes    Intervention Provide education, explanation, and written materials on patient's individual exercise prescription Provide education, explanation, and written materials on patient's individual exercise prescription Provide education, explanation, and written materials on patient's individual exercise prescription Provide education, explanation, and written materials on patient's individual exercise prescription    Expected Outcomes Short Term: Able to explain program exercise prescription;Long Term: Able to explain home exercise prescription to exercise independently Short Term: Able to explain program exercise prescription;Long Term: Able to explain home exercise prescription to exercise independently Short Term: Able to explain program exercise prescription;Long Term: Able to explain home exercise prescription to exercise independently Short Term: Able to explain program exercise prescription;Long Term: Able to explain home exercise prescription to exercise independently             Exercise Goals Re-Evaluation:  Exercise Goals Re-Evaluation     [place] Name 11/13/20 1242 12/11/20 1250 01/08/21 1321 01/08/21 1324       Exercise Goal  Re-Evaluation   Exercise Goals Review Increase Physical Activity;Increase Strength and Stamina;Able to understand and use rate of perceived exertion (RPE) scale;Knowledge and understanding of Target Heart Rate Range (THRR);Able to check pulse independently;Understanding of Exercise Prescription Increase Physical Activity;Increase Strength and Stamina;Able to understand and use rate of perceived exertion (RPE) scale;Knowledge and understanding of Target Heart Rate Range (THRR);Able to check pulse independently;Understanding of Exercise Prescription Increase Physical Activity;Increase Strength and Stamina;Able to understand and use rate of perceived exertion (RPE) scale;Knowledge and understanding of Target Heart Rate Range (THRR);Able to check pulse independently;Understanding of Exercise Prescription --    Comments Pt attended 2 sessions of cardiac rehab. He is currently working at 4.1 METs of the Ellp. Will continue to montior and progress as able. Pt has completed 14 sessions of cardiac rehab. He is progressing himself in sessions and at home exercise. He is currently working at 2.85 METs in the stepper. Will continue to montior and progress as able. Pt has completed 25 sessions of cardiac rehab. He continues to progress and increase his workload during class. He is currently working at 3.22 METs on the stepper and TM. Will continue to monitor and progress as able. --    Expected Outcomes Through exercise at rehab and at home, the patient will meet their stated goals. Through exercise at rehab and at home, the patient will meet their stated goals. Through exercise at rehab and at home, the patient will meet their stated goals. Through exercise at rehab and at home, the patient will meet their stated goals.             Nutrition & Weight - Outcomes:  Pre Biometrics - 11/10/20 1408       Pre Biometrics   Height 5' 7" (1.702 m)    Weight 79.3 kg    Waist Circumference 41 inches  Hip Circumference  39 inches    Waist to Hip Ratio 1.05 %    BMI (Calculated) 27.37    Triceps Skinfold 8 mm    % Body Fat 25.5 %    Grip Strength 28.5 kg    Flexibility 4.5 in    Single Leg Stand 19 seconds             Post Biometrics - 02/09/21 1134        Post  Biometrics   Height 5' 7" (1.702 m)    Weight 77.9 kg    Waist Circumference 40 inches    Hip Circumference 37.5 inches    Waist to Hip Ratio 1.07 %    BMI (Calculated) 26.89    Triceps Skinfold 7 mm    % Body Fat 24.3 %    Grip Strength 26.8 kg    Flexibility 6.25 in    Single Leg Stand 26.8 seconds             Nutrition:  Nutrition Therapy & Goals - 11/15/20 0715       Personal Nutrition Goals   Comments Patient scored 20 on his diet assessment. We offer 2 educational sessions on heart healthy nutrition with handouts and assistance with RD referral if patient is interested.      Intervention Plan   Intervention Nutrition handout(s) given to patient.    Expected Outcomes Short Term Goal: Understand basic principles of dietary content, such as calories, fat, sodium, cholesterol and nutrients.             Nutrition Discharge:  Nutrition Assessments - 02/12/21 0822       MEDFICTS Scores   Pre Score 20    Post Score 6    Score Difference -14             Education Questionnaire Score:  Knowledge Questionnaire Score - 02/12/21 3762       Knowledge Questionnaire Score   Pre Score 23/24    Post Score 24/24             Goals reviewed with patient; copy given to patient. Pt graduated from cardiac rehab after 34 sessions. He was able to increase his walk distance by 20% and his METs at graduation were 3.22. He reports that he will continue his exercise at the [facility].

## 2021-02-13 ENCOUNTER — Encounter (HOSPITAL_COMMUNITY): Payer: Self-pay | Admitting: Cardiology

## 2021-02-13 ENCOUNTER — Encounter: Payer: Medicare Other | Attending: Psychology

## 2021-02-13 ENCOUNTER — Other Ambulatory Visit: Payer: Self-pay

## 2021-02-13 ENCOUNTER — Ambulatory Visit (HOSPITAL_COMMUNITY)
Admission: RE | Admit: 2021-02-13 | Discharge: 2021-02-13 | Disposition: A | Discharge status: Home / Self Care | Payer: Medicare Other | Source: Ambulatory Visit | Attending: Cardiology | Admitting: Cardiology

## 2021-02-13 DIAGNOSIS — I2510 Atherosclerotic heart disease of native coronary artery without angina pectoris: Secondary | ICD-10-CM | POA: Diagnosis not present

## 2021-02-13 DIAGNOSIS — I5022 Chronic systolic (congestive) heart failure: Secondary | ICD-10-CM | POA: Insufficient documentation

## 2021-02-13 DIAGNOSIS — R41840 Attention and concentration deficit: Secondary | ICD-10-CM | POA: Insufficient documentation

## 2021-02-13 DIAGNOSIS — R413 Other amnesia: Secondary | ICD-10-CM | POA: Insufficient documentation

## 2021-02-13 LAB — ECHOCARDIOGRAM COMPLETE
Area-P 1/2: 2.45 cm2
Calc EF: 46.1 %
S' Lateral: 3.1 cm
Single Plane A2C EF: 48.7 %
Single Plane A4C EF: 42.7 %

## 2021-02-13 NOTE — Progress Notes (Signed)
Behavioral Observation  The patient appeared well-groomed and appropriately dressed. His manners were polite and appropriate to the situation. He demonstrated a positive attitude toward testing as well as good effort.   Neuropsychology Note  Justin Carroll completed 180 minutes of neuropsychological testing with technician, Dina Rich, BA, under the supervision of Ilean Skill, PsyD., Clinical Neuropsychologist. The patient did not appear overtly distressed by the testing session, per behavioral observation or via self-report to the technician. Rest breaks were offered.   Clinical Decision Making: In considering the patient's current level of functioning, level of presumed impairment, nature of symptoms, emotional and behavioral responses during clinical interview, level of literacy, and observed level of motivation/effort, a battery of tests was selected by Dr. Sima Matas during initial consultation on 11/22/2020. This was communicated to the technician. Communication between the neuropsychologist and technician was ongoing throughout the testing session and changes were made as deemed necessary based on patient performance on testing, technician observations and additional pertinent factors such as those listed above.  Tests Administered: Controlled Oral Word Association Test (COWAT; FAS & Animals)  Wechsler Adult Intelligence Scale, 4th Edition (WAIS-IV) Wechsler Memory Scale, 4th Edition (WMS-IV); Older Adult Battery    Results:  COWAT FAS Total = 37 Z = -0.41 Animals Total = 18 Z = -0.04      WAIS  Composite Score Summary  Scale Sum of Scaled Scores Composite Score Percentile Rank 95% Conf. Interval Qualitative Description  Verbal Comprehension 42 VCI 122 93 115-127 Superior  Perceptual Reasoning 28 PRI 96 39 90-102 Average  Working Memory 24 WMI 111 77 104-117 High Average  Processing Speed 16 PSI 89 23 82-98 Low Average  Full Scale 110 FSIQ 106 66 102-110  Average  General Ability 70 GAI 110 75 105-115 High Average      Verbal Comprehension Subtests Summary  Subtest Raw Score Scaled Score Percentile Rank Reference Group Scaled Score SEM  Similarities 32 14 91 14 1.04  Vocabulary 50 14 91 15 0.67  Information 22 14 91 16 0.73  (Comprehension) 30 13 84 13 1.08       Perceptual Reasoning Subtests Summary  Subtest Raw Score Scaled Score Percentile Rank Reference Group Scaled Score SEM  Block Design 33 10 50 7 1.08  Matrix Reasoning 14 10 50 7 0.90  Visual Puzzles 9 8 25 6  0.85  (Figure Weights) 13 11 63 8 0.95  (Picture Completion) 12 11 63 9 1.16       Working Doctor, general practice Raw Score Scaled Score Percentile Rank Reference Group Scaled Score SEM  Digit Span 25 10 50 8 0.79  Arithmetic 18 14 91 13 0.99  (Letter-Number Seq.) 21 12 75 10 1.04       Processing Speed Subtests Summary  Subtest Raw Score Scaled Score Percentile Rank Reference Group Scaled Score SEM  Symbol Search 23 9 37 6 1.31  Coding 41 7 16 5  0.99  (Cancellation) 28 7 16 6  1.34        WMS- Older Adult  Index Score Summary  Index Sum of Scaled Scores Index Score Percentile Rank 95% Confidence Interval Qualitative Descriptor  Auditory Memory (AMI) 46 109 73 102-115 Average  Visual Memory (VMI) 14 84 14 80-89 Low Average  Immediate Memory (IMI) 31 102 55 96-108 Average  Delayed Memory (DMI) 29 98 45 91-106 Average      Primary Subtest Scaled Score Summary  Subtest Domain Raw Score Scaled Score Percentile Rank  Logical Memory I  AM 39 13 84  Logical Memory II AM 24 12 75  Verbal Paired Associates I AM 21 10 50  Verbal Paired Associates II AM 7 11 63  Visual Reproduction I VM 28 8 25   Visual Reproduction II VM 8 6 9   Symbol Span VWM 17 9 37      Auditory Memory Process Score Summary  Process Score Raw Score Scaled Score Percentile Rank Cumulative Percentage (Base Rate)  LM II Recognition 20 - - 51-75%  VPA II  Recognition 29 - - 51-75%       Visual Memory Process Score Summary  Process Score Raw Score Scaled Score Percentile Rank Cumulative Percentage (Base Rate)  VR II Recognition 6 - - >75%      ABILITY-MEMORY ANALYSIS  Ability Score:  VCI: 122 Date of Testing:  WAIS-IV; WMS-IV 2021/02/13  Predicted Difference Method   Index Predicted WMS-IV Index Score Actual WMS-IV Index Score Difference Critical Value  Significant Difference Y/N Base Rate  Auditory Memory 111 109 2 9.51 N   Visual Memory 110 84 26 8.07 Y 3%  Immediate Memory 113 102 11 10.30 Y 20%  Delayed Memory 111 98 13 11.51 Y 15-20%  Statistical significance (critical value) at the .01 level.         Feedback to Patient: Justin Carroll will return on 05/29/2021 for an interactive feedback session with Dr. Sima Matas at which time his test performances, clinical impressions and treatment recommendations will be reviewed in detail. The patient understands he can contact our office should he require our assistance before this time.  180 minutes spent face-to-face with patient administering standardized tests, 30 minutes spent scoring Environmental education officer). [CPT [id number], [id number]]  Full report to follow.

## 2021-02-15 ENCOUNTER — Telehealth (HOSPITAL_COMMUNITY): Payer: Self-pay | Admitting: Pharmacist

## 2021-02-15 NOTE — Telephone Encounter (Signed)
Advanced Heart Failure Patient Advocate Encounter   Patient was approved to receive Entresto from [employer].   Patient ID: [id number] Effective dates: 02/04/21 through 02/03/22  Audry Riles, PharmD, BCPS, BCCP, CPP Heart Failure Clinic Pharmacist [phone]

## 2021-02-16 NOTE — Telephone Encounter (Signed)
Correction ok to order MRI.

## 2021-02-19 ENCOUNTER — Encounter (HOSPITAL_COMMUNITY): Payer: Self-pay | Admitting: Cardiology

## 2021-02-21 ENCOUNTER — Other Ambulatory Visit: Payer: Self-pay

## 2021-02-21 ENCOUNTER — Encounter (HOSPITAL_COMMUNITY): Payer: Self-pay | Admitting: Cardiology

## 2021-02-21 ENCOUNTER — Ambulatory Visit (HOSPITAL_COMMUNITY)
Admission: RE | Admit: 2021-02-21 | Discharge: 2021-02-21 | Disposition: A | Discharge status: Home / Self Care | Payer: Medicare Other | Source: Ambulatory Visit | Attending: Cardiology | Admitting: Cardiology

## 2021-02-21 ENCOUNTER — Other Ambulatory Visit (HOSPITAL_COMMUNITY): Payer: Medicare Other

## 2021-02-21 VITALS — BP 98/60 | HR 59 | Wt 168.0 lb

## 2021-02-21 DIAGNOSIS — I130 Hypertensive heart and chronic kidney disease with heart failure and stage 1 through stage 4 chronic kidney disease, or unspecified chronic kidney disease: Secondary | ICD-10-CM | POA: Insufficient documentation

## 2021-02-21 DIAGNOSIS — I252 Old myocardial infarction: Secondary | ICD-10-CM | POA: Insufficient documentation

## 2021-02-21 DIAGNOSIS — I2510 Atherosclerotic heart disease of native coronary artery without angina pectoris: Secondary | ICD-10-CM | POA: Insufficient documentation

## 2021-02-21 DIAGNOSIS — N183 Chronic kidney disease, stage 3 unspecified: Secondary | ICD-10-CM | POA: Diagnosis not present

## 2021-02-21 DIAGNOSIS — F909 Attention-deficit hyperactivity disorder, unspecified type: Secondary | ICD-10-CM | POA: Diagnosis not present

## 2021-02-21 DIAGNOSIS — F418 Other specified anxiety disorders: Secondary | ICD-10-CM | POA: Insufficient documentation

## 2021-02-21 DIAGNOSIS — Z7982 Long term (current) use of aspirin: Secondary | ICD-10-CM | POA: Diagnosis not present

## 2021-02-21 DIAGNOSIS — G4733 Obstructive sleep apnea (adult) (pediatric): Secondary | ICD-10-CM | POA: Insufficient documentation

## 2021-02-21 DIAGNOSIS — E785 Hyperlipidemia, unspecified: Secondary | ICD-10-CM

## 2021-02-21 DIAGNOSIS — I5022 Chronic systolic (congestive) heart failure: Secondary | ICD-10-CM

## 2021-02-21 DIAGNOSIS — F32A Depression, unspecified: Secondary | ICD-10-CM | POA: Diagnosis not present

## 2021-02-21 DIAGNOSIS — Z79899 Other long term (current) drug therapy: Secondary | ICD-10-CM | POA: Diagnosis not present

## 2021-02-21 LAB — LIPID PANEL
Cholesterol: 120 mg/dL (ref 0–200)
HDL: 58 mg/dL (ref >40)
LDL Cholesterol: 45 mg/dL (ref 0–99)
Total CHOL/HDL Ratio: 2.1 RATIO
Triglycerides: 85 mg/dL (ref <150)
VLDL: 17 mg/dL (ref 0–40)

## 2021-02-21 LAB — BASIC METABOLIC PANEL
Anion gap: 15 (ref 5–15)
BUN: 20 mg/dL (ref 8–23)
CO2: 24 mmol/L (ref 22–32)
Calcium: 9.7 mg/dL (ref 8.9–10.3)
Chloride: 99 mmol/L (ref 98–111)
Creatinine, Ser: 1.3 mg/dL — ABNORMAL HIGH (ref 0.61–1.24)
GFR, Estimated: >60 mL/min (ref >60)
Glucose, Bld: 101 mg/dL — ABNORMAL HIGH (ref 70–99)
Potassium: 4.9 mmol/L (ref 3.5–5.1)
Sodium: 138 mmol/L (ref 135–145)

## 2021-02-21 LAB — CBC
HCT: 54.7 % — ABNORMAL HIGH (ref 39.0–52.0)
Hemoglobin: 18.2 g/dL — ABNORMAL HIGH (ref 13.0–17.0)
MCH: 31.1 pg (ref 26.0–34.0)
MCHC: 33.3 g/dL (ref 30.0–36.0)
MCV: 93.3 fL (ref 80.0–100.0)
Platelets: 166 10*3/uL (ref 150–400)
RBC: 5.86 MIL/uL — ABNORMAL HIGH (ref 4.22–5.81)
RDW: 15.9 % — ABNORMAL HIGH (ref 11.5–15.5)
WBC: 7.6 10*3/uL (ref 4.0–10.5)
nRBC: 0 % (ref 0.0–0.2)

## 2021-02-21 NOTE — Patient Instructions (Signed)
Labs done today, your results will be available in MyChart, we will contact you for abnormal readings.  Your physician has requested that you have a cardiac MRI. Cardiac MRI uses a computer to create images of your heart as its beating, producing both still and moving pictures of your heart and major blood vessels. For further information please visit [email or web address] Please follow the instruction sheet given to you today for more information. ONCE APPROVED BY INSURANCE YOU WILL BE CONTACTED TO SCHEDULE  Your physician recommends that you schedule a follow-up appointment in: 2 months  Do the following things EVERYDAY: Weigh yourself in the morning before breakfast. Write it down and keep it in a log. Take your medicines as prescribed Eat low salt foods--Limit salt (sodium) to 2000 mg per day.  Stay as active as you can everyday Limit all fluids for the day to less than 2 liters  If you have any questions or concerns before your next appointment please send [place] a message through [place] or call our office at [phone].    TO LEAVE A MESSAGE FOR THE NURSE SELECT OPTION 2, PLEASE LEAVE A MESSAGE INCLUDING: YOUR NAME DATE OF BIRTH CALL BACK NUMBER REASON FOR CALL**this is important as we prioritize the call backs  YOU WILL RECEIVE A CALL BACK THE SAME DAY AS LONG AS YOU CALL BEFORE 4:00 PM  At the [facility], you and your health needs are our priority. As part of our continuing mission to provide you with exceptional heart care, we have created designated Provider Care Teams. These Care Teams include your primary Cardiologist (physician) and Advanced Practice Providers (APPs- Physician Assistants and Nurse Practitioners) who all work together to provide you with the care you need, when you need it.   You may see any of the following providers on your designated Care Team at your next follow up: Dr Glori Bickers Dr Haynes Kerns, NP Lyda Jester,  [place] [facility] [place], [place] Audry Riles, PharmD   Please be sure to bring in all your medications bottles to every appointment.

## 2021-02-21 NOTE — Progress Notes (Signed)
Medication Samples have been provided to the patient.  Drug name: [name]       Strength: 24/26 mg        Qty: 1  LOT: [id number]   Exp.Date: 03/2023  Dosing instructions: Take 1 tablet Twice daily   The patient has been instructed regarding the correct time, dose, and frequency of taking this medication, including desired effects and most common side effects.   Juanita Laster Amare Bail 1:11 PM 02/21/2021

## 2021-02-22 NOTE — Progress Notes (Signed)
Primary Care: Former patient of Dr Zonia Kief [place] Primary Cardiologist: Dr Gwenlyn Found  Neurology: Dr Dohmeier  HF Cardiology: Dr Aundra Dubin   Mr Delap is a 66 y.o. with a history of ADHD, depression, mild cognitive impairment vs adult ADHD, former heavy drinker (has not had alcohol in > 30 years), CAD s/p NSTEMI, and ischemic cardiomyopathy who was referred to CHF clinic from [facility] clinic.   Presented to [facility] 10/18/20 with chest pain. HS trop elevated. Had NSTEMI and underwent cath that showed severe one vessel disease mid left circumflex treated with DES.  Plan dual antiplatelet therapy x 12 months. [name] was added but had AKI so stopped prior to discharge. Prior to admit he was taking 20 mg Adderall everyday with an extra 20 mg 4-5 times a week. Adderall was stopped during hospitalization.  Echo showed EF 35-40%, normal RV.   Echo was repeated in 1/23, technically difficult study with EF 40%, regional WMAs, RV mildly decreased function.   He presents for followup of CHF and CAD.  He seems to be doing better, less fatigued. Rare atypical chest pain. No significant exertional dyspnea.  No orthopnea/PND.  He is using CPAP.  He has been doing cardiac rehab, has found that this has helped.  SBP generally in 90s at rehab. No lightheadedness.   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 He has 7 children, several were adopted. Prior smoker, quit 2015.  Prior heavy ETOH, quit in 1980s.    FH: Father and uncles with MIs in 39s.   PMH: 1. OSA: Waiting for CPAP.  2. Adult ADHD 3. Depression 4. Allergic rhinitis 5. Hyperlipidemia 6. HTN 7. CKD stage 3 8. CAD: NSTEMI 9/22 with 85% pLCs stenosis on cath, treated with DES.  9. Chronic systolic CHF: Ischemic (versus mixed ischemic/nonischemic) cardiomyopathy.   - Echo (9/22): EF 35-40%, normal RV.  -  Echo (1/23): technically difficult study with EF 40%, regional WMAs, RV mildly decreased function.  Review of Systems: All systems reviewed and negative except as per HPI.   Current Outpatient Medications  Medication Sig Dispense Refill   aspirin 81 MG EC tablet Take 1 tablet (81 mg total) by mouth daily. Swallow whole. 90 tablet 3   atorvastatin (LIPITOR) 80 MG tablet Take 1 tablet (80 mg total) by mouth daily. 90 tablet 3   Azelastine HCl 137 MCG/SPRAY SOLN USE 2 SPRAY(S) IN EACH NOSTRIL TWICE DAILY AS NEEDED FOR RHINITIS 30 mL 2   buPROPion (WELLBUTRIN XL) 300 MG 24 hr tablet Take 1 tablet by mouth once daily 90 tablet 0   carvedilol (COREG) 12.5 MG tablet Take 1 tablet (12.5 mg total) by mouth 2 (two) times daily with a meal. 60 tablet 11   Cholecalciferol (VITAMIN D-3 PO) Take 2,000 Units by mouth daily.     dapagliflozin propanediol (FARXIGA) 10 MG TABS tablet Take 1 tablet (10 mg total) by mouth daily. 30 tablet 11   Evolocumab (REPATHA SURECLICK) 588 MG/ML SOAJ Inject 140 mg into the skin every 14 (fourteen) days. 2 mL 12   levocetirizine (XYZAL) 5 MG tablet Take 5 mg by mouth every evening.     LORazepam (ATIVAN) 0.5 MG tablet Take 1 tablet (0.5 mg total) by mouth daily as needed for anxiety. 7 tablet 0   mometasone (NASONEX) 50 MCG/ACT nasal spray Place 1 spray  into the nose daily. 17 g 5   nitroGLYCERIN (NITROSTAT) 0.4 MG SL tablet DISSOLVE ONE TABLET UNDER THE TONGUE EVERY 5 MINUTES AS NEEDED FOR CHEST PAIN.  DO NOT EXCEED A TOTAL OF 3 DOSES IN 15 MINUTES 25 tablet 3   pantoprazole (PROTONIX) 40 MG tablet Take 1 tablet by mouth once daily 90 tablet 0   sacubitril-valsartan (ENTRESTO) 24-26 MG Take 1 tablet by mouth 2 (two) times daily. 180 tablet 3   sertraline (ZOLOFT) 25 MG tablet Take 1 tablet (25 mg total) by mouth at bedtime. 30 tablet 11   spironolactone (ALDACTONE) 25 MG tablet Take 1 tablet (25 mg total) by mouth daily. 30 tablet 11   tamsulosin (FLOMAX) 0.4 MG CAPS capsule  TAKE 1 CAPSULE BY MOUTH ONCE DAILY AFTER SUPPER 90 capsule 0   testosterone cypionate (DEPOTESTOSTERONE CYPIONATE) 200 MG/ML injection INJECT 0.5 MLS INTO THE MUSCLE TWICE A WEEK. 10 mL 2   ticagrelor (BRILINTA) 90 MG TABS tablet Take 1 tablet (90 mg total) by mouth 2 (two) times daily. 60 tablet 11   traZODone (DESYREL) 100 MG tablet TAKE 1 TABLET BY MOUTH AT BEDTIME 90 tablet 0   No current facility-administered medications for this encounter.    No Known Allergies   Vitals:   02/21/21 1224  BP: 98/60  Pulse: (!) 59  SpO2: 94%  Weight: 76.2 kg (168 lb)   Wt Readings from Last 3 Encounters:  02/21/21 76.2 kg (168 lb)  02/09/21 77.9 kg (171 lb 11.8 oz)  01/24/21 77.7 kg (171 lb 3.2 oz)    PHYSICAL EXAM: General: NAD Neck: No JVD, no thyromegaly or thyroid nodule.  Lungs: Clear to auscultation bilaterally with normal respiratory effort. CV: Nondisplaced PMI.  Heart regular S1/S2, no S3/S4, no murmur.  No peripheral edema.  No carotid bruit.  Normal pedal pulses.  Abdomen: Soft, nontender, no hepatosplenomegaly, no distention.  Skin: Intact without lesions or rashes.  Neurologic: Alert and oriented x 3.  Psych: Normal affect. Extremities: No clubbing or cyanosis.  HEENT: Normal.   ASSESSMENT & PLAN: 1. 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  Echo in 9/22 with EF 35-40%. LHC showed single vessel CAD involving mid left circumflex. Cardiomyopathy seems out of proportion to CAD.  Echo in 1/23 was a difficult study, EF estimated to be around 40%. NYHA class II symptoms, not volume overloaded on exam. - Continue Coreg mg bid, no BP room to increase.    - Continue farxiga 10 mg daily.  - Continue Entresto 24/26 bid.  No BP room to increase.  - Continue spironolactone 25 mg daily, BMET today.  - I will arrange for cardiac MRI for better quantification of EF and to look for any evidence of infiltrative disease or myocarditis.  2. CAD: NSTEMI 9/22,  DES to pLCx.  No chest pain.  - Continue ASA 81 daily and ticagrelor 90 bid.  - Continue atorvastatin, check lipids today (goal LDL < 55).   3. CKD: Stage 3.  BMET today.  4. ADHD: Off Adderall for now.  5. Depression/anxiety: Continue sertraline 25 mg daily.  6. OSA: Using CPAP.    Followup in 2 months   Justin Carroll 02/22/2021

## 2021-03-16 ENCOUNTER — Telehealth (HOSPITAL_COMMUNITY): Payer: Self-pay | Admitting: *Deleted

## 2021-03-16 NOTE — Telephone Encounter (Signed)
Reaching out to patient to offer assistance regarding upcoming cardiac imaging study; pt verbalizes understanding of appt date/time, parking situation and where to check in, and verified current allergies; name and call back number provided for further questions should they arise  Justin Clement RN [facility] [phone] office [phone] cell  Patient denies claustrophobia or metal. He is aware he can take his ativan if needed but will need a driver if he decides to take ativan for the MRI.

## 2021-03-20 ENCOUNTER — Ambulatory Visit (HOSPITAL_COMMUNITY)
Admission: RE | Admit: 2021-03-20 | Discharge: 2021-03-20 | Disposition: A | Discharge status: Home / Self Care | Payer: Medicare Other | Source: Ambulatory Visit | Attending: Cardiology | Admitting: Cardiology

## 2021-03-20 ENCOUNTER — Other Ambulatory Visit: Payer: Self-pay

## 2021-03-20 DIAGNOSIS — I5022 Chronic systolic (congestive) heart failure: Secondary | ICD-10-CM

## 2021-03-20 MED ORDER — GADOBUTROL 1 MMOL/ML IV SOLN
8.0000 mL | Freq: Once | INTRAVENOUS | Status: AC | PRN
  Administered 2021-03-20: 8 mL via INTRAVENOUS

## 2021-03-21 ENCOUNTER — Ambulatory Visit: Payer: Medicare Other | Admitting: Neurology

## 2021-04-10 ENCOUNTER — Other Ambulatory Visit: Payer: Self-pay

## 2021-04-10 ENCOUNTER — Encounter: Payer: Medicare Other | Attending: Psychology | Admitting: Psychology

## 2021-04-10 ENCOUNTER — Encounter: Payer: Self-pay | Admitting: Psychology

## 2021-04-10 DIAGNOSIS — R41840 Attention and concentration deficit: Secondary | ICD-10-CM | POA: Diagnosis not present

## 2021-04-10 DIAGNOSIS — I679 Cerebrovascular disease, unspecified: Secondary | ICD-10-CM | POA: Diagnosis not present

## 2021-04-10 DIAGNOSIS — R413 Other amnesia: Secondary | ICD-10-CM | POA: Insufficient documentation

## 2021-04-10 NOTE — Progress Notes (Signed)
Neuropsychological Evaluation   Patient:  Justin Carroll   DOB: 26-Jan-1956  MR Number: [id number]  Location: [facility] PHYSICAL MEDICINE AND REHABILITATION [place], [address] [id number] [place] [address] Dept: [phone]  Start: 10 AM End: 11 AM  Provider/Observer:     Edgardo Roys PsyD  Chief Complaint:      Chief Complaint  Patient presents with   Memory Loss   Other    Issues with attention and concentration    Reason For Service:     Justin Carroll is a 66 year old male referred by Larey Seat, MD for neuropsychological evaluation as part of a larger neurological work-up.  He has also been actively followed by Debbora Presto, NP who is also part of his care team with [facility].  The patient was referred for neurological assessment by his PCP because of reports of issues with attention and concentration and memory changes as well as change in mood.  The patient is described as becoming more irritable and agitated.  Patient has a past medical history that includes hyperlipidemia and diagnostic rule outs including dementia and progressive supranuclear ophthalmoplegia, frontotemporal dementia, cognitive attentional deficits.  Patient also has a past history of depression.  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  The patient is described as having very good days and very bad days and is described as usually being better in the morning when it comes to cognitive performance.  The patient reports that there are times when he is at loss for words and sometimes skips words and has difficulty with fluency at times.  The patient had a formal sleep study conducted on 9/30 with a diagnosis of mild obstructive  sleep apnea.  The patient has had a CPAP ordered and he is waiting for it to come in to start using.  The patient also had a recent heart attack as well.  Patient describes difficulty coping with everything that has been happening around his heart attack and then a) and his physician dying suddenly while on a trip to [place].  It has been a very stressful time for him with everything that occurred.  The patient is described as having a couple of falls recently and there has been an attempt at an MRI that was significantly motion degraded.  There was no particular indications of acute process during this prior neuro imaging efforts other than microvascular ischemic changes/small vessel disease and changes that are normal age-related changes.  While being followed by his neurologist, the patient had MoCA assessments in January 2021 as well as March 2021.  In the January assessment he had a score of 26 and in the March assessment he had a score of 28 with significant improvements in delayed recall.  The patient has had issues with attention and concentration and recently has been tried on a number of different strategies including Adderall which was stopped due to his heart attack.  They have thought about trying Strattera but this was not done.  There were some concerns about the patient having ADD but there were also complicating variables including a very stressful and dysfunctional family life growing up and the patient had a very tough childhood and was depressed as a high Retail buyer.  Initially Dr. Brett Fairy thought there  might be issue with attention deficit disorder.  The patient's wife reports that attention and concentration and focus have always been an issue and was not new and the patient was always rather scattered in his thinking.  The patient has been taking Wellbutrin for decades and just recently started taking Zoloft.  The patient has not been working recently following his heart attack and is  still within rehab for his heart attack.  The patient reports that all of this recent assessment started after a fall 2 years ago that they thought may have been due to a stroke.  After work-up he was initially diagnosed with ADD but the patient is continued to have issues.  The patient and his wife report that they have been raising a 63-year-old after the child's father died from an overdose and the mother was still abusing.  This is been a significant stressor for the patient and his wife as well.  The patient had always been very diligent about his health and so the recent health events including the patient's heart attack and had a big impact.  The patient's father died of a "massive heart attack" and his younger brother died of a stroke.  The patient has a history of alcohol abuse and dependency but has gone quite some time without having any alcohol.  He is an active participant in alcoholics anonymous.  Tests Administered: Controlled Oral Word Association Test (COWAT; FAS & Animals)  Wechsler Adult Intelligence Scale, 4th Edition (WAIS-IV) Wechsler Memory Scale, 4th Edition (WMS-IV); Older Adult Battery   Participation Level:   Active  Participation Quality:  Appropriate      Behavioral Observation:  The patient appeared well-groomed and appropriately dressed. His manners were polite and appropriate to the situation. He demonstrated a positive attitude toward testing as well as good effort.   Well Groomed, Alert, and Appropriate.   Test Results:   Initially, an estimation was made as to the patient's historic/premorbid intellectual and cognitive functioning.  The patient graduated from [employer] college and attended [employer] where he finished in the top of his class.  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  Given these variables it is estimated that the patient's premorbid intellectual and cognitive abilities would have likely fallen around the superior range and we will  utilize a conservative estimate of premorbid functioning to be around 1 25-1 30 relative to a normative population comparisons.  The patient does describe some issues throughout his life with attention and concentration and we will consider some adaptation and adjustment on various attentional measures when assessing potential differences between premorbid functioning and current achieved scores.    COWAT FAS Total = 37 Z = -0.41 Animals Total = 18 Z = -0.04   Initially, as the patient has described changes in expressive language/verbal fluency and word finding abilities, the patient was administered the controlled oral Word Association test.  His performance was compared against age, gender and education matched comparison group for analysis.  On measures of lexical fluency (FAS test) the patient produced a total word production of 37 words.  This is only slightly below normative expectations and does not suggest any significant deficits relative to normative comparison group for lexical fluency.  The patient was also administered somatic fluency       WAIS             Composite Score Summary      Scale Sum of Scaled Scores Composite Score Percentile Rank 95% Conf.  Interval Qualitative Description  Verbal Comprehension 42 VCI 122 93 115-127 Superior  Perceptual Reasoning 28 PRI 96 39 90-102 Average  Working Memory 24 WMI 111 77 104-117 High Average  Processing Speed 16 PSI 89 23 82-98 Low Average  Full Scale 110 FSIQ 106 66 102-110 Average  General Ability 70 GAI 110 75 105-115 High Average    The patient was then administered the Wechsler adult intelligence scale-for to provide a thorough objective assessment of a wide range of various cognitive domains.  Considering that the patient is describing some significant changes in memory and attention these measures should not be used as a description of his lifelong functioning levels but as a objective assessment of his current cognitive  functioning.  The patient produced a full-scale IQ score of 106 which falls at the 66 percentile and is in the average range.  This is a composite score of all cognitive domains.  This level of function is significantly below predicted levels of historic/premorbid functioning levels and suggest 1 or more cognitive domains to be below premorbid estimates.  We also calculated the patient's general abilities Baldex score which places less emphasis on measures sensitive to acute changes particularly attention measures including encoding and focus execute abilities.  The patient produced a general abilities index score of 110 which falls at the 75th percentile and is in the high average range.             Verbal Comprehension Subtests Summary     Subtest Raw Score Scaled Score Percentile Rank Reference Group Scaled Score SEM  Similarities 32 14 91 14 1.04  Vocabulary 50 14 91 15 0.67  Information 22 14 91 16 0.73  (Comprehension) 30 13 84 13 1.08      The patient produced a verbal comprehension index score of 122 which falls at the 93rd percentile and is in the superior range.  This is generally consistent with premorbid estimates suggesting excellent verbal based cognitive skills.  The patient did very well on measures related to verbal reasoning and problem-solving, vocabulary knowledge, general fund of information and social judgment comprehension skills.             Perceptual Reasoning Subtests Summary      Subtest Raw Score Scaled Score Percentile Rank Reference Group Scaled Score SEM  Block Design 33 10 50 7 1.08  Matrix Reasoning 14 10 50 7 0.90  Visual Puzzles '9 8 25 6 '$ 0.85  (Figure Weights) 13 11 63 8 0.95  (Picture Completion) 12 11 63 9 1.16      The patient produced a perceptual reasoning index score of 96 which falls in the 39th percentile and is in the average range.  This is significantly below premorbid estimates and while his performance was in the average range and is below  predicted levels based on his educational and occupational history.  The patient produced average scores with regard to visual analysis and organization, visual reasoning and problem-solving, visual estimation and visual prediction abilities.  The patient also performed in the average range with regard to his ability to identify visual anomalies within a visual gestalt.  While all of these measures are in the average range they are below predicted levels of premorbid functioning and while they do not suggest any significant or profound change in visual-spatial and visual constructional areas they do suggest some potential changes.             Working Wellsite geologist  Subtest Raw Score Scaled Score Percentile Rank Reference Group Scaled Score SEM  Digit Span 25 10 50 8 0.79  Arithmetic 18 14 91 13 0.99  (Letter-Number Seq.) 21 12 75 10 1.04      The patient produced a working memory index score of 111 which falls at the 77th percentile and is in the high average range.  Given the descriptions of the patient having some difficulties in attention and concentration and the fact that one of the most demanding components of this measure was his highest subtest performance there does not appear to be any overly significant deficits with regard to his specific attentional variable of auditory encoding and his ability to process information and active auditory register.  This level of performance would allow for effective learning and memory to take place and not be overly impacted by encoding deficits.             Processing Speed Subtests Summary      Subtest Raw Score Scaled Score Percentile Rank Reference Group Scaled Score SEM  Symbol Search 23 9 37 6 1.31  Coding 41 '7 16 5 '$ 0.99  (Cancellation) '28 7 16 6 '$ 1.34      The patient produced a processing speed index score of 89 which falls at the 23rd percentile and is in the low average range.  This is his most impaired level of performance  and 2 of the 3 elements fell in the mildly impaired range relative to a normative population and all 3 fell significantly below predictions of premorbid levels of functioning.  The patient showed mild to moderate deficits with regard to visual scanning, visual searching and overall speed of mental operations.       WMS- Older Adult           Index Score Summary       Index Sum of Scaled Scores Index Score Percentile Rank 95% Confidence Interval Qualitative Descriptor  Auditory Memory (AMI) 46 109 73 102-115 Average  Visual Memory (VMI) 14 84 14 80-89 Low Average  Immediate Memory (IMI) 31 102 55 96-108 Average  Delayed Memory (DMI) 29 98 45 91-106 Average    The patient was then administered the Wechsler Memory Scale's-IV for older adults to provide an objective thorough assessment of various memory and learning domains.  On the Wechsler Adult Intelligence Scale the patient showed fairly good auditory encoding functions particularly on the most demanding tasks requiring active manipulation of information and active auditory registers.  While not quite as good the patient showed adequate performance on measures of visual encoding and well with all measures in the average range and well below where his predicted levels of functioning would have been they are not at a level that would keep him from effectively learning new visual or auditory information based solely on encoding deficits.  Breaking memory functions down between auditory versus visual memory the patient produced an auditory memory index score of 109 which falls at the 73rd percentile and is in the average range relative to a normative population.  The patient had significantly greater impairments with regard to visual memory.  The patient produced a visual memory index score of 84 which fell at the 14th percentile and is in the low average range.  This is a significant difference between auditory versus visual memory components.   Looking at cues/recognition components of these auditory versus visual memory the patient showed significant improvement for both auditory and visual information under recognition formats.  This  was particularly striking for recognition of visual information previously learned.  This pattern would suggest that the primary visual deficit has to do with mild weaknesses with regard to visual encoding versus auditory encoding and significant weaknesses with regard to retrieval of information rather than an inability to effectively store and organize that information.  While the patient does clearly show greater visual deficits his significant improvement under recognition formats would suggest attentional and retrieval of information is playing a significant role in the observed visual memory deficits.  Breaking memory and learning down between immediate versus delayed memory the patient produced an immediate memory index score of 102 which falls at the 55th percentile and is in the average range.  This is significantly below premorbid estimates but does fall in the average range relative to a normative population.  The patient showed little loss of information over a period of delay and produced a delayed memory index score of 98 which fell at the 45th percentile and also in the average range.  This pattern suggest that information that is effectively stored and organized is not lost over a period of delay.  This pattern is not consistent with patterns of memory strengths and weaknesses found with conditions such as Alzheimer's or frontotemporal dementia.            Primary Subtest Scaled Score Summary     Subtest Domain Raw Score Scaled Score Percentile Rank  Logical Memory I AM 39 13 84  Logical Memory II AM 24 12 75  Verbal Paired Associates I AM 21 10 50  Verbal Paired Associates II AM 7 11 63  Visual Reproduction I VM '28 8 25  '$ Visual Reproduction II VM '8 6 9  '$ Symbol Span VWM 17 9 37                 Auditory Memory Process Score Summary      Process Score Raw Score Scaled Score Percentile Rank Cumulative Percentage (Base Rate)  LM II Recognition 20 - - 51-75%  VPA II Recognition 29 - - 51-75%                   Visual Memory Process Score Summary      Process Score Raw Score Scaled Score Percentile Rank Cumulative Percentage (Base Rate)  VR II Recognition 6 - - >75%          ABILITY-MEMORY ANALYSIS   Ability Score:    VCI: 122 Date of Testing:           WAIS-IV; WMS-IV 2021/02/13             Predicted Difference Method    Index Predicted WMS-IV Index Score Actual WMS-IV Index Score Difference Critical Value   Significant Difference Y/N Base Rate  Auditory Memory 111 109 2 9.51 N    Visual Memory 110 84 26 8.07 Y 3%  Immediate Memory 113 102 11 10.30 Y 20%  Delayed Memory 111 98 13 11.51 Y 15-20%  Statistical significance (critical value) at the .01 level.      We also calculated the patient's ability-memory analysis utilizing the patient's verbal comprehension index score of 122 to create an estimate her predicted level of functioning on various memory domains.  This predicted level is then used as comparison point versus actual achieved levels of performance.  The patient's auditory memory functions was generally consistent with predicted levels of performance based on his well retained verbal comprehension index scores.  However, there was significant  differences between predicted levels of performance versus achieved levels of performance for visual memory as well as immediate and delayed memory.  However, the visual memory was much more striking.  Clearly, the patient is having memory difficulties and memory loss but deep analysis looking at individual subtest of these memory scores would suggest that the greatest issue has to do with weakened encoding capacity and difficulties with retrieval of information rather than it being primary deficits for storage and  organization.  In fact, the patient was able to retain the vast majority of information that he initially learned over a period of delay.   Summary of Results:   Overall, the results of the current neuropsychological evaluation do suggest that the greatest observed cognitive deficits relative to premorbid estimates were in areas primarily affected by attention and concentration, focus execute abilities and information processing speed.  The patient displayed relatively well-preserved verbal based skills including verbal reasoning and problem-solving, general fund of information, vocabulary knowledge and social judgment and comprehension.  The patient also appears to do relatively well on verbal based language skills even though he describes word finding difficulties and word substitution type symptoms.  The patient performed generally consistent with age-matched and education matched comparison groups for both lexical fluency as well as semantic fluency.  The patient did show some significant loss of global cognitive performance with specific primary weaknesses or potential changes in the areas of visual spatial and visual reasoning capacity, visual scanning and overall speed of mental operations as the primary component of cognitive changes.  The patient did better on auditory encoding versus visual encoding and there was a significant difference between auditory memory and learning versus visual memory and learning with visual memory and learning showing the greatest deficits.  In-depth analysis suggest that the patient displayed significant improvements on his visual memory components under recognition/cued recall.  It appears that the primary visual deficits had to do with impaired encoding and deficits for retrieval.  The information that he had initially learned was well preserved over a delayed for both auditory and visual learning with significant improvements under recognition formats.  Overall, while  the patient does show a decrease in global cognitive functioning the primary etiological factors do not appear to be focal deficits in frontal or temporal regions but greater issues with regard to attention and concentration and information processing speed but he does show patterns of greater impairments for right hemisphere type tasks versus left hemisphere type tasks.  Impression/Diagnosis:   Overall, the results of the current neuropsychological evaluation do show consistencies between the patient's subjective reports and objective cognitive assessment.  The patient describes memory deficits, worsening attentional issues in the setting of his reported difficulties with attention and concentration, which are having the primary impact on his overall cognitive functioning.  The patient is well maintained verbal based skills including verbal reasoning and problem-solving with generally well-preserved visual reasoning and problem solving although performance is below predicted levels but still in the average range relative to a normative population.  His primary deficits had to do with significant reduction in information processing speed and visual scanning and visual searching abilities as well as issues with visual encoding capacity.  Significant improvements in memory under recognition formats and stability of information over a period of delay and learning task were also noted.  As far as diagnostic considerations, this assessment is not consistent with patterns typically associated with Alzheimer's type progressive dementia and he also shows some significant differences between those typically  seen with frontotemporal dementia.  The patient is having cognitive changes that are primarily in the attentional domain including focus execute deficits and information processing speed deficits as well as retrieval deficits for memory.  The information that is initially learned is retained over a period of delay  without significant loss and there is significant improvements under recognition formats not typical of Alzheimer's or frontotemporal dementia.  The patient is experiencing some verbal fluency changes but objective assessment of semantic and lexical fluency did not show any significant deficits but potentially a loss from excellent premorbid functioning.  The patient has had a heart attack previously and ongoing cardiovascular health issues including mild to moderate reduced cognitive functioning, which would suggest cardiovascular and likely cerebrovascular changes.  He is described a lifelong issue with attention and concentration and significant worsening of attention and concentration recently.  While the MRI had significant limitations in interpretation due to motion degradation there were indications of some microvascular ischemic and small vessel changes.  The patient also had recent PET scan conducted in January 2021 due to concerns of possible frontotemporal dementia versus Alzheimer's type dementia.  The results showed no decreased cortical metabolism within the parietal lobes to suggest Alzheimer's pathology and no decreased frontal lobe cortical activity to suggest frontotemporal dementia.  While this PET scan is not diagnostic in and of itself it is consistent with results of the current neuropsychological evaluation.  I suspect that the primary culprit for his observed cognitive changes have to do with cerebrovascular changes and small vessel disease in conjunction with pre-existing attentional deficits.  However, given the fact that we may be early in the assessment as the patient had likely performed in the superior range and there is a global decrease in cognitive functioning from these premorbid estimates it will be important to have repeat testing in approximately 9 months or so to add in the strength of this cognitive assessment.  We cannot completely rule out the possibility of a frontotemporal  dementia given his excellent premorbid level of cognitive functioning but at this point the primary deficits appear to be attentional component including information processing speed and focus execute abilities and significant impairment with visual encoding and retrieval of information rather than an inability to effectively store and organize and retain that information over a period of delay.  There is considerable indication of lateralization with greater deficits for right hemisphere type functions versus left hemisphere type functions.  I will sit down with the patient and go over the results of the recent neuropsychological evaluation and the plan and need for repeat testing in approximately 9 to 12 months.  Diagnosis:    Memory loss, short term  Attention and concentration deficit  Cognitive attention deficit  Small vessel disease, cerebrovascular   _____________________ Ilean Skill, Psy.D. Clinical Neuropsychologist

## 2021-04-11 ENCOUNTER — Other Ambulatory Visit: Payer: Self-pay

## 2021-04-11 ENCOUNTER — Encounter: Payer: Self-pay | Admitting: Cardiology

## 2021-04-11 ENCOUNTER — Ambulatory Visit (INDEPENDENT_AMBULATORY_CARE_PROVIDER_SITE_OTHER): Payer: Medicare Other | Admitting: Cardiology

## 2021-04-11 VITALS — BP 110/68 | HR 57 | Ht 67.0 in | Wt 174.6 lb

## 2021-04-11 DIAGNOSIS — I10 Essential (primary) hypertension: Secondary | ICD-10-CM

## 2021-04-11 DIAGNOSIS — G4733 Obstructive sleep apnea (adult) (pediatric): Secondary | ICD-10-CM

## 2021-04-11 NOTE — Patient Instructions (Signed)

## 2021-04-11 NOTE — Progress Notes (Signed)
Cardiology Office Note:    Date:  04/11/2021   ID:  Justin Carroll, DOB August 17, 1955, MRN [id number]  PCP:  [facility]  Cardiologist:  Quay Burow, MD    Referring MD: [facility]   Chief Complaint  Patient presents with   Sleep Apnea   Hypertension    History of Present Illness:    Justin Carroll is a 66 y.o. male with a hx of ADHD, depression, mild cognitive impairment vs adult ADHD, former heavy drinker (has not had alcohol in > 30 years), CAD s/p NSTEMI, and ischemic cardiomyopathy followed in advanced heart failure clinic.  He tells me that he would have problems with nonrestorative sleep and excessive daytime sleepiness. He was referred for sleep study by Dr. Aundra Dubin.  This showed mild obstructive sleep apnea with an AHI of 11/h and no significant central sleep apnea.  O2 saturations dropped to a low of 87%.  He was started on auto CPAP from 4 to 15 cm H2O and is now here for follow-up.  He is doing well with his CPAP device and thinks that he has gotten used to it.  He tolerates the mask and feels the pressure is adequate.  Since going on CPAP he feels rested in the am and has no significant daytime sleepiness.  He denies any significant mouth or nasal dryness or nasal congestion.  He does not think that he snores.     Past Medical History:  Diagnosis Date   Depression    Sinus infection    yearly    Past Surgical History:  Procedure Laterality Date   ADENOIDECTOMY     CORONARY STENT INTERVENTION N/A 10/18/2020   Procedure: CORONARY STENT INTERVENTION;  Surgeon: Wellington Hampshire, MD;  Location: [facility];  Service: Cardiovascular;  Laterality: N/A;   INTRAVASCULAR IMAGING/OCT N/A 10/18/2020   Procedure: INTRAVASCULAR IMAGING/OCT;  Surgeon: Wellington Hampshire, MD;  Location: [facility];  Service: Cardiovascular;  Laterality: N/A;   LEFT HEART CATH AND CORONARY ANGIOGRAPHY N/A 10/18/2020   Procedure: LEFT HEART CATH AND  CORONARY ANGIOGRAPHY;  Surgeon: Wellington Hampshire, MD;  Location: [facility];  Service: Cardiovascular;  Laterality: N/A;   NASAL SEPTUM SURGERY     SINOSCOPY     TONSILLECTOMY     age 50    Current Medications: Current Meds  Medication Sig   aspirin 81 MG EC tablet Take 1 tablet (81 mg total) by mouth daily. Swallow whole.   atorvastatin (LIPITOR) 80 MG tablet Take 1 tablet (80 mg total) by mouth daily.   Azelastine HCl 137 MCG/SPRAY SOLN USE 2 SPRAY(S) IN EACH NOSTRIL TWICE DAILY AS NEEDED FOR RHINITIS   buPROPion (WELLBUTRIN XL) 300 MG 24 hr tablet Take 1 tablet by mouth once daily   carvedilol (COREG) 12.5 MG tablet Take 1 tablet (12.5 mg total) by mouth 2 (two) times daily with a meal.   Cholecalciferol (VITAMIN D-3 PO) Take 2,000 Units by mouth daily.   dapagliflozin propanediol (FARXIGA) 10 MG TABS tablet Take 1 tablet (10 mg total) by mouth daily.   Evolocumab (REPATHA SURECLICK) 621 MG/ML SOAJ Inject 140 mg into the skin every 14 (fourteen) days.   levocetirizine (XYZAL) 5 MG tablet Take 5 mg by mouth every evening.   LORazepam (ATIVAN) 0.5 MG tablet Take 1 tablet (0.5 mg total) by mouth daily as needed for anxiety.   mometasone (NASONEX) 50 MCG/ACT nasal spray Place 1 spray into the  nose daily.   nitroGLYCERIN (NITROSTAT) 0.4 MG SL tablet DISSOLVE ONE TABLET UNDER THE TONGUE EVERY 5 MINUTES AS NEEDED FOR CHEST PAIN.  DO NOT EXCEED A TOTAL OF 3 DOSES IN 15 MINUTES   pantoprazole (PROTONIX) 40 MG tablet Take 1 tablet by mouth once daily   sacubitril-valsartan (ENTRESTO) 24-26 MG Take 1 tablet by mouth 2 (two) times daily.   sertraline (ZOLOFT) 25 MG tablet Take 1 tablet (25 mg total) by mouth at bedtime.   spironolactone (ALDACTONE) 25 MG tablet Take 1 tablet (25 mg total) by mouth daily.   tamsulosin (FLOMAX) 0.4 MG CAPS capsule TAKE 1 CAPSULE BY MOUTH ONCE DAILY AFTER SUPPER   testosterone cypionate (DEPOTESTOSTERONE CYPIONATE) 200 MG/ML injection INJECT 0.5 MLS INTO THE  MUSCLE TWICE A WEEK.   ticagrelor (BRILINTA) 90 MG TABS tablet Take 1 tablet (90 mg total) by mouth 2 (two) times daily.   traZODone (DESYREL) 100 MG tablet TAKE 1 TABLET BY MOUTH AT BEDTIME     Allergies:   Patient has no known allergies.   Social History   Socioeconomic History   Marital status: Married    Spouse name: Presenter, broadcasting   Number of children: 7   Years of education: Not on file   Highest education level: Professional school degree (e.g., MD, DDS, DVM, JD)  Occupational History   Occupation: Lawyer    Comment: self employeed  Tobacco Use   Smoking status: Former    Packs/day: 1.50    Years: 10.00    Pack years: 15.00    Types: Cigarettes    Start date: 1995    Quit date: 2015    Years since quitting: 8.1   Smokeless tobacco: Never  Vaping Use   Vaping Use: Never used  Substance and Sexual Activity   Alcohol use: No   Drug use: No   Sexual activity: Not on file  Other Topics Concern   Not on file  Social History Narrative   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   Social Determinants of Health   Financial Resource Strain: Medium Risk   Difficulty of Paying Living Expenses: Somewhat hard  Food Insecurity: No Food Insecurity   Worried About Charity fundraiser in the Last Year: Never true   Ran Out of Food in the Last Year: Never true  Transportation Needs: No Transportation Needs   Lack of Transportation (Medical): No   Lack of Transportation (Non-Medical): No  Physical Activity: Not on file  Stress: Not on file  Social Connections: Not on file     Family History: The patient's family history includes Allergic rhinitis in his brother. There is no history of Colon cancer, Pancreatic cancer, Rectal cancer, or Stomach cancer.  ROS:   Please see the history of present illness.    ROS  All other systems reviewed and negative.   EKGs/Labs/Other Studies Reviewed:    The following studies were reviewed today: Sleep study and PAP compliance download  EKG:   EKG is not ordered today.    Recent Labs: 10/18/2020: TSH 0.592 10/24/2020: B Natriuretic Peptide 6.7 12/06/2020: ALT 24 02/21/2021: BUN 20; Creatinine, Ser 1.30; Hemoglobin 18.2; Platelets 166; Potassium 4.9; Sodium 138   Recent Lipid Panel    Component Value Date/Time   CHOL 120 02/21/2021 1257   TRIG 85 02/21/2021 1257   HDL 58 02/21/2021 1257   CHOLHDL 2.1 02/21/2021 1257   VLDL 17 02/21/2021 1257   LDLCALC 45 02/21/2021 1257     Physical Exam:  VS:  BP 110/68    Pulse (!) 57    Ht '5\' 7"'$  (1.702 m)    Wt 174 lb 9.6 oz (79.2 kg)    SpO2 97%    BMI 27.35 kg/m     Wt Readings from Last 3 Encounters:  04/11/21 174 lb 9.6 oz (79.2 kg)  02/21/21 168 lb (76.2 kg)  02/09/21 171 lb 11.8 oz (77.9 kg)     GEN:  Well nourished, well developed in no acute distress HEENT: Normal NECK: No JVD; No carotid bruits LYMPHATICS: No lymphadenopathy CARDIAC: RRR, no murmurs, rubs, gallops RESPIRATORY:  Clear to auscultation without rales, wheezing or rhonchi  ABDOMEN: Soft, non-tender, non-distended MUSCULOSKELETAL:  No edema; No deformity  SKIN: Warm and dry NEUROLOGIC:  Alert and oriented x 3 PSYCHIATRIC:  Normal affect   ASSESSMENT:    1. OSA (obstructive sleep apnea)   2. Essential hypertension    PLAN:    In order of problems listed above:   OSA - The patient is tolerating PAP therapy well without any problems. The PAP download performed by his DME was personally reviewed and interpreted by me today and showed an AHI of 1.1/hr on auto CPAP  with 93% compliance in using more than 4 hours nightly.  The patient has been using and benefiting from PAP use and will continue to benefit from therapy.   2.  Hypertension -BP is controlled on exam today -Continue prescription drug management spironolactone 25 mg daily, Entresto 24-26 mg twice daily, carvedilol 12.5 mg twice daily   Time Spent: 20 minutes total time of encounter, including 15 minutes spent in face-to-face patient care  on the date of this encounter. This time includes coordination of care and counseling regarding above mentioned problem list. Remainder of non-face-to-face time involved reviewing chart documents/testing relevant to the patient encounter and documentation in the medical record. I have independently reviewed documentation from referring provider  Medication Adjustments/Labs and Tests Ordered: Current medicines are reviewed at length with the patient today.  Concerns regarding medicines are outlined above.  No orders of the defined types were placed in this encounter.  No orders of the defined types were placed in this encounter.   Signed, Fransico Him, MD  04/11/2021 9:40 AM    [place]

## 2021-04-18 ENCOUNTER — Ambulatory Visit (INDEPENDENT_AMBULATORY_CARE_PROVIDER_SITE_OTHER): Payer: Medicare Other | Admitting: Neurology

## 2021-04-18 ENCOUNTER — Encounter: Payer: Self-pay | Admitting: Neurology

## 2021-04-18 VITALS — BP 124/81 | HR 61 | Ht 67.0 in | Wt 174.5 lb

## 2021-04-18 DIAGNOSIS — R454 Irritability and anger: Secondary | ICD-10-CM

## 2021-04-18 DIAGNOSIS — R41840 Attention and concentration deficit: Secondary | ICD-10-CM

## 2021-04-18 DIAGNOSIS — R413 Other amnesia: Secondary | ICD-10-CM

## 2021-04-18 MED ORDER — ATOMOXETINE HCL 18 MG PO CAPS: 18.0000 mg | ORAL_CAPSULE | Freq: Every day | ORAL | 5 refills | Status: DC

## 2021-04-18 NOTE — Progress Notes (Signed)
? ? ? ?Provider:  Larey Seat, M D  ?Referring Provider: Cory Munch, PA-C ?Primary Care Physician:  Cory Munch, PA-C ? ?  ?  ? pt with wife, rm 84. pt states that he has been working on some memory exercisies since last visit. wife states he still has some difficulty with short memory. following up post PET scan results. ?Here to f/u post neuro psych evail and disucss further treatment options. MOCA: 29Here to f/u post neuro psych evail and disucss further treatment options. MOCA: 29  ?    ?   ?    ?   ?    ? ? 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 ?and he seen here for a Rv on 04-18-2021. His last 2 visits had been with Justin lomax, NP-  There has been a lot happening in the meantime, he was severely shocked by the death of his MD and friend  Dr Justin Carroll, and he developed a heart attack in September last year. He has an Evaluation with Dr [name]- he could not rule out a fronto temporal dementia,. ?[name] stated his evaluation took place while he was sleep deprived at the time, less than 6 hours each night, as he drove his son to work and had to pick him up.  ?Mrs. Justin Carroll reports that she feels that her husband is easier distracted but these is this is reflected in little things for example he asked him to bring her a mask from the front desk and when he sat down he had forgotten it.  Multitasking has still been hard for him. ?After his heart attack he was seen in the heart failure clinic were Dr. Golden Hurter conducted a sleep study I - a home sleep test .  On the home sleep test showed mild apnea AHI was 11 and it was obstructive.  Oxygen nadir 87% he was started on auto CPAP 4 through 15 cmH2O no EPR as mentioned, I do not know which mask he is using.  But he is apparently feeling more rested in the morning and significant improvement in daytime fatigue.  ? He is not sure how much of his concentration or cognitive ability has been affected.  We also had referred  the patient to Dr. Sima Matas and Dr. Sima Matas felt that his issues were related.  A PET scan was normal.   ?There was just some microvascular ischemic changes on MRI and they would be even age-related normal. I felt this was not increased above peer group.  ? MoCA assessment in January and March 2021 was at first 26 and then 28 points, a repeat MoCA was done today at 88 out of 30 points.  Dr. Baron Sane clearly that the patient has memory difficulties and memory loss but looking at the individual subtests and scores suggest that the greatest issue has to do with a weakened encoding capacity and difficulty to retrieve information.  In fact the patient is able to retain the vast majority of the information he had and then initially learned over a.  Of delay.  Significant differences between predicted levels of performance versus active levels of performance for seen for visual memory.  The assessment was not consistent with pattern typically shown for the most common form of dementia which is Alzheimer's disease but also there were not consistent with frontotemporal dementia. ?   ? ? ? ?09/14/2020 ALL:  ?Mr Justin Carroll returns for follow up  for memory loss and attention deficit. He continues Adderall IR '20mg'$  every morning and an additional '20mg'$  in the evenings if needed.  ?  ?10/20/2019 ALL:  ?Justin Carroll is a 66 y.o. male here today for follow up for memory loss. He was last seen in 04/2019 with normal MOCA. Question of dementia versus ADD. He discontinued Aricept '10mg'$  per PCP direction. Adderall was added in March, 2021. He felt that extended release made him more irritable. He switched to IR dosage and feels that it worked better. He notes a significant difference in ability to focus and concentrate. He stopped taking Adderall and noted a clear worsening in attention. He feels that memory is stable. 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 He feels that  with less cases he will have time to focus and, in turn, decrease stress levels.  ?  ? ? ?Evaluation of Memory was requested by Dr Justin Carroll.. ? ?He is accompanied by his wife. He has not gotten lost , he has not had a fall since last visit! Still grouchy, but he is doing better with orientation, his work flow had been restructured. ?He has resigned form jury trials after our first meeting. This reduced his stress level. He mentioned his stress level in court rose with COVID, the jury trials on [place].  ?He is sleeping well. He is reportedly a recovering alcoholic for 33 years , no medication , no drugs, he is on Wellbutrin. He is breathing better,, following a nasal septal surgery on march 4th. Recovering well. [name].  ? ?His PET scan was normal (!) and his MOCA has a normal result today , too. We may not deal with a dementia at all. 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 ?Neuropsychological testing is pending. For JUNE (!)- Dr. [name].  ?Will see if he can see the new Justin Carroll neuropsychologist.  ?His wife tends to attribute his last years memory decline and recovery to reducing stress and distractions. He may have adult ADD. ADHD ? I will give a try to Ritalin.  He stays on Aricept now 10 mg. requests a 90 day supply.   ?CD  ? ? ?Dr. Anastasio Carroll  added that the patient had come somewhat more irritable at times and his wife used the term "grouchy". He has not lost awareness or consciousness with any of his falls they don't seem to be syncope or seizures but they're sudden. He denies any lightheadedness or vertigo. He has not felt clumsy whilst walking and his speech has not changed he has also asked for an ENT referral as he has frequent sinus congestion. ?He has seen Dr Justin Carroll, he will do a septal deviation repair.  ? ?The family history is positive for a father with coronary artery disease, pulmonary disease/ COPD , who was heavy smoker. A brother was post  mortem diagnosed with vascular dementia.  ? ?There may have been a progression slowly over the last 3 years or so of memory loss being noticeable but over the last 6 months the family has noted a significant progression. Especially with getting lost on a familiar road on a routine drive. ?He has a completely disorganized office, he has misfiled papers, lost papers. His Network engineer and his wife have kept him from taking charts home.  ? ?He has impaired depth perception, has trouble parking a larger vehicle, but stated he has no trouble reading.  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 ?  ?He has very good and very bad days, is usually better in the morning when it comes to cognitive performance. He has lost words, skips sometimes words, non-fluent - he makes more small talk than actual conversation.  ?He denies nocturnal activity, acting out dreams, but is a restless sleeper.  ? ?An MRI brain without contrast did not reveal abnormalities. Dr Justin Carroll ordered. ? ?Part of our exam today was a Montreal cognitive assessment , abbreviated MOCA, and Mr. Maret scored 24 out of 30 points , placing him just at the borderline between a mild cognitive impairment and a beginning dementia.  ?There was impairment with visual-spatial problem-solving to copy a cube was difficult he could name three animals could repeat words and a series of letters he was very good for subtraction and mathematics he remembered 3 out of 5 delay words. His orientation to place and time was completely intact. ? ?Montreal Cognitive Assessment  04/18/2021 09/14/2020 10/20/2019 04/19/2019 02/18/2019  ?Visuospatial/ Executive (0/5) '4 4 5 4 3  '$ ?Naming (0/3) '3 3 3 3 3  '$ ?Attention: Read list of digits (0/2) '2 2 2 2 2  '$ ?Attention: Read list of letters (0/1) '1 1 1 1 1  '$ ?Attention: Serial 7 subtraction starting at 100 (0/3) '3 3 3 3 3  '$ ?Language: Repeat phrase (0/2) '2 1 2 1 2  '$ ?Language : Fluency (0/1) '1 1 1 1  1  '$ ?Abstraction (0/2) '2 2 2 2 2  '$ ?Delayed Recall (0/5) '5 5 2 5 3  '$ ?Orientation (0/6) '6 5 6 6 6  '$ ?Total '29 27 27 28 26  '$ ?Adjusted Score (based on education) - 27 - - -  ? ? ? ?Review of Systems: ?Out of a

## 2021-04-18 NOTE — Patient Instructions (Signed)
Atomoxetine Capsules ?What is this medication? ?ATOMOXETINE (AT oh mox e teen) treats attention-deficit hyperactivity disorder (ADHD). It works by improving focus and reducing impulsive behavior. It belongs to a group of medications called SNRIs. ?This medicine may be used for other purposes; ask your health care provider or pharmacist if you have questions. ?COMMON BRAND NAME(S): Strattera ?What should I tell my care team before I take this medication? ?They need to know if you have any of these conditions: ?Glaucoma ?High or low blood pressure ?History of stroke ?Irregular heartbeat. ?Liver disease ?Mania or bipolar disorder ?Pheochromocytoma ?Suicidal thoughts ?An unusual or allergic reaction to atomoxetine, other medications, foods, dyes, or preservatives ?Pregnant or trying to get pregnant ?Breast-feeding ?How should I use this medication? ?Take this medication by mouth with a glass of water. Follow the directions on the prescription label. You can take it with or without food. If it upsets your stomach, take it with food. If you have difficulty sleeping, and you take more than 1 dose per day, take your last dose before 6 PM. Take your medication at regular intervals. Do not take it more often than directed. Do not stop taking except on your care team's advice. ?A special MedGuide will be given to you by the pharmacist with each prescription and refill. Be sure to read this information carefully each time. ?Talk to your care team regarding the use of this medication in children. While this medication may be prescribed for children as young as 6 years for selected conditions, precautions do apply. ?Overdosage: If you think you have taken too much of this medicine contact a poison control center or emergency room at once. ?NOTE: This medicine is only for you. Do not share this medicine with others. ?What if I miss a dose? ?If you miss a dose, take it as soon as you can. If it is almost time for your next dose,  take only that dose. Do not take double or extra doses. ?What may interact with this medication? ?Do not take this medication with any of the following: ?Cisapride ?Dronedarone ?MAOIs like Carbex, Eldepryl, Marplan, Nardil, and Parnate ?Pimozide ?Reboxetine ?Thioridazine ?This medication may also interact with the following: ?Certain medications for blood pressure, heart disease, irregular heart beat ?Certain medications for depression, anxiety, or psychotic disturbances ?Certain medications for lung disease like albuterol ?Cold or allergy medications ?Dofetilide ?Fluoxetine ?Medications that increase blood pressure like dopamine, dobutamine, or ephedrine ?Other medications that prolong the QT interval (cause an abnormal heart rhythm) ?Paroxetine ?Quinidine ?Stimulant medications for attention disorders, weight loss, or to stay awake ?Ziprasidone ?This list may not describe all possible interactions. Give your health care provider a list of all the medicines, herbs, non-prescription drugs, or dietary supplements you use. Also tell them if you smoke, drink alcohol, or use illegal drugs. Some items may interact with your medicine. ?What should I watch for while using this medication? ?It may take a week or more for this medication to take effect. This is why it is very important to continue taking the medication and not miss any doses. If you have been taking this medication regularly for some time, do not suddenly stop taking it. Ask your care team for advice. ?Rarely, this medication may increase thoughts of suicide or suicide attempts in children and teenagers. Call your child's health care professional right away if your child or teenager has new or increased thoughts of suicide or has changes in mood or behavior like becoming irritable or anxious. Regularly monitor your  child for these behavioral changes. ?For males, contact you care team right away if you have an erection that lasts longer than 4 hours or if it  becomes painful. This may be a sign of serious problem and must be treated right away to prevent permanent damage. ?You may get drowsy or dizzy. Do not drive, use machinery, or do anything that needs mental alertness until you know how this medication affects you. Do not stand or sit up quickly, especially if you are an older patient. This reduces the risk of dizzy or fainting spells. Alcohol can make you more drowsy and dizzy. Avoid alcoholic drinks. ?Do not treat yourself for coughs, colds or allergies without asking your care team for advice. Some ingredients can increase possible side effects. ?Your mouth may get dry. Chewing sugarless gum or sucking hard candy, and drinking plenty of water will help. ?What side effects may I notice from receiving this medication? ?Side effects that you should report to your care team as soon as possible: ?Allergic reactions--skin rash, itching, hives, swelling of the face, lips, tongue, or throat ?Heart rhythm changes--fast or irregular heartbeat, dizziness, feeling faint or lightheaded, chest pain, trouble breathing ?Increase in blood pressure ?Liver injury-- right upper belly pain, loss of appetite, nausea, light-colored stool, dark yellow or brown urine, yellowing skin or eyes, unusual weakness or fatigue ?Mood and behavior changes--anxiety, nervousness, confusion, hallucinations, irritability, hostility, thoughts of suicide or self-harm, worsening mood, feelings of depression ?Painful or prolonged erection ?Stroke in adults--sudden numbness or weakness of the face, arm, or leg, trouble speaking, confusion, trouble walking, loss of balance or coordination, dizziness, severe headache, change in vision ?Trouble passing urine ?Side effects that usually do not require medical attention (report to your care team if they continue or are bothersome): ?Change in sex drive or performance ?Constipation ?Dizziness ?Dry mouth ?Loss of appetite ?Nausea ?Stomach pain ?Vomiting ?This list  may not describe all possible side effects. Call your doctor for medical advice about side effects. You may report side effects to FDA at 1-800-FDA-1088. ?Where should I keep my medication? ?Keep out of the reach of children and pets. ?Store at room temperature between 15 and 30 degrees C (59 and 86 degrees F). Throw away any unused medication after the expiration date. ?NOTE: This sheet is a summary. It may not cover all possible information. If you have questions about this medicine, talk to your doctor, pharmacist, or health care provider. ?? 2022 Elsevier/Gold Standard (2020-02-24 00:00:00) ? ?

## 2021-05-02 ENCOUNTER — Encounter (HOSPITAL_COMMUNITY): Payer: Self-pay | Admitting: Cardiology

## 2021-05-02 ENCOUNTER — Other Ambulatory Visit: Payer: Self-pay

## 2021-05-02 ENCOUNTER — Ambulatory Visit (HOSPITAL_COMMUNITY)
Admission: RE | Admit: 2021-05-02 | Discharge: 2021-05-02 | Disposition: A | Discharge status: Home / Self Care | Payer: Medicare Other | Source: Ambulatory Visit | Attending: Cardiology | Admitting: Cardiology

## 2021-05-02 VITALS — BP 118/78 | HR 62 | Wt 163.0 lb

## 2021-05-02 DIAGNOSIS — N183 Chronic kidney disease, stage 3 unspecified: Secondary | ICD-10-CM | POA: Diagnosis not present

## 2021-05-02 DIAGNOSIS — I255 Ischemic cardiomyopathy: Secondary | ICD-10-CM | POA: Diagnosis not present

## 2021-05-02 DIAGNOSIS — Z79899 Other long term (current) drug therapy: Secondary | ICD-10-CM | POA: Diagnosis not present

## 2021-05-02 DIAGNOSIS — I5022 Chronic systolic (congestive) heart failure: Secondary | ICD-10-CM | POA: Diagnosis not present

## 2021-05-02 DIAGNOSIS — F909 Attention-deficit hyperactivity disorder, unspecified type: Secondary | ICD-10-CM | POA: Diagnosis not present

## 2021-05-02 DIAGNOSIS — I252 Old myocardial infarction: Secondary | ICD-10-CM | POA: Diagnosis not present

## 2021-05-02 DIAGNOSIS — F32A Depression, unspecified: Secondary | ICD-10-CM | POA: Diagnosis not present

## 2021-05-02 DIAGNOSIS — Z7982 Long term (current) use of aspirin: Secondary | ICD-10-CM | POA: Diagnosis not present

## 2021-05-02 DIAGNOSIS — I2510 Atherosclerotic heart disease of native coronary artery without angina pectoris: Secondary | ICD-10-CM | POA: Insufficient documentation

## 2021-05-02 DIAGNOSIS — G4733 Obstructive sleep apnea (adult) (pediatric): Secondary | ICD-10-CM | POA: Insufficient documentation

## 2021-05-02 DIAGNOSIS — I130 Hypertensive heart and chronic kidney disease with heart failure and stage 1 through stage 4 chronic kidney disease, or unspecified chronic kidney disease: Secondary | ICD-10-CM | POA: Insufficient documentation

## 2021-05-02 DIAGNOSIS — F419 Anxiety disorder, unspecified: Secondary | ICD-10-CM | POA: Insufficient documentation

## 2021-05-02 LAB — BASIC METABOLIC PANEL
Anion gap: 7 (ref 5–15)
BUN: 17 mg/dL (ref 8–23)
CO2: 23 mmol/L (ref 22–32)
Calcium: 9.1 mg/dL (ref 8.9–10.3)
Chloride: 105 mmol/L (ref 98–111)
Creatinine, Ser: 1.17 mg/dL (ref 0.61–1.24)
GFR, Estimated: >60 mL/min (ref >60)
Glucose, Bld: 103 mg/dL — ABNORMAL HIGH (ref 70–99)
Potassium: 4.6 mmol/L (ref 3.5–5.1)
Sodium: 135 mmol/L (ref 135–145)

## 2021-05-02 MED ORDER — ENTRESTO 49-51 MG PO TABS: 1.0000 | ORAL_TABLET | Freq: Two times a day (BID) | ORAL | 11 refills | Status: DC

## 2021-05-02 NOTE — Patient Instructions (Signed)
Medication Changes: ? ?Increase Entresto to 49/51 Twice daily ? ? ?Lab Work: ? ?Labs done today, your results will be available in MyChart, we will contact you for abnormal readings. ? ? ? ?Testing/Procedures: ? ?Repeat blood work in 10 days at [facility] ? ?Referrals: ? ?none ? ?Special Instructions // Education: ? ?none ? ?Follow-Up in: 3-4 months  ? ?At the [facility], you and your health needs are our priority. We have a designated team specialized in the treatment of Heart Failure. This Care Team includes your primary Heart Failure Specialized Cardiologist (physician), Advanced Practice Providers (APPs- Physician Assistants and Nurse Practitioners), and Pharmacist who all work together to provide you with the care you need, when you need it.  ? ?You may see any of the following providers on your designated Care Team at your next follow up: ? ?Dr Glori Bickers ?Dr Loralie Champagne ?Darrick Grinder, NP ?Lyda Jester, PA ?Jessica Milford,NP ?Marlyce Huge, PA ?Audry Riles, PharmD ? ? ?Please be sure to bring in all your medications bottles to every appointment.  ? ?Need to Contact us: ? ?If you have any questions or concerns before your next appointment please send [place] a message through [place] or call our office at [phone].   ? ?TO LEAVE A MESSAGE FOR THE NURSE SELECT OPTION 2, PLEASE LEAVE A MESSAGE INCLUDING: ?YOUR NAME ?DATE OF BIRTH ?CALL BACK NUMBER ?REASON FOR CALL**this is important as we prioritize the call backs ? ?YOU WILL RECEIVE A CALL BACK THE SAME DAY AS LONG AS YOU CALL BEFORE 4:00 PM ? ? ?

## 2021-05-03 NOTE — Progress Notes (Signed)
? ? ?Primary Care: Former patient of Dr Zonia Kief Cheraw PA ?Primary Cardiologist: Dr Gwenlyn Found  ?Neurology: Dr Brett Fairy  ?HF Cardiology: Dr Aundra Dubin  ? ?Justin Carroll is a 66 y.o. with a history of ADHD, depression, mild cognitive impairment vs adult ADHD, former heavy drinker (has not had alcohol in > 30 years), CAD s/p NSTEMI, and ischemic cardiomyopathy who was referred to CHF clinic from [facility] clinic.  ? ?Presented to [facility] 10/18/20 with chest pain. HS trop elevated. Had NSTEMI and underwent cath that showed severe one vessel disease mid left circumflex treated with DES.  Plan dual antiplatelet therapy x 12 months. Justin Carroll was added but had AKI so stopped prior to discharge. Prior to admit he was taking 20 mg Adderall everyday with an extra 20 mg 4-5 times a week. Adderall was stopped during hospitalization.  Echo showed EF 35-40%, normal RV.  ? ?Echo was repeated in 1/23, technically difficult study with EF 40%, regional WMAs, RV mildly decreased function.  ? ?Cardiac MRI in 2/23 showed EF 47% with lateral hypokinesis, RV EF 51%, small MI mid anterolateral wall.  ? ?He presents for followup of CHF and CAD.  He has started CPAP and is feeling better.  Working full time as a Chief Executive Officer.  No exertional dyspnea or chest pain.  No lightheadedness.  No orthopnea/PND.  ? ?Labs (9/22): LDL 106 ?Labs (10/22): K 4.5, creatinine 1.28 ?Labs (11/22): LDL 81 ?Labs (1/23): K 4.4, creatinine 1.28 => 1.3, hgb 18.2, LDL 45 ? 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 He has 7 children, several were adopted. Prior smoker, quit 2015.  Prior heavy ETOH, quit in 1980s.   ? ?FH: Father and uncles with MIs in 56s.  ? ?PMH: ?1. OSA: Waiting for CPAP.  ?2. Adult ADHD ?3. Depression ?4. Allergic rhinitis ?5. Hyperlipidemia ?6. HTN ?7. CKD stage 3 ?8. CAD: NSTEMI 9/22 with 85% pLCs stenosis on cath, treated with DES.  ?9. Chronic systolic CHF: Ischemic (versus mixed ischemic/nonischemic) cardiomyopathy.   ?- Echo (9/22):  EF 35-40%, normal RV.  ?- Echo (1/23): technically difficult study with EF 40%, regional WMAs, RV mildly decreased function. ?- cMRI (2/23): EF 47% with lateral hypokinesis, RV EF 51%, small MI mid anterolateral wall. ? ?Review of Systems: All systems reviewed and negative except as per HPI.  ? ?Current Outpatient Medications  ?Medication Sig Dispense Refill  ? aspirin 81 MG EC tablet Take 1 tablet (81 mg total) by mouth daily. Swallow whole. 90 tablet 3  ? atomoxetine (STRATTERA) 18 MG capsule Take 1 capsule (18 mg total) by mouth daily. 30 capsule 5  ? atorvastatin (LIPITOR) 80 MG tablet Take 1 tablet (80 mg total) by mouth daily. 90 tablet 3  ? Azelastine HCl 137 MCG/SPRAY SOLN USE 2 SPRAY(S) IN EACH NOSTRIL TWICE DAILY AS NEEDED FOR RHINITIS 30 mL 2  ? buPROPion (WELLBUTRIN XL) 300 MG 24 hr tablet Take 1 tablet by mouth once daily 90 tablet 0  ? carvedilol (COREG) 12.5 MG tablet Take 1 tablet (12.5 mg total) by mouth 2 (two) times daily with a meal. 60 tablet 11  ? Cholecalciferol (VITAMIN D-3 PO) Take 2,000 Units by mouth daily.    ? dapagliflozin propanediol (FARXIGA) 10 MG TABS tablet Take 1 tablet (10 mg total) by mouth daily. 30 tablet 11  ? Evolocumab (REPATHA SURECLICK) 578 MG/ML SOAJ Inject 140 mg into the skin every 14 (fourteen) days. 2 mL 12  ? levocetirizine (XYZAL) 5 MG tablet  Take 5 mg by mouth every evening.    ? LORazepam (ATIVAN) 0.5 MG tablet Take 1 tablet (0.5 mg total) by mouth daily as needed for anxiety. 7 tablet 0  ? mometasone (NASONEX) 50 MCG/ACT nasal spray Place 1 spray into the nose daily. 17 g 5  ? nitroGLYCERIN (NITROSTAT) 0.4 MG SL tablet DISSOLVE ONE TABLET UNDER THE TONGUE EVERY 5 MINUTES AS NEEDED FOR CHEST PAIN.  DO NOT EXCEED A TOTAL OF 3 DOSES IN 15 MINUTES 25 tablet 3  ? pantoprazole (PROTONIX) 40 MG tablet Take 1 tablet by mouth once daily 90 tablet 0  ? sacubitril-valsartan (ENTRESTO) 49-51 MG Take 1 tablet by mouth 2 (two) times daily. 60 tablet 11  ? sertraline (ZOLOFT)  25 MG tablet Take 1 tablet (25 mg total) by mouth at bedtime. 30 tablet 11  ? spironolactone (ALDACTONE) 25 MG tablet Take 1 tablet (25 mg total) by mouth daily. 30 tablet 11  ? tamsulosin (FLOMAX) 0.4 MG CAPS capsule TAKE 1 CAPSULE BY MOUTH ONCE DAILY AFTER SUPPER 90 capsule 0  ? testosterone cypionate (DEPOTESTOSTERONE CYPIONATE) 200 MG/ML injection INJECT 0.5 MLS INTO THE MUSCLE TWICE A WEEK. 10 mL 2  ? ticagrelor (BRILINTA) 90 MG TABS tablet Take 1 tablet (90 mg total) by mouth 2 (two) times daily. 60 tablet 11  ? traZODone (DESYREL) 100 MG tablet TAKE 1 TABLET BY MOUTH AT BEDTIME 90 tablet 0  ? ?No current facility-administered medications for this encounter.  ? ? ?No Known Allergies ? ? ?Vitals:  ? 05/02/21 1044  ?BP: 118/78  ?Pulse: 62  ?SpO2: 96%  ?Weight: 73.9 kg (163 lb)  ? ?Wt Readings from Last 3 Encounters:  ?05/02/21 73.9 kg (163 lb)  ?04/18/21 79.2 kg (174 lb 8 oz)  ?04/11/21 79.2 kg (174 lb 9.6 oz)  ? ? ?PHYSICAL EXAM: ?General: NAD ?Neck: No JVD, no thyromegaly or thyroid nodule.  ?Lungs: Clear to auscultation bilaterally with normal respiratory effort. ?CV: Nondisplaced PMI.  Heart regular S1/S2, no S3/S4, no murmur.  No peripheral edema.  No carotid bruit.  Normal pedal pulses.  ?Abdomen: Soft, nontender, no hepatosplenomegaly, no distention.  ?Skin: Intact without lesions or rashes.  ?Neurologic: Alert and oriented x 3.  ?Psych: Normal affect. ?Extremities: No clubbing or cyanosis.  ?HEENT: Normal.  ? ?ASSESSMENT & PLAN: ?1. 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  Echo in 9/22 with EF 35-40%. LHC showed single vessel CAD involving mid left circumflex. Cardiomyopathy seemed out of proportion to CAD.  Echo in 1/23 was a difficult study, EF estimated to be around 40%. Cardiac MRI was done in 2/23, showing  EF 47% with lateral hypokinesis, RV EF 51%, small MI mid anterolateral wall.  NYHA class II symptoms, not volume overloaded on exam. ?- Continue Coreg 12.5 mg  bid. .    ?- Continue farxiga 10 mg daily.  ?- Increase Entresto to 49/51 bid, BMET today and in 10 days.   ?- Continue spironolactone 25 mg daily.  ?- LV EF is out of range for ICD.  ?2. CAD: NSTEMI 9/22, DES to pLCx.  No chest pain.  ?- Continue ASA 81 daily and ticagrelor 90 bid.  ?- Continue atorvastatin, good lipids in 1/23.  ?3. CKD: Stage 3.  BMET today.  ?4. Depression/anxiety: Continue sertraline 25 mg daily.  ?5. OSA: Using CPAP.   ? ?Followup in 3-4 months  ? ?Loralie Champagne ?05/03/2021 ? ? ?

## 2021-05-03 NOTE — Addendum Note (Signed)
Encounter addended by: Larey Dresser, MD on: 05/03/2021 2:59 PM ? Actions taken: Level of Service modified

## 2021-05-07 ENCOUNTER — Encounter (HOSPITAL_COMMUNITY): Payer: Self-pay

## 2021-05-08 ENCOUNTER — Other Ambulatory Visit (HOSPITAL_COMMUNITY): Payer: Self-pay | Admitting: *Deleted

## 2021-05-15 ENCOUNTER — Other Ambulatory Visit (HOSPITAL_COMMUNITY)
Admission: RE | Admit: 2021-05-15 | Discharge: 2021-05-15 | Disposition: A | Discharge status: Home / Self Care | Payer: Medicare Other | Source: Ambulatory Visit | Attending: Cardiology | Admitting: Cardiology

## 2021-05-15 DIAGNOSIS — I5022 Chronic systolic (congestive) heart failure: Secondary | ICD-10-CM | POA: Diagnosis present

## 2021-05-15 DIAGNOSIS — Z79899 Other long term (current) drug therapy: Secondary | ICD-10-CM | POA: Insufficient documentation

## 2021-05-15 DIAGNOSIS — N179 Acute kidney failure, unspecified: Secondary | ICD-10-CM | POA: Insufficient documentation

## 2021-05-15 LAB — BASIC METABOLIC PANEL
Anion gap: 8 (ref 5–15)
BUN: 18 mg/dL (ref 8–23)
CO2: 21 mmol/L — ABNORMAL LOW (ref 22–32)
Calcium: 8.9 mg/dL (ref 8.9–10.3)
Chloride: 104 mmol/L (ref 98–111)
Creatinine, Ser: 1.18 mg/dL (ref 0.61–1.24)
GFR, Estimated: >60 mL/min (ref >60)
Glucose, Bld: 93 mg/dL (ref 70–99)
Potassium: 4.2 mmol/L (ref 3.5–5.1)
Sodium: 133 mmol/L — ABNORMAL LOW (ref 135–145)

## 2021-05-23 ENCOUNTER — Encounter: Payer: Medicare Other | Attending: Psychology | Admitting: Psychology

## 2021-05-23 ENCOUNTER — Ambulatory Visit: Payer: Medicare Other | Admitting: Psychology

## 2021-05-23 DIAGNOSIS — R41840 Attention and concentration deficit: Secondary | ICD-10-CM | POA: Insufficient documentation

## 2021-05-23 DIAGNOSIS — R413 Other amnesia: Secondary | ICD-10-CM | POA: Diagnosis present

## 2021-05-23 DIAGNOSIS — I679 Cerebrovascular disease, unspecified: Secondary | ICD-10-CM | POA: Diagnosis present

## 2021-05-23 NOTE — Progress Notes (Signed)
05/23/2021 8 AM-9 AM: Today's visit was an in person visit as conducted in outpatient clinic office.  Today I provided feedback regarding the results of the recent neuropsychological evaluation.  I have included a copy of the summary and diagnostic impressions below for convenience and the entire neuropsychological evaluation can be found in his EMR dated 04/10/2021.  We reviewed the results and talked about some of the implications and worked on specific recommendations for him going forward.  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  The patient has started taking Strattera to try to help with some of his concentration and attention issues.  The patient is also continuing to take Zoloft and Wellbutrin as well. ? ? ?Summary of Results:                        Overall, the results of the current neuropsychological evaluation do suggest that the greatest observed cognitive deficits relative to premorbid estimates were in areas primarily affected by attention and concentration, focus execute abilities and information processing speed.  The patient displayed relatively well-preserved verbal based skills including verbal reasoning and problem-solving, general fund of information, vocabulary knowledge and social judgment and comprehension.  The patient also appears to do relatively well on verbal based language skills even though he describes word finding difficulties and word substitution type symptoms.  The patient performed generally consistent with age-matched and education matched comparison groups for both lexical fluency as well as semantic fluency.  The patient did show some significant loss of global cognitive performance with specific primary weaknesses or potential changes in the areas of visual spatial and visual reasoning capacity, visual scanning and overall speed of mental operations as the primary component of  cognitive changes.  The patient did better on auditory encoding versus visual encoding and there was a significant difference between auditory memory and learning versus visual memory and learning with visual memory and learning showing the greatest deficits.  In-depth analysis suggest that the patient displayed significant improvements on his visual memory components under recognition/cued recall.  It appears that the primary visual deficits had to do with impaired encoding and deficits for retrieval.  The information that he had initially learned was well preserved over a delayed for both auditory and visual learning with significant improvements under recognition formats.  Overall, while the patient does show a decrease in global cognitive functioning the primary etiological factors do not appear to be focal deficits in frontal or temporal regions but greater issues with regard to attention and concentration and information processing speed but he does show patterns of greater impairments for right hemisphere type tasks versus left hemisphere type tasks. ?  ?Impression/Diagnosis:                     Overall, the results of the current neuropsychological evaluation do show consistencies between the patient's subjective reports and objective cognitive assessment.  The patient describes memory deficits, worsening attentional issues in the setting of his reported difficulties with attention and concentration, which are having the primary impact on his overall cognitive functioning.  The patient is well maintained verbal based skills including verbal reasoning and problem-solving with generally well-preserved visual reasoning and problem solving although performance is below predicted levels but still in the average range relative to a normative population.  His primary deficits had to do with significant reduction in  information processing speed and visual scanning and visual searching abilities as well as issues  with visual encoding capacity.  Significant improvements in memory under recognition formats and stability of information over a period of delay and learning task were also noted. ? ?As far as diagnostic considerations, this assessment is not consistent with patterns typically associated with Alzheimer's type progressive dementia and he also shows some significant differences between those typically seen with frontotemporal dementia.  The patient is having cognitive changes that are primarily in the attentional domain including focus execute deficits and information processing speed deficits as well as retrieval deficits for memory.  The information that is initially learned is retained over a period of delay without significant loss and there is significant improvements under recognition formats not typical of Alzheimer's or frontotemporal dementia.  The patient is experiencing some verbal fluency changes but objective assessment of semantic and lexical fluency did not show any significant deficits but potentially a loss from excellent premorbid functioning.  The patient has had a heart attack previously and ongoing cardiovascular health issues including mild to moderate reduced cognitive functioning, which would suggest cardiovascular and likely cerebrovascular changes.  He is described a lifelong issue with attention and concentration and significant worsening of attention and concentration recently.  While the MRI had significant limitations in interpretation due to motion degradation there were indications of some microvascular ischemic and small vessel changes.  The patient also had recent PET scan conducted in January 2021 due to concerns of possible frontotemporal dementia versus Alzheimer's type dementia.  The results showed no decreased cortical metabolism within the parietal lobes to suggest Alzheimer's pathology and no decreased frontal lobe cortical activity to suggest frontotemporal dementia.  While  this PET scan is not diagnostic in and of itself it is consistent with results of the current neuropsychological evaluation.  I suspect that the primary culprit for his observed cognitive changes have to do with cerebrovascular changes and small vessel disease in conjunction with pre-existing attentional deficits.  However, given the fact that we may be early in the assessment as the patient had likely performed in the superior range and there is a global decrease in cognitive functioning from these premorbid estimates it will be important to have repeat testing in approximately 9 months or so to add in the strength of this cognitive assessment.  We cannot completely rule out the possibility of a frontotemporal dementia given his excellent premorbid level of cognitive functioning but at this point the primary deficits appear to be attentional component including information processing speed and focus execute abilities and significant impairment with visual encoding and retrieval of information rather than an inability to effectively store and organize and retain that information over a period of delay.  There is considerable indication of lateralization with greater deficits for right hemisphere type functions versus left hemisphere type functions. ? ?I will sit down with the patient and go over the results of the recent neuropsychological evaluation and the plan and need for repeat testing in approximately 9 to 12 months. ?  ?Diagnosis:                              ?  ?Memory loss, short term ?  ?Attention and concentration deficit ?  ?Cognitive attention deficit ?  ?Small vessel disease, cerebrovascular ?  ?  ?_____________________ ?Ilean Skill, Psy.D. ?Clinical Neuropsychologist ?

## 2021-05-28 ENCOUNTER — Other Ambulatory Visit (HOSPITAL_COMMUNITY): Payer: Self-pay | Admitting: *Deleted

## 2021-05-28 MED ORDER — ENTRESTO 49-51 MG PO TABS: 1.0000 | ORAL_TABLET | Freq: Two times a day (BID) | ORAL | 3 refills | Status: DC

## 2021-05-28 NOTE — Telephone Encounter (Signed)
It should be coming from [facility]. Can you send a 90 day rx with 3 refills to [facility] [address]. The patient should call them tomorrow.

## 2021-05-29 ENCOUNTER — Ambulatory Visit: Payer: Medicare Other | Admitting: Psychology

## 2021-05-30 ENCOUNTER — Ambulatory Visit: Payer: Medicare Other | Admitting: Neurology

## 2021-06-08 ENCOUNTER — Other Ambulatory Visit (HOSPITAL_COMMUNITY): Payer: Self-pay | Admitting: *Deleted

## 2021-06-08 MED ORDER — ENTRESTO 49-51 MG PO TABS: 1.0000 | ORAL_TABLET | Freq: Two times a day (BID) | ORAL | 3 refills | Status: DC

## 2021-08-01 NOTE — Progress Notes (Signed)
Primary Care: Former patient of Dr Justin Carroll [place] PA Primary Cardiologist: Dr Justin Carroll  Neurology: Dr Justin Carroll  HF Cardiology: Dr Justin Carroll   Justin Carroll is a 66 y.o. with a history of ADHD, depression, mild cognitive impairment vs adult ADHD, former heavy drinker (has not had alcohol in > 30 years), CAD s/p NSTEMI, OSA on CPAP and ischemic cardiomyopathy who was referred to CHF clinic from [facility] clinic.   Presented to [facility] 10/18/20 with chest pain. Had NSTEMI and underwent cath that showed severe one vessel disease mid left circumflex treated with DES. Justin Carroll was added but had AKI so stopped prior to discharge. Prior to admit he was taking 20 mg Adderall everyday with an extra 20 mg 4-5 times a week. Adderall was stopped during hospitalization.  Echo showed EF 35-40%, normal RV.   Echo was repeated in 1/23, technically difficult study with EF 40%, regional WMAs, RV mildly decreased function.   Cardiac MRI in 2/23 showed EF 47% with lateral hypokinesis, RV EF 51%, small MI mid anterolateral wall.   Here today for 3 month follow-up.    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 Prior smoker, quit 2015.  Prior heavy ETOH, quit in 1980s.    FH: Father and uncles with MIs in 58s.   PMH: 1. OSA: Waiting for CPAP.  2. Adult ADHD 3. Depression 4. Allergic rhinitis 5. Hyperlipidemia 6. HTN 7. CKD stage 3 8. CAD: NSTEMI 9/22 with 85% pLCs stenosis on cath, treated with DES.  9. Chronic systolic CHF: Ischemic (versus mixed ischemic/nonischemic) cardiomyopathy.   - Echo (9/22): EF 35-40%, normal RV.  - Echo (1/23): technically difficult study with EF 40%, regional WMAs, RV mildly decreased function. - cMRI (2/23): EF 47% with lateral hypokinesis, RV EF 51%, small MI mid anterolateral wall.  Review of Systems: All systems reviewed and negative except as per HPI.   Current Outpatient Medications  Medication Sig Dispense  Refill   aspirin 81 MG EC tablet Take 1 tablet (81 mg total) by mouth daily. Swallow whole. 90 tablet 3   atomoxetine (STRATTERA) 18 MG capsule Take 1 capsule (18 mg total) by mouth daily. 30 capsule 5   atorvastatin (LIPITOR) 80 MG tablet Take 1 tablet (80 mg total) by mouth daily. 90 tablet 3   Azelastine HCl 137 MCG/SPRAY SOLN USE 2 SPRAY(S) IN EACH NOSTRIL TWICE DAILY AS NEEDED FOR RHINITIS 30 mL 2   buPROPion (WELLBUTRIN XL) 300 MG 24 hr tablet Take 1 tablet by mouth once daily 90 tablet 0   carvedilol (COREG) 12.5 MG tablet Take 1 tablet (12.5 mg total) by mouth 2 (two) times daily with a meal. 60 tablet 11   Cholecalciferol (VITAMIN D-3 PO) Take 2,000 Units by mouth daily.     dapagliflozin propanediol (FARXIGA) 10 MG TABS tablet Take 1 tablet (10 mg total) by mouth daily. 30 tablet 11   Evolocumab (REPATHA SURECLICK) 619 MG/ML SOAJ Inject 140 mg into the skin every 14 (fourteen) days. 2 mL 12   levocetirizine (XYZAL) 5 MG tablet Take 5 mg by mouth every evening.     LORazepam (ATIVAN) 0.5 MG tablet Take 1 tablet (0.5 mg total) by mouth daily as needed for anxiety. 7 tablet 0   mometasone (NASONEX) 50 MCG/ACT nasal spray Place 1 spray into the nose daily. 17 g 5   nitroGLYCERIN (NITROSTAT) 0.4 MG SL tablet DISSOLVE ONE TABLET UNDER THE TONGUE EVERY  5 MINUTES AS NEEDED FOR CHEST PAIN.  DO NOT EXCEED A TOTAL OF 3 DOSES IN 15 MINUTES 25 tablet 3   pantoprazole (PROTONIX) 40 MG tablet Take 1 tablet by mouth once daily 90 tablet 0   sacubitril-valsartan (ENTRESTO) 49-51 MG Take 1 tablet by mouth 2 (two) times daily. 180 tablet 3   sertraline (ZOLOFT) 25 MG tablet Take 1 tablet (25 mg total) by mouth at bedtime. 30 tablet 11   spironolactone (ALDACTONE) 25 MG tablet Take 1 tablet (25 mg total) by mouth daily. 30 tablet 11   tamsulosin (FLOMAX) 0.4 MG CAPS capsule TAKE 1 CAPSULE BY MOUTH ONCE DAILY AFTER SUPPER 90 capsule 0   testosterone cypionate (DEPOTESTOSTERONE CYPIONATE) 200 MG/ML injection  INJECT 0.5 MLS INTO THE MUSCLE TWICE A WEEK. 10 mL 2   ticagrelor (BRILINTA) 90 MG TABS tablet Take 1 tablet (90 mg total) by mouth 2 (two) times daily. 60 tablet 11   traZODone (DESYREL) 100 MG tablet TAKE 1 TABLET BY MOUTH AT BEDTIME 90 tablet 0   No current facility-administered medications for this visit.    No Known Allergies   There were no vitals filed for this visit.  Wt Readings from Last 3 Encounters:  05/02/21 73.9 kg (163 lb)  04/18/21 79.2 kg (174 lb 8 oz)  04/11/21 79.2 kg (174 lb 9.6 oz)    PHYSICAL EXAM: General: NAD Neck: No JVD, no thyromegaly or thyroid nodule.  Lungs: Clear to auscultation bilaterally with normal respiratory effort. CV: Nondisplaced PMI.  Heart regular S1/S2, no S3/S4, no murmur.  No peripheral edema.  No carotid bruit.  Normal pedal pulses.  Abdomen: Soft, nontender, no hepatosplenomegaly, no distention.  Skin: Intact without lesions or rashes.  Neurologic: Alert and oriented x 3.  Psych: Normal affect. Extremities: No clubbing or cyanosis.  HEENT: Normal.   ASSESSMENT & PLAN: 1. 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  Echo in 9/22 with EF 35-40%. LHC showed single vessel CAD involving mid left circumflex. Cardiomyopathy seemed out of proportion to CAD.  Echo in 1/23 was a difficult study, EF estimated to be around 40%. Cardiac MRI was done in 2/23, showing  EF 47% with lateral hypokinesis, RV EF 51%, small MI mid anterolateral wall.  NYHA class II symptoms, volume *** - Continue Coreg 12.5 mg bid.  - Continue farxiga 10 mg daily.  - Continue Entresto to 49/51 bid - Continue spironolactone 25 mg daily.  - LV EF is out of range for ICD.  2. CAD: NSTEMI 9/22, DES to pLCx.  No chest pain.  - Continue ASA 81 daily and ticagrelor 90 bid.  - Continue atorvastatin, good lipids in 1/23.  3. CKD: Stage 3.  BMET today.  4. Depression/anxiety: Continue sertraline 25 mg daily.  5. OSA: Using CPAP.    Follow-up:  ***  Justin Carroll N 08/01/2021

## 2021-08-02 ENCOUNTER — Ambulatory Visit (HOSPITAL_COMMUNITY)
Admission: RE | Admit: 2021-08-02 | Discharge: 2021-08-02 | Disposition: A | Discharge status: Home / Self Care | Payer: Medicare Other | Source: Ambulatory Visit | Attending: Physician Assistant | Admitting: Physician Assistant

## 2021-08-02 VITALS — BP 98/68 | HR 63 | Wt 170.4 lb

## 2021-08-02 DIAGNOSIS — I951 Orthostatic hypotension: Secondary | ICD-10-CM

## 2021-08-02 DIAGNOSIS — F32A Depression, unspecified: Secondary | ICD-10-CM | POA: Diagnosis not present

## 2021-08-02 DIAGNOSIS — I252 Old myocardial infarction: Secondary | ICD-10-CM | POA: Diagnosis not present

## 2021-08-02 DIAGNOSIS — I5022 Chronic systolic (congestive) heart failure: Secondary | ICD-10-CM | POA: Insufficient documentation

## 2021-08-02 DIAGNOSIS — Z955 Presence of coronary angioplasty implant and graft: Secondary | ICD-10-CM | POA: Insufficient documentation

## 2021-08-02 DIAGNOSIS — Z7982 Long term (current) use of aspirin: Secondary | ICD-10-CM | POA: Insufficient documentation

## 2021-08-02 DIAGNOSIS — F909 Attention-deficit hyperactivity disorder, unspecified type: Secondary | ICD-10-CM | POA: Diagnosis not present

## 2021-08-02 DIAGNOSIS — Z79899 Other long term (current) drug therapy: Secondary | ICD-10-CM | POA: Insufficient documentation

## 2021-08-02 DIAGNOSIS — N182 Chronic kidney disease, stage 2 (mild): Secondary | ICD-10-CM | POA: Diagnosis not present

## 2021-08-02 DIAGNOSIS — Z9989 Dependence on other enabling machines and devices: Secondary | ICD-10-CM | POA: Diagnosis not present

## 2021-08-02 DIAGNOSIS — I130 Hypertensive heart and chronic kidney disease with heart failure and stage 1 through stage 4 chronic kidney disease, or unspecified chronic kidney disease: Secondary | ICD-10-CM | POA: Insufficient documentation

## 2021-08-02 DIAGNOSIS — I2510 Atherosclerotic heart disease of native coronary artery without angina pectoris: Secondary | ICD-10-CM | POA: Diagnosis not present

## 2021-08-02 DIAGNOSIS — G4733 Obstructive sleep apnea (adult) (pediatric): Secondary | ICD-10-CM | POA: Diagnosis not present

## 2021-08-02 LAB — CBC
HCT: 48.7 % (ref 39.0–52.0)
Hemoglobin: 15.6 g/dL (ref 13.0–17.0)
MCH: 31.2 pg (ref 26.0–34.0)
MCHC: 32 g/dL (ref 30.0–36.0)
MCV: 97.4 fL (ref 80.0–100.0)
Platelets: 175 10*3/uL (ref 150–400)
RBC: 5 MIL/uL (ref 4.22–5.81)
RDW: 14.6 % (ref 11.5–15.5)
WBC: 5.9 10*3/uL (ref 4.0–10.5)
nRBC: 0 % (ref 0.0–0.2)

## 2021-08-02 LAB — BASIC METABOLIC PANEL
Anion gap: 8 (ref 5–15)
BUN: 17 mg/dL (ref 8–23)
CO2: 25 mmol/L (ref 22–32)
Calcium: 8.9 mg/dL (ref 8.9–10.3)
Chloride: 103 mmol/L (ref 98–111)
Creatinine, Ser: 1.22 mg/dL (ref 0.61–1.24)
GFR, Estimated: >60 mL/min (ref >60)
Glucose, Bld: 109 mg/dL — ABNORMAL HIGH (ref 70–99)
Potassium: 4.3 mmol/L (ref 3.5–5.1)
Sodium: 136 mmol/L (ref 135–145)

## 2021-08-02 LAB — BRAIN NATRIURETIC PEPTIDE: B Natriuretic Peptide: 12.5 pg/mL (ref 0.0–100.0)

## 2021-08-02 MED ORDER — ENTRESTO 24-26 MG PO TABS: 1.0000 | ORAL_TABLET | Freq: Two times a day (BID) | ORAL | 11 refills | Status: DC

## 2021-08-02 NOTE — Patient Instructions (Addendum)
EKG done today.   Labs done today. We will contact you only if your labs are abnormal.  DECREASE Entresto to 24-'26mg'$  (1 tablet) by mouth 2 times daily.   No medication changes were made. Please continue all current medications as prescribed.  Your physician recommends that you schedule a follow-up appointment in: 2-3 weeks  If you have any questions or concerns before your next appointment please send [place] a message through [place] or call our office at [phone].    TO LEAVE A MESSAGE FOR THE NURSE SELECT OPTION 2, PLEASE LEAVE A MESSAGE INCLUDING: YOUR NAME DATE OF BIRTH CALL BACK NUMBER REASON FOR CALL**this is important as we prioritize the call backs  YOU WILL RECEIVE A CALL BACK THE SAME DAY AS LONG AS YOU CALL BEFORE 4:00 PM   Do the following things EVERYDAY: Weigh yourself in the morning before breakfast. Write it down and keep it in a log. Take your medicines as prescribed Eat low salt foods--Limit salt (sodium) to 2000 mg per day.  Stay as active as you can everyday Limit all fluids for the day to less than 2 liters   At the [facility], you and your health needs are our priority. As part of our continuing mission to provide you with exceptional heart care, we have created designated Provider Care Teams. These Care Teams include your primary Cardiologist (physician) and Advanced Practice Providers (APPs- Physician Assistants and Nurse Practitioners) who all work together to provide you with the care you need, when you need it.   You may see any of the following providers on your designated Care Team at your next follow up: Dr Glori Bickers Dr Haynes Kerns, NP Lyda Jester, [place] Audry Riles, PharmD   Please be sure to bring in all your medications bottles to every appointment.

## 2021-08-09 DIAGNOSIS — G4733 Obstructive sleep apnea (adult) (pediatric): Secondary | ICD-10-CM | POA: Diagnosis not present

## 2021-08-18 DIAGNOSIS — G4733 Obstructive sleep apnea (adult) (pediatric): Secondary | ICD-10-CM | POA: Diagnosis not present

## 2021-08-22 ENCOUNTER — Other Ambulatory Visit: Payer: Self-pay | Admitting: Cardiology

## 2021-08-29 NOTE — Progress Notes (Signed)
Primary Care: Former patient of Dr Zonia Kief [place] PA Primary Cardiologist: Dr Gwenlyn Found  Neurology: Dr Dohmeier  HF Cardiology: Dr Aundra Dubin   Justin Carroll is a 66 y.o. with a history of ADHD, depression, mild cognitive impairment vs adult ADHD, former heavy drinker (has not had alcohol in > 30 years), CAD s/p NSTEMI, OSA on CPAP and ischemic cardiomyopathy who was referred to CHF clinic from [facility] clinic.   Presented to [facility] 10/18/20 with chest pain. Had NSTEMI and underwent cath that showed severe one vessel disease mid left circumflex treated with DES. Justin Carroll was added but had AKI so stopped prior to discharge. Prior to admit he was taking 20 mg Adderall everyday with an extra 20 mg 4-5 times a week. Adderall was stopped during hospitalization.  Echo showed EF 35-40%, normal RV.   Echo was repeated in 1/23, technically difficult study with EF 40%, regional WMAs, RV mildly decreased function.   Cardiac MRI in 2/23 showed EF 47% with lateral hypokinesis, RV EF 51%, small MI mid anterolateral wall.   Entresto increased to 49/51 mg BID at last f/u in 03/23.  Today he returns for HF follow up. Last visit entresto was cut back to 24-26 mg due to dizziness/hypotension. Overall feeling fine. Denies SOB/PND/Orthopnea. No bleeding issues. No chest pain. Walking [id number] steps 5-6 days a week. Appetite ok. No fever or chills. Weight at home has been stable. 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 He has 7 children, several were adopted. Prior smoker, quit 2015.  Prior heavy ETOH, quit in 1980s.    FH: Father and uncles with MIs in 44s.   PMH: 1. OSA: Waiting for CPAP.  2. Adult ADHD 3. Depression 4. Allergic rhinitis 5. Hyperlipidemia 6. HTN 7. CKD stage 3 8. CAD: NSTEMI 9/22 with 85% pLCs stenosis on cath, treated with DES.  9. Chronic systolic CHF: Ischemic (versus mixed ischemic/nonischemic) cardiomyopathy.   - Echo (9/22): EF 35-40%,  normal RV.  - Echo (1/23): technically difficult study with EF 40%, regional WMAs, RV mildly decreased function. - cMRI (2/23): EF 47% with lateral hypokinesis, RV EF 51%, small MI mid anterolateral wall.  Review of Systems: All systems reviewed and negative except as per HPI.   Current Outpatient Medications  Medication Sig Dispense Refill   aspirin 81 MG EC tablet Take 1 tablet (81 mg total) by mouth daily. Swallow whole. 90 tablet 3   atomoxetine (STRATTERA) 18 MG capsule Take 1 capsule (18 mg total) by mouth daily. 30 capsule 5   atorvastatin (LIPITOR) 80 MG tablet TAKE ONE (1) TABLET BY MOUTH EVERY DAY 90 tablet 3   Azelastine HCl 137 MCG/SPRAY SOLN USE 2 SPRAY(S) IN EACH NOSTRIL TWICE DAILY AS NEEDED FOR RHINITIS 30 mL 2   buPROPion (WELLBUTRIN XL) 300 MG 24 hr tablet Take 1 tablet by mouth once daily 90 tablet 0   carvedilol (COREG) 12.5 MG tablet Take 1 tablet (12.5 mg total) by mouth 2 (two) times daily with a meal. 60 tablet 11   Cholecalciferol (VITAMIN D-3 PO) Take 2,000 Units by mouth daily.     dapagliflozin propanediol (FARXIGA) 10 MG TABS tablet Take 1 tablet (10 mg total) by mouth daily. 30 tablet 11   Evolocumab (REPATHA SURECLICK) 408 MG/ML SOAJ Inject 140 mg into the skin every 14 (fourteen) days. 2 mL 12   levocetirizine (XYZAL) 5 MG tablet Take 5 mg by mouth every evening.  LORazepam (ATIVAN) 0.5 MG tablet Take 1 tablet (0.5 mg total) by mouth daily as needed for anxiety. 7 tablet 0   mometasone (NASONEX) 50 MCG/ACT nasal spray Place 1 spray into the nose daily. 17 g 5   nitroGLYCERIN (NITROSTAT) 0.4 MG SL tablet DISSOLVE ONE TABLET UNDER THE TONGUE EVERY 5 MINUTES AS NEEDED FOR CHEST PAIN.  DO NOT EXCEED A TOTAL OF 3 DOSES IN 15 MINUTES 25 tablet 3   pantoprazole (PROTONIX) 40 MG tablet Take 1 tablet by mouth once daily 90 tablet 0   sacubitril-valsartan (ENTRESTO) 24-26 MG Take 1 tablet by mouth 2 (two) times daily. 60 tablet 11   sertraline (ZOLOFT) 25 MG tablet  Take 1 tablet (25 mg total) by mouth at bedtime. 30 tablet 11   spironolactone (ALDACTONE) 25 MG tablet Take 1 tablet (25 mg total) by mouth daily. 30 tablet 11   tamsulosin (FLOMAX) 0.4 MG CAPS capsule TAKE 1 CAPSULE BY MOUTH ONCE DAILY AFTER SUPPER 90 capsule 0   testosterone cypionate (DEPOTESTOSTERONE CYPIONATE) 200 MG/ML injection INJECT 0.5 MLS INTO THE MUSCLE TWICE A WEEK. 10 mL 2   ticagrelor (BRILINTA) 90 MG TABS tablet Take 1 tablet (90 mg total) by mouth 2 (two) times daily. 60 tablet 11   traZODone (DESYREL) 100 MG tablet TAKE 1 TABLET BY MOUTH AT BEDTIME 90 tablet 0   No current facility-administered medications for this encounter.    No Known Allergies   Vitals:   08/30/21 1043 08/30/21 1055  SpO2:  98%  Weight: 77.1 kg (170 lb)     SBP Lying 122/76 SBP Sitting 112/84 SBP Standing 98/76   Wt Readings from Last 3 Encounters:  08/30/21 77.1 kg (170 lb)  08/02/21 77.3 kg (170 lb 6.4 oz)  05/02/21 73.9 kg (163 lb)    PHYSICAL EXAM: General:  Well appearing. No resp difficulty HEENT: normal Neck: supple. no JVD. Carotids 2+ bilat; no bruits. No lymphadenopathy or thryomegaly appreciated. Cor: PMI nondisplaced. Regular rate & rhythm. No rubs, gallops or murmurs. Lungs: clear Abdomen: soft, nontender, nondistended. No hepatosplenomegaly. No bruits or masses. Good bowel sounds. Extremities: no cyanosis, clubbing, rash, edema Neuro: alert & orientedx3, cranial nerves grossly intact. moves all 4 extremities w/o difficulty. Affect pleasant  ASSESSMENT & PLAN: 1. 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  Echo in 9/22 with EF 35-40%. LHC showed single vessel CAD involving mid left circumflex. Cardiomyopathy seemed out of proportion to CAD.  Echo in 1/23 was a difficult study, EF estimated to be around 40%. Cardiac MRI was done in 2/23, showing  EF 47% with lateral hypokinesis, RV EF 51%, small MI mid anterolateral wall.   NYHA I. Volume  status looks ok.  - Continue entresto at lower dose.  Intolerant higher dose due to dizziness. No longer dizzy will continue at the lowest dose.  - Continue Coreg 12.5 mg bid.  - Continue farxiga 10 mg daily.  - Continue spironolactone 25 mg daily. Switch to night time.  2.  Hypotension: - SBP drop when standing. As above switch spiro to bedtime.  3. . CAD: NSTEMI 9/22, DES to pLCx.  No chest pain.  - Continue ASA 81 daily and ticagrelor 90 bid.  - Continue atorvastatin and repatha, good lipids in 1/23.  4.  CKD: Stage 2.  BMET today.  5. Depression/anxiety: Continue sertraline 25 mg daily.  6.. OSA: Using CPAP.  7. RUL opacity Had CTA 9.2022 with recommendations to repeat CT in 6-12 months. Set up CT today.  Follow up in 3 months with Dr Aundra Dubin.    Armonie Staten NP-C 08/30/2021

## 2021-08-30 ENCOUNTER — Encounter (HOSPITAL_COMMUNITY): Payer: Self-pay

## 2021-08-30 ENCOUNTER — Ambulatory Visit (HOSPITAL_COMMUNITY)
Admission: RE | Admit: 2021-08-30 | Discharge: 2021-08-30 | Disposition: A | Discharge status: Home / Self Care | Payer: Medicare Other | Source: Ambulatory Visit | Attending: Adult Health | Admitting: Adult Health

## 2021-08-30 VITALS — Wt 170.0 lb

## 2021-08-30 DIAGNOSIS — I252 Old myocardial infarction: Secondary | ICD-10-CM | POA: Diagnosis not present

## 2021-08-30 DIAGNOSIS — I951 Orthostatic hypotension: Secondary | ICD-10-CM

## 2021-08-30 DIAGNOSIS — F909 Attention-deficit hyperactivity disorder, unspecified type: Secondary | ICD-10-CM | POA: Diagnosis not present

## 2021-08-30 DIAGNOSIS — Z7902 Long term (current) use of antithrombotics/antiplatelets: Secondary | ICD-10-CM | POA: Diagnosis not present

## 2021-08-30 DIAGNOSIS — I2510 Atherosclerotic heart disease of native coronary artery without angina pectoris: Secondary | ICD-10-CM | POA: Diagnosis not present

## 2021-08-30 DIAGNOSIS — Z79899 Other long term (current) drug therapy: Secondary | ICD-10-CM | POA: Diagnosis not present

## 2021-08-30 DIAGNOSIS — G4733 Obstructive sleep apnea (adult) (pediatric): Secondary | ICD-10-CM | POA: Diagnosis not present

## 2021-08-30 DIAGNOSIS — I959 Hypotension, unspecified: Secondary | ICD-10-CM | POA: Diagnosis not present

## 2021-08-30 DIAGNOSIS — F32A Depression, unspecified: Secondary | ICD-10-CM | POA: Diagnosis not present

## 2021-08-30 DIAGNOSIS — R42 Dizziness and giddiness: Secondary | ICD-10-CM | POA: Diagnosis not present

## 2021-08-30 DIAGNOSIS — N182 Chronic kidney disease, stage 2 (mild): Secondary | ICD-10-CM | POA: Insufficient documentation

## 2021-08-30 DIAGNOSIS — R918 Other nonspecific abnormal finding of lung field: Secondary | ICD-10-CM

## 2021-08-30 DIAGNOSIS — I130 Hypertensive heart and chronic kidney disease with heart failure and stage 1 through stage 4 chronic kidney disease, or unspecified chronic kidney disease: Secondary | ICD-10-CM | POA: Insufficient documentation

## 2021-08-30 DIAGNOSIS — F419 Anxiety disorder, unspecified: Secondary | ICD-10-CM | POA: Diagnosis not present

## 2021-08-30 DIAGNOSIS — I5022 Chronic systolic (congestive) heart failure: Secondary | ICD-10-CM

## 2021-08-30 DIAGNOSIS — Z7982 Long term (current) use of aspirin: Secondary | ICD-10-CM | POA: Diagnosis not present

## 2021-08-30 MED ORDER — ENTRESTO 24-26 MG PO TABS: 1.0000 | ORAL_TABLET | Freq: Two times a day (BID) | ORAL | 11 refills | Status: DC

## 2021-08-30 MED ORDER — SPIRONOLACTONE 25 MG PO TABS: 25.0000 mg | ORAL_TABLET | Freq: Every day | ORAL | 11 refills | Status: DC

## 2021-08-30 NOTE — Patient Instructions (Signed)
No Labs done today.   CHANGE Spironolactone to 1 tablet by mouth daily at bedtime.   No medication changes were made. Please continue all current medications as prescribed.  Non-Cardiac CT scanning, (CAT scanning), is a noninvasive, special x-ray that produces cross-sectional images of the body using x-rays and a computer. CT scans help physicians diagnose and treat medical conditions. For some CT exams, a contrast material is used to enhance visibility in the area of the body being studied. CT scans provide greater clarity and reveal more details than regular x-ray exams. This has to be approved through your insurance company prior to scheduling, once approved we will contact you to schedule an appointment.  Your physician recommends that you schedule a follow-up appointment in: 3 months with Dr. Aundra Dubin. Please contact our office in September to schedule a October appointment.   If you have any questions or concerns before your next appointment please send [place] a message through [place] or call our office at [phone].    TO LEAVE A MESSAGE FOR THE NURSE SELECT OPTION 2, PLEASE LEAVE A MESSAGE INCLUDING: YOUR NAME DATE OF BIRTH CALL BACK NUMBER REASON FOR CALL**this is important as we prioritize the call backs  YOU WILL RECEIVE A CALL BACK THE SAME DAY AS LONG AS YOU CALL BEFORE 4:00 PM   Do the following things EVERYDAY: Weigh yourself in the morning before breakfast. Write it down and keep it in a log. Take your medicines as prescribed Eat low salt foods--Limit salt (sodium) to 2000 mg per day.  Stay as active as you can everyday Limit all fluids for the day to less than 2 liters   At the [facility], you and your health needs are our priority. As part of our continuing mission to provide you with exceptional heart care, we have created designated Provider Care Teams. These Care Teams include your primary Cardiologist (physician) and Advanced Practice Providers  (APPs- Physician Assistants and Nurse Practitioners) who all work together to provide you with the care you need, when you need it.   You may see any of the following providers on your designated Care Team at your next follow up: Dr Glori Bickers Dr Haynes Kerns, NP Lyda Jester, [place] Audry Riles, PharmD   Please be sure to bring in all your medications bottles to every appointment.

## 2021-09-05 ENCOUNTER — Telehealth (HOSPITAL_COMMUNITY): Payer: Self-pay | Admitting: *Deleted

## 2021-09-05 NOTE — Telephone Encounter (Signed)
High res chest ct no pre cert reqd

## 2021-09-18 DIAGNOSIS — G4733 Obstructive sleep apnea (adult) (pediatric): Secondary | ICD-10-CM | POA: Diagnosis not present

## 2021-09-29 ENCOUNTER — Ambulatory Visit (HOSPITAL_COMMUNITY)
Admission: RE | Admit: 2021-09-29 | Discharge: 2021-09-29 | Disposition: A | Discharge status: Home / Self Care | Payer: Medicare Other | Source: Ambulatory Visit | Attending: Adult Health | Admitting: Adult Health

## 2021-09-29 DIAGNOSIS — R918 Other nonspecific abnormal finding of lung field: Secondary | ICD-10-CM

## 2021-09-29 DIAGNOSIS — I700 Atherosclerosis of aorta: Secondary | ICD-10-CM | POA: Diagnosis not present

## 2021-09-29 DIAGNOSIS — R590 Localized enlarged lymph nodes: Secondary | ICD-10-CM | POA: Diagnosis not present

## 2021-09-29 DIAGNOSIS — I2510 Atherosclerotic heart disease of native coronary artery without angina pectoris: Secondary | ICD-10-CM | POA: Diagnosis not present

## 2021-10-12 ENCOUNTER — Telehealth (HOSPITAL_COMMUNITY): Payer: Self-pay

## 2021-10-12 NOTE — Telephone Encounter (Addendum)
Pt aware, agreeable, and verbalized understanding   ----- Message from Conrad Mif[place], NP sent at 10/12/2021  3:33 PM EDT ----- Please call. Right upper lobe nodule no longer present. Suspected resolved inflammatory issue. No further imaging recommended.

## 2021-10-18 NOTE — Patient Instructions (Signed)
Below is our plan:  We will increase Strattera to 25mg  daily. Please continue to monitor symptoms. Let us know if you note any significant worsening.   Please make sure you are staying well hydrated. I recommend 50-60 ounces daily. Well balanced diet and regular exercise encouraged. Consistent sleep schedule with 6-8 hours recommended.   Please continue follow up with care team as directed.   Follow up with Dr Vickey Huger in 4-6 months   You may receive a survey regarding today's visit. I encourage you to leave honest feed back as I do use this information to improve patient care. Thank you for seeing me today!   Management of Memory Problems   There are some general things you can do to help manage your memory problems.  Your memory may not in fact recover, but by using techniques and strategies you will be able to manage your memory difficulties better.   1)  Establish a routine. Try to establish and then stick to a regular routine.  By doing this, you will get used to what to expect and you will reduce the need to rely on your memory.  Also, try to do things at the same time of day, such as taking your medication or checking your calendar first thing in the morning. Think about think that you can do as a part of a regular routine and make a list.  Then enter them into a daily planner to remind you.  This will help you establish a routine.   2)  Organize your environment. Organize your environment so that it is uncluttered.  Decrease visual stimulation.  Place everyday items such as keys or cell phone in the same place every day (ie.  Basket next to front door) Use post it notes with a brief message to yourself (ie. Turn off light, lock the door) Use labels to indicate where things go (ie. Which cupboards are for food, dishes, etc.) Keep a notepad and pen by the telephone to take messages   3)  Memory Aids A diary or journal/notebook/daily planner Making a list (shopping list, chore list,  to do list that needs to be done) Using an alarm as a reminder (kitchen timer or cell phone alarm) Using cell phone to store information (Notes, Calendar, Reminders) Calendar/White board placed in a prominent position Post-it notes   In order for memory aids to be useful, you need to have good habits.  It's no good remembering to make a note in your journal if you don't remember to look in it.  Try setting aside a certain time of day to look in journal.   4)  Improving mood and managing fatigue. There may be other factors that contribute to memory difficulties.  Factors, such as anxiety, depression and tiredness can affect memory. Regular gentle exercise can help improve your mood and give you more energy. Simple relaxation techniques may help relieve symptoms of anxiety Try to get back to completing activities or hobbies you enjoyed doing in the past. Learn to pace yourself through activities to decrease fatigue. Find out about some local support groups where you can share experiences with others. Try and achieve 7-8 hours of sleep at night.

## 2021-10-18 NOTE — Progress Notes (Signed)
PATIENT: Justin Carroll DOB: 04-27-1955  REASON FOR VISIT: follow up HISTORY FROM: patient  Chief Complaint  Patient presents with   Follow-up    RM 1. Last seen 04/18/21. Worsening memory when tired or does not have good sleep schedule. New: mouth tremors, wife has video. Clears throat several times a day. Has had tonsils/adenoids removed/deviated septum.    HISTORY OF PRESENT ILLNESS:  10/22/2021 ALL: Justin Carroll returns for follow up for cognitive and attention deficit. He was last seen by Dr Brett Fairy 04/2021 who started Strattera for concerns of adult ADD. Per Dr Ferne Coe neurocognitive evaluation: "results of the current neuropsychological evaluation do suggest that the greatest observed cognitive deficits relative to premorbid estimates were in areas primarily affected by attention and concentration, focus execute abilities and information processing speed".   He is tolerating Strattera. He feels that it may have helped a little with attention. He denies any cardiac symptoms. He is back to work full time. His practice has became extremely busy. He is able to participate in trials without difficulty. He is able to present information. He is able to keep files intact for cases but has difficulty with organization in the office. He has been mostly successful in his trials. His wife notes that he is not able to project his voice. Even when asked to speak loudly, he does not. His wife also notes that he constantly clears his throat. No difficulty eating or swallowing. He does feel some nasal congestion and drainage may contribute. He moves his mouth in a chewing motion or smacks lips throughout the day. He is not on any antipsychotics. She notes symptoms most everyday, all day. No change when he is on vacation or off work.   He feels mood is good. He is much more busy over the past few months He averages 8-10 new cases every week.   He continues close follow up with Dr Radford Pax for OSA on CPAP.  Entresto dose was lowered for hypotension.   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 His last 2 visits had been with Lorinda Copland, NP-  There has been a lot happening in the meantime, he was severely shocked by the death of his MD and friend  [name], and he developed a heart attack in September last year. He has an Evaluation with Dr [name]- he could not rule out a fronto temporal dementia,. Justin Carroll stated his evaluation took place while he was sleep deprived at the time, less than 6 hours each night, as he drove his son to work and had to pick him up.  Justin. Justin Carroll reports that she feels that her husband is easier distracted but these is this is reflected in little things for example he asked him to bring her a mask from the front desk and when he sat down he had forgotten it.  Multitasking has still been hard for him. After his heart attack he was seen in the heart failure clinic were Dr. Golden Hurter conducted a sleep study I - a home sleep test .  On the home sleep test showed mild apnea AHI was 11 and it was obstructive.  Oxygen nadir 87% he was started on auto CPAP 4 through 15 cmH2O no EPR as mentioned, I do not know which mask he is using.  But he is apparently feeling more rested in the morning and significant improvement in daytime  fatigue.   He is not sure how much of his concentration or cognitive ability has been affected.  We also had referred the patient to Dr. Sima Matas and Dr. Sima Matas felt that his issues were related.  A PET scan was normal.   There was just some microvascular ischemic changes on MRI and they would be even age-related normal. I felt this was not increased above peer group.   MoCA assessment in January and March 2021 was at first 26 and then 28 points, a repeat MoCA was done today at 4 out of 30 points.  Dr. Baron Sane clearly that the patient has memory difficulties and memory loss  but looking at the individual subtests and scores suggest that the greatest issue has to do with a weakened encoding capacity and difficulty to retrieve information.  In fact the patient is able to retain the vast majority of the information he had and then initially learned over a.  Of delay.  Significant differences between predicted levels of performance versus active levels of performance for seen for visual memory.  The assessment was not consistent with pattern typically shown for the most common form of dementia which is Alzheimer's disease but also there were not consistent with frontotemporal dementia.  09/14/2020 ALL:  Justin Carroll returns for follow up for memory loss and attention deficit. He continues Adderall IR '20mg'$  every morning and an additional '20mg'$  in the evenings if needed.   10/20/2019 ALL:  Justin Carroll is a 66 y.o. male here today for follow up for memory loss. He was last seen in 04/2019 with normal MOCA. Question of dementia versus ADD. He discontinued Aricept '10mg'$  per PCP direction. Adderall was added in March, 2021. He felt that extended release made him more irritable. He switched to IR dosage and feels that it worked better. He notes a significant difference in ability to focus and concentrate. He stopped taking Adderall and noted a clear worsening in attention. He feels that memory is stable. 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 He feels that with less cases he will have time to focus and, in turn, decrease stress levels.    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  Evaluation of Memory.   He is accompanied by his wife. He has not gotten lost , he has not had a fall since last visit! Still grouchy, but he is doing better with orientation, his work flow had been restructured. He has resigned form  jury trials after our first meeting. This reduced his stress level. He mentioned his stress level in court rose with COVID, the jury trials on [place].  He is sleeping well. He is reportedly a recovering alcoholic for 33 years , no medication , no drugs, he is on Wellbutrin. He is breathing better,, following a nasal septal surgery on march 4th. Recovering well. [name].    His PET scan was normal (!) and his MOCA has a normal result today , too. We may not deal with a dementia at all. 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 Neuropsychological testing is pending. For JUNE (!)- Dr. [name].  Will see if he can see the new [place] neuropsychologist.  His wife tends to attribute his last years memory decline and recovery to reducing  stress and distractions. He may have adult ADD. ADHD ? I will give a try to Ritalin.  He stays on Aricept now 10 mg. requests a 90 day supply.   CD      Dr. Anastasio Champion  added that the patient had come somewhat more irritable at times and his wife used the term "grouchy". He has not lost awareness or consciousness with any of his falls they don't seem to be syncope or seizures but they're sudden. He denies any lightheadedness or vertigo. He has not felt clumsy whilst walking and his speech has not changed he has also asked for an ENT referral as he has frequent sinus congestion. He has seen Dr Lucia Gaskins, he will do a septal deviation repair.    The family history is positive for a father with coronary artery disease, pulmonary disease/ COPD , who was heavy smoker. A brother was post mortem diagnosed with vascular dementia.    There may have been a progression slowly over the last 3 years or so of memory loss being noticeable but over the last 6 months the family has noted a significant progression. Especially with getting lost on a familiar road on a routine drive. He has a completely disorganized office, he has misfiled papers,  lost papers. His Network engineer and his wife have kept him from taking charts home.    He has impaired depth perception, has trouble parking a larger vehicle, but stated he has no trouble reading.  He reportedly has been unimpaired when it comes to accounting, but this is mostly done by his wife.   He has very good and very bad days, is usually better in the morning when it comes to cognitive performance. He has lost words, skips sometimes words, non-fluent - he makes more small talk than actual conversation.  He denies nocturnal activity, acting out dreams, but is a restless sleeper.    An MRI brain without contrast did not reveal abnormalities. [name] ordered.   Part of our exam today was a Montreal cognitive assessment , abbreviated MOCA, and Justin. Prust scored 24 out of 30 points , placing him just at the borderline between a mild cognitive impairment and a beginning dementia.  There was impairment with visual-spatial problem-solving to copy a cube was difficult he could name three animals could repeat words and a series of letters he was very good for subtraction and mathematics he remembered 3 out of 5 delay words. His orientation to place and time was completely intact.   Montreal Cognitive Assessment  04/19/2019 02/18/2019  Visuospatial/ Executive (0/5) 4 3  Naming (0/3) 3 3  Attention: Read list of digits (0/2) 2 2  Attention: Read list of letters (0/1) 1 1  Attention: Serial 7 subtraction starting at 100 (0/3) 3 3  Language: Repeat phrase (0/2) 1 2  Language : Fluency (0/1) 1 1  Abstraction (0/2) 2 2  Delayed Recall (0/5) 5 3  Orientation (0/6) 6 6  Total 28 26       REVIEW OF SYSTEMS: Out of a complete 14 system review of symptoms, the patient complains only of the following symptoms, inattention, anxiety and all other reviewed systems are negative.  ALLERGIES: No Known Allergies  HOME MEDICATIONS: Outpatient Medications Prior to Visit  Medication Sig Dispense Refill    aspirin 81 MG EC tablet Take 1 tablet (81 mg total) by mouth daily. Swallow whole. 90 tablet 3   atorvastatin (LIPITOR) 80 MG tablet TAKE ONE (1) TABLET BY MOUTH EVERY  DAY 90 tablet 3   Azelastine HCl 137 MCG/SPRAY SOLN USE 2 SPRAY(S) IN EACH NOSTRIL TWICE DAILY AS NEEDED FOR RHINITIS 30 mL 2   buPROPion (WELLBUTRIN XL) 300 MG 24 hr tablet Take 1 tablet by mouth once daily 90 tablet 0   carvedilol (COREG) 12.5 MG tablet Take 1 tablet (12.5 mg total) by mouth 2 (two) times daily with a meal. 60 tablet 11   Cholecalciferol (VITAMIN D-3 PO) Take 2,000 Units by mouth daily.     dapagliflozin propanediol (FARXIGA) 10 MG TABS tablet Take 1 tablet (10 mg total) by mouth daily. 30 tablet 11   Evolocumab (REPATHA SURECLICK) 163 MG/ML SOAJ Inject 140 mg into the skin every 14 (fourteen) days. 2 mL 12   levocetirizine (XYZAL) 5 MG tablet Take 5 mg by mouth every evening.     LORazepam (ATIVAN) 0.5 MG tablet Take 1 tablet (0.5 mg total) by mouth daily as needed for anxiety. 7 tablet 0   mometasone (NASONEX) 50 MCG/ACT nasal spray Place 1 spray into the nose daily. 17 g 5   nitroGLYCERIN (NITROSTAT) 0.4 MG SL tablet DISSOLVE ONE TABLET UNDER THE TONGUE EVERY 5 MINUTES AS NEEDED FOR CHEST PAIN.  DO NOT EXCEED A TOTAL OF 3 DOSES IN 15 MINUTES 25 tablet 3   pantoprazole (PROTONIX) 40 MG tablet Take 1 tablet by mouth once daily 90 tablet 0   sacubitril-valsartan (ENTRESTO) 24-26 MG Take 1 tablet by mouth 2 (two) times daily. 60 tablet 11   sertraline (ZOLOFT) 25 MG tablet Take 1 tablet (25 mg total) by mouth at bedtime. 30 tablet 11   spironolactone (ALDACTONE) 25 MG tablet Take 1 tablet (25 mg total) by mouth at bedtime. 30 tablet 11   tamsulosin (FLOMAX) 0.4 MG CAPS capsule TAKE 1 CAPSULE BY MOUTH ONCE DAILY AFTER SUPPER 90 capsule 0   testosterone cypionate (DEPOTESTOSTERONE CYPIONATE) 200 MG/ML injection INJECT 0.5 MLS INTO THE MUSCLE TWICE A WEEK. 10 mL 2   ticagrelor (BRILINTA) 90 MG TABS tablet Take 1 tablet  (90 mg total) by mouth 2 (two) times daily. 60 tablet 11   traZODone (DESYREL) 100 MG tablet TAKE 1 TABLET BY MOUTH AT BEDTIME 90 tablet 0   atomoxetine (STRATTERA) 18 MG capsule Take 1 capsule (18 mg total) by mouth daily. 30 capsule 5   No facility-administered medications prior to visit.    PAST MEDICAL HISTORY: Past Medical History:  Diagnosis Date   Depression    Sinus infection    yearly    PAST SURGICAL HISTORY: Past Surgical History:  Procedure Laterality Date   ADENOIDECTOMY     CORONARY STENT INTERVENTION N/A 10/18/2020   Procedure: CORONARY STENT INTERVENTION;  Surgeon: Wellington Hampshire, MD;  Location: [facility];  Service: Cardiovascular;  Laterality: N/A;   INTRAVASCULAR IMAGING/OCT N/A 10/18/2020   Procedure: INTRAVASCULAR IMAGING/OCT;  Surgeon: Wellington Hampshire, MD;  Location: [facility];  Service: Cardiovascular;  Laterality: N/A;   LEFT HEART CATH AND CORONARY ANGIOGRAPHY N/A 10/18/2020   Procedure: LEFT HEART CATH AND CORONARY ANGIOGRAPHY;  Surgeon: Wellington Hampshire, MD;  Location: [facility];  Service: Cardiovascular;  Laterality: N/A;   NASAL SEPTUM SURGERY     SINOSCOPY     TONSILLECTOMY     age 38    FAMILY HISTORY: Family History  Problem Relation Age of Onset   Allergic rhinitis Brother    Colon cancer Neg Hx    Pancreatic cancer Neg Hx  Rectal cancer Neg Hx    Stomach cancer Neg Hx     SOCIAL HISTORY: Social History   Socioeconomic History   Marital status: Married    Spouse name: Presenter, broadcasting   Number of children: 7   Years of education: Not on file   Highest education level: Professional school degree (e.g., MD, DDS, DVM, JD)  Occupational History   Occupation: Lawyer    Comment: self employeed  Tobacco Use   Smoking status: Former    Packs/day: 1.50    Years: 10.00    Total pack years: 15.00    Types: Cigarettes    Start date: 1995    Quit date: 2015    Years since quitting: 8.7   Smokeless tobacco:  Never  Vaping Use   Vaping Use: Never used  Substance and Sexual Activity   Alcohol use: No   Drug use: No   Sexual activity: Not on file  Other Topics Concern   Not on file  Social History Narrative   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   Social Determinants of Health   Financial Resource Strain: Medium Risk (10/20/2020)   Overall Financial Resource Strain (CARDIA)    Difficulty of Paying Living Expenses: Somewhat hard  Food Insecurity: No Food Insecurity (10/20/2020)   Hunger Vital Sign    Worried About Running Out of Food in the Last Year: Never true    Ran Out of Food in the Last Year: Never true  Transportation Needs: No Transportation Needs (10/20/2020)   PRAPARE - Hydrologist (Medical): No    Lack of Transportation (Non-Medical): No  Physical Activity: Not on file  Stress: Not on file  Social Connections: Not on file  Intimate Partner Violence: Not on file      PHYSICAL EXAM  Vitals:   10/22/21 1250  BP: (!) 148/96  Pulse: 61  Weight: 174 lb (78.9 kg)  Height: '5\' 7"'$  (1.702 m)     Body mass index is 27.25 kg/m.  Generalized: Well developed, in no acute distress  Cardiology: normal rate and rhythm, no murmur noted Respiratory: clear to auscultation bilaterally  Neurological examination  Mentation: Alert oriented to time, place, history taking. Follows all commands speech and language fluent Cranial nerve II-XII: Pupils were equal round reactive to light. Extraocular movements were full, visual field were full  Motor: The motor testing reveals 5 over 5 strength of all 4 extremities. Good symmetric motor tone is noted throughout. No bradykinesia, no dysthymias with toe taps or finger taps. No abnormal dystonias notes or concerns of tardive dyskinesias.  Coordination: finger to nose intact.  Gait and station: Station is normal. Gait is normal.    DIAGNOSTIC DATA (LABS, IMAGING, TESTING) - I reviewed patient records, labs, notes, testing  and imaging myself where available.      No data to display           Lab Results  Component Value Date   WBC 5.9 08/02/2021   HGB 15.6 08/02/2021   HCT 48.7 08/02/2021   MCV 97.4 08/02/2021   PLT 175 08/02/2021      Component Value Date/Time   NA 136 08/02/2021 0915   NA 140 10/30/2020 1214   K 4.3 08/02/2021 0915   CL 103 08/02/2021 0915   CO2 25 08/02/2021 0915   GLUCOSE 109 (H) 08/02/2021 0915   BUN 17 08/02/2021 0915   BUN 22 10/30/2020 1214   CREATININE 1.22 08/02/2021 0915   CREATININE 1.29 (H)  06/15/2020 0000   CALCIUM 8.9 08/02/2021 0915   PROT 6.7 12/06/2020 1221   PROT 7.1 02/18/2019 1508   ALBUMIN 3.8 12/06/2020 1221   ALBUMIN 4.7 02/18/2019 1508   AST 26 12/06/2020 1221   ALT 24 12/06/2020 1221   ALKPHOS 65 12/06/2020 1221   BILITOT 1.1 12/06/2020 1221   BILITOT 0.3 02/18/2019 1508   GFRNONAA >60 08/02/2021 0915   GFRNONAA 57 (L) 06/15/2020 0000   GFRAA 67 06/15/2020 0000   Lab Results  Component Value Date   CHOL 120 02/21/2021   HDL 58 02/21/2021   LDLCALC 45 02/21/2021   TRIG 85 02/21/2021   CHOLHDL 2.1 02/21/2021   Lab Results  Component Value Date   HGBA1C 6.3 (H) 10/18/2020   Lab Results  Component Value Date   VITAMINB12 460 01/27/2019   Lab Results  Component Value Date   TSH 0.592 10/18/2020       10/22/2021   12:55 PM 04/18/2021    1:27 PM 09/14/2020    7:55 AM 10/20/2019    1:50 PM 04/19/2019    1:27 PM  Montreal Cognitive Assessment   Visuospatial/ Executive (0/5) '2 4 4 5 4  '$ Naming (0/3) '3 3 3 3 3  '$ Attention: Read list of digits (0/2) '2 2 2 2 2  '$ Attention: Read list of letters (0/1) '1 1 1 1 1  '$ Attention: Serial 7 subtraction starting at 100 (0/3) '1 3 3 3 3  '$ Language: Repeat phrase (0/2) '2 2 1 2 1  '$ Language : Fluency (0/1) '1 1 1 1 1  '$ Abstraction (0/2) '2 2 2 2 2  '$ Delayed Recall (0/5) '5 5 5 2 5  '$ Orientation (0/6) '6 6 5 6 6  '$ Total '25 29 27 27 28  '$ Adjusted Score (based on education) 25  27       ASSESSMENT AND  PLAN 66 y.o. year old male  has a past medical history of Depression and Sinus infection. here with     ICD-10-CM   1. Cognitive attention deficit  R41.840     2. Orofacial dyskinesia  G24.4 Justin BRAIN W WO CONTRAST    3. Observed seizure-like activity (HCC)  R56.9 Justin BRAIN W WO CONTRAST    4. Motor tic disorder  F95.8 Justin BRAIN W WO CONTRAST      Justin Carroll is doing well, today. He has noted slight improvement in concentration on low dose Strattera. We will increase dose to '25mg'$  daily with plans to increase to '40mg'$  in 6-8 weeks if tolerating well. MOCA 25/30. Most difficulty with serial subtractions and reports he has never been able to draw a cube. He did have difficulty with number and hand placement on clock but was aware of error before I reviewed [place] with him. I have discussed Justin Carroll' concerns of lip smacking and abnormal mouth movements. He denies difficulty swallowing or eating. No dyskinesias or parkinsonian features noted on exam. MRI brain and PET scan of brain unremarkable in 02/2019. We will repeat imaging to evaluate for any possible intracranial etiology. May consider EEG in the future to rule out seizure activity. Consider tic versus pseudobulbar etiology. We have reviewed memory compensation strategies. Healthy lifestyle habits encouraged. He will follow up with PCP regularly. He will follow up with Dr Brett Fairy in 4-6 months.    Orders Placed This Encounter  Procedures   Justin BRAIN W WO CONTRAST    Standing Status:   Future    Standing Expiration Date:   10/31/2022  Order Specific Question:   If indicated for the ordered procedure, I authorize the administration of contrast media per Radiology protocol    Answer:   Yes    Order Specific Question:   What is the patient's sedation requirement?    Answer:   No Sedation    Order Specific Question:   Does the patient have a pacemaker or implanted devices?    Answer:   No    Order Specific Question:   Preferred imaging location?     Answer:   External      Meds ordered this encounter  Medications   atomoxetine (STRATTERA) 25 MG capsule    Sig: Take 1 capsule (25 mg total) by mouth daily.    Dispense:  90 capsule    Refill:  1    Order Specific Question:   Supervising Provider    Answer:   Melvenia Beam [[id number]]       TAV WPVXY, FNP-C 10/30/2021, 9:12 AM [facility] [address], [place], [place] [address] [phone]

## 2021-10-19 DIAGNOSIS — G4733 Obstructive sleep apnea (adult) (pediatric): Secondary | ICD-10-CM | POA: Diagnosis not present

## 2021-10-22 ENCOUNTER — Ambulatory Visit: Payer: Medicare Other | Admitting: Family Medicine

## 2021-10-22 ENCOUNTER — Encounter: Payer: Self-pay | Admitting: Family Medicine

## 2021-10-22 VITALS — BP 148/96 | HR 61 | Ht 67.0 in | Wt 174.0 lb

## 2021-10-22 DIAGNOSIS — F958 Other tic disorders: Secondary | ICD-10-CM | POA: Diagnosis not present

## 2021-10-22 DIAGNOSIS — R569 Unspecified convulsions: Secondary | ICD-10-CM

## 2021-10-22 DIAGNOSIS — R41840 Attention and concentration deficit: Secondary | ICD-10-CM

## 2021-10-22 DIAGNOSIS — G244 Idiopathic orofacial dystonia: Secondary | ICD-10-CM | POA: Diagnosis not present

## 2021-10-22 MED ORDER — ATOMOXETINE HCL 25 MG PO CAPS: 25.0000 mg | ORAL_CAPSULE | Freq: Every day | ORAL | 1 refills | Status: DC

## 2021-10-31 ENCOUNTER — Telehealth: Payer: Self-pay | Admitting: Family Medicine

## 2021-10-31 NOTE — Telephone Encounter (Signed)
[employer] medicare NPR sent to GI 

## 2021-11-07 DIAGNOSIS — G4733 Obstructive sleep apnea (adult) (pediatric): Secondary | ICD-10-CM | POA: Diagnosis not present

## 2021-11-14 ENCOUNTER — Other Ambulatory Visit: Payer: Medicare Other

## 2021-11-27 ENCOUNTER — Ambulatory Visit
Admission: RE | Admit: 2021-11-27 | Discharge: 2021-11-27 | Disposition: A | Discharge status: Home / Self Care | Payer: Medicare Other | Source: Ambulatory Visit | Attending: Family Medicine | Admitting: Family Medicine

## 2021-11-27 DIAGNOSIS — F958 Other tic disorders: Secondary | ICD-10-CM

## 2021-11-27 DIAGNOSIS — G244 Idiopathic orofacial dystonia: Secondary | ICD-10-CM

## 2021-11-27 DIAGNOSIS — R569 Unspecified convulsions: Secondary | ICD-10-CM

## 2021-11-27 MED ORDER — GADOPICLENOL 0.5 MMOL/ML IV SOLN
9.0000 mL | Freq: Once | INTRAVENOUS | Status: AC | PRN
  Administered 2021-11-27: 9 mL via INTRAVENOUS

## 2021-11-28 ENCOUNTER — Encounter: Payer: Self-pay | Admitting: Cardiovascular Disease

## 2021-12-14 ENCOUNTER — Other Ambulatory Visit (HOSPITAL_COMMUNITY): Payer: Self-pay | Admitting: *Deleted

## 2021-12-14 MED ORDER — DAPAGLIFLOZIN PROPANEDIOL 10 MG PO TABS: 10.0000 mg | ORAL_TABLET | Freq: Every day | ORAL | 11 refills | Status: DC

## 2021-12-14 MED ORDER — TICAGRELOR 90 MG PO TABS: 90.0000 mg | ORAL_TABLET | Freq: Two times a day (BID) | ORAL | 11 refills | Status: DC

## 2021-12-17 ENCOUNTER — Telehealth (HOSPITAL_COMMUNITY): Payer: Self-pay

## 2021-12-17 NOTE — Telephone Encounter (Signed)
Advanced Heart Failure Patient Advocate Encounter   Received renewal notification for [employer]). This patient is also currently taking Farxiga (AZ&ME) and is eligible for a grant that is currently open and would cover the cost of both medications.   Left voicemail for patient to call back to start application process.   Clista Bernhardt, CPhT Rx Patient Advocate Phone: [phone]

## 2021-12-24 NOTE — Telephone Encounter (Signed)
Advanced Heart Failure Patient Advocate Encounter  Spoke to patient about grant, pt is amenable to this option. He will call back with [facility] and AGI information.

## 2021-12-31 NOTE — Telephone Encounter (Signed)
Advanced Heart Failure Patient Advocate Encounter  Spoke to patient, he has been unable to confirm income information as of yet. He will call back once he is able to provide an accurate number.

## 2022-01-05 ENCOUNTER — Ambulatory Visit
Admission: EM | Admit: 2022-01-05 | Discharge: 2022-01-05 | Disposition: A | Discharge status: Home / Self Care | Payer: Medicare Other | Attending: Family Medicine | Admitting: Family Medicine

## 2022-01-05 ENCOUNTER — Encounter: Payer: Self-pay | Admitting: Emergency Medicine

## 2022-01-05 ENCOUNTER — Other Ambulatory Visit: Payer: Self-pay

## 2022-01-05 DIAGNOSIS — L239 Allergic contact dermatitis, unspecified cause: Secondary | ICD-10-CM | POA: Diagnosis not present

## 2022-01-05 MED ORDER — METHYLPREDNISOLONE SODIUM SUCC 125 MG IJ SOLR
80.0000 mg | Freq: Once | INTRAMUSCULAR | Status: AC
  Administered 2022-01-05: 80 mg via INTRAMUSCULAR

## 2022-01-05 MED ORDER — TRIAMCINOLONE ACETONIDE 0.1 % EX CREA: 1.0000 | TOPICAL_CREAM | Freq: Two times a day (BID) | CUTANEOUS | 0 refills | Status: DC

## 2022-01-05 NOTE — ED Provider Notes (Signed)
[facility]    CSN: [id number] Arrival date & time: 01/05/22  0948      History   Chief Complaint Chief Complaint  Patient presents with   Rash    HPI Justin Carroll is a 66 y.o. male.   Patient presenting today with about 5-day history of an itchy rash that initially was to the lower legs but is now widespread across most of body sparing the face.  He denies any throat itching or swelling, chest tightness, shortness of breath, nausea, vomiting associated.  Denies any new medications, foods, exposures or recent illnesses.  Takes Xyzal daily for seasonal allergies and has been using hydrocortisone cream with minimal relief.    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    Past Surgical History:  Procedure Laterality Date   ADENOIDECTOMY     CORONARY STENT INTERVENTION N/A 10/18/2020   Procedure: CORONARY STENT INTERVENTION;  Surgeon: Wellington Hampshire, MD;  Location: [place] [facility];  Service: Cardiovascular;  Laterality: N/A;   INTRAVASCULAR IMAGING/OCT N/A 10/18/2020   Procedure: INTRAVASCULAR IMAGING/OCT;  Surgeon: Wellington Hampshire, MD;  Location: [place] [facility];  Service: Cardiovascular;  Laterality: N/A;   LEFT HEART CATH AND CORONARY ANGIOGRAPHY N/A 10/18/2020   Procedure: LEFT HEART CATH AND CORONARY ANGIOGRAPHY;  Surgeon: Wellington Hampshire, MD;  Location: [place] [facility];  Service: Cardiovascular;  Laterality: N/A;    NASAL SEPTUM SURGERY     SINOSCOPY     TONSILLECTOMY     age 62       Home Medications    Prior to Admission medications   Medication Sig Start Date End Date Taking? Authorizing Provider  triamcinolone cream (KENALOG) 0.1 % Apply 1 Application topically 2 (two) times daily. 01/05/22  Yes Volney American, PA-C  aspirin 81 MG EC tablet Take 1 tablet (81 mg total) by mouth daily. Swallow whole. 10/20/20   Donato Heinz, MD  atomoxetine (STRATTERA) 25 MG capsule Take 1 capsule (25 mg total) by mouth daily. 10/22/21   Lomax, Amy, NP  atorvastatin (LIPITOR) 80 MG tablet TAKE ONE (1) TABLET BY MOUTH EVERY DAY 08/22/21   Sueanne Margarita, MD  Azelastine HCl 137 MCG/SPRAY SOLN USE 2 SPRAY(S) IN EACH NOSTRIL TWICE DAILY AS NEEDED FOR RHINITIS 09/21/20   Valentina Shaggy, MD  buPROPion (WELLBUTRIN XL) 300 MG 24 hr tablet Take 1 tablet by mouth once daily 08/21/20   Hurshel Party C, MD  carvedilol (COREG) 12.5 MG tablet Take 1 tablet (12.5 mg total) by mouth 2 (two) times daily with a meal. 01/02/21   Larey Dresser, MD  Cholecalciferol (VITAMIN D-3 PO) Take 2,000 Units by mouth daily.    [provider]  dapagliflozin propanediol (FARXIGA) 10 MG TABS tablet Take 1 tablet (10 mg total) by mouth daily. 12/14/21   Clegg, Amy D, NP  Evolocumab (REPATHA SURECLICK) 542 MG/ML SOAJ Inject 140 mg into the skin every 14 (fourteen) days. 01/16/21  Larey Dresser, MD  levocetirizine (XYZAL) 5 MG tablet Take 5 mg by mouth every evening.    [provider]  LORazepam (ATIVAN) 0.5 MG tablet Take 1 tablet (0.5 mg total) by mouth daily as needed for anxiety. 10/24/20   Clegg, Amy D, NP  mometasone (NASONEX) 50 MCG/ACT nasal spray Place 1 spray into the nose daily. 12/08/19   Valentina Shaggy, MD  nitroGLYCERIN (NITROSTAT) 0.4 MG SL tablet DISSOLVE ONE TABLET UNDER THE TONGUE EVERY 5 MINUTES AS NEEDED FOR CHEST PAIN.  DO NOT EXCEED A TOTAL OF 3 DOSES IN 15 MINUTES 01/08/21    Lorretta Harp, MD  pantoprazole (PROTONIX) 40 MG tablet Take 1 tablet by mouth once daily 08/21/20   Gosrani, Nimish C, MD  sacubitril-valsartan (ENTRESTO) 24-26 MG Take 1 tablet by mouth 2 (two) times daily. 08/30/21   Clegg, Amy D, NP  sertraline (ZOLOFT) 25 MG tablet Take 1 tablet (25 mg total) by mouth at bedtime. 01/02/21   Larey Dresser, MD  spironolactone (ALDACTONE) 25 MG tablet Take 1 tablet (25 mg total) by mouth at bedtime. 08/30/21   Clegg, Amy D, NP  tamsulosin (FLOMAX) 0.4 MG CAPS capsule TAKE 1 CAPSULE BY MOUTH ONCE DAILY AFTER SUPPER 08/21/20   Hurshel Party C, MD  testosterone cypionate (DEPOTESTOSTERONE CYPIONATE) 200 MG/ML injection INJECT 0.5 MLS INTO THE MUSCLE TWICE A WEEK. 02/28/20   Hurshel Party C, MD  ticagrelor (BRILINTA) 90 MG TABS tablet Take 1 tablet (90 mg total) by mouth 2 (two) times daily. 12/14/21   Clegg, Amy D, NP  traZODone (DESYREL) 100 MG tablet TAKE 1 TABLET BY MOUTH AT BEDTIME 08/21/20   Doree Albee, MD    Family History Family History  Problem Relation Age of Onset   Allergic rhinitis Brother    Colon cancer Neg Hx    Pancreatic cancer Neg Hx    Rectal cancer Neg Hx    Stomach cancer Neg Hx     Social History Social History   Tobacco Use   Smoking status: Former    Packs/day: 1.50    Years: 10.00    Total pack years: 15.00    Types: Cigarettes    Start date: 1995    Quit date: 2015    Years since quitting: 8.9   Smokeless tobacco: Never  Vaping Use   Vaping Use: Never used  Substance Use Topics   Alcohol use: No   Drug use: No     Allergies   Patient has no known allergies.   Review of Systems Review of Systems Per HPI  Physical Exam Triage Vital Signs ED Triage Vitals  Enc Vitals Group     BP 01/05/22 1118 99/73     Pulse Rate 01/05/22 1118 65     Resp 01/05/22 1118 20     Temp 01/05/22 1118 98.5 F (36.9 C)     Temp Source 01/05/22 1118 Oral     SpO2 01/05/22 1118 95 %     Weight --      Height --       Head Circumference --      Peak Flow --      Pain Score 01/05/22 1116 3     Pain Loc --      Pain Edu? --      Excl. in [place]? --    No data found.  Updated Vital Signs BP 99/73 (BP Location: Right Arm)   Pulse 65   Temp 98.5  F (36.9 C) (Oral)   Resp 20   SpO2 95%   Visual Acuity Right Eye Distance:   Left Eye Distance:   Bilateral Distance:    Right Eye Near:   Left Eye Near:    Bilateral Near:     Physical Exam Vitals and nursing note reviewed.  Constitutional:      Appearance: Normal appearance.  HENT:     Head: Atraumatic.     Mouth/Throat:     Mouth: Mucous membranes are moist.  Eyes:     Extraocular Movements: Extraocular movements intact.     Conjunctiva/sclera: Conjunctivae normal.  Cardiovascular:     Rate and Rhythm: Normal rate and regular rhythm.  Pulmonary:     Effort: Pulmonary effort is normal.     Breath sounds: Normal breath sounds. No wheezing.  Musculoskeletal:        General: Normal range of motion.     Cervical back: Normal range of motion and neck supple.  Skin:    General: Skin is warm and dry.     Findings: Rash present.     Comments: Erythematous maculopapular rash widespread sporadically across lower legs, upper arms, back and abdomen  Neurological:     General: No focal deficit present.     Mental Status: He is oriented to person, place, and time.  Psychiatric:        Mood and Affect: Mood normal.        Thought Content: Thought content normal.        Judgment: Judgment normal.      UC Treatments / Results  Labs (all labs ordered are listed, but only abnormal results are displayed) Labs Reviewed - No data to display  EKG   Radiology No results found.  Procedures Procedures (including critical care time)  Medications Ordered in UC Medications  methylPREDNISolone sodium succinate (SOLU-MEDROL) 125 mg/2 mL injection 80 mg (has no administration in time range)    Initial Impression / Assessment and Plan / UC  Course  I have reviewed the triage vital signs and the nursing notes.  Pertinent labs & imaging results that were available during my care of the patient were reviewed by me and considered in my medical decision making (see chart for details).     Unclear etiology, will treat with IM Solu-Medrol, antihistamine, triamcinolone cream as needed and follow-up for any worsening symptoms.  Final Clinical Impressions(s) / UC Diagnoses   Final diagnoses:  Allergic dermatitis   Discharge Instructions   None    ED Prescriptions     Medication Sig Dispense Auth. Provider   triamcinolone cream (KENALOG) 0.1 % Apply 1 Application topically 2 (two) times daily. 80 g Volney American, [place]      PDMP not reviewed this encounter.   Volney American, [place] 01/05/22 1154

## 2022-01-05 NOTE — ED Triage Notes (Signed)
Pt reports generalized rash since Monday. Pt reports is starting to become painful. Denies any new self care products or medications.

## 2022-01-10 ENCOUNTER — Other Ambulatory Visit (HOSPITAL_COMMUNITY): Payer: Self-pay

## 2022-01-10 MED ORDER — ENTRESTO 24-26 MG PO TABS: 1.0000 | ORAL_TABLET | Freq: Two times a day (BID) | ORAL | 3 refills | Status: DC

## 2022-01-10 MED ORDER — DAPAGLIFLOZIN PROPANEDIOL 10 MG PO TABS: 10.0000 mg | ORAL_TABLET | Freq: Every day | ORAL | 3 refills | Status: DC

## 2022-01-10 NOTE — Telephone Encounter (Signed)
Advanced Heart Failure Patient Advocate Encounter  The patient was approved for a [employer] grant that will help cover the cost of [name], Farxiga.  Total amount awarded, $10,000.  Effective: 12/11/21 - 12/11/22.  BIN [id number] PCN [id number] Group [id number] ID [id number]  New prescription(s) sent to [facility]. Patient provided with approval and processing information via email.  Clista Bernhardt, CPhT Rx Patient Advocate Phone: [phone]

## 2022-01-12 ENCOUNTER — Other Ambulatory Visit (HOSPITAL_COMMUNITY): Payer: Self-pay | Admitting: Cardiology

## 2022-01-18 ENCOUNTER — Other Ambulatory Visit (HOSPITAL_COMMUNITY): Payer: Self-pay

## 2022-02-18 ENCOUNTER — Encounter (HOSPITAL_COMMUNITY): Payer: Self-pay | Admitting: Cardiology

## 2022-02-18 ENCOUNTER — Ambulatory Visit (HOSPITAL_COMMUNITY)
Admission: RE | Admit: 2022-02-18 | Discharge: 2022-02-18 | Disposition: A | Discharge status: Home / Self Care | Payer: Medicare Other | Source: Ambulatory Visit | Attending: Cardiology | Admitting: Cardiology

## 2022-02-18 VITALS — BP 104/60 | HR 69 | Wt 173.2 lb

## 2022-02-18 DIAGNOSIS — G4733 Obstructive sleep apnea (adult) (pediatric): Secondary | ICD-10-CM | POA: Insufficient documentation

## 2022-02-18 DIAGNOSIS — F32A Depression, unspecified: Secondary | ICD-10-CM | POA: Insufficient documentation

## 2022-02-18 DIAGNOSIS — N182 Chronic kidney disease, stage 2 (mild): Secondary | ICD-10-CM | POA: Diagnosis not present

## 2022-02-18 DIAGNOSIS — I5022 Chronic systolic (congestive) heart failure: Secondary | ICD-10-CM | POA: Diagnosis not present

## 2022-02-18 DIAGNOSIS — Z79899 Other long term (current) drug therapy: Secondary | ICD-10-CM | POA: Insufficient documentation

## 2022-02-18 DIAGNOSIS — Z7902 Long term (current) use of antithrombotics/antiplatelets: Secondary | ICD-10-CM | POA: Diagnosis not present

## 2022-02-18 DIAGNOSIS — F909 Attention-deficit hyperactivity disorder, unspecified type: Secondary | ICD-10-CM | POA: Diagnosis not present

## 2022-02-18 DIAGNOSIS — I130 Hypertensive heart and chronic kidney disease with heart failure and stage 1 through stage 4 chronic kidney disease, or unspecified chronic kidney disease: Secondary | ICD-10-CM | POA: Insufficient documentation

## 2022-02-18 DIAGNOSIS — I2510 Atherosclerotic heart disease of native coronary artery without angina pectoris: Secondary | ICD-10-CM | POA: Diagnosis not present

## 2022-02-18 DIAGNOSIS — I252 Old myocardial infarction: Secondary | ICD-10-CM | POA: Diagnosis not present

## 2022-02-18 LAB — BASIC METABOLIC PANEL
Anion gap: 9 (ref 5–15)
BUN: 11 mg/dL (ref 8–23)
CO2: 25 mmol/L (ref 22–32)
Calcium: 8.6 mg/dL — ABNORMAL LOW (ref 8.9–10.3)
Chloride: 102 mmol/L (ref 98–111)
Creatinine, Ser: 1.12 mg/dL (ref 0.61–1.24)
GFR, Estimated: >60 mL/min (ref >60)
Glucose, Bld: 89 mg/dL (ref 70–99)
Potassium: 4.9 mmol/L (ref 3.5–5.1)
Sodium: 136 mmol/L (ref 135–145)

## 2022-02-18 LAB — LIPID PANEL
Cholesterol: 105 mg/dL (ref 0–200)
HDL: 53 mg/dL (ref >40)
LDL Cholesterol: 36 mg/dL (ref 0–99)
Total CHOL/HDL Ratio: 2 RATIO
Triglycerides: 79 mg/dL (ref <150)
VLDL: 16 mg/dL (ref 0–40)

## 2022-02-18 MED ORDER — CLOPIDOGREL BISULFATE 75 MG PO TABS: 75.0000 mg | ORAL_TABLET | Freq: Every day | ORAL | 3 refills | Status: DC

## 2022-02-18 NOTE — Progress Notes (Signed)
Primary Care: Former patient of Dr Zonia Kief [place] PA Primary Cardiologist: Dr Gwenlyn Found  Neurology: Dr Dohmeier  HF Cardiology: Dr Aundra Dubin   Justin Carroll is a 67 y.o. with a history of ADHD, depression, mild cognitive impairment vs adult ADHD, former heavy drinker (has not had alcohol in > 30 years), CAD s/p NSTEMI, OSA on CPAP and ischemic cardiomyopathy who was referred to CHF clinic from [facility].   Presented to [facility] 10/18/20 with chest pain. Had NSTEMI and underwent cath that showed severe one vessel disease mid left circumflex treated with DES. Justin Carroll was added but had AKI so stopped prior to discharge. Prior to admit he was taking 20 mg Adderall everyday with an extra 20 mg 4-5 times a week. Adderall was stopped during hospitalization.  Echo showed EF 35-40%, normal RV.   Echo was repeated in 1/23, technically difficult study with EF 40%, regional WMAs, RV mildly decreased function.   Cardiac MRI in 2/23 showed EF 47% with lateral hypokinesis, RV EF 51%, small MI mid anterolateral wall.   Today he returns for HF followup. Doing well overall.  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 He is planning on going to the gym more.  No exertional dyspnea or chest pain.  No orthopnea/PND.  No lightheadedness or palpitations.   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 He has 7 children, several were adopted. Prior smoker, quit 2015.  Prior heavy ETOH, quit in 1980s.    FH: Father and uncles with MIs in 4s.   PMH: 1. OSA: Waiting for CPAP.  2. Adult ADHD 3. Depression 4. Allergic rhinitis 5. Hyperlipidemia 6. HTN 7. CKD stage 3 8. CAD: NSTEMI 9/22 with 85% pLCs stenosis on cath, treated with DES.  9. Chronic systolic CHF: Ischemic (versus mixed ischemic/nonischemic) cardiomyopathy.   - Echo (9/22): EF 35-40%, normal RV.  - Echo (1/23): technically difficult  study with EF 40%, regional WMAs, RV mildly decreased function. - cMRI (2/23): EF 47% with lateral hypokinesis, RV EF 51%, small MI mid anterolateral wall.  Review of Systems: All systems reviewed and negative except as per HPI.   Current Outpatient Medications  Medication Sig Dispense Refill   atomoxetine (STRATTERA) 25 MG capsule Take 1 capsule (25 mg total) by mouth daily. 90 capsule 1   atorvastatin (LIPITOR) 80 MG tablet TAKE ONE (1) TABLET BY MOUTH EVERY DAY 90 tablet 3   Azelastine HCl 137 MCG/SPRAY SOLN USE 2 SPRAY(S) IN EACH NOSTRIL TWICE DAILY AS NEEDED FOR RHINITIS 30 mL 2   buPROPion (WELLBUTRIN XL) 300 MG 24 hr tablet Take 1 tablet by mouth once daily 90 tablet 0   carvedilol (COREG) 12.5 MG tablet TAKE ONE TABLET BY MOUTH TWICE A DAY WITH A MEAL 60 tablet 11   Cholecalciferol (VITAMIN D-3 PO) Take 2,000 Units by mouth daily.     clopidogrel (PLAVIX) 75 MG tablet Take 1 tablet (75 mg total) by mouth daily. 90 tablet 3   dapagliflozin propanediol (FARXIGA) 10 MG TABS tablet Take 1 tablet (10 mg total) by mouth daily. 90 tablet 3   Evolocumab (REPATHA SURECLICK) 086 MG/ML SOAJ Inject 140 mg into the skin every 14 (fourteen) days. 2 mL 12   levocetirizine (XYZAL) 5 MG tablet Take 5 mg by mouth every evening.     LORazepam (ATIVAN) 0.5 MG tablet Take 1  tablet (0.5 mg total) by mouth daily as needed for anxiety. 7 tablet 0   mometasone (NASONEX) 50 MCG/ACT nasal spray Place 1 spray into the nose daily. 17 g 5   nitroGLYCERIN (NITROSTAT) 0.4 MG SL tablet DISSOLVE ONE TABLET UNDER THE TONGUE EVERY 5 MINUTES AS NEEDED FOR CHEST PAIN.  DO NOT EXCEED A TOTAL OF 3 DOSES IN 15 MINUTES 25 tablet 3   pantoprazole (PROTONIX) 40 MG tablet Take 1 tablet by mouth once daily 90 tablet 0   sacubitril-valsartan (ENTRESTO) 24-26 MG Take 1 tablet by mouth 2 (two) times daily. 180 tablet 3   spironolactone (ALDACTONE) 25 MG tablet Take 1 tablet (25 mg total) by mouth at bedtime. 30 tablet 11   tamsulosin  (FLOMAX) 0.4 MG CAPS capsule TAKE 1 CAPSULE BY MOUTH ONCE DAILY AFTER SUPPER 90 capsule 0   testosterone cypionate (DEPOTESTOSTERONE CYPIONATE) 200 MG/ML injection INJECT 0.5 MLS INTO THE MUSCLE TWICE A WEEK. 10 mL 2   traZODone (DESYREL) 100 MG tablet TAKE 1 TABLET BY MOUTH AT BEDTIME 90 tablet 0   triamcinolone cream (KENALOG) 0.1 % Apply 1 Application topically as needed.     No current facility-administered medications for this encounter.    No Known Allergies   Vitals:   02/18/22 1404  BP: 104/60  Pulse: 69  SpO2: 94%  Weight: 78.6 kg (173 lb 3.2 oz)     Wt Readings from Last 3 Encounters:  02/18/22 78.6 kg (173 lb 3.2 oz)  10/22/21 78.9 kg (174 lb)  08/30/21 77.1 kg (170 lb)    PHYSICAL EXAM: General: NAD Neck: No JVD, no thyromegaly or thyroid nodule.  Lungs: Clear to auscultation bilaterally with normal respiratory effort. CV: Nondisplaced PMI.  Heart regular S1/S2, no S3/S4, no murmur.  No peripheral edema.  No carotid bruit.  Normal pedal pulses.  Abdomen: Soft, nontender, no hepatosplenomegaly, no distention.  Skin: Intact without lesions or rashes.  Neurologic: Alert and oriented x 3.  Psych: Normal affect. Extremities: No clubbing or cyanosis.  HEENT: Normal.   ASSESSMENT & PLAN: 1. 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  Echo in 9/22 with EF 35-40%. LHC showed single vessel CAD involving mid left circumflex. Cardiomyopathy seemed out of proportion to CAD.  Echo in 1/23 was a difficult study, EF estimated to be around 40%. Cardiac MRI was done in 2/23, showing  EF 47% with lateral hypokinesis, RV EF 51%, small MI mid anterolateral wall.  NYHA class I symptoms, he is not volume overloaded on exam.  - Continue Entresto 24/26 bid.  Intolerant higher dose due to dizziness. No longer dizzy will continue at the lowest dose.  - Continue Coreg 12.5 mg bid.  - Continue Farxiga 10 mg daily.  - Continue spironolactone 25 mg daily. BMET  today.   - I will get repeat echo at followup appt.  2. CAD: NSTEMI 9/22, DES to pLCx.  No chest pain.  - He is > 1 year post-PCI, will stop ASA and ticagrelor and start single agent clopidogrel 75 mg daily for long-term.   - Continue atorvastatin and repatha, check lipids today.  3.  CKD: Stage 2.  BMET today.  4. OSA: Using CPAP.  5. Lung nodule: Resolved on 8/23 chest CT.   Echo + followup in 4 months.   Loralie Champagne  02/18/2022

## 2022-02-18 NOTE — Patient Instructions (Signed)
STOP Asprin  START Plavix 75 mg daily.  Your physician has requested that you have an echocardiogram. Echocardiography is a painless test that uses sound waves to create images of your heart. It provides your doctor with information about the size and shape of your heart and how well your heart's chambers and valves are working. This procedure takes approximately one hour. There are no restrictions for this procedure. Please do NOT wear cologne, perfume, aftershave, or lotions (deodorant is allowed). Please arrive 15 minutes prior to your appointment time.  Your physician recommends that you schedule a follow-up appointment in: 4 months with an echocardiogram (May 2024)  ** please call the office in March to arrange your follow up appointment **  If you have any questions or concerns before your next appointment please send [place] a message through [place] or call our office at [phone].    TO LEAVE A MESSAGE FOR THE NURSE SELECT OPTION 2, PLEASE LEAVE A MESSAGE INCLUDING: YOUR NAME DATE OF BIRTH CALL BACK NUMBER REASON FOR CALL**this is important as we prioritize the call backs  YOU WILL RECEIVE A CALL BACK THE SAME DAY AS LONG AS YOU CALL BEFORE 4:00 PM  At the [facility], you and your health needs are our priority. As part of our continuing mission to provide you with exceptional heart care, we have created designated Provider Care Teams. These Care Teams include your primary Cardiologist (physician) and Advanced Practice Providers (APPs- Physician Assistants and Nurse Practitioners) who all work together to provide you with the care you need, when you need it.   You may see any of the following providers on your designated Care Team at your next follow up: Dr Glori Bickers Dr Loralie Champagne Dr. Roxana Hires, NP Lyda Jester, [place] [facility] [place], [place] Forestine Na, NP Audry Riles, PharmD   Please be sure to bring in all your  medications bottles to every appointment.

## 2022-02-22 ENCOUNTER — Other Ambulatory Visit (HOSPITAL_COMMUNITY): Payer: Self-pay

## 2022-02-26 ENCOUNTER — Encounter: Payer: Medicare Other | Admitting: Psychology

## 2022-03-19 ENCOUNTER — Ambulatory Visit: Payer: Medicare Other | Admitting: Neurology

## 2022-03-19 ENCOUNTER — Encounter: Payer: Self-pay | Admitting: Neurology

## 2022-03-19 VITALS — BP 111/75 | HR 71 | Ht 67.0 in | Wt 175.0 lb

## 2022-03-19 DIAGNOSIS — F3289 Other specified depressive episodes: Secondary | ICD-10-CM

## 2022-03-19 DIAGNOSIS — H519 Unspecified disorder of binocular movement: Secondary | ICD-10-CM

## 2022-03-19 DIAGNOSIS — R41840 Attention and concentration deficit: Secondary | ICD-10-CM | POA: Diagnosis not present

## 2022-03-19 DIAGNOSIS — R454 Irritability and anger: Secondary | ICD-10-CM

## 2022-03-19 DIAGNOSIS — F32A Depression, unspecified: Secondary | ICD-10-CM | POA: Insufficient documentation

## 2022-03-19 MED ORDER — ATOMOXETINE HCL 40 MG PO CAPS: 40.0000 mg | ORAL_CAPSULE | Freq: Every day | ORAL | 1 refills | Status: DC

## 2022-03-19 NOTE — Progress Notes (Signed)
Provider:  Larey Seat, MD  Primary Care Physician:  Ginger Organ [address] [place] [address]     Referring Provider: Cory Munch, Pa-c [address] [place],  [place] [address]          Chief Complaint according to patient   Patient presents with:     New Patient (Initial Visit)           HISTORY OF PRESENT ILLNESS:  Justin Carroll is a 67 y.o. male patient who is here for revisit 03/19/2022 for RV on cognitive attention deficit: .  Chief concern according to patient : " Ia have decided to change from criminal law to custody and guardianship law, I felt overwhelmed. I have  not been good at multitasking. I was very fatigued in 2023 while my BP was aggressively treated and ended up with hypotension. I take Strattera" . The patient has been doing well on Strattera, he has to take more time to focus on one thing from a to z and he will not need to go to court as often. He hopes these changes in his professional life shall reduce the need to multi tasking. He will reschedule a follow up testing session with dr [name].   Today , 03-19-2022,  here to see if adjustments in strattera can help further?        10/22/2021 ALL: Clair Gulling returns for follow up for cognitive and attention deficit. He was last seen by Dr Brett Fairy 04/2021 who started Strattera for concerns of adult ADD. Per Dr Ferne Coe neurocognitive evaluation: "results of the current neuropsychological evaluation do suggest that the greatest observed cognitive deficits relative to premorbid estimates were in areas primarily affected by attention and concentration, focus execute abilities and information processing speed".    He is tolerating Strattera. He feels that it may have helped a little with attention. He denies any cardiac symptoms. He is back to work full time. His practice has became extremely busy. He is able to participate in trials without difficulty. He is able to present  information. He is able to keep files intact for cases but has difficulty with organization in the office. He has been mostly successful in his trials. His wife notes that he is not able to project his voice. Even when asked to speak loudly, he does not. His wife also notes that he constantly clears his throat. No difficulty eating or swallowing. He does feel some nasal congestion and drainage may contribute. He moves his mouth in a chewing motion or smacks lips throughout the day. He is not on any antipsychotics. She notes symptoms most everyday, all day. No change when he is on vacation or off work.    He feels mood is good. He is much more busy over the past few months He averages 8-10 new cases every week.    He continues close follow up with Dr Radford Pax for OSA on CPAP. Entresto dose was lowered for hypotension.    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 His last 2 visits had been with [name], NP-  There has been a lot happening in the meantime, he was severely shocked by the death of his MD and friend [name], and he developed a heart attack in September last year. He has an evaluation with Dr [name]- he could not rule out a fronto -temporal  dementia,. Mr. Clemmens stated his evaluation took place while he was very sleep deprived at the time, less than 6 hours each night, as he drove his son to work and had to pick him up.  [name] reports that she feels that her husband is easier distracted but these is this is reflected in little things;  for example he asked him to bring her a mask from the front desk and when he sat down he had forgotten it.  Multitasking has still been hard for him. After his heart attack he was seen in the heart failure clinic were Dr. Golden Hurter conducted a sleep study I - a home sleep test . On the home sleep test showed mild apnea AHI was 11 and it was obstructive.  Oxygen nadir 87% he was started on auto  CPAP 4 through 15 cmH2O , no EPR as mentioned, I do not know which mask he is using.  But he is apparently feeling more rested in the morning and significant improvement in daytime fatigue.   He is not sure how much of his concentration or cognitive ability has been affected.  We also had referred the patient to Dr. Sima Matas and Dr. Sima Matas felt that his issues were related.  A PET scan was normal.   There was just some microvascular ischemic changes on MRI and they would be even age-related normal. I felt this was not increased above peer group.   MoCA assessment in January and March 2021 was at first 26 and then 28 points, a repeat MoCA was done today at 19 out of 30 points.   Dr. Sima Matas clearly stated that the patient has memory difficulties and memory loss but looking at the individual subtests and scores suggest that the greatest issue has to do with a weakened encoding capacity and difficulty to retrieve information.  In fact the patient is able to retain the vast majority of the information he had and then initially learned over period of delay.  Significant differences between predicted levels of performance versus active levels of performance for seen for visual memory.  The assessment was not consistent with pattern typically shown for the most common form of dementia which is Alzheimer's disease but also there were not consistent with frontotemporal dementia.  Review of Systems: Out of a complete 14 system review, the patient complains of only the following symptoms, and all other reviewed systems are negative.:  Fatigue, sleepiness, agitation, iritability , ADD.    How likely are you to doze in the following situations: 0 = not likely, 1 = slight chance, 2 = moderate chance, 3 = high chance   Sitting and Reading? Watching Television? Sitting inactive in a public place (theater or meeting)? As a passenger in a car for an hour without a break? Lying down in the afternoon when  circumstances permit? Sitting and talking to someone? Sitting quietly after lunch without alcohol? In a car, while stopped for a few minutes in traffic?   Total = X/ 24 points   FSS endorsed at X/ 63 points.   GDS : 3/ 15 points      03/19/2022    1:15 PM 10/22/2021   12:55 PM 04/18/2021    1:27 PM 09/14/2020    7:55 AM 10/20/2019    1:50 PM  Montreal Cognitive Assessment   Visuospatial/ Executive (0/5) 4 2 4 4 5  $ Naming (0/3) 3 3 3 3 3  $ Attention: Read list of digits (0/2) 2 2 2 $ 2  2  Attention: Read list of letters (0/1) 1 1 1 1 1  $ Attention: Serial 7 subtraction starting at 100 (0/3) 3 1 3 3 3  $ Language: Repeat phrase (0/2) 2 2 2 1 2  $ Language : Fluency (0/1) 1 1 1 1 1  $ Abstraction (0/2) 2 2 2 2 2  $ Delayed Recall (0/5) 5 5 5 5 2  $ Orientation (0/6) 6 6 6 5 6  $ Total 29 25 29 27 27  $ Adjusted Score (based on education)  25  27      Social History   Socioeconomic History   Marital status: Married    Spouse name: Presenter, broadcasting   Number of children: 7   Years of education: Not on file   Highest education level: Professional school degree (e.g., MD, DDS, DVM, JD)  Occupational History   Occupation: Lawyer    Comment: self employeed  Tobacco Use   Smoking status: Former    Packs/day: 1.50    Years: 10.00    Total pack years: 15.00    Types: Cigarettes    Start date: 1995    Quit date: 2015    Years since quitting: 9.1   Smokeless tobacco: Never  Vaping Use   Vaping Use: Never used  Substance and Sexual Activity   Alcohol use: No   Drug use: No   Sexual activity: Not on file  Other Topics Concern   Not on file  Social History Narrative   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   Social Determinants of Health   Financial Resource Strain: Medium Risk (10/20/2020)   Overall Financial Resource Strain (CARDIA)    Difficulty of Paying Living Expenses: Somewhat hard  Food Insecurity: No Food Insecurity (10/20/2020)   Hunger Vital Sign    Worried About Running Out of Food in the  Last Year: Never true    Ran Out of Food in the Last Year: Never true  Transportation Needs: No Transportation Needs (10/20/2020)   PRAPARE - Hydrologist (Medical): No    Lack of Transportation (Non-Medical): No  Physical Activity: Not on file  Stress: Not on file  Social Connections: Not on file    Family History  Problem Relation Age of Onset   Allergic rhinitis Brother    Colon cancer Neg Hx    Pancreatic cancer Neg Hx    Rectal cancer Neg Hx    Stomach cancer Neg Hx     Past Medical History:  Diagnosis Date   Depression    Sinus infection    yearly    Past Surgical History:  Procedure Laterality Date   ADENOIDECTOMY     CORONARY STENT INTERVENTION N/A 10/18/2020   Procedure: CORONARY STENT INTERVENTION;  Surgeon: Wellington Hampshire, MD;  Location: [facility];  Service: Cardiovascular;  Laterality: N/A;   INTRAVASCULAR IMAGING/OCT N/A 10/18/2020   Procedure: INTRAVASCULAR IMAGING/OCT;  Surgeon: Wellington Hampshire, MD;  Location: [facility];  Service: Cardiovascular;  Laterality: N/A;   LEFT HEART CATH AND CORONARY ANGIOGRAPHY N/A 10/18/2020   Procedure: LEFT HEART CATH AND CORONARY ANGIOGRAPHY;  Surgeon: Wellington Hampshire, MD;  Location: [facility];  Service: Cardiovascular;  Laterality: N/A;   NASAL SEPTUM SURGERY     SINOSCOPY     TONSILLECTOMY     age 28     Current Outpatient Medications on File Prior to Visit  Medication Sig Dispense Refill   atomoxetine (STRATTERA) 25 MG capsule Take 1 capsule (25  mg total) by mouth daily. 90 capsule 1   atorvastatin (LIPITOR) 80 MG tablet TAKE ONE (1) TABLET BY MOUTH EVERY DAY 90 tablet 3   Azelastine HCl 137 MCG/SPRAY SOLN USE 2 SPRAY(S) IN EACH NOSTRIL TWICE DAILY AS NEEDED FOR RHINITIS 30 mL 2   buPROPion (WELLBUTRIN XL) 300 MG 24 hr tablet Take 1 tablet by mouth once daily 90 tablet 0   carvedilol (COREG) 12.5 MG tablet TAKE ONE TABLET BY MOUTH TWICE A DAY WITH A MEAL 60  tablet 11   Cholecalciferol (VITAMIN D-3 PO) Take 2,000 Units by mouth daily.     clopidogrel (PLAVIX) 75 MG tablet Take 1 tablet (75 mg total) by mouth daily. 90 tablet 3   dapagliflozin propanediol (FARXIGA) 10 MG TABS tablet Take 1 tablet (10 mg total) by mouth daily. 90 tablet 3   Evolocumab (REPATHA SURECLICK) [id number] MG/ML SOAJ Inject 140 mg into the skin every 14 (fourteen) days. 2 mL 12   levocetirizine (XYZAL) 5 MG tablet Take 5 mg by mouth every evening.     LORazepam (ATIVAN) 0.5 MG tablet Take 1 tablet (0.5 mg total) by mouth daily as needed for anxiety. 7 tablet 0   mometasone (NASONEX) 50 MCG/ACT nasal spray Place 1 spray into the nose daily. 17 g 5   nitroGLYCERIN (NITROSTAT) 0.4 MG SL tablet DISSOLVE ONE TABLET UNDER THE TONGUE EVERY 5 MINUTES AS NEEDED FOR CHEST PAIN.  DO NOT EXCEED A TOTAL OF 3 DOSES IN 15 MINUTES 25 tablet 3   pantoprazole (PROTONIX) 40 MG tablet Take 1 tablet by mouth once daily 90 tablet 0   sacubitril-valsartan (ENTRESTO) 24-26 MG Take 1 tablet by mouth 2 (two) times daily. 180 tablet 3   spironolactone (ALDACTONE) 25 MG tablet Take 1 tablet (25 mg total) by mouth at bedtime. 30 tablet 11   tamsulosin (FLOMAX) 0.4 MG CAPS capsule TAKE 1 CAPSULE BY MOUTH ONCE DAILY AFTER SUPPER 90 capsule 0   testosterone cypionate (DEPOTESTOSTERONE CYPIONATE) 200 MG/ML injection INJECT 0.5 MLS INTO THE MUSCLE TWICE A WEEK. 10 mL 2   traZODone (DESYREL) 100 MG tablet TAKE 1 TABLET BY MOUTH AT BEDTIME 90 tablet 0   triamcinolone cream (KENALOG) 0.1 % Apply 1 Application topically as needed.     No current facility-administered medications on file prior to visit.    No Known Allergies   DIAGNOSTIC DATA (LABS, IMAGING, TESTING) - I reviewed patient records, labs, notes, testing and imaging myself where available.  Lab Results  Component Value Date   WBC 5.9 08/02/2021   HGB 15.6 08/02/2021   HCT 48.7 08/02/2021   MCV 97.4 08/02/2021   PLT 175 08/02/2021      Component  Value Date/Time   NA 136 02/18/2022 1446   NA 140 10/30/2020 1214   K 4.9 02/18/2022 1446   CL 102 02/18/2022 1446   CO2 25 02/18/2022 1446   GLUCOSE 89 02/18/2022 1446   BUN 11 02/18/2022 1446   BUN 22 10/30/2020 1214   CREATININE 1.12 02/18/2022 1446   CREATININE 1.29 (H) 06/15/2020 0000   CALCIUM 8.6 (L) 02/18/2022 1446   PROT 6.7 12/06/2020 1221   PROT 7.1 02/18/2019 1508   ALBUMIN 3.8 12/06/2020 1221   ALBUMIN 4.7 02/18/2019 1508   AST 26 12/06/2020 1221   ALT 24 12/06/2020 1221   ALKPHOS 65 12/06/2020 1221   BILITOT 1.1 12/06/2020 1221   BILITOT 0.3 02/18/2019 1508   GFRNONAA >60 02/18/2022 1446   GFRNONAA 57 (L) 06/15/2020 0000  GFRAA 67 06/15/2020 0000   Lab Results  Component Value Date   CHOL 105 02/18/2022   HDL 53 02/18/2022   LDLCALC 36 02/18/2022   TRIG 79 02/18/2022   CHOLHDL 2.0 02/18/2022   Lab Results  Component Value Date   HGBA1C 6.3 (H) 10/18/2020   Lab Results  Component Value Date   VITAMINB12 460 01/27/2019   Lab Results  Component Value Date   TSH 0.592 10/18/2020    PHYSICAL EXAM:  Today's Vitals   03/19/22 1306  BP: 111/75  Pulse: 71  Weight: 175 lb (79.4 kg)  Height: 5' 7"$  (1.702 m)   Body mass index is 27.41 kg/m.   Wt Readings from Last 3 Encounters:  03/19/22 175 lb (79.4 kg)  02/18/22 173 lb 3.2 oz (78.6 kg)  10/22/21 174 lb (78.9 kg)     Ht Readings from Last 3 Encounters:  03/19/22 5' 7"$  (1.702 m)  10/22/21 5' 7"$  (1.702 m)  04/18/21 5' 7"$  (1.702 m)      General: The patient is awake, alert and appears not in acute distress. The patient is well groomed. Head: Normocephalic, atraumatic. Neck is supple. Cardiovascular:  Regular rate and cardiac rhythm by pulse,  without distended neck veins. Respiratory: Lungs are clear to auscultation.  Skin:  Without evidence of ankle edema, or rash. Trunk: The patient's posture is erect.   NEUROLOGIC EXAM: The patient is awake and alert, oriented to place and time.    Memory subjective described as intact.     03/19/2022    1:15 PM 10/22/2021   12:55 PM 04/18/2021    1:27 PM 09/14/2020    7:55 AM 10/20/2019    1:50 PM  Montreal Cognitive Assessment   Visuospatial/ Executive (0/5) 4 2 4 4 5  $ Naming (0/3) 3 3 3 3 3  $ Attention: Read list of digits (0/2) 2 2 2 2 2  $ Attention: Read list of letters (0/1) 1 1 1 1 1  $ Attention: Serial 7 subtraction starting at 100 (0/3) 3 1 3 3 3  $ Language: Repeat phrase (0/2) 2 2 2 1 2  $ Language : Fluency (0/1) 1 1 1 1 1  $ Abstraction (0/2) 2 2 2 2 2  $ Delayed Recall (0/5) 5 5 5 5 2  $ Orientation (0/6) 6 6 6 5 6  $ Total 29 25 29 27 27  $ Adjusted Score (based on education)  25  27      Attention span & concentration ability appears restricted.  Speech is fluent,  without  dysarthria, dysphonia or aphasia.  Mood and affect are appropriate.   Cranial nerves: no loss of smell or taste reported  Pupils are equal and briskly reactive to light. Funduscopic exam deferred.  Extraocular movements in vertical  plane were intact and he did not follow horizontal plane movements- ??? without nystagmus.  No Diplopia. Visual fields by finger perimetry are intact. Hearing was impaired  to soft voice - right more than left . Facial sensation intact to fine touch.  Facial motor strength is symmetric and tongue and uvula move midline. He moves orofacial muscles constantly.  Neck ROM : rotation, tilt and flexion extension were normal for age and shoulder shrug was symmetrical.    Motor exam:  Symmetric bulk, tone and ROM.    bilateral biceps cog- wheeling,  symmetric grip strength .   Sensory:  Fine touch, pinprick and vibration were tested  and  normal.  Proprioception tested in the upper extremities was normal.   Coordination: Rapid  alternating movements in the fingers/hands were of normal speed.  The Finger-to-nose maneuver was intact without evidence of ataxia, dysmetria or tremor.   Gait and station: Patient could rise unassisted  from a seated position, walked without assistive device.  Stance is of normal width/ base and the patient turned with 3 steps.  Toe and heel walk were deferred.  Deep tendon reflexes: in the  upper and lower extremities are symmetric and intact.  Babinski response was deferred.    ASSESSMENT AND PLAN 67 y.o. year old male  here with:    1) attention deficit , no cognitive impairment by [facility]. He feels that Strattera helps and he is on a low dose, likes to try an increase. He will reschedule a neuro-psychological evaluation with Dr. [name] to have a follow up test - his last eval was in 2021? .   2) orofacial dyskinesia or tic, smacking sounds.   3) hearing deficit , tinnitus. Scheduled for hearing test      I plan to follow up either personally or through our NP within 6 months.   I would like to thank Cory Munch, [address] [place],  [place] [address] for allowing me to meet with and to take care of this pleasant patient.   CC: I will share my notes with PCP and hope we have a follow- up on Dr Victorino Sparrow test.  After spending a total time of  25  minutes face to face and additional time for physical and neurologic examination, review of laboratory studies,  personal review of imaging studies, reports and results of other testing and review of referral information / records as far as provided in visit,   Electronically signed by: Larey Seat, MD 03/19/2022 1:46 PM  [facility] Board certified by The [employer] of Sleep Medicine and Diplomate of the [employer] of Sleep Medicine. Board certified In Neurology through the [employer], Fellow of the [employer] of Neurology. Medical Director of [facility].

## 2022-03-19 NOTE — Patient Instructions (Signed)
Atomoxetine Capsules What is this medication? ATOMOXETINE (AT oh mox e teen) treats attention-deficit hyperactivity disorder (ADHD). It works by improving focus and reducing impulsive behavior. It belongs to a group of medications called SNRIs. This medicine may be used for other purposes; ask your health care provider or pharmacist if you have questions. COMMON BRAND NAME(S): Strattera What should I tell my care team before I take this medication? They need to know if you have any of these conditions: Glaucoma High or low blood pressure History of stroke Irregular heartbeat or other cardiac disease Liver disease Mania or bipolar disorder Pheochromocytoma Suicidal thoughts, plans, or attempt by you or a family member An unusual or allergic reaction to atomoxetine, other medications, foods, dyes, or preservatives Pregnant or trying to get pregnant Breastfeeding How should I use this medication? Take this medication by mouth with water. Take it as directed on the prescription label at the same time every day. Do not cut, crush, or chew this medication. Swallow the capsules whole. You can take it with or without food. If it upsets your stomach, take it with food. If you have difficulty sleeping, and you take more than 1 dose per day, take your last dose before 6 PM. Keep taking it unless your care team tells you to stop. A special MedGuide will be given to you by the pharmacist with each prescription and refill. Be sure to read this information carefully each time. Talk to your care team about the use of this medication in children. While it may be prescribed for children as young as 6 years for selected conditions, precautions do apply. Overdosage: If you think you have taken too much of this medicine contact a poison control center or emergency room at once. NOTE: This medicine is only for you. Do not share this medicine with others. What if I miss a dose? If you miss a dose, take it as soon  as you can. If it is almost time for your next dose, take only that dose. Do not take double or extra doses. What may interact with this medication? Do not take this medication with any of the following: Cisapride Dronedarone MAOIs, such as Carbex, Eldepryl, Marplan, Nardil, and Parnate Pimozide Reboxetine Thioridazine This medication may also interact with the following: Certain medications for blood pressure, heart disease, irregular heart beat Certain medications for lung disease, such as albuterol Certain medications for mental heath conditions Cold or allergy medications Dofetilide Fluoxetine Medications that increase blood pressure, such as dopamine, dobutamine, or ephedrine Other medications that cause heart rhythm changes Paroxetine Quinidine Stimulant medications for ADHD, weight loss, or staying awake Ziprasidone This list may not describe all possible interactions. Give your health care provider a list of all the medicines, herbs, non-prescription drugs, or dietary supplements you use. Also tell them if you smoke, drink alcohol, or use illegal drugs. Some items may interact with your medicine. What should I watch for while using this medication? Visit your care team for regular checks on your progress. It may take a week or more before you see the benefit from this medication. This is why it is very important to continue taking the medication and not miss any doses. If you have been taking this medication regularly for some time, do not suddenly stop taking it. Ask your care team for advice. Rarely, this medication may increase thoughts of suicide or suicide attempts in children and teenagers. Call your child's care team right away if your child or teenager has new  or increased thoughts of suicide or has changes in mood or behavior like becoming irritable or anxious. Regularly monitor your child for these behavioral changes. Contact you care team right away if you have an  erection that lasts longer than 4 hours or if it becomes painful. This may be a sign of serious problem and must be treated right away to prevent permanent damage. This medication may affect your coordination, reaction time, or judgment. Do not drive or operate machinery until you know how this medication affects you. Sit up or stand slowly to reduce the risk of dizzy or fainting spells. Drinking alcohol with this medication can increase the risk of these side effects. Do not treat yourself for coughs, colds, or allergies without asking your care team for advice. Some ingredients can increase possible side effects. Your mouth may get dry. Chewing sugarless gum or sucking hard candy and drinking plenty of water will help. What side effects may I notice from receiving this medication? Side effects that you should report to your care team as soon as possible: Allergic reactions--skin rash, itching, hives, swelling of the face, lips, tongue, or throat Heart rhythm changes--fast or irregular heartbeat, dizziness, feeling faint or lightheaded, chest pain, trouble breathing Increase in blood pressure Liver injury-- right upper belly pain, loss of appetite, nausea, light-colored stool, dark yellow or brown urine, yellowing skin or eyes, unusual weakness or fatigue Mood and behavior changes--anxiety, nervousness, confusion, hallucinations, irritability, hostility, thoughts of suicide or self-harm, worsening mood, feelings of depression Painful or prolonged erection Stroke in adults--sudden numbness or weakness of the face, arm, or leg, trouble speaking, confusion, trouble walking, loss of balance or coordination, dizziness, severe headache, change in vision Trouble passing urine Side effects that usually do not require medical attention (report to your care team if they continue or are bothersome): Change in sex drive or performance Constipation Dizziness Dry mouth Loss of appetite Nausea Stomach  pain Vomiting This list may not describe all possible side effects. Call your doctor for medical advice about side effects. You may report side effects to FDA at 1-800-FDA-1088. Where should I keep my medication? Keep out of the reach of children and pets. Store at room temperature between 15 and 30 degrees C (59 and 86 degrees F). Throw away any unused medication after the expiration date. NOTE: This sheet is a summary. It may not cover all possible information. If you have questions about this medicine, talk to your doctor, pharmacist, or health care provider.  2023 Elsevier/Gold Standard (2020-02-24 00:00:00)

## 2022-03-24 ENCOUNTER — Encounter: Payer: Self-pay | Admitting: Cardiovascular Disease

## 2022-03-25 NOTE — Telephone Encounter (Signed)
Left message for pts wife to call back. 

## 2022-04-02 ENCOUNTER — Encounter: Payer: Self-pay | Admitting: Cardiovascular Disease

## 2022-04-02 ENCOUNTER — Ambulatory Visit: Payer: Medicare Other | Attending: Cardiovascular Disease | Admitting: Cardiovascular Disease

## 2022-04-02 VITALS — BP 98/86 | HR 86 | Ht 67.0 in | Wt 172.0 lb

## 2022-04-02 DIAGNOSIS — I519 Heart disease, unspecified: Secondary | ICD-10-CM

## 2022-04-02 DIAGNOSIS — I214 Non-ST elevation (NSTEMI) myocardial infarction: Secondary | ICD-10-CM | POA: Diagnosis not present

## 2022-04-02 DIAGNOSIS — E782 Mixed hyperlipidemia: Secondary | ICD-10-CM | POA: Diagnosis not present

## 2022-04-02 NOTE — Patient Instructions (Signed)
Medication Instructions:  Your physician recommends that you continue on your current medications as directed. Please refer to the Current Medication list given to you today.  *If you need a refill on your cardiac medications before your next appointment, please call your pharmacy*   Follow-Up: At [facility], you and your health needs are our priority.  As part of our continuing mission to provide you with exceptional heart care, we have created designated Provider Care Teams.  These Care Teams include your primary Cardiologist (physician) and Advanced Practice Providers (APPs -  Physician Assistants and Nurse Practitioners) who all work together to provide you with the care you need, when you need it.  We recommend signing up for the patient portal called "MyChart".  Sign up information is provided on this After Visit Summary.  MyChart is used to connect with patients for Virtual Visits (Telemedicine).  Patients are able to view lab/test results, encounter notes, upcoming appointments, etc.  Non-urgent messages can be sent to your provider as well.   To learn more about what you can do with MyChart, go to [email or web address].    Your next appointment:   6 month(s)  Provider:   Quay Burow, MD

## 2022-04-02 NOTE — Assessment & Plan Note (Signed)
History of CAD status post non-STEMI 10/18/2020 with cardiac catheterization performed by Dr. Fletcher Anon revealing a 85% mid AV groove circumflex which was stented with a Medtronic Onyx frontier 3.5 mm x 15 mm long drug-eluting stent.  He denies chest pain or shortness of breath.  He remains on dual antiplatelet therapy.

## 2022-04-02 NOTE — Assessment & Plan Note (Signed)
History of ischemic cardiomyopathy with EF most recently performed by cardiac MRI 03/20/2021 at 51%.  He is on carvedilol, Entresto and spironolactone.  He is asymptomatic.

## 2022-04-02 NOTE — Assessment & Plan Note (Addendum)
History of hyperlipidemia on statin therapy and Repatha with lipid profile performed 02/18/2022 revealing total cholesterol 105, LDL 36 and HDL 53.

## 2022-04-02 NOTE — Progress Notes (Signed)
04/02/2022 Justin Carroll   [birth year]  [id number]  Primary Physician Cory Munch, PA-C Primary Cardiologist: Lorretta Harp MD Lupe Carney, [place]  HPI:  Justin Carroll is a 67 y.o. mildly overweight married Caucasian male father of 7 children (2 of whom were adopted) grandfather 2 grandchildren, who I last saw in the hospital back in September 2022.  Since that time he has been seen by Dr. Aundra Dubin in the advanced heart failure clinic for medication optimization.  He is in solo practice as an Forensic psychologist and has a busy office schedule.  He is currently in the process of trying to pare this down.  His respecters include 30 pack years tobacco abuse having quit 9 years ago, treated hypertension and hyperlipidemia.  His father did die of a myocardial infarction at age 76.  He had a non-STEMI September 2022 and had PCI drug-eluting stenting of the AV groove circumflex by Dr. Fletcher Anon.  His medications for ischemic cardiomyopathy have been optimized by Dr. Aundra Dubin.  His most recent EF by cardiac MRI was 51%.   Current Meds  Medication Sig   atomoxetine (STRATTERA) 40 MG capsule Take 1 capsule (40 mg total) by mouth daily.   atorvastatin (LIPITOR) 80 MG tablet TAKE ONE (1) TABLET BY MOUTH EVERY DAY   Azelastine HCl 137 MCG/SPRAY SOLN USE 2 SPRAY(S) IN EACH NOSTRIL TWICE DAILY AS NEEDED FOR RHINITIS   buPROPion (WELLBUTRIN XL) 300 MG 24 hr tablet Take 1 tablet by mouth once daily   carvedilol (COREG) 12.5 MG tablet TAKE ONE TABLET BY MOUTH TWICE A DAY WITH A MEAL   Cholecalciferol (VITAMIN D-3 PO) Take 2,000 Units by mouth daily.   clopidogrel (PLAVIX) 75 MG tablet Take 1 tablet (75 mg total) by mouth daily.   dapagliflozin propanediol (FARXIGA) 10 MG TABS tablet Take 1 tablet (10 mg total) by mouth daily.   Evolocumab (REPATHA SURECLICK) XX123456 MG/ML SOAJ Inject 140 mg into the skin every 14 (fourteen) days.   levocetirizine (XYZAL) 5 MG tablet Take 5 mg by mouth every evening.   LORazepam  (ATIVAN) 0.5 MG tablet Take 1 tablet (0.5 mg total) by mouth daily as needed for anxiety.   mometasone (NASONEX) 50 MCG/ACT nasal spray Place 1 spray into the nose daily.   nitroGLYCERIN (NITROSTAT) 0.4 MG SL tablet DISSOLVE ONE TABLET UNDER THE TONGUE EVERY 5 MINUTES AS NEEDED FOR CHEST PAIN.  DO NOT EXCEED A TOTAL OF 3 DOSES IN 15 MINUTES   pantoprazole (PROTONIX) 40 MG tablet Take 1 tablet by mouth once daily   sacubitril-valsartan (ENTRESTO) 24-26 MG Take 1 tablet by mouth 2 (two) times daily.   spironolactone (ALDACTONE) 25 MG tablet Take 1 tablet (25 mg total) by mouth at bedtime.   tamsulosin (FLOMAX) 0.4 MG CAPS capsule TAKE 1 CAPSULE BY MOUTH ONCE DAILY AFTER SUPPER   testosterone cypionate (DEPOTESTOSTERONE CYPIONATE) 200 MG/ML injection INJECT 0.5 MLS INTO THE MUSCLE TWICE A WEEK.   traZODone (DESYREL) 100 MG tablet TAKE 1 TABLET BY MOUTH AT BEDTIME   triamcinolone cream (KENALOG) 0.1 % Apply 1 Application topically as needed.     No Known Allergies  Social History   Socioeconomic History   Marital status: Married    Spouse name: Presenter, broadcasting   Number of children: 7   Years of education: Not on file   Highest education level: Professional school degree (e.g., MD, DDS, DVM, JD)  Occupational History   Occupation: Lawyer    Comment: self  employeed  Tobacco Use   Smoking status: Former    Packs/day: 1.50    Years: 10.00    Total pack years: 15.00    Types: Cigarettes    Start date: 14    Quit date: 2015    Years since quitting: 9.1   Smokeless tobacco: Never  Vaping Use   Vaping Use: Never used  Substance and Sexual Activity   Alcohol use: No   Drug use: No   Sexual activity: Not on file  Other Topics Concern   Not on file  Social History Narrative   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   Social Determinants of Health   Financial Resource Strain: Medium Risk (10/20/2020)   Overall Financial Resource Strain (CARDIA)    Difficulty of Paying Living Expenses: Somewhat  hard  Food Insecurity: No Food Insecurity (10/20/2020)   Hunger Vital Sign    Worried About Running Out of Food in the Last Year: Never true    Ran Out of Food in the Last Year: Never true  Transportation Needs: No Transportation Needs (10/20/2020)   PRAPARE - Hydrologist (Medical): No    Lack of Transportation (Non-Medical): No  Physical Activity: Not on file  Stress: Not on file  Social Connections: Not on file  Intimate Partner Violence: Not on file     Review of Systems: General: negative for chills, fever, night sweats or weight changes.  Cardiovascular: negative for chest pain, dyspnea on exertion, edema, orthopnea, palpitations, paroxysmal nocturnal dyspnea or shortness of breath Dermatological: negative for rash Respiratory: negative for cough or wheezing Urologic: negative for hematuria Abdominal: negative for nausea, vomiting, diarrhea, bright red blood per rectum, melena, or hematemesis Neurologic: negative for visual changes, syncope, or dizziness All other systems reviewed and are otherwise negative except as noted above.    Blood pressure 98/86, pulse 86, height '5\' 7"'$  (1.702 m), weight 172 lb (78 kg), SpO2 93 %.  General appearance: alert and no distress Neck: no adenopathy, no carotid bruit, no JVD, supple, symmetrical, trachea midline, and thyroid not enlarged, symmetric, no tenderness/mass/nodules Lungs: clear to auscultation bilaterally Heart: regular rate and rhythm, S1, S2 normal, no murmur, click, rub or gallop Extremities: extremities normal, atraumatic, no cyanosis or edema Pulses: 2+ and symmetric Skin: Skin color, texture, turgor normal. No rashes or lesions Neurologic: Grossly normal  EKG not performed today  ASSESSMENT AND PLAN:   NSTEMI (non-ST elevated myocardial infarction) ([place]) History of CAD status post non-STEMI 10/18/2020 with cardiac catheterization performed by Dr. Fletcher Anon revealing a 85% mid AV groove circumflex  which was stented with a Medtronic Onyx frontier 3.5 mm x 15 mm long drug-eluting stent.  He denies chest pain or shortness of breath.  He remains on dual antiplatelet therapy.  Hyperlipemia History of hyperlipidemia on statin therapy and Repatha with lipid profile performed 02/18/2022 revealing total cholesterol 105, LDL 36 and HDL 53.  Left ventricular dysfunction History of ischemic cardiomyopathy with EF most recently performed by cardiac MRI 03/20/2021 at 51%.  He is on carvedilol, Entresto and spironolactone.  He is asymptomatic.     Lorretta Harp MD FACP,FACC,FAHA, [facility] 04/02/2022 4:21 PM

## 2022-04-05 ENCOUNTER — Other Ambulatory Visit: Payer: Self-pay | Admitting: Pharmacist

## 2022-04-05 MED ORDER — REPATHA SURECLICK 140 MG/ML ~~LOC~~ SOAJ: 140.0000 mg | SUBCUTANEOUS | 12 refills | Status: DC

## 2022-05-07 ENCOUNTER — Encounter: Payer: Self-pay | Admitting: Internal Medicine

## 2022-05-07 ENCOUNTER — Ambulatory Visit (INDEPENDENT_AMBULATORY_CARE_PROVIDER_SITE_OTHER): Payer: Medicare Other | Admitting: Internal Medicine

## 2022-05-07 VITALS — BP 91/62 | HR 68 | Ht 66.0 in | Wt 174.0 lb

## 2022-05-07 DIAGNOSIS — I2510 Atherosclerotic heart disease of native coronary artery without angina pectoris: Secondary | ICD-10-CM

## 2022-05-07 DIAGNOSIS — R41840 Attention and concentration deficit: Secondary | ICD-10-CM

## 2022-05-07 DIAGNOSIS — Z0001 Encounter for general adult medical examination with abnormal findings: Secondary | ICD-10-CM

## 2022-05-07 DIAGNOSIS — Z8639 Personal history of other endocrine, nutritional and metabolic disease: Secondary | ICD-10-CM

## 2022-05-07 DIAGNOSIS — F32A Depression, unspecified: Secondary | ICD-10-CM

## 2022-05-07 DIAGNOSIS — E782 Mixed hyperlipidemia: Secondary | ICD-10-CM

## 2022-05-07 DIAGNOSIS — F418 Other specified anxiety disorders: Secondary | ICD-10-CM | POA: Diagnosis not present

## 2022-05-07 DIAGNOSIS — J3089 Other allergic rhinitis: Secondary | ICD-10-CM

## 2022-05-07 DIAGNOSIS — J302 Other seasonal allergic rhinitis: Secondary | ICD-10-CM

## 2022-05-07 DIAGNOSIS — Z1159 Encounter for screening for other viral diseases: Secondary | ICD-10-CM

## 2022-05-07 DIAGNOSIS — G4700 Insomnia, unspecified: Secondary | ICD-10-CM

## 2022-05-07 DIAGNOSIS — N182 Chronic kidney disease, stage 2 (mild): Secondary | ICD-10-CM

## 2022-05-07 DIAGNOSIS — Z23 Encounter for immunization: Secondary | ICD-10-CM | POA: Diagnosis not present

## 2022-05-07 DIAGNOSIS — K219 Gastro-esophageal reflux disease without esophagitis: Secondary | ICD-10-CM

## 2022-05-07 DIAGNOSIS — N400 Enlarged prostate without lower urinary tract symptoms: Secondary | ICD-10-CM

## 2022-05-07 DIAGNOSIS — F419 Anxiety disorder, unspecified: Secondary | ICD-10-CM

## 2022-05-07 DIAGNOSIS — I519 Heart disease, unspecified: Secondary | ICD-10-CM

## 2022-05-07 DIAGNOSIS — G4733 Obstructive sleep apnea (adult) (pediatric): Secondary | ICD-10-CM

## 2022-05-07 HISTORY — DX: Encounter for general adult medical examination with abnormal findings: Z00.01

## 2022-05-07 HISTORY — DX: Chronic kidney disease, stage 2 (mild): N18.2

## 2022-05-07 MED ORDER — LORAZEPAM 0.5 MG PO TABS: 0.5000 mg | ORAL_TABLET | Freq: Every day | ORAL | 0 refills | Status: DC | PRN

## 2022-05-07 MED ORDER — TAMSULOSIN HCL 0.4 MG PO CAPS: ORAL_CAPSULE | ORAL | 0 refills | Status: DC

## 2022-05-07 MED ORDER — TRAZODONE HCL 100 MG PO TABS: 100.0000 mg | ORAL_TABLET | Freq: Every day | ORAL | 0 refills | Status: DC

## 2022-05-07 MED ORDER — PANTOPRAZOLE SODIUM 40 MG PO TBEC: 40.0000 mg | DELAYED_RELEASE_TABLET | Freq: Every day | ORAL | 0 refills | Status: DC

## 2022-05-07 MED ORDER — BUPROPION HCL ER (XL) 300 MG PO TB24: 300.0000 mg | ORAL_TABLET | Freq: Every day | ORAL | 0 refills | Status: DC

## 2022-05-07 NOTE — Assessment & Plan Note (Addendum)
Symptoms are currently well-controlled with Protonix 40 mg daily. -No medication changes today 

## 2022-05-07 NOTE — Assessment & Plan Note (Signed)
He endorses a history of hypogonadism and has previously been on testosterone replacement therapy.  He is interested in resuming testosterone replacement therapy today. -Repeat free/total testosterone levels ordered.  Will determine appropriate dosing based on results.

## 2022-05-07 NOTE — Patient Instructions (Signed)
It was a pleasure to see you today.  Thank you for giving [place] the opportunity to be involved in your care.  Below is a brief recap of your visit and next steps.  We will plan to see you again in 3 months.  Summary You have established care today We will update labs today Medications have been refilled We will plan for follow up in 3 months

## 2022-05-07 NOTE — Assessment & Plan Note (Addendum)
Mood currently stable.  Anxiety well-controlled.  He is prescribed Wellbutrin and Ativan 0.5 mg as needed.  PDMP reviewed and is appropriate. -No medication changes today -Ativan and Wellbutrin have been refilled.

## 2022-05-07 NOTE — Assessment & Plan Note (Signed)
Previously documented history of CAD with history of NSTEMI in September 2022.  Underwent successful PCI to mid LCx.  He is currently on Plavix, carvedilol, Lipitor, and Repatha.  Followed by cardiology (Dr. Gwenlyn Found).  Denies recent chest pain. -No medication changes today -Cardiology follow-up scheduled for August 2024

## 2022-05-07 NOTE — Assessment & Plan Note (Signed)
Presenting today to establish care.  Previous records and labs been reviewed. -Baseline labs ordered today -Due for repeat colonoscopy.  He would like to discuss this with his cardiologist before following up with GI. -PCV 20 administered today -We will tentatively plan for follow-up in 3 months

## 2022-05-07 NOTE — Assessment & Plan Note (Signed)
Previously documented history of CKD stage II.  Repeat labs ordered today.

## 2022-05-07 NOTE — Progress Notes (Signed)
New Patient Office Visit  Subjective    Patient ID: Justin Carroll, male    DOB: 22-Jan-1956  Age: 67 y.o. MRN: [id number]  CC:  Chief Complaint  Patient presents with   Establish Care    HPI Justin Carroll presents to establish care.  He is a 67 year old male with a past medical history significant for CAD with history of NSTEMI s/p PCI in September 2022, HLD, GERD, BPH, depression and anxiety, insomnia, and history of hypogonadism, HFrEF, former tobacco use, OSA on CPAP, seasonal allergies, and ADD.  He has most recently been followed by Justin Mares, PA-C.  Mr. Justin Carroll reports feeling well today.  He has no acute concerns to discuss aside from desiring to establish care.  Currently works as a Chief Executive Officer.  Denies current tobacco, alcohol, and illicit drug use. Chronic medical conditions and outstanding preventative care items discussed today are individually addressed A/P below.  Outpatient Encounter Medications as of 05/07/2022  Medication Sig   atomoxetine (STRATTERA) 40 MG capsule Take 1 capsule (40 mg total) by mouth daily.   atorvastatin (LIPITOR) 80 MG tablet TAKE ONE (1) TABLET BY MOUTH EVERY DAY   Azelastine HCl 137 MCG/SPRAY SOLN USE 2 SPRAY(S) IN EACH NOSTRIL TWICE DAILY AS NEEDED FOR RHINITIS   carvedilol (COREG) 12.5 MG tablet TAKE ONE TABLET BY MOUTH TWICE A DAY WITH A MEAL   Cholecalciferol (VITAMIN D-3 PO) Take 2,000 Units by mouth daily.   clopidogrel (PLAVIX) 75 MG tablet Take 1 tablet (75 mg total) by mouth daily.   dapagliflozin propanediol (FARXIGA) 10 MG TABS tablet Take 1 tablet (10 mg total) by mouth daily.   Evolocumab (REPATHA SURECLICK) [id number] MG/ML SOAJ Inject 140 mg into the skin every 14 (fourteen) days.   levocetirizine (XYZAL) 5 MG tablet Take 5 mg by mouth every evening.   LORazepam (ATIVAN) 0.5 MG tablet Take 1 tablet (0.5 mg total) by mouth daily as needed for anxiety.   mometasone (NASONEX) 50 MCG/ACT nasal spray Place 1 spray into the nose daily.    nitroGLYCERIN (NITROSTAT) 0.4 MG SL tablet DISSOLVE ONE TABLET UNDER THE TONGUE EVERY 5 MINUTES AS NEEDED FOR CHEST PAIN.  DO NOT EXCEED A TOTAL OF 3 DOSES IN 15 MINUTES   sacubitril-valsartan (ENTRESTO) 24-26 MG Take 1 tablet by mouth 2 (two) times daily.   spironolactone (ALDACTONE) 25 MG tablet Take 1 tablet (25 mg total) by mouth at bedtime.   testosterone cypionate (DEPOTESTOSTERONE CYPIONATE) 200 MG/ML injection INJECT 0.5 MLS INTO THE MUSCLE TWICE A WEEK.   triamcinolone cream (KENALOG) 0.1 % Apply 1 Application topically as needed.   [DISCONTINUED] buPROPion (WELLBUTRIN XL) 300 MG 24 hr tablet Take 1 tablet by mouth once daily   [DISCONTINUED] LORazepam (ATIVAN) 0.5 MG tablet Take 1 tablet (0.5 mg total) by mouth daily as needed for anxiety.   [DISCONTINUED] pantoprazole (PROTONIX) 40 MG tablet Take 1 tablet by mouth once daily   [DISCONTINUED] tamsulosin (FLOMAX) 0.4 MG CAPS capsule TAKE 1 CAPSULE BY MOUTH ONCE DAILY AFTER SUPPER   [DISCONTINUED] traZODone (DESYREL) 100 MG tablet TAKE 1 TABLET BY MOUTH AT BEDTIME   buPROPion (WELLBUTRIN XL) 300 MG 24 hr tablet Take 1 tablet (300 mg total) by mouth daily.   pantoprazole (PROTONIX) 40 MG tablet Take 1 tablet (40 mg total) by mouth daily.   tamsulosin (FLOMAX) 0.4 MG CAPS capsule TAKE 1 CAPSULE BY MOUTH ONCE DAILY AFTER SUPPER   traZODone (DESYREL) 100 MG tablet Take 1 tablet (100 mg total) by  mouth at bedtime.   No facility-administered encounter medications on file as of 05/07/2022.    Past Medical History:  Diagnosis Date   Allergy Lifetime   Mostly pollen   Anxiety Not sure   Ativan prn. A few times a month   CKD (chronic kidney disease) stage 2, GFR 60-89 ml/min 05/07/2022   Depression    Encounter for general adult medical examination with abnormal findings 05/07/2022   GERD (gastroesophageal reflux disease) 5 years   Myocardial infarction 10/17/2020   Stent   Sinus infection    yearly   Sleep apnea Diagnosed 2022   CPAP    Substance abuse 40+ years ago   Sober since 11/19/1985    Past Surgical History:  Procedure Laterality Date   ADENOIDECTOMY     CORONARY IMAGING/OCT N/A 10/18/2020   Procedure: INTRAVASCULAR IMAGING/OCT;  Surgeon: Wellington Hampshire, MD;  Location: [place] [facility];  Service: Cardiovascular;  Laterality: N/A;   CORONARY STENT INTERVENTION N/A 10/18/2020   Procedure: CORONARY STENT INTERVENTION;  Surgeon: Wellington Hampshire, MD;  Location: [place] [facility];  Service: Cardiovascular;  Laterality: N/A;   LEFT HEART CATH AND CORONARY ANGIOGRAPHY N/A 10/18/2020   Procedure: LEFT HEART CATH AND CORONARY ANGIOGRAPHY;  Surgeon: Wellington Hampshire, MD;  Location: [place] [facility];  Service: Cardiovascular;  Laterality: N/A;   NASAL SEPTUM SURGERY     SINOSCOPY     TONSILLECTOMY     age 79    Family History  Problem Relation Age of Onset   Stroke Mother    Alcohol abuse Father    Heart disease Father    Allergic rhinitis Brother    Alcohol abuse Brother    Anxiety disorder Brother    Depression Brother    Depression Brother    Drug abuse Brother    Stroke Brother    Heart disease Paternal Uncle    Heart disease Paternal Uncle    Colon cancer Neg Hx    Pancreatic cancer Neg Hx    Rectal cancer Neg Hx    Stomach cancer Neg Hx     Social History   Socioeconomic History   Marital status: Married    Spouse name: Justin Carroll   Number of children: 7   Years of education: Not on file   Highest education level: Professional school degree (e.g., MD, DDS, DVM, JD)  Occupational History   Occupation: Lawyer    Comment: self employeed  Tobacco Use   Smoking status: Former    Packs/day: 1.50    Years: 10.00    Additional pack years: 0.00    Total pack years: 15.00    Types: Cigarettes    Start date: 80    Quit date: 02/04/2013    Years since quitting: 9.2   Smokeless tobacco: Never  Vaping Use   Vaping Use: Never used  Substance and Sexual Activity   Alcohol use: Not  Currently   Drug use: No   Sexual activity: Yes    Birth control/protection: Surgical  Other Topics Concern   Not on file  Social History Narrative   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   Social Determinants of Health   Financial Resource Strain: Medium Risk (10/20/2020)   Overall Financial Resource Strain (CARDIA)    Difficulty of Paying Living Expenses: Somewhat hard  Food Insecurity: No Food Insecurity (10/20/2020)   Hunger Vital Sign    Worried About Running Out of Food in the Last Year: Never true  Ran Out of Food in the Last Year: Never true  Transportation Needs: No Transportation Needs (10/20/2020)   PRAPARE - Hydrologist (Medical): No    Lack of Transportation (Non-Medical): No  Physical Activity: Not on file  Stress: Not on file  Social Connections: Not on file  Intimate Partner Violence: Not on file    Review of Systems  Constitutional:  Negative for chills and fever.  HENT:  Negative for sore throat.   Respiratory:  Negative for cough and shortness of breath.   Cardiovascular:  Negative for chest pain, palpitations and leg swelling.  Gastrointestinal:  Negative for abdominal pain, blood in stool, constipation, diarrhea, nausea and vomiting.  Genitourinary:  Negative for dysuria and hematuria.  Musculoskeletal:  Negative for myalgias.  Skin:  Negative for itching and rash.  Neurological:  Negative for dizziness and headaches.  Psychiatric/Behavioral:  Negative for depression and suicidal ideas.     Objective    BP 91/62   Pulse 68   Ht 5\' 6"  (1.676 m)   Wt 174 lb (78.9 kg)   SpO2 93%   BMI 28.08 kg/m   Physical Exam Vitals reviewed.  Constitutional:      General: He is not in acute distress.    Appearance: Normal appearance. He is not ill-appearing.  HENT:     Head: Normocephalic and atraumatic.     Right Ear: External ear normal.     Left Ear: External ear normal.     Nose: Nose normal. No congestion or rhinorrhea.      Mouth/Throat:     Mouth: Mucous membranes are moist.     Pharynx: Oropharynx is clear.  Eyes:     General: No scleral icterus.    Extraocular Movements: Extraocular movements intact.     Conjunctiva/sclera: Conjunctivae normal.     Pupils: Pupils are equal, round, and reactive to light.  Cardiovascular:     Rate and Rhythm: Normal rate and regular rhythm.     Pulses: Normal pulses.     Heart sounds: Normal heart sounds. No murmur heard. Pulmonary:     Effort: Pulmonary effort is normal.     Breath sounds: Normal breath sounds. No wheezing, rhonchi or rales.  Abdominal:     General: Abdomen is flat. Bowel sounds are normal. There is no distension.     Palpations: Abdomen is soft.     Tenderness: There is no abdominal tenderness.  Musculoskeletal:        General: No swelling or deformity. Normal range of motion.     Cervical back: Normal range of motion.  Skin:    General: Skin is warm and dry.     Capillary Refill: Capillary refill takes less than 2 seconds.  Neurological:     General: No focal deficit present.     Mental Status: He is alert and oriented to person, place, and time.     Motor: No weakness.  Psychiatric:        Mood and Affect: Mood normal.        Behavior: Behavior normal.        Thought Content: Thought content normal.   Last CBC Lab Results  Component Value Date   WBC 5.9 08/02/2021   HGB 15.6 08/02/2021   HCT 48.7 08/02/2021   MCV 97.4 08/02/2021   MCH 31.2 08/02/2021   RDW 14.6 08/02/2021   PLT 175 0000000   Last metabolic panel Lab Results  Component Value Date  GLUCOSE 89 02/18/2022   NA 136 02/18/2022   K 4.9 02/18/2022   CL 102 02/18/2022   CO2 25 02/18/2022   BUN 11 02/18/2022   CREATININE 1.12 02/18/2022   GFRNONAA >60 02/18/2022   CALCIUM 8.6 (L) 02/18/2022   PROT 6.7 12/06/2020   ALBUMIN 3.8 12/06/2020   LABGLOB 2.4 02/18/2019   AGRATIO 2.0 02/18/2019   BILITOT 1.1 12/06/2020   ALKPHOS 65 12/06/2020   AST 26 12/06/2020    ALT 24 12/06/2020   ANIONGAP 9 02/18/2022   Last lipids Lab Results  Component Value Date   CHOL 105 02/18/2022   HDL 53 02/18/2022   LDLCALC 36 02/18/2022   TRIG 79 02/18/2022   CHOLHDL 2.0 02/18/2022   Last hemoglobin A1c Lab Results  Component Value Date   HGBA1C 6.3 (H) 10/18/2020   Last thyroid functions Lab Results  Component Value Date   TSH 0.592 10/18/2020   Last vitamin D Lab Results  Component Value Date   VD25OH 60 06/15/2020   Last vitamin B12 and Folate Lab Results  Component Value Date   VITAMINB12 460 01/27/2019   FOLATE 10.7 01/27/2019    Assessment & Plan:   Problem List Items Addressed This Visit       Left ventricular dysfunction    History of ischemic cardiomyopathy.  EF decreased to 35-40% in September 2022.  Has recovered to 51% on recent cardiac MRI.  He is followed by the advanced heart failure clinic (Dr. Aundra Dubin).  Currently prescribed Entresto, Farxiga, carvedilol, and Aldactone.  Appears euvolemic on exam today. -No medication changes today      CAD (coronary artery disease)    Previously documented history of CAD with history of NSTEMI in September 2022.  Underwent successful PCI to mid LCx.  He is currently on Plavix, carvedilol, Lipitor, and Repatha.  Followed by cardiology (Dr. Gwenlyn Found).  Denies recent chest pain. -No medication changes today -Cardiology follow-up scheduled for August 2024      Seasonal and perennial allergic rhinitis    Symptoms are well-controlled with Xyzal, Nasonex, and Astelin.  No medication changes today.      OSA on CPAP    Endorses nightly compliance with CPAP      GERD (gastroesophageal reflux disease)    Symptoms are currently well-controlled with Protonix 40 mg daily. -No medication changes today      BPH (benign prostatic hyperplasia)    Symptoms are currently well-controlled with Flomax.   -No medication changes today.      CKD (chronic kidney disease) stage 2, GFR 60-89 ml/min     Previously documented history of CKD stage II.  Repeat labs ordered today.      Hyperlipemia    Currently prescribed atorvastatin and Repatha.  Lipid panel updated in January.  Total cholesterol 105 and LDL 36. -No medication changes today      Attention deficit    Currently prescribed Strattera.  This is managed by neurology (Dr. Brett Fairy). -No medication changes today      Anxiety and depression    Mood currently stable.  Anxiety well-controlled.  He is prescribed Wellbutrin and Ativan 0.5 mg as needed.  PDMP reviewed and is appropriate. -No medication changes today -Ativan and Wellbutrin have been refilled.      Insomnia    Currently prescribed trazodone 100 mg nightly.  This has been refilled today.      History of hypogonadism    He endorses a history of hypogonadism and has previously been on  testosterone replacement therapy.  He is interested in resuming testosterone replacement therapy today. -Repeat free/total testosterone levels ordered.  Will determine appropriate dosing based on results.      Encounter for general adult medical examination with abnormal findings - Primary    Presenting today to establish care.  Previous records and labs been reviewed. -Baseline labs ordered today -Due for repeat colonoscopy.  He would like to discuss this with his cardiologist before following up with GI. -PCV 20 administered today -We will tentatively plan for follow-up in 3 months      Return in about 3 months (around 08/06/2022).   Johnette Abraham, MD

## 2022-05-07 NOTE — Assessment & Plan Note (Signed)
History of ischemic cardiomyopathy.  EF decreased to 35-40% in September 2022.  Has recovered to 51% on recent cardiac MRI.  He is followed by the advanced heart failure clinic (Dr. Aundra Dubin).  Currently prescribed Entresto, Farxiga, carvedilol, and Aldactone.  Appears euvolemic on exam today. -No medication changes today

## 2022-05-07 NOTE — Assessment & Plan Note (Signed)
Symptoms are well-controlled with Xyzal, Nasonex, and Astelin.  No medication changes today.

## 2022-05-07 NOTE — Assessment & Plan Note (Signed)
Symptoms are currently well-controlled with Flomax.   -No medication changes today.

## 2022-05-07 NOTE — Assessment & Plan Note (Signed)
Currently prescribed trazodone 100 mg nightly.  This has been refilled today.

## 2022-05-07 NOTE — Assessment & Plan Note (Signed)
Currently prescribed Strattera.  This is managed by neurology (Dr. Brett Fairy). -No medication changes today

## 2022-05-07 NOTE — Assessment & Plan Note (Signed)
Currently prescribed atorvastatin and Repatha.  Lipid panel updated in January.  Total cholesterol 105 and LDL 36. -No medication changes today

## 2022-05-07 NOTE — Assessment & Plan Note (Signed)
Endorses nightly compliance with CPAP

## 2022-05-08 ENCOUNTER — Encounter: Payer: Self-pay | Admitting: Internal Medicine

## 2022-05-09 LAB — CBC WITH DIFFERENTIAL/PLATELET: Immature Grans (Abs): 0.1 10*3/uL (ref 0.0–0.1)

## 2022-05-10 LAB — CBC WITH DIFFERENTIAL/PLATELET
Basophils Absolute: 0 10*3/uL (ref 0.0–0.2)
Basos: 0 %
EOS (ABSOLUTE): 0.1 10*3/uL (ref 0.0–0.4)
Eos: 2 %
Hematocrit: 53.5 % — ABNORMAL HIGH (ref 37.5–51.0)
Hemoglobin: 18.1 g/dL — ABNORMAL HIGH (ref 13.0–17.7)
Immature Grans (Abs): 0.1 10*3/uL (ref 0.0–0.1)
Immature Granulocytes: 1 %
Lymphocytes Absolute: 2.5 10*3/uL (ref 0.7–3.1)
Lymphs: 37 %
MCH: 31.4 pg (ref 26.6–33.0)
MCHC: 33.8 g/dL (ref 31.5–35.7)
MCV: 93 fL (ref 79–97)
Monocytes Absolute: 0.6 10*3/uL (ref 0.1–0.9)
Monocytes: 9 %
Neutrophils Absolute: 3.5 10*3/uL (ref 1.4–7.0)
Neutrophils: 51 %
Platelets: 165 10*3/uL (ref 150–450)
RBC: 5.77 x10E6/uL (ref 4.14–5.80)
RDW: 12.9 % (ref 11.6–15.4)
WBC: 6.8 10*3/uL (ref 3.4–10.8)

## 2022-05-10 LAB — LIPID PANEL
Chol/HDL Ratio: 1.8 ratio (ref 0.0–5.0)
Cholesterol, Total: 113 mg/dL (ref 100–199)
HDL: 63 mg/dL (ref >39)
LDL Chol Calc (NIH): 26 mg/dL (ref 0–99)
Triglycerides: 142 mg/dL (ref 0–149)
VLDL Cholesterol Cal: 24 mg/dL (ref 5–40)

## 2022-05-10 LAB — CMP14+EGFR
ALT: 46 IU/L — ABNORMAL HIGH (ref 0–44)
AST: 31 IU/L (ref 0–40)
Albumin/Globulin Ratio: 1.8 (ref 1.2–2.2)
Albumin: 4.3 g/dL (ref 3.9–4.9)
Alkaline Phosphatase: 81 IU/L (ref 44–121)
BUN/Creatinine Ratio: 15 (ref 10–24)
BUN: 17 mg/dL (ref 8–27)
Bilirubin Total: 0.3 mg/dL (ref 0.0–1.2)
CO2: 20 mmol/L (ref 20–29)
Calcium: 9.4 mg/dL (ref 8.6–10.2)
Chloride: 102 mmol/L (ref 96–106)
Creatinine, Ser: 1.12 mg/dL (ref 0.76–1.27)
Globulin, Total: 2.4 g/dL (ref 1.5–4.5)
Glucose: 105 mg/dL — ABNORMAL HIGH (ref 70–99)
Potassium: 4.6 mmol/L (ref 3.5–5.2)
Sodium: 139 mmol/L (ref 134–144)
Total Protein: 6.7 g/dL (ref 6.0–8.5)
eGFR: 72 mL/min/{1.73_m2} (ref >59)

## 2022-05-10 LAB — TESTOSTERONE,FREE AND TOTAL
Testosterone, Free: 4.9 pg/mL — ABNORMAL LOW (ref 6.6–18.1)
Testosterone: 304 ng/dL (ref 264–916)

## 2022-05-10 LAB — HEMOGLOBIN A1C
Est. average glucose Bld gHb Est-mCnc: 146 mg/dL
Hgb A1c MFr Bld: 6.7 % — ABNORMAL HIGH (ref 4.8–5.6)

## 2022-05-10 LAB — B12 AND FOLATE PANEL
Folate: 5.3 ng/mL (ref >3.0)
Vitamin B-12: 446 pg/mL (ref 232–1245)

## 2022-05-10 LAB — VITAMIN D 25 HYDROXY (VIT D DEFICIENCY, FRACTURES): Vit D, 25-Hydroxy: 50.8 ng/mL (ref 30.0–100.0)

## 2022-05-10 LAB — HCV AB W REFLEX TO QUANT PCR: HCV Ab: NONREACTIVE

## 2022-05-10 LAB — HCV INTERPRETATION

## 2022-05-10 LAB — TSH+FREE T4
Free T4: 1.19 ng/dL (ref 0.82–1.77)
TSH: 1.09 u[IU]/mL (ref 0.450–4.500)

## 2022-06-05 ENCOUNTER — Telehealth (HOSPITAL_COMMUNITY): Payer: Self-pay

## 2022-06-05 ENCOUNTER — Other Ambulatory Visit (HOSPITAL_COMMUNITY): Payer: Self-pay

## 2022-06-05 NOTE — Telephone Encounter (Signed)
Advanced Heart Failure Patient Advocate Encounter  Responded to patient email regarding Brilinta and [place] deliveries, as well as Repatha questions. Patient has received Brilinta via [facility] even though the medication has been discontinued.   Clarified that the patient is able to obtain [place] using either the [facility], or [facility] at this time. Provided information for dropoff/ disposal of medication at local pharmacy.  The patient was approved for a [employer] grant that will help cover the cost of Repatha.  Total amount awarded, $10,000.  Effective: 05/06/2022 - 05/05/2023.  BIN [id number] PCN [id number] [id number] ID [id number]  Patient provided with approval and processing information via email. Billing information also provided directly to [facility].  [name], CPhT Rx Patient Advocate Phone: [phone]

## 2022-06-10 ENCOUNTER — Encounter: Payer: Self-pay | Admitting: Internal Medicine

## 2022-06-10 ENCOUNTER — Ambulatory Visit (INDEPENDENT_AMBULATORY_CARE_PROVIDER_SITE_OTHER): Payer: Medicare Other | Admitting: Internal Medicine

## 2022-06-10 VITALS — BP 89/58 | HR 64 | Ht 66.0 in | Wt 171.4 lb

## 2022-06-10 DIAGNOSIS — N182 Chronic kidney disease, stage 2 (mild): Secondary | ICD-10-CM

## 2022-06-10 DIAGNOSIS — E1122 Type 2 diabetes mellitus with diabetic chronic kidney disease: Secondary | ICD-10-CM

## 2022-06-10 DIAGNOSIS — Z8639 Personal history of other endocrine, nutritional and metabolic disease: Secondary | ICD-10-CM | POA: Diagnosis not present

## 2022-06-10 NOTE — Assessment & Plan Note (Signed)
History of hypogonadism, previously on TRT and is interested in resuming treatment.  He states that TRT was previously discontinued as his provider transitioned to a different practice. -Urology referral placed today

## 2022-06-10 NOTE — Assessment & Plan Note (Signed)
New diagnosis.  Meeting criteria with A1c 6.7 on labs from last month.  He has made significant dietary changes in light of this result. -Through shared decision making, Justin Carroll will continue to focus on lifestyle modifications aimed at lowering his blood sugar.  Will not add any additional medications at this time.  He is currently on SGLT2i and ARNI, providing renal protection in the setting of HF, CKD, and now with DM. -Urine microalbumin/creatinine ratio ordered today -Diabetic foot exam completed -He will contact his ophthalmologist to schedule a diabetic eye exam -We will tentatively plan for follow-up in October and repeat A1c at that time

## 2022-06-10 NOTE — Progress Notes (Signed)
Established Patient Office Visit  Subjective   Patient ID: Justin Carroll, male    DOB: 1956/01/22  Age: 67 y.o. MRN: [id number]  Chief Complaint  Patient presents with   Results    Go over labs   Mr. Justin Carroll returns to care today for follow-up for lab review in the setting of newly diagnosed diabetes mellitus.  Last evaluated by me on 4/2 as a new patient presenting to establish care.  Baseline labs were obtained at that time, subsequently demonstrating A1c 6.7 meeting criteria for T2DM.  There have been no acute interval events. Mr. Justin Carroll states that he has made significant dietary changes in light of recent lab results.  He has stopped drinking soft drinks and reduced his carbohydrate intake.  He has also made an effort to drink more water in general.  He has no additional concerns to discuss today.  Past Medical History:  Diagnosis Date   Allergy Lifetime   Mostly pollen   Anxiety Not sure   Ativan prn. A few times a month   CKD (chronic kidney disease) stage 2, GFR 60-89 ml/min 05/07/2022   Depression    Encounter for general adult medical examination with abnormal findings 05/07/2022   GERD (gastroesophageal reflux disease) 5 years   Myocardial infarction (HCC) 10/17/2020   Stent   Sinus infection    yearly   Sleep apnea Diagnosed 2022   CPAP   Substance abuse (HCC) 40+ years ago   Sober since 11/19/1985   Past Surgical History:  Procedure Laterality Date   ADENOIDECTOMY     CORONARY IMAGING/OCT N/A 10/18/2020   Procedure: INTRAVASCULAR IMAGING/OCT;  Surgeon: Iran Ouch, MD;  [facility]: [facility];  Service: Cardiovascular;  Laterality: N/A;   CORONARY STENT INTERVENTION N/A 10/18/2020   Procedure: CORONARY STENT INTERVENTION;  Surgeon: Iran Ouch, MD;  Location: [facility];  Service: Cardiovascular;  Laterality: N/A;   LEFT HEART CATH AND CORONARY ANGIOGRAPHY N/A 10/18/2020   Procedure: LEFT HEART CATH AND CORONARY ANGIOGRAPHY;  Surgeon: Iran Ouch, MD;  Location: [facility];  Service: Cardiovascular;  Laterality: N/A;   NASAL SEPTUM SURGERY     SINOSCOPY     TONSILLECTOMY     age 74   Social History   Tobacco Use   Smoking status: Former    Packs/day: 1.50    Years: 10.00    Additional pack years: 0.00    Total pack years: 15.00    Types: Cigarettes    Start date: 2    Quit date: 02/04/2013    Years since quitting: 9.3   Smokeless tobacco: Never  Vaping Use   Vaping Use: Never used  Substance Use Topics   Alcohol use: Not Currently   Drug use: No   Family History  Problem Relation Age of Onset   Stroke Mother    Alcohol abuse Father    Heart disease Father    Allergic rhinitis Brother    Alcohol abuse Brother    Anxiety disorder Brother    Depression Brother    Depression Brother    Drug abuse Brother    Stroke Brother    Heart disease Paternal Uncle    Heart disease Paternal Uncle    Colon cancer Neg Hx    Pancreatic cancer Neg Hx    Rectal cancer Neg Hx    Stomach cancer Neg Hx    No Known Allergies  Review of Systems  Constitutional:  Negative  for chills and fever.  HENT:  Negative for sore throat.   Respiratory:  Negative for cough and shortness of breath.   Cardiovascular:  Negative for chest pain, palpitations and leg swelling.  Gastrointestinal:  Negative for abdominal pain, blood in stool, constipation, diarrhea, nausea and vomiting.  Genitourinary:  Negative for dysuria and hematuria.  Musculoskeletal:  Negative for myalgias.  Skin:  Negative for itching and rash.  Neurological:  Negative for dizziness and headaches.  Psychiatric/Behavioral:  Negative for depression and suicidal ideas.      Objective:     BP (!) 89/58   Pulse 64   Ht 5\' 6"  (1.676 m)   Wt 171 lb 6.4 oz (77.7 kg)   SpO2 93%   BMI 27.66 kg/m  BP Readings from Last 3 Encounters:  06/10/22 (!) 89/58  05/07/22 91/62  04/02/22 98/86   Physical Exam Vitals reviewed.  Constitutional:      General:  He is not in acute distress.    Appearance: Normal appearance. He is not ill-appearing.  HENT:     Head: Normocephalic and atraumatic.     Right Ear: External ear normal.     Left Ear: External ear normal.     Nose: Nose normal. No congestion or rhinorrhea.     Mouth/Throat:     Mouth: Mucous membranes are moist.     Pharynx: Oropharynx is clear.  Eyes:     General: No scleral icterus.    Extraocular Movements: Extraocular movements intact.     Conjunctiva/sclera: Conjunctivae normal.     Pupils: Pupils are equal, round, and reactive to light.  Cardiovascular:     Rate and Rhythm: Normal rate and regular rhythm.     Pulses: Normal pulses.     Heart sounds: Normal heart sounds. No murmur heard. Pulmonary:     Effort: Pulmonary effort is normal.     Breath sounds: Normal breath sounds. No wheezing, rhonchi or rales.  Abdominal:     General: Abdomen is flat. Bowel sounds are normal. There is no distension.     Palpations: Abdomen is soft.     Tenderness: There is no abdominal tenderness.  Musculoskeletal:        General: No swelling or deformity. Normal range of motion.     Cervical back: Normal range of motion.  Skin:    General: Skin is warm and dry.     Capillary Refill: Capillary refill takes less than 2 seconds.  Neurological:     General: No focal deficit present.     Mental Status: He is alert and oriented to person, place, and time.     Motor: No weakness.  Psychiatric:        Mood and Affect: Mood normal.        Behavior: Behavior normal.        Thought Content: Thought content normal.   Diabetic foot exam was performed.  No deformities or other abnormal visual findings.  Posterior tibialis and dorsalis pulse intact bilaterally.  Intact to touch and monofilament testing bilaterally.   Last CBC Lab Results  Component Value Date   WBC 6.8 05/07/2022   HGB 18.1 (H) 05/07/2022   HCT 53.5 (H) 05/07/2022   MCV 93 05/07/2022   MCH 31.4 05/07/2022   RDW 12.9  05/07/2022   PLT 165 05/07/2022   Last metabolic panel Lab Results  Component Value Date   GLUCOSE 105 (H) 05/07/2022   NA 139 05/07/2022   K 4.6 05/07/2022  CL 102 05/07/2022   CO2 20 05/07/2022   BUN 17 05/07/2022   CREATININE 1.12 05/07/2022   EGFR 72 05/07/2022   CALCIUM 9.4 05/07/2022   PROT 6.7 05/07/2022   ALBUMIN 4.3 05/07/2022   LABGLOB 2.4 05/07/2022   AGRATIO 1.8 05/07/2022   BILITOT 0.3 05/07/2022   ALKPHOS 81 05/07/2022   AST 31 05/07/2022   ALT 46 (H) 05/07/2022   ANIONGAP 9 02/18/2022   Last lipids Lab Results  Component Value Date   CHOL 113 05/07/2022   HDL 63 05/07/2022   LDLCALC 26 05/07/2022   TRIG 142 05/07/2022   CHOLHDL 1.8 05/07/2022   Last hemoglobin A1c Lab Results  Component Value Date   HGBA1C 6.7 (H) 05/07/2022   Last thyroid functions Lab Results  Component Value Date   TSH 1.090 05/07/2022   Last vitamin D Lab Results  Component Value Date   VD25OH 50.8 05/07/2022   Last vitamin B12 and Folate Lab Results  Component Value Date   VITAMINB12 446 05/07/2022   FOLATE 5.3 05/07/2022     Assessment & Plan:   Problem List Items Addressed This Visit       Type 2 diabetes mellitus with diabetic chronic kidney disease (HCC) - Primary    New diagnosis.  Meeting criteria with A1c 6.7 on labs from last month.  He has made significant dietary changes in light of this result. -Through shared decision making, Mr. Spaziani will continue to focus on lifestyle modifications aimed at lowering his blood sugar.  Will not add any additional medications at this time.  He is currently on SGLT2i and ARNI, providing renal protection in the setting of HF, CKD, and now with DM. -Urine microalbumin/creatinine ratio ordered today -Diabetic foot exam completed -He will contact his ophthalmologist to schedule a diabetic eye exam -We will tentatively plan for follow-up in October and repeat A1c at that time      History of hypogonadism    History of  hypogonadism, previously on TRT and is interested in resuming treatment.  He states that TRT was previously discontinued as his provider transitioned to a different practice. -Urology referral placed today       Return in about 5 months (around 11/10/2022).    Billie Lade, MD

## 2022-06-10 NOTE — Patient Instructions (Signed)
It was a pleasure to see you today.  Thank you for giving [place] the opportunity to be involved in your care.  Below is a brief recap of your visit and next steps.  We will plan to see you again in October.  Summary No medication changes today Complete urine study when able I recommend calling your eye doctor to schedule a diabetic eye exam when able Complete urine study when you have time Follow up in October

## 2022-06-26 DIAGNOSIS — E1122 Type 2 diabetes mellitus with diabetic chronic kidney disease: Secondary | ICD-10-CM | POA: Diagnosis not present

## 2022-06-26 DIAGNOSIS — N182 Chronic kidney disease, stage 2 (mild): Secondary | ICD-10-CM | POA: Diagnosis not present

## 2022-06-29 LAB — MICROALBUMIN / CREATININE URINE RATIO
Creatinine, Urine: 132.3 mg/dL
Microalb/Creat Ratio: 6 mg/g creat (ref 0–29)
Microalbumin, Urine: 7.7 ug/mL

## 2022-07-25 DIAGNOSIS — H905 Unspecified sensorineural hearing loss: Secondary | ICD-10-CM | POA: Diagnosis not present

## 2022-07-31 ENCOUNTER — Ambulatory Visit: Payer: Medicare Other | Admitting: Urology

## 2022-07-31 ENCOUNTER — Other Ambulatory Visit: Payer: Self-pay | Admitting: Internal Medicine

## 2022-07-31 ENCOUNTER — Encounter: Payer: Self-pay | Admitting: Urology

## 2022-07-31 VITALS — BP 98/67 | HR 59

## 2022-07-31 DIAGNOSIS — R351 Nocturia: Secondary | ICD-10-CM | POA: Diagnosis not present

## 2022-07-31 DIAGNOSIS — N401 Enlarged prostate with lower urinary tract symptoms: Secondary | ICD-10-CM | POA: Diagnosis not present

## 2022-07-31 DIAGNOSIS — R350 Frequency of micturition: Secondary | ICD-10-CM

## 2022-07-31 DIAGNOSIS — Z8639 Personal history of other endocrine, nutritional and metabolic disease: Secondary | ICD-10-CM

## 2022-07-31 DIAGNOSIS — N400 Enlarged prostate without lower urinary tract symptoms: Secondary | ICD-10-CM

## 2022-07-31 LAB — URINALYSIS, ROUTINE W REFLEX MICROSCOPIC
Bilirubin, UA: NEGATIVE
Ketones, UA: NEGATIVE
Leukocytes,UA: NEGATIVE
Nitrite, UA: NEGATIVE
Protein,UA: NEGATIVE
RBC, UA: NEGATIVE
Specific Gravity, UA: 1.025 (ref 1.005–1.030)
Urobilinogen, Ur: 0.2 mg/dL (ref 0.2–1.0)
pH, UA: 5.5 (ref 5.0–7.5)

## 2022-07-31 MED ORDER — TESTOSTERONE CYPIONATE 200 MG/ML IM SOLN: 100.0000 mg | INTRAMUSCULAR | 1 refills | Status: DC

## 2022-07-31 MED ORDER — TADALAFIL 20 MG PO TABS: 20.0000 mg | ORAL_TABLET | Freq: Every day | ORAL | 5 refills | Status: DC | PRN

## 2022-07-31 NOTE — Progress Notes (Signed)
post void residual=13 

## 2022-07-31 NOTE — Progress Notes (Signed)
07/31/2022 8:44 AM   Tyrone Sage November 03, 1955 [id number]  Referring provider: Billie Lade, MD [address] [place],  [place] [address]   Hypogonadism   HPI: Mr Justin Carroll is a 67yo here for evaluation of hypogonadism. He was a patient of Dr Karilyn Cota and he was on IM testosterone 100mg  twice weekly. He has been off testosterone for 6 months. IPSS 5 QOL 2 on flomax 0.4mg  daily. Since his MI 2 years ago he has noted difficulty getting and maintaining an erection. He has decreased libido.    PMH: Past Medical History:  Diagnosis Date   Allergy Lifetime   Mostly pollen   Anxiety Not sure   Ativan prn. A few times a month   CKD (chronic kidney disease) stage 2, GFR 60-89 ml/min 05/07/2022   Depression    Encounter for general adult medical examination with abnormal findings 05/07/2022   GERD (gastroesophageal reflux disease) 5 years   Myocardial infarction (HCC) 10/17/2020   Stent   Sinus infection    yearly   Sleep apnea Diagnosed 2022   CPAP   Substance abuse (HCC) 40+ years ago   Sober since 11/19/1985    Surgical History: Past Surgical History:  Procedure Laterality Date   ADENOIDECTOMY     CORONARY IMAGING/OCT N/A 10/18/2020   Procedure: INTRAVASCULAR IMAGING/OCT;  Surgeon: Iran Ouch, MD;  Location: [facility];  Service: Cardiovascular;  Laterality: N/A;   CORONARY STENT INTERVENTION N/A 10/18/2020   Procedure: CORONARY STENT INTERVENTION;  Surgeon: Iran Ouch, MD;  Location: [facility];  Service: Cardiovascular;  Laterality: N/A;   LEFT HEART CATH AND CORONARY ANGIOGRAPHY N/A 10/18/2020   Procedure: LEFT HEART CATH AND CORONARY ANGIOGRAPHY;  Surgeon: Iran Ouch, MD;  [facility]: [facility];  Service: Cardiovascular;  Laterality: N/A;   NASAL SEPTUM SURGERY     SINOSCOPY     TONSILLECTOMY     age 51    Home Medications:  Allergies as of 07/31/2022   No Known Allergies      Medication List        Accurate as  of July 31, 2022  8:44 AM. If you have any questions, ask your nurse or doctor.          atomoxetine 40 MG capsule Commonly known as: Strattera Take 1 capsule (40 mg total) by mouth daily.   atorvastatin 80 MG tablet Commonly known as: LIPITOR TAKE ONE (1) TABLET BY MOUTH EVERY DAY   Azelastine HCl 137 MCG/SPRAY Soln USE 2 SPRAY(S) IN EACH NOSTRIL TWICE DAILY AS NEEDED FOR RHINITIS   buPROPion 300 MG 24 hr tablet Commonly known as: WELLBUTRIN XL Take 1 tablet (300 mg total) by mouth daily.   carvedilol 12.5 MG tablet Commonly known as: COREG TAKE ONE TABLET BY MOUTH TWICE A DAY WITH A MEAL   clopidogrel 75 MG tablet Commonly known as: PLAVIX Take 1 tablet (75 mg total) by mouth daily.   dapagliflozin propanediol 10 MG Tabs tablet Commonly known as: FARXIGA Take 1 tablet (10 mg total) by mouth daily.   Entresto 24-26 MG Generic drug: sacubitril-valsartan Take 1 tablet by mouth 2 (two) times daily.   levocetirizine 5 MG tablet Commonly known as: XYZAL Take 5 mg by mouth every evening.   LORazepam 0.5 MG tablet Commonly known as: ATIVAN Take 1 tablet (0.5 mg total) by mouth daily as needed for anxiety.   mometasone 50 MCG/ACT nasal spray Commonly known as: NASONEX  Place 1 spray into the nose daily.   nitroGLYCERIN 0.4 MG SL tablet Commonly known as: NITROSTAT DISSOLVE ONE TABLET UNDER THE TONGUE EVERY 5 MINUTES AS NEEDED FOR CHEST PAIN.  DO NOT EXCEED A TOTAL OF 3 DOSES IN 15 MINUTES   pantoprazole 40 MG tablet Commonly known as: PROTONIX Take 1 tablet (40 mg total) by mouth daily.   Repatha SureClick 140 MG/ML [place] Generic drug: Evolocumab Inject 140 mg into the skin every 14 (fourteen) days.   spironolactone 25 MG tablet Commonly known as: ALDACTONE Take 1 tablet (25 mg total) by mouth at bedtime.   tamsulosin 0.4 MG Caps capsule Commonly known as: FLOMAX TAKE 1 CAPSULE BY MOUTH ONCE DAILY AFTER SUPPER   testosterone cypionate 200 MG/ML  injection Commonly known as: DEPOTESTOSTERONE CYPIONATE INJECT 0.5 MLS INTO THE MUSCLE TWICE A WEEK.   traZODone 100 MG tablet Commonly known as: DESYREL Take 1 tablet (100 mg total) by mouth at bedtime.   triamcinolone cream 0.1 % Commonly known as: KENALOG Apply 1 Application topically as needed.   VITAMIN D-3 PO Take 2,000 Units by mouth daily.        Allergies: No Known Allergies  Family History: Family History  Problem Relation Age of Onset   Stroke Mother    Alcohol abuse Father    Heart disease Father    Allergic rhinitis Brother    Alcohol abuse Brother    Anxiety disorder Brother    Depression Brother    Depression Brother    Drug abuse Brother    Stroke Brother    Heart disease Paternal Uncle    Heart disease Paternal Uncle    Colon cancer Neg Hx    Pancreatic cancer Neg Hx    Rectal cancer Neg Hx    Stomach cancer Neg Hx     Social History:  reports that he quit smoking about 9 years ago. His smoking use included cigarettes. He started smoking about 29 years ago. He has a 15.00 pack-year smoking history. He has never used smokeless tobacco. He reports that he does not currently use alcohol. He reports that he does not use drugs.  ROS: All other review of systems were reviewed and are negative except what is noted above in HPI  Physical Exam: BP 98/67   Pulse (!) 59   Constitutional:  Alert and oriented, No acute distress. HEENT: [place] AT, moist mucus membranes.  Trachea midline, no masses. Cardiovascular: No clubbing, cyanosis, or edema. Respiratory: Normal respiratory effort, no increased work of breathing. GI: Abdomen is soft, nontender, nondistended, no abdominal masses GU: No CVA tenderness.  Lymph: No cervical or inguinal lymphadenopathy. Skin: No rashes, bruises or suspicious lesions. Neurologic: Grossly intact, no focal deficits, moving all 4 extremities. Psychiatric: Normal mood and affect.  Laboratory Data: Lab Results  Component Value  Date   WBC 6.8 05/07/2022   HGB 18.1 (H) 05/07/2022   HCT 53.5 (H) 05/07/2022   MCV 93 05/07/2022   PLT 165 05/07/2022    Lab Results  Component Value Date   CREATININE 1.12 05/07/2022    No results found for: "PSA"  Lab Results  Component Value Date   TESTOSTERONE 304 05/07/2022    Lab Results  Component Value Date   HGBA1C 6.7 (H) 05/07/2022    Urinalysis    Component Value Date/Time   COLORURINE DARK YELLOW 05/17/2020 0000   APPEARANCEUR CLEAR 05/17/2020 0000   LABSPEC 1.025 05/17/2020 0000   PHURINE < OR = 5.0 05/17/2020 0000  GLUCOSEU NEGATIVE 05/17/2020 0000   HGBUR NEGATIVE 05/17/2020 0000   KETONESUR 1+ (A) 05/17/2020 0000   PROTEINUR NEGATIVE 05/17/2020 0000    Lab Results  Component Value Date   LABMICR 7.7 06/26/2022   BACTERIA NONE SEEN 05/17/2020    Pertinent Imaging:  No results found for this or any previous visit.  No results found for this or any previous visit.  No results found for this or any previous visit.  No results found for this or any previous visit.  No results found for this or any previous visit.  No valid procedures specified. No results found for this or any previous visit.  No results found for this or any previous visit.   Assessment & Plan:    1. History of hypogonadism -testosterone labs today -restart testosterone 100mg  weekly -followup 3 months with testosterone labs  2. Benign prostatic hyperplasia with urinary frequency -continue flomax 0.4mg  daily  3. Nocturia -continue flomax 0.4mg     No follow-ups on file.  Wilkie Aye, MD  [facility] [place]

## 2022-07-31 NOTE — Patient Instructions (Signed)

## 2022-08-02 LAB — COMPREHENSIVE METABOLIC PANEL
ALT: 14 IU/L (ref 0–44)
AST: 18 IU/L (ref 0–40)
Albumin: 4.2 g/dL (ref 3.9–4.9)
Alkaline Phosphatase: 70 IU/L (ref 44–121)
BUN/Creatinine Ratio: 20 (ref 10–24)
BUN: 21 mg/dL (ref 8–27)
Bilirubin Total: 0.5 mg/dL (ref 0.0–1.2)
CO2: 23 mmol/L (ref 20–29)
Calcium: 9.1 mg/dL (ref 8.6–10.2)
Chloride: 103 mmol/L (ref 96–106)
Creatinine, Ser: 1.03 mg/dL (ref 0.76–1.27)
Globulin, Total: 1.9 g/dL (ref 1.5–4.5)
Glucose: 100 mg/dL — ABNORMAL HIGH (ref 70–99)
Potassium: 4.4 mmol/L (ref 3.5–5.2)
Sodium: 138 mmol/L (ref 134–144)
Total Protein: 6.1 g/dL (ref 6.0–8.5)
eGFR: 79 mL/min/{1.73_m2} (ref >59)

## 2022-08-02 LAB — CBC
Hematocrit: 42.7 % (ref 37.5–51.0)
Hemoglobin: 13.7 g/dL (ref 13.0–17.7)
MCH: 31.5 pg (ref 26.6–33.0)
MCHC: 32.1 g/dL (ref 31.5–35.7)
MCV: 98 fL — ABNORMAL HIGH (ref 79–97)
Platelets: 195 10*3/uL (ref 150–450)
RBC: 4.35 x10E6/uL (ref 4.14–5.80)
RDW: 13.7 % (ref 11.6–15.4)
WBC: 6.2 10*3/uL (ref 3.4–10.8)

## 2022-08-02 LAB — TESTOSTERONE,FREE AND TOTAL
Testosterone, Free: 4.4 pg/mL — ABNORMAL LOW (ref 6.6–18.1)
Testosterone: 340 ng/dL (ref 264–916)

## 2022-08-07 ENCOUNTER — Other Ambulatory Visit: Payer: Self-pay | Admitting: Internal Medicine

## 2022-08-07 DIAGNOSIS — G4733 Obstructive sleep apnea (adult) (pediatric): Secondary | ICD-10-CM | POA: Diagnosis not present

## 2022-08-07 DIAGNOSIS — K219 Gastro-esophageal reflux disease without esophagitis: Secondary | ICD-10-CM

## 2022-08-12 ENCOUNTER — Encounter: Payer: Self-pay | Admitting: Urology

## 2022-08-13 ENCOUNTER — Other Ambulatory Visit: Payer: Self-pay | Admitting: Urology

## 2022-08-13 MED ORDER — TESTOSTERONE CYPIONATE 200 MG/ML IM SOLN: 100.0000 mg | INTRAMUSCULAR | 1 refills | Status: DC

## 2022-08-19 ENCOUNTER — Ambulatory Visit: Payer: Medicare Other | Admitting: Internal Medicine

## 2022-08-30 ENCOUNTER — Other Ambulatory Visit: Payer: Self-pay | Admitting: Cardiology

## 2022-09-03 ENCOUNTER — Other Ambulatory Visit: Payer: Self-pay | Admitting: Internal Medicine

## 2022-09-03 DIAGNOSIS — K219 Gastro-esophageal reflux disease without esophagitis: Secondary | ICD-10-CM

## 2022-09-10 ENCOUNTER — Encounter (HOSPITAL_COMMUNITY): Payer: Self-pay

## 2022-09-10 ENCOUNTER — Emergency Department (HOSPITAL_COMMUNITY)
Admission: EM | Admit: 2022-09-10 | Discharge: 2022-09-10 | Disposition: A | Discharge status: Home / Self Care | Payer: Medicare Other | Attending: Emergency Medicine | Admitting: Emergency Medicine

## 2022-09-10 ENCOUNTER — Encounter: Payer: Self-pay | Admitting: Cardiovascular Disease

## 2022-09-10 ENCOUNTER — Emergency Department (HOSPITAL_COMMUNITY): Payer: Medicare Other

## 2022-09-10 ENCOUNTER — Other Ambulatory Visit: Payer: Self-pay

## 2022-09-10 DIAGNOSIS — R5383 Other fatigue: Secondary | ICD-10-CM | POA: Insufficient documentation

## 2022-09-10 DIAGNOSIS — R918 Other nonspecific abnormal finding of lung field: Secondary | ICD-10-CM | POA: Diagnosis not present

## 2022-09-10 DIAGNOSIS — R079 Chest pain, unspecified: Secondary | ICD-10-CM

## 2022-09-10 DIAGNOSIS — I519 Heart disease, unspecified: Secondary | ICD-10-CM

## 2022-09-10 DIAGNOSIS — Z79899 Other long term (current) drug therapy: Secondary | ICD-10-CM | POA: Insufficient documentation

## 2022-09-10 DIAGNOSIS — Z7902 Long term (current) use of antithrombotics/antiplatelets: Secondary | ICD-10-CM | POA: Diagnosis not present

## 2022-09-10 DIAGNOSIS — R0789 Other chest pain: Secondary | ICD-10-CM | POA: Diagnosis not present

## 2022-09-10 DIAGNOSIS — E782 Mixed hyperlipidemia: Secondary | ICD-10-CM

## 2022-09-10 DIAGNOSIS — I2510 Atherosclerotic heart disease of native coronary artery without angina pectoris: Secondary | ICD-10-CM

## 2022-09-10 DIAGNOSIS — N189 Chronic kidney disease, unspecified: Secondary | ICD-10-CM | POA: Diagnosis not present

## 2022-09-10 LAB — CBC
HCT: 47.6 % (ref 39.0–52.0)
Hemoglobin: 14.9 g/dL (ref 13.0–17.0)
MCH: 32.3 pg (ref 26.0–34.0)
MCHC: 31.3 g/dL (ref 30.0–36.0)
MCV: 103 fL — ABNORMAL HIGH (ref 80.0–100.0)
Platelets: 151 10*3/uL (ref 150–400)
RBC: 4.62 MIL/uL (ref 4.22–5.81)
RDW: 12.4 % (ref 11.5–15.5)
WBC: 6.1 10*3/uL (ref 4.0–10.5)
nRBC: 0 % (ref 0.0–0.2)

## 2022-09-10 LAB — TROPONIN I (HIGH SENSITIVITY)
Troponin I (High Sensitivity): 3 ng/L (ref <18)
Troponin I (High Sensitivity): 3 ng/L (ref <18)

## 2022-09-10 LAB — BASIC METABOLIC PANEL
Anion gap: 13 (ref 5–15)
BUN: 17 mg/dL (ref 8–23)
CO2: 24 mmol/L (ref 22–32)
Calcium: 8.9 mg/dL (ref 8.9–10.3)
Chloride: 99 mmol/L (ref 98–111)
Creatinine, Ser: 1.17 mg/dL (ref 0.61–1.24)
GFR, Estimated: >60 mL/min (ref >60)
Glucose, Bld: 113 mg/dL — ABNORMAL HIGH (ref 70–99)
Potassium: 4.4 mmol/L (ref 3.5–5.1)
Sodium: 136 mmol/L (ref 135–145)

## 2022-09-10 NOTE — ED Provider Notes (Signed)
Care assumed from this provider at shift change.  See note for full HPI.  Patient 67 year old here for evaluation of chest pain.  Occurred while at rest.  No associated shortness of breath, diaphoresis, nausea, vomiting, radiation of pain.  Took nitroglycerin he has been chest pain-free here in the emergency department.  Some chronic fatigue at baseline per previous provider.  First troponin negative, plan on follow-up on delta troponin.  If negative can DC home. Physical Exam  BP (!) 100/59   Pulse 72   Temp 97.6 F (36.4 C) (Oral)   Resp 20   Ht 5\' 6"  (1.676 m)   Wt 77.6 kg   SpO2 97%   BMI 27.60 kg/m   Physical Exam Vitals and nursing note reviewed.  Constitutional:      General: He is not in acute distress.    Appearance: He is well-developed. He is not ill-appearing or diaphoretic.  HENT:     Head: Atraumatic.  Cardiovascular:     Rate and Rhythm: Normal rate and regular rhythm.  Pulmonary:     Effort: Pulmonary effort is normal. No respiratory distress.  Abdominal:     General: There is no distension.     Palpations: Abdomen is soft.  Musculoskeletal:        General: Normal range of motion.     Cervical back: Normal range of motion.  Skin:    General: Skin is warm and dry.  Neurological:     General: No focal deficit present.     Mental Status: He is alert.    Procedures  Procedures Labs Reviewed  BASIC METABOLIC PANEL - Abnormal; Notable for the following components:      Result Value   Glucose, Bld 113 (*)    All other components within normal limits  CBC - Abnormal; Notable for the following components:   MCV 103.0 (*)    All other components within normal limits  TROPONIN I (HIGH SENSITIVITY)  TROPONIN I (HIGH SENSITIVITY)   DG Chest 2 View  Result Date: 09/10/2022 CLINICAL DATA:  Left-sided chest pressure EXAM: CHEST - 2 VIEW COMPARISON:  Chest radiograph dated 10/17/2020 FINDINGS: Normal lung volumes. Left basilar patchy opacities, unchanged. No  pleural effusion or pneumothorax. The heart size and mediastinal contours are within normal limits. No acute osseous abnormality. IMPRESSION: Unchanged left basilar patchy opacities, likely atelectasis/scarring. Electronically Signed   By: Agustin Cree M.D.   On: 09/10/2022 14:34    ED Course / MDM   Clinical Course as of 09/10/22 1721  Tue Sep 10, 2022  1511 NSTEMI in 22 with stent, noon CP, relieved with 1 nitroglycerin. No pain currently. Delta Trop and d/c if WNL. [BH]    Clinical Course User Index [BH] ,  A, PA-C   Care assumed from this provider at shift change.  See note for full HPI.  Patient 67 year old here for evaluation of chest pain.  Occurred while at rest.  No associated shortness of breath, diaphoresis, nausea, vomiting, radiation of pain.  Took nitroglycerin he has been chest pain-free here in the emergency department.  Some chronic fatigue at baseline per previous provider.  First troponin negative, plan on follow-up on delta troponin.  If negative can DC home.  Labs and imaging personally viewed and interpreted:  Delta troponin flat  Discussed with patient and family in room.  Denies any current symptoms.  Will DC home with symptomatic management.  They do state they have follow-up appointment Dr. Allyson Sabal ([facility]) on 8/23.  I encouraged him to call them tomorrow and let them know he was seen here in case he would like to be seen sooner.  I discussed return precautions and to return if his symptoms worsen or return.  At this time I low suspicion for acute ACS, PE, dissection, unstable angina, myocarditis, pericarditis, pneumothorax, infectious process, edema.  The patient has been appropriately medically screened and/or stabilized in the ED. I have low suspicion for any other emergent medical condition which would require further screening, evaluation or treatment in the ED or require inpatient management.  Patient is hemodynamically stable and in no acute  distress.  Patient able to ambulate in department prior to ED.  Evaluation does not show acute pathology that would require ongoing or additional emergent interventions while in the emergency department or further inpatient treatment.  I have discussed the diagnosis with the patient and answered all questions.  Pain is been managed while in the emergency department and patient has no further complaints prior to discharge.  Patient is comfortable with plan discussed in room and is stable for discharge at this time.  I have discussed strict return precautions for returning to the emergency department.  Patient was encouraged to follow-up with PCP/specialist refer to at discharge.    Medical Decision Making Amount and/or Complexity of Data Reviewed Independent Historian: spouse External Data Reviewed: labs, radiology, ECG and notes. Labs: ordered. Decision-making details documented in ED Course. Radiology: ordered and independent interpretation performed. Decision-making details documented in ED Course. ECG/medicine tests: ordered and independent interpretation performed. Decision-making details documented in ED Course.  Risk OTC drugs. Prescription drug management. Diagnosis or treatment significantly limited by social determinants of health.     ,  A, PA-C 09/10/22 1721    Lonell Grandchild, MD 09/19/22 [phone]

## 2022-09-10 NOTE — ED Notes (Signed)
Pt is a&ox4, pwd. Pt came from home via POV with wife for left side chest pressure. Pt did take a nitroglycerin at home which pt states did decrease the pain. Pt has been changed into a gown, attached to monitor/vitals. Side rails up x 2, call light with patient. Warm blanket provided and wife at the bedside.

## 2022-09-10 NOTE — Discharge Instructions (Addendum)
It was a pleasure taking care of you here in the emergency department today  Your workup is reassuring  Please call Dr. Hazle Coca office tomorrow and let them know you were seen here today  Return for any new or worsening symptoms such as worsening chest pain, shortness of breath, numbness, weakness.

## 2022-09-10 NOTE — ED Triage Notes (Signed)
Reports chest pain that started 1 hr ago similar to his last heart attack.  Reports it goes into arm.  Denies sob n/v or sweating.  Patient did take 1 nitroglycerin at 1220.

## 2022-09-10 NOTE — ED Provider Notes (Signed)
[place] EMERGENCY DEPARTMENT AT [facility] Provider Note   CSN: [id number] Arrival date & time: 09/10/22  1307     History  Chief Complaint  Patient presents with   Chest Pain    Justin Carroll is a 67 y.o. male with history of depression, MI in 2022 s/p stent, anxiety, GERD, CKD, sleep apnea on CPAP, who presents the emergency department complaining of chest pain.  Patient had sudden onset of left-sided chest pain/pressure around 12 PM this afternoon that radiated into his left arm.  He states that he was sitting at his desk when this happened.  No associated shortness of breath, diaphoresis, nausea or vomiting.  He did take 1 nitroglycerin at around 1230, and states now his pain is entirely resolved.  Has been feeling increasingly fatigued for several months, especially with exertion.  Denies any shortness of breath, peripheral swelling.  Denies any recent illness.   Chest Pain Associated symptoms: fatigue        Home Medications Prior to Admission medications   Medication Sig Start Date End Date Taking? Authorizing Provider  atomoxetine (STRATTERA) 40 MG capsule Take 1 capsule (40 mg total) by mouth daily. 03/19/22   Dohmeier, Porfirio Mylar, MD  atorvastatin (LIPITOR) 80 MG tablet TAKE ONE (1) TABLET BY MOUTH EVERY DAY 08/30/22   Runell Gess, MD  Azelastine HCl 137 MCG/SPRAY SOLN USE 2 SPRAY(S) IN EACH NOSTRIL TWICE DAILY AS NEEDED FOR RHINITIS 09/21/20   Alfonse Spruce, MD  buPROPion (WELLBUTRIN XL) 300 MG 24 hr tablet Take 1 tablet (300 mg total) by mouth daily. 05/07/22   Billie Lade, MD  carvedilol (COREG) 12.5 MG tablet TAKE ONE TABLET BY MOUTH TWICE A DAY WITH A MEAL 01/14/22   Laurey Morale, MD  Cholecalciferol (VITAMIN D-3 PO) Take 2,000 Units by mouth daily.    [provider]  clopidogrel (PLAVIX) 75 MG tablet Take 1 tablet (75 mg total) by mouth daily. 02/18/22   Laurey Morale, MD  dapagliflozin propanediol (FARXIGA) 10 MG TABS tablet  Take 1 tablet (10 mg total) by mouth daily. 01/10/22   Laurey Morale, MD  Evolocumab (REPATHA SURECLICK) 140 MG/ML SOAJ Inject 140 mg into the skin every 14 (fourteen) days. 04/05/22   Laurey Morale, MD  levocetirizine (XYZAL) 5 MG tablet Take 5 mg by mouth every evening.    [provider]  LORazepam (ATIVAN) 0.5 MG tablet Take 1 tablet (0.5 mg total) by mouth daily as needed for anxiety. 05/07/22   Billie Lade, MD  mometasone (NASONEX) 50 MCG/ACT nasal spray Place 1 spray into the nose daily. 12/08/19   Alfonse Spruce, MD  nitroGLYCERIN (NITROSTAT) 0.4 MG SL tablet DISSOLVE ONE TABLET UNDER THE TONGUE EVERY 5 MINUTES AS NEEDED FOR CHEST PAIN.  DO NOT EXCEED A TOTAL OF 3 DOSES IN 15 MINUTES 01/08/21   Runell Gess, MD  pantoprazole (PROTONIX) 40 MG tablet TAKE ONE TABLET (40MG  TOTAL) BY MOUTH DAILY 09/03/22   Billie Lade, MD  sacubitril-valsartan (ENTRESTO) 24-26 MG Take 1 tablet by mouth 2 (two) times daily. 01/10/22   Laurey Morale, MD  spironolactone (ALDACTONE) 25 MG tablet Take 1 tablet (25 mg total) by mouth at bedtime. 08/30/21   Clegg, Amy D, NP  tadalafil (CIALIS) 20 MG tablet Take 1 tablet (20 mg total) by mouth daily as needed. 07/31/22   McKenzie, Mardene Celeste, MD  tamsulosin (FLOMAX) 0.4 MG CAPS capsule TAKE ONE (1) TABLET BY  MOUTH EVERY DAY AFTER SUPPER 08/01/22   Billie Lade, MD  testosterone cypionate (DEPOTESTOSTERONE CYPIONATE) 200 MG/ML injection Inject 0.5 mLs (100 mg total) into the muscle once a week. 08/13/22   McKenzie, Mardene Celeste, MD  traZODone (DESYREL) 100 MG tablet Take 1 tablet (100 mg total) by mouth at bedtime. 05/07/22   Billie Lade, MD  triamcinolone cream (KENALOG) 0.1 % Apply 1 Application topically as needed.    [provider]      Allergies    Patient has no known allergies.    Review of Systems   Review of Systems  Constitutional:  Positive for fatigue.  Cardiovascular:  Positive for chest pain.  All other systems  reviewed and are negative.   Physical Exam Updated Vital Signs BP 97/72   Pulse 76   Temp 97.6 F (36.4 C) (Oral)   Resp (!) 21   Ht 5\' 6"  (1.676 m)   Wt 77.6 kg   SpO2 97%   BMI 27.60 kg/m  Physical Exam Vitals and nursing note reviewed.  Constitutional:      Appearance: Normal appearance.  HENT:     Head: Normocephalic and atraumatic.  Eyes:     Conjunctiva/sclera: Conjunctivae normal.  Cardiovascular:     Rate and Rhythm: Normal rate and regular rhythm.  Pulmonary:     Effort: Pulmonary effort is normal. No respiratory distress.     Breath sounds: Normal breath sounds.  Abdominal:     General: There is no distension.     Palpations: Abdomen is soft.     Tenderness: There is no abdominal tenderness.  Musculoskeletal:     Right lower leg: No edema.     Left lower leg: No edema.  Skin:    General: Skin is warm and dry.  Neurological:     General: No focal deficit present.     Mental Status: He is alert.     ED Results / Procedures / Treatments   Labs (all labs ordered are listed, but only abnormal results are displayed) Labs Reviewed  BASIC METABOLIC PANEL - Abnormal; Notable for the following components:      Result Value   Glucose, Bld 113 (*)    All other components within normal limits  CBC - Abnormal; Notable for the following components:   MCV 103.0 (*)    All other components within normal limits  TROPONIN I (HIGH SENSITIVITY)  TROPONIN I (HIGH SENSITIVITY)    EKG EKG Interpretation Date/Time:  Tuesday September 10 2022 13:56:59 EDT Ventricular Rate:  71 PR Interval:  133 QRS Duration:  103 QT Interval:  418 QTC Calculation: 438 R Axis:   -12  Text Interpretation: Sinus rhythm Ventricular bigeminy Anteroseptal infarct, age indeterminate Confirmed by Vonita Moss [id number]) on 09/10/2022 2:42:01 PM  Radiology DG Chest 2 View  Result Date: 09/10/2022 CLINICAL DATA:  Left-sided chest pressure EXAM: CHEST - 2 VIEW COMPARISON:  Chest radiograph  dated 10/17/2020 FINDINGS: Normal lung volumes. Left basilar patchy opacities, unchanged. No pleural effusion or pneumothorax. The heart size and mediastinal contours are within normal limits. No acute osseous abnormality. IMPRESSION: Unchanged left basilar patchy opacities, likely atelectasis/scarring. Electronically Signed   By: Agustin Cree M.D.   On: 09/10/2022 14:34    Procedures Procedures    Medications Ordered in ED Medications - No data to display  ED Course/ Medical Decision Making/ A&P  Medical Decision Making Amount and/or Complexity of Data Reviewed Labs: ordered. Radiology: ordered.   This patient is a 67 y.o. male  who presents to the ED for concern of chest pain starting 12pm today.   Differential diagnoses prior to evaluation: The emergent differential diagnosis includes, but is not limited to,  ACS, pericarditis, myocarditis, aortic dissection, PE, pneumothorax, esophageal spasm or rupture, chronic angina, pneumonia, bronchitis, GERD, reflux/PUD, biliary disease, pancreatitis, costochondritis, anxiety. This is not an exhaustive differential.   Past Medical History / Co-morbidities / Social History: depression, MI in 2022 s/p stent, anxiety, GERD, CKD, sleep apnea on CPAP  Additional history: Chart reviewed. Pertinent results include: Cardiac MRI from Feb 2023 with LVEF 47%, Reviewed admission records from Sept 2022 where patient was admitted with NSTEMI and ischemic cardiomyopathy, treated with IV heparin and stent to mid LCX.   Physical Exam: Physical exam performed. The pertinent findings include: Normal vital signs, no acute distress.  Heart regular rate and rhythm, lung sounds clear.  No peripheral edema.  Lab Tests/Imaging studies: I personally interpreted labs/imaging and the pertinent results include: No leukocytosis, normal hemoglobin. BMP unremarkable. Initial troponin normal at 3.   CXR with no acute cardiopulmonary  abnormalities. I agree with the radiologist interpretation.  Cardiac monitoring: EKG obtained and interpreted by myself and attending physician which shows: Normal sinus rhythm, no acute ischemic changes   Disposition: Patient discussed and care transferred to [facility] PA-C at shift change. Please see his/her note for further details regarding further ED course and disposition. Plan at time of handoff is follow up on delta troponin. Anticipate if normal, patient could benefit from cardiology standpoint as patient is high risk with history of NSTEMI with prior stent.   Final Clinical Impression(s) / ED Diagnoses Final diagnoses:  Chest pain, unspecified type    Rx / DC Orders ED Discharge Orders     None      Portions of this report may have been transcribed using voice recognition software. Every effort was made to ensure accuracy; however, inadvertent computerized transcription errors may be present.    Jeanella Flattery 09/10/22 1508    Rondel Baton, MD 09/12/22 1349

## 2022-09-11 ENCOUNTER — Encounter: Payer: Self-pay | Admitting: Family Medicine

## 2022-09-16 ENCOUNTER — Encounter: Payer: Self-pay | Admitting: Cardiovascular Disease

## 2022-09-16 ENCOUNTER — Ambulatory Visit (HOSPITAL_COMMUNITY): Payer: Medicare Other | Attending: Cardiovascular Disease

## 2022-09-16 DIAGNOSIS — E782 Mixed hyperlipidemia: Secondary | ICD-10-CM | POA: Diagnosis not present

## 2022-09-16 DIAGNOSIS — I2510 Atherosclerotic heart disease of native coronary artery without angina pectoris: Secondary | ICD-10-CM | POA: Insufficient documentation

## 2022-09-16 DIAGNOSIS — I519 Heart disease, unspecified: Secondary | ICD-10-CM | POA: Diagnosis not present

## 2022-09-16 LAB — ECHOCARDIOGRAM COMPLETE
Area-P 1/2: 3.17 cm2
Calc EF: 44.2 %
S' Lateral: 2.7 cm
Single Plane A2C EF: 42.8 %
Single Plane A4C EF: 47.7 %

## 2022-09-17 ENCOUNTER — Telehealth: Payer: Self-pay

## 2022-09-17 ENCOUNTER — Ambulatory Visit: Payer: Medicare Other | Admitting: Family Medicine

## 2022-09-17 NOTE — Telephone Encounter (Signed)
Transition Care Management Unsuccessful Follow-up Telephone Call  Date of discharge and from where:  [facility]  Attempts:  1st Attempt  Reason for unsuccessful TCM follow-up call:  No answer/busy   Lenard Forth [facility] Guide, [facility] [phone] [address] [place], [place], [place] [address] Phone: [phone] Email: [email or web address]

## 2022-09-18 ENCOUNTER — Telehealth: Payer: Self-pay

## 2022-09-18 NOTE — Telephone Encounter (Signed)
Transition Care Management Unsuccessful Follow-up Telephone Call  Date of discharge and from where:  [facility]  Attempts:  2nd Attempt  Reason for unsuccessful TCM follow-up call:  No answer/busy   Lenard Forth [facility] Guide, [facility] [phone] [address] [place], [place], [place] [address] Phone: [phone] Email: [email or web address]

## 2022-09-23 ENCOUNTER — Other Ambulatory Visit (HOSPITAL_COMMUNITY): Payer: Self-pay | Admitting: Adult Health

## 2022-09-25 ENCOUNTER — Other Ambulatory Visit: Payer: Self-pay | Admitting: Internal Medicine

## 2022-09-25 DIAGNOSIS — F418 Other specified anxiety disorders: Secondary | ICD-10-CM

## 2022-09-27 ENCOUNTER — Other Ambulatory Visit: Payer: Self-pay | Admitting: Cardiovascular Disease

## 2022-09-27 ENCOUNTER — Encounter: Payer: Self-pay | Admitting: Cardiovascular Disease

## 2022-09-27 ENCOUNTER — Ambulatory Visit: Payer: Medicare Other | Attending: Cardiovascular Disease | Admitting: Cardiovascular Disease

## 2022-09-27 VITALS — BP 112/72 | HR 67 | Ht 67.0 in | Wt 171.4 lb

## 2022-09-27 DIAGNOSIS — E782 Mixed hyperlipidemia: Secondary | ICD-10-CM | POA: Diagnosis not present

## 2022-09-27 DIAGNOSIS — I214 Non-ST elevation (NSTEMI) myocardial infarction: Secondary | ICD-10-CM

## 2022-09-27 DIAGNOSIS — R079 Chest pain, unspecified: Secondary | ICD-10-CM

## 2022-09-27 DIAGNOSIS — I519 Heart disease, unspecified: Secondary | ICD-10-CM | POA: Diagnosis not present

## 2022-09-27 DIAGNOSIS — I2510 Atherosclerotic heart disease of native coronary artery without angina pectoris: Secondary | ICD-10-CM

## 2022-09-27 MED ORDER — NITROGLYCERIN 0.4 MG SL SUBL: 0.4000 mg | SUBLINGUAL_TABLET | SUBLINGUAL | 3 refills | Status: DC | PRN

## 2022-09-27 NOTE — Progress Notes (Signed)
09/27/2022 [name]   21-Dec-1955  [id number]  Primary Physician Billie Lade, MD Primary Cardiologist: Runell Gess MD Nicholes Calamity, [place]  HPI:  Justin Carroll is a 67 y.o. mildly overweight married Caucasian male father of 7 children (2 of whom were adopted) grandfather 2 grandchildren, who I last saw in the hospital back in September 2022.  Since that time he has been seen by Dr. Shirlee Latch in the advanced heart failure clinic for medication optimization.  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  He is currently in the process of trying to pare this down.  I last saw him in the office 04/02/2022.  He is accompanied by his wife [name] today.  His cardiac risk factor include 30 pack years tobacco abuse having quit 9 years ago, treated hypertension and hyperlipidemia.  His father did die of a myocardial infarction at age 47.  He had a non-STEMI September 2022 and had PCI drug-eluting stenting of the AV groove circumflex by Dr. Kirke Corin.  His medications for ischemic cardiomyopathy have been optimized by Dr. Shirlee Latch.  His most recent EF by cardiac MRI was 51%.  Since I saw him in the office 6 months ago he was seen in the ER on 09/10/2022 with chest pain.  Troponins were negative.  He says he felt fatigued recently and the pain will not like his non-STEMI pain still was worrisome with left chest heaviness and some left upper extremity radiation improved with sublingual nitroglycerin.   Current Meds  Medication Sig   atomoxetine (STRATTERA) 40 MG capsule Take 1 capsule (40 mg total) by mouth daily.   atorvastatin (LIPITOR) 80 MG tablet TAKE ONE (1) TABLET BY MOUTH EVERY DAY   Azelastine HCl 137 MCG/SPRAY SOLN USE 2 SPRAY(S) IN EACH NOSTRIL TWICE DAILY AS NEEDED FOR RHINITIS   buPROPion (WELLBUTRIN XL) 300 MG 24 hr tablet TAKE ONE TABLET (300MG  TOTAL) BY MOUTH DAILY   carvedilol (COREG) 12.5 MG tablet TAKE ONE TABLET BY MOUTH TWICE A DAY WITH A MEAL    Cholecalciferol (VITAMIN D-3 PO) Take 2,000 Units by mouth daily.   clopidogrel (PLAVIX) 75 MG tablet Take 1 tablet (75 mg total) by mouth daily.   dapagliflozin propanediol (FARXIGA) 10 MG TABS tablet Take 1 tablet (10 mg total) by mouth daily.   Evolocumab (REPATHA SURECLICK) 140 MG/ML SOAJ Inject 140 mg into the skin every 14 (fourteen) days.   levocetirizine (XYZAL) 5 MG tablet Take 5 mg by mouth every evening.   LORazepam (ATIVAN) 0.5 MG tablet Take 1 tablet (0.5 mg total) by mouth daily as needed for anxiety.   mometasone (NASONEX) 50 MCG/ACT nasal spray Place 1 spray into the nose daily.   pantoprazole (PROTONIX) 40 MG tablet TAKE ONE TABLET (40MG  TOTAL) BY MOUTH DAILY   sacubitril-valsartan (ENTRESTO) 24-26 MG Take 1 tablet by mouth 2 (two) times daily.   spironolactone (ALDACTONE) 25 MG tablet TAKE ONE TABLET (25 MG TOTAL) BY MOUTH AT BEDTIME.   tamsulosin (FLOMAX) 0.4 MG CAPS capsule TAKE ONE (1) TABLET BY MOUTH EVERY DAY AFTER SUPPER   testosterone cypionate (DEPOTESTOSTERONE CYPIONATE) 200 MG/ML injection Inject 0.5 mLs (100 mg total) into the muscle once a week.   traZODone (DESYREL) 100 MG tablet Take 1 tablet (100 mg total) by mouth at bedtime.   triamcinolone cream (KENALOG) 0.1 % Apply 1 Application topically as needed.   [DISCONTINUED] nitroGLYCERIN (NITROSTAT) 0.4 MG SL tablet DISSOLVE ONE  TABLET UNDER THE TONGUE EVERY 5 MINUTES AS NEEDED FOR CHEST PAIN.  DO NOT EXCEED A TOTAL OF 3 DOSES IN 15 MINUTES     No Known Allergies  Social History   Socioeconomic History   Marital status: Married    Spouse name: Leisure centre manager   Number of children: 7   Years of education: Not on file   Highest education level: Professional school degree (e.g., MD, DDS, DVM, JD)  Occupational History   Occupation: Lawyer    Comment: self employeed  Tobacco Use   Smoking status: Former    Current packs/day: 0.00    Average packs/day: 1.5 packs/day for 20.0 years (30.0 ttl pk-yrs)    Types:  Cigarettes    Start date: 68    Quit date: 02/04/2013    Years since quitting: 9.6   Smokeless tobacco: Never  Vaping Use   Vaping status: Never Used  Substance and Sexual Activity   Alcohol use: Not Currently   Drug use: No   Sexual activity: Yes    Birth control/protection: Surgical  Other Topics Concern   Not on file  Social History Narrative   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   Social Determinants of Health   Financial Resource Strain: Low Risk  (06/06/2022)   Overall Financial Resource Strain (CARDIA)    Difficulty of Paying Living Expenses: Not hard at all  Food Insecurity: No Food Insecurity (06/06/2022)   Hunger Vital Sign    Worried About Running Out of Food in the Last Year: Never true    Ran Out of Food in the Last Year: Never true  Transportation Needs: No Transportation Needs (06/06/2022)   PRAPARE - Administrator, Civil Service (Medical): No    Lack of Transportation (Non-Medical): No  Physical Activity: Insufficiently Active (06/06/2022)   Exercise Vital Sign    Days of Exercise per Week: 3 days    Minutes of Exercise per Session: 20 min  Stress: No Stress Concern Present (06/06/2022)   [employer] of Occupational Health - Occupational Stress Questionnaire    Feeling of Stress : Only a little  Social Connections: Socially Integrated (06/06/2022)   Social Connection and Isolation Panel [NHANES]    Frequency of Communication with Friends and Family: More than three times a week    Frequency of Social Gatherings with Friends and Family: More than three times a week    Attends Religious Services: More than 4 times per year    Active Member of [employer] or Organizations: Yes    Attends Engineer, structural: More than 4 times per year    Marital Status: Married  Catering manager Violence: Not on file     Review of Systems: General: negative for chills, fever, night sweats or weight changes.  Cardiovascular: negative for chest pain, dyspnea on  exertion, edema, orthopnea, palpitations, paroxysmal nocturnal dyspnea or shortness of breath Dermatological: negative for rash Respiratory: negative for cough or wheezing Urologic: negative for hematuria Abdominal: negative for nausea, vomiting, diarrhea, bright red blood per rectum, melena, or hematemesis Neurologic: negative for visual changes, syncope, or dizziness All other systems reviewed and are otherwise negative except as noted above.    Blood pressure 112/72, pulse 67, height 5\' 7"  (1.702 m), weight 171 lb 6.4 oz (77.7 kg), SpO2 96%.  General appearance: alert and no distress Neck: no adenopathy, no carotid bruit, no JVD, supple, symmetrical, trachea midline, and thyroid not enlarged, symmetric, no tenderness/mass/nodules Lungs: clear to auscultation bilaterally Heart: Regular rate  and rhythm without murmurs gallops rubs or clicks Extremities: extremities normal, atraumatic, no cyanosis or edema Pulses: 2+ and symmetric Skin: Skin color, texture, turgor normal. No rashes or lesions Neurologic: Grossly normal  EKG EKG Interpretation Date/Time:  Friday September 27 2022 14:07:12 EDT Ventricular Rate:  61 PR Interval:  160 QRS Duration:  100 QT Interval:  414 QTC Calculation: 416 R Axis:   46  Text Interpretation: Sinus rhythm with occasional Premature ventricular complexes Incomplete right bundle branch block When compared with ECG of 10-Sep-2022 13:56, PREVIOUS ECG IS PRESENT Confirmed by [name] [id number]) on 09/27/2022 2:24:29 PM    ASSESSMENT AND PLAN:   NSTEMI (non-ST elevated myocardial infarction) [facility]) History of non-STEMI September 2022 status post OCT guided AV groove circumflex PCI and drug-eluting stenting by Dr. Kirke Corin 10/18/2020 with a 3.5 mm x 15 mm long Medtronic Onyx resolute drug-eluting stent.  He had no other significant CAD.  He remains on clopidogrel.  He has felt fatigued recently and was seen in the ER with chest pressure and negative troponins.   Plan is for repeat coronary angiography.  Hyperlipemia History of hyperlipidemia on high-dose statin therapy with lipid profile performed 05/07/2022 revealing total cholesterol 113, LDL 26 and HDL of 63.  Left ventricular dysfunction History of ischemic cardiomyopathy with EF by recent 2D echo performed 09/16/2022 of 45 to 50% which has remained fairly stable with an apical wall motion abnormality and a new inferolateral wall motion abnormality.  He is on carvedilol and Entresto however his LV function has remained stable.     Runell Gess MD FACP,FACC,FAHA, [facility] 09/27/2022 2:58 PM

## 2022-09-27 NOTE — Assessment & Plan Note (Signed)
History of non-STEMI September 2022 status post OCT guided AV groove circumflex PCI and drug-eluting stenting by Dr. Kirke Corin 10/18/2020 with a 3.5 mm x 15 mm long Medtronic Onyx resolute drug-eluting stent.  He had no other significant CAD.  He remains on clopidogrel.  He has felt fatigued recently and was seen in the ER with chest pressure and negative troponins.  Plan is for repeat coronary angiography.

## 2022-09-27 NOTE — Assessment & Plan Note (Signed)
History of ischemic cardiomyopathy with EF by recent 2D echo performed 09/16/2022 of 45 to 50% which has remained fairly stable with an apical wall motion abnormality and a new inferolateral wall motion abnormality.  He is on carvedilol and Entresto however his LV function has remained stable.

## 2022-09-27 NOTE — Patient Instructions (Signed)
Medication Instructions:  NO CHANGES  *If you need a refill on your cardiac medications before your next appointment, please call your pharmacy*   Lab Work: CBC and BMET today   If you have labs (blood work) drawn today and your tests are completely normal, you will receive your results only by: MyChart Message (if you have MyChart) OR A paper copy in the mail If you have any lab test that is abnormal or we need to change your treatment, we will call you to review the results.   Testing/Procedures: Left heart catheterization on 09/30/22 with Dr. Allyson Sabal    Follow-Up: At [facility], you and your health needs are our priority.  As part of our continuing mission to provide you with exceptional heart care, we have created designated Provider Care Teams.  These Care Teams include your primary Cardiologist (physician) and Advanced Practice Providers (APPs -  Physician Assistants and Nurse Practitioners) who all work together to provide you with the care you need, when you need it.  We recommend signing up for the patient portal called "MyChart".  Sign up information is provided on this After Visit Summary.  MyChart is used to connect with patients for Virtual Visits (Telemedicine).  Patients are able to view lab/test results, encounter notes, upcoming appointments, etc.  Non-urgent messages can be sent to your provider as well.   To learn more about what you can do with MyChart, go to [email or web address].    Your next appointment:    3-4 weeks with PA, NP, or Dr. Allyson Sabal  Other Instructions  [facility] A DEPT OF [place]. [place] AT [facility] [address] [place] [address] Dept: [phone] Loc: [phone]  Justin Carroll  09/27/2022  You are scheduled for a Cardiac Catheterization on Monday, August 26 with Dr. [name].  1. Please arrive at the [facility] (Main Entrance A) at [facility]: [address] [place], [place] [address] at 8:30 AM (This time is 2 hour(s) before your procedure to ensure your preparation). Free valet parking service is available. You will check in at ADMITTING. The support person will be asked to wait in the waiting room.  It is OK to have someone drop you off and come back when you are ready to be discharged.    Special note: Every effort is made to have your procedure done on time. Please understand that emergencies sometimes delay scheduled procedures.  2. Diet: Do not eat solid foods after midnight.  The patient may have clear liquids until 5am upon the day of the procedure.  3. Labs: CBC and BMET today  4. Medication instructions in preparation for your procedure:  DO NOT TAKE Farxiga the morning of the procedure  On the morning of your procedure, take your Plavix/Clopidogrel and any morning medicines NOT listed above.  You may use sips of water.  5. Plan to go home the same day, you will only stay overnight if medically necessary. 6. Bring a current list of your medications and current insurance cards. 7. You MUST have a responsible person to drive you home. 8. Someone MUST be with you the first 24 hours after you arrive home or your discharge will be delayed. 9. Please wear clothes that are easy to get on and off and wear slip-on shoes.  Thank you for allowing [place] to care for you!   -- [facility]

## 2022-09-27 NOTE — H&P (View-Only) (Signed)
09/27/2022 Justin Carroll   21-Dec-1955  [id number]  Primary Physician Billie Lade, MD Primary Cardiologist: Runell Gess MD Nicholes Calamity, [place]  HPI:  Justin Carroll is a 67 y.o. mildly overweight married Caucasian male father of 7 children (2 of whom were adopted) grandfather 2 grandchildren, who I last saw in the hospital back in September 2022.  Since that time he has been seen by Dr. Shirlee Latch in the advanced heart failure clinic for medication optimization.  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  He is currently in the process of trying to pare this down.  I last saw him in the office 04/02/2022.  He is accompanied by his wife [name] today.  His cardiac risk factor include 30 pack years tobacco abuse having quit 9 years ago, treated hypertension and hyperlipidemia.  His father did die of a myocardial infarction at age 47.  He had a non-STEMI September 2022 and had PCI drug-eluting stenting of the AV groove circumflex by Dr. Kirke Corin.  His medications for ischemic cardiomyopathy have been optimized by Dr. Shirlee Latch.  His most recent EF by cardiac MRI was 51%.  Since I saw him in the office 6 months ago he was seen in the ER on 09/10/2022 with chest pain.  Troponins were negative.  He says he felt fatigued recently and the pain will not like his non-STEMI pain still was worrisome with left chest heaviness and some left upper extremity radiation improved with sublingual nitroglycerin.   Current Meds  Medication Sig   atomoxetine (STRATTERA) 40 MG capsule Take 1 capsule (40 mg total) by mouth daily.   atorvastatin (LIPITOR) 80 MG tablet TAKE ONE (1) TABLET BY MOUTH EVERY DAY   Azelastine HCl 137 MCG/SPRAY SOLN USE 2 SPRAY(S) IN EACH NOSTRIL TWICE DAILY AS NEEDED FOR RHINITIS   buPROPion (WELLBUTRIN XL) 300 MG 24 hr tablet TAKE ONE TABLET (300MG  TOTAL) BY MOUTH DAILY   carvedilol (COREG) 12.5 MG tablet TAKE ONE TABLET BY MOUTH TWICE A DAY WITH A MEAL    Cholecalciferol (VITAMIN D-3 PO) Take 2,000 Units by mouth daily.   clopidogrel (PLAVIX) 75 MG tablet Take 1 tablet (75 mg total) by mouth daily.   dapagliflozin propanediol (FARXIGA) 10 MG TABS tablet Take 1 tablet (10 mg total) by mouth daily.   Evolocumab (REPATHA SURECLICK) 140 MG/ML SOAJ Inject 140 mg into the skin every 14 (fourteen) days.   levocetirizine (XYZAL) 5 MG tablet Take 5 mg by mouth every evening.   LORazepam (ATIVAN) 0.5 MG tablet Take 1 tablet (0.5 mg total) by mouth daily as needed for anxiety.   mometasone (NASONEX) 50 MCG/ACT nasal spray Place 1 spray into the nose daily.   pantoprazole (PROTONIX) 40 MG tablet TAKE ONE TABLET (40MG  TOTAL) BY MOUTH DAILY   sacubitril-valsartan (ENTRESTO) 24-26 MG Take 1 tablet by mouth 2 (two) times daily.   spironolactone (ALDACTONE) 25 MG tablet TAKE ONE TABLET (25 MG TOTAL) BY MOUTH AT BEDTIME.   tamsulosin (FLOMAX) 0.4 MG CAPS capsule TAKE ONE (1) TABLET BY MOUTH EVERY DAY AFTER SUPPER   testosterone cypionate (DEPOTESTOSTERONE CYPIONATE) 200 MG/ML injection Inject 0.5 mLs (100 mg total) into the muscle once a week.   traZODone (DESYREL) 100 MG tablet Take 1 tablet (100 mg total) by mouth at bedtime.   triamcinolone cream (KENALOG) 0.1 % Apply 1 Application topically as needed.   [DISCONTINUED] nitroGLYCERIN (NITROSTAT) 0.4 MG SL tablet DISSOLVE ONE  TABLET UNDER THE TONGUE EVERY 5 MINUTES AS NEEDED FOR CHEST PAIN.  DO NOT EXCEED A TOTAL OF 3 DOSES IN 15 MINUTES     No Known Allergies  Social History   Socioeconomic History   Marital status: Married    Spouse name: Leisure centre manager   Number of children: 7   Years of education: Not on file   Highest education level: Professional school degree (e.g., MD, DDS, DVM, JD)  Occupational History   Occupation: Lawyer    Comment: self employeed  Tobacco Use   Smoking status: Former    Current packs/day: 0.00    Average packs/day: 1.5 packs/day for 20.0 years (30.0 ttl pk-yrs)    Types:  Cigarettes    Start date: 68    Quit date: 02/04/2013    Years since quitting: 9.6   Smokeless tobacco: Never  Vaping Use   Vaping status: Never Used  Substance and Sexual Activity   Alcohol use: Not Currently   Drug use: No   Sexual activity: Yes    Birth control/protection: Surgical  Other Topics Concern   Not on file  Social History Narrative   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   Social Determinants of Health   Financial Resource Strain: Low Risk  (06/06/2022)   Overall Financial Resource Strain (CARDIA)    Difficulty of Paying Living Expenses: Not hard at all  Food Insecurity: No Food Insecurity (06/06/2022)   Hunger Vital Sign    Worried About Running Out of Food in the Last Year: Never true    Ran Out of Food in the Last Year: Never true  Transportation Needs: No Transportation Needs (06/06/2022)   PRAPARE - Administrator, Civil Service (Medical): No    Lack of Transportation (Non-Medical): No  Physical Activity: Insufficiently Active (06/06/2022)   Exercise Vital Sign    Days of Exercise per Week: 3 days    Minutes of Exercise per Session: 20 min  Stress: No Stress Concern Present (06/06/2022)   [employer] of Occupational Health - Occupational Stress Questionnaire    Feeling of Stress : Only a little  Social Connections: Socially Integrated (06/06/2022)   Social Connection and Isolation Panel [NHANES]    Frequency of Communication with Friends and Family: More than three times a week    Frequency of Social Gatherings with Friends and Family: More than three times a week    Attends Religious Services: More than 4 times per year    Active Member of [employer] or Organizations: Yes    Attends Engineer, structural: More than 4 times per year    Marital Status: Married  Catering manager Violence: Not on file     Review of Systems: General: negative for chills, fever, night sweats or weight changes.  Cardiovascular: negative for chest pain, dyspnea on  exertion, edema, orthopnea, palpitations, paroxysmal nocturnal dyspnea or shortness of breath Dermatological: negative for rash Respiratory: negative for cough or wheezing Urologic: negative for hematuria Abdominal: negative for nausea, vomiting, diarrhea, bright red blood per rectum, melena, or hematemesis Neurologic: negative for visual changes, syncope, or dizziness All other systems reviewed and are otherwise negative except as noted above.    Blood pressure 112/72, pulse 67, height 5\' 7"  (1.702 m), weight 171 lb 6.4 oz (77.7 kg), SpO2 96%.  General appearance: alert and no distress Neck: no adenopathy, no carotid bruit, no JVD, supple, symmetrical, trachea midline, and thyroid not enlarged, symmetric, no tenderness/mass/nodules Lungs: clear to auscultation bilaterally Heart: Regular rate  and rhythm without murmurs gallops rubs or clicks Extremities: extremities normal, atraumatic, no cyanosis or edema Pulses: 2+ and symmetric Skin: Skin color, texture, turgor normal. No rashes or lesions Neurologic: Grossly normal  EKG EKG Interpretation Date/Time:  Friday September 27 2022 14:07:12 EDT Ventricular Rate:  61 PR Interval:  160 QRS Duration:  100 QT Interval:  414 QTC Calculation: 416 R Axis:   46  Text Interpretation: Sinus rhythm with occasional Premature ventricular complexes Incomplete right bundle branch block When compared with ECG of 10-Sep-2022 13:56, PREVIOUS ECG IS PRESENT Confirmed by [name] [id number]) on 09/27/2022 2:24:29 PM    ASSESSMENT AND PLAN:   NSTEMI (non-ST elevated myocardial infarction) [facility]) History of non-STEMI September 2022 status post OCT guided AV groove circumflex PCI and drug-eluting stenting by Dr. Kirke Corin 10/18/2020 with a 3.5 mm x 15 mm long Medtronic Onyx resolute drug-eluting stent.  He had no other significant CAD.  He remains on clopidogrel.  He has felt fatigued recently and was seen in the ER with chest pressure and negative troponins.   Plan is for repeat coronary angiography.  Hyperlipemia History of hyperlipidemia on high-dose statin therapy with lipid profile performed 05/07/2022 revealing total cholesterol 113, LDL 26 and HDL of 63.  Left ventricular dysfunction History of ischemic cardiomyopathy with EF by recent 2D echo performed 09/16/2022 of 45 to 50% which has remained fairly stable with an apical wall motion abnormality and a new inferolateral wall motion abnormality.  He is on carvedilol and Entresto however his LV function has remained stable.     Runell Gess MD FACP,FACC,FAHA, [facility] 09/27/2022 2:58 PM

## 2022-09-27 NOTE — Assessment & Plan Note (Signed)
History of hyperlipidemia on high-dose statin therapy with lipid profile performed 05/07/2022 revealing total cholesterol 113, LDL 26 and HDL of 63.

## 2022-09-28 LAB — BASIC METABOLIC PANEL
BUN/Creatinine Ratio: 19 (ref 10–24)
BUN: 21 mg/dL (ref 8–27)
CO2: 22 mmol/L (ref 20–29)
Calcium: 9.4 mg/dL (ref 8.6–10.2)
Chloride: 101 mmol/L (ref 96–106)
Creatinine, Ser: 1.08 mg/dL (ref 0.76–1.27)
Glucose: 72 mg/dL (ref 70–99)
Potassium: 4.8 mmol/L (ref 3.5–5.2)
Sodium: 138 mmol/L (ref 134–144)
eGFR: 75 mL/min/{1.73_m2} (ref >59)

## 2022-09-28 LAB — CBC
Hematocrit: 45.9 % (ref 37.5–51.0)
Hemoglobin: 15.5 g/dL (ref 13.0–17.7)
MCH: 32.4 pg (ref 26.6–33.0)
MCHC: 33.8 g/dL (ref 31.5–35.7)
MCV: 96 fL (ref 79–97)
Platelets: 158 10*3/uL (ref 150–450)
RBC: 4.78 x10E6/uL (ref 4.14–5.80)
RDW: 11.5 % — ABNORMAL LOW (ref 11.6–15.4)
WBC: 6.8 10*3/uL (ref 3.4–10.8)

## 2022-09-30 ENCOUNTER — Ambulatory Visit (HOSPITAL_COMMUNITY)
Admission: RE | Admit: 2022-09-30 | Discharge: 2022-09-30 | Disposition: A | Discharge status: Home / Self Care | Payer: Medicare Other | Source: Home / Self Care | Attending: Cardiovascular Disease | Admitting: Cardiovascular Disease

## 2022-09-30 ENCOUNTER — Encounter (HOSPITAL_COMMUNITY)
Admission: RE | Disposition: A | Discharge status: Home / Self Care | Payer: Self-pay | Source: Home / Self Care | Attending: Cardiovascular Disease

## 2022-09-30 DIAGNOSIS — I255 Ischemic cardiomyopathy: Secondary | ICD-10-CM | POA: Diagnosis not present

## 2022-09-30 DIAGNOSIS — Z955 Presence of coronary angioplasty implant and graft: Secondary | ICD-10-CM | POA: Diagnosis not present

## 2022-09-30 DIAGNOSIS — Z79899 Other long term (current) drug therapy: Secondary | ICD-10-CM | POA: Diagnosis not present

## 2022-09-30 DIAGNOSIS — Z7902 Long term (current) use of antithrombotics/antiplatelets: Secondary | ICD-10-CM | POA: Insufficient documentation

## 2022-09-30 DIAGNOSIS — I214 Non-ST elevation (NSTEMI) myocardial infarction: Secondary | ICD-10-CM | POA: Diagnosis not present

## 2022-09-30 DIAGNOSIS — E785 Hyperlipidemia, unspecified: Secondary | ICD-10-CM | POA: Diagnosis not present

## 2022-09-30 DIAGNOSIS — Z87891 Personal history of nicotine dependence: Secondary | ICD-10-CM | POA: Insufficient documentation

## 2022-09-30 DIAGNOSIS — I519 Heart disease, unspecified: Secondary | ICD-10-CM | POA: Insufficient documentation

## 2022-09-30 DIAGNOSIS — R079 Chest pain, unspecified: Secondary | ICD-10-CM | POA: Diagnosis not present

## 2022-09-30 DIAGNOSIS — Z8249 Family history of ischemic heart disease and other diseases of the circulatory system: Secondary | ICD-10-CM | POA: Insufficient documentation

## 2022-09-30 DIAGNOSIS — I2510 Atherosclerotic heart disease of native coronary artery without angina pectoris: Secondary | ICD-10-CM | POA: Diagnosis not present

## 2022-09-30 DIAGNOSIS — I252 Old myocardial infarction: Secondary | ICD-10-CM | POA: Diagnosis not present

## 2022-09-30 HISTORY — PX: LEFT HEART CATH AND CORONARY ANGIOGRAPHY: CATH118249

## 2022-09-30 SURGERY — LEFT HEART CATH AND CORONARY ANGIOGRAPHY
Anesthesia: LOCAL

## 2022-09-30 MED ORDER — NITROGLYCERIN 1 MG/10 ML FOR IR/CATH LAB
INTRA_ARTERIAL | Status: AC
  Filled 2022-09-30: qty 10

## 2022-09-30 MED ORDER — CLOPIDOGREL BISULFATE 75 MG PO TABS: 75.0000 mg | ORAL_TABLET | Freq: Every day | ORAL | Status: DC

## 2022-09-30 MED ORDER — HYDRALAZINE HCL 20 MG/ML IJ SOLN: 10.0000 mg | INTRAMUSCULAR | Status: DC | PRN

## 2022-09-30 MED ORDER — VERAPAMIL HCL 2.5 MG/ML IV SOLN
INTRAVENOUS | Status: AC
  Filled 2022-09-30: qty 2

## 2022-09-30 MED ORDER — HEPARIN SODIUM (PORCINE) 1000 UNIT/ML IJ SOLN
INTRAMUSCULAR | Status: DC | PRN
  Administered 2022-09-30: 4000 [IU] via INTRAVENOUS

## 2022-09-30 MED ORDER — SODIUM CHLORIDE 0.9% FLUSH: 3.0000 mL | INTRAVENOUS | Status: DC | PRN

## 2022-09-30 MED ORDER — VERAPAMIL HCL 2.5 MG/ML IV SOLN
INTRA_ARTERIAL | Status: DC | PRN
  Administered 2022-09-30: 10 mL via INTRA_ARTERIAL

## 2022-09-30 MED ORDER — SODIUM CHLORIDE 0.9 % IV SOLN: 250.0000 mL | INTRAVENOUS | Status: DC | PRN

## 2022-09-30 MED ORDER — ASPIRIN 81 MG PO CHEW
81.0000 mg | CHEWABLE_TABLET | ORAL | Status: AC
  Administered 2022-09-30: 81 mg via ORAL
  Filled 2022-09-30: qty 1

## 2022-09-30 MED ORDER — SODIUM CHLORIDE 0.9% FLUSH: 3.0000 mL | Freq: Two times a day (BID) | INTRAVENOUS | Status: DC

## 2022-09-30 MED ORDER — FENTANYL CITRATE (PF) 100 MCG/2ML IJ SOLN
INTRAMUSCULAR | Status: AC
  Filled 2022-09-30: qty 2

## 2022-09-30 MED ORDER — ASPIRIN 81 MG PO CHEW: 81.0000 mg | CHEWABLE_TABLET | Freq: Every day | ORAL | Status: DC

## 2022-09-30 MED ORDER — MIDAZOLAM HCL 2 MG/2ML IJ SOLN
INTRAMUSCULAR | Status: AC
  Filled 2022-09-30: qty 2

## 2022-09-30 MED ORDER — ACETAMINOPHEN 325 MG PO TABS: 650.0000 mg | ORAL_TABLET | ORAL | Status: DC | PRN

## 2022-09-30 MED ORDER — ONDANSETRON HCL 4 MG/2ML IJ SOLN: 4.0000 mg | Freq: Four times a day (QID) | INTRAMUSCULAR | Status: DC | PRN

## 2022-09-30 MED ORDER — SODIUM CHLORIDE 0.9 % IV SOLN: INTRAVENOUS | Status: DC

## 2022-09-30 MED ORDER — SODIUM CHLORIDE 0.9 % WEIGHT BASED INFUSION
1.0000 mL/kg/h | INTRAVENOUS | Status: DC
  Administered 2022-09-30 (×2): 250 mL via INTRAVENOUS

## 2022-09-30 MED ORDER — IOHEXOL 350 MG/ML SOLN
INTRAVENOUS | Status: DC | PRN
  Administered 2022-09-30: 37 mL

## 2022-09-30 MED ORDER — HEPARIN SODIUM (PORCINE) 1000 UNIT/ML IJ SOLN
INTRAMUSCULAR | Status: AC
  Filled 2022-09-30: qty 10

## 2022-09-30 MED ORDER — SODIUM CHLORIDE 0.9 % WEIGHT BASED INFUSION
3.0000 mL/kg/h | INTRAVENOUS | Status: AC
  Administered 2022-09-30: 3 mL/kg/h via INTRAVENOUS

## 2022-09-30 MED ORDER — LIDOCAINE HCL (PF) 1 % IJ SOLN
INTRAMUSCULAR | Status: AC
  Filled 2022-09-30: qty 30

## 2022-09-30 MED ORDER — LABETALOL HCL 5 MG/ML IV SOLN: 10.0000 mg | INTRAVENOUS | Status: DC | PRN

## 2022-09-30 MED ORDER — HEPARIN (PORCINE) IN NACL 1000-0.9 UT/500ML-% IV SOLN
INTRAVENOUS | Status: DC | PRN
  Administered 2022-09-30 (×2): 500 mL

## 2022-09-30 MED ORDER — MORPHINE SULFATE (PF) 2 MG/ML IV SOLN: 2.0000 mg | INTRAVENOUS | Status: DC | PRN

## 2022-09-30 MED ORDER — LIDOCAINE HCL (PF) 1 % IJ SOLN
INTRAMUSCULAR | Status: DC | PRN
  Administered 2022-09-30: 2 mL

## 2022-09-30 SURGICAL SUPPLY — 10 items
CATH INFINITI AMBI 5FR TG (CATHETERS) IMPLANT
CATH INFINITI JR4 5F (CATHETERS) IMPLANT
DEVICE RAD COMP TR BAND LRG (VASCULAR PRODUCTS) IMPLANT
GLIDESHEATH SLEND A-KIT 6F 22G (SHEATH) IMPLANT
GUIDEWIRE INQWIRE 1.5J.035X260 (WIRE) IMPLANT
INQWIRE 1.5J .035X260CM (WIRE) ×1
PACK CARDIAC CATHETERIZATION (CUSTOM PROCEDURE TRAY) ×1 IMPLANT
SET ATX-X65L (MISCELLANEOUS) IMPLANT
SHEATH PROBE COVER 6X72 (BAG) IMPLANT
WIRE HI TORQ VERSACORE-J 145CM (WIRE) IMPLANT

## 2022-09-30 NOTE — Progress Notes (Signed)
Patient and wife was given discharge instructions. Both verbalized understanding. 

## 2022-09-30 NOTE — Interval H&P Note (Signed)
Cath Lab Visit (complete for each Cath Lab visit)  Clinical Evaluation Leading to the Procedure:   ACS: No.  Non-ACS:    Anginal Classification: CCS I  Anti-ischemic medical therapy: Minimal Therapy (1 class of medications)  Non-Invasive Test Results: No non-invasive testing performed  Prior CABG: No previous CABG      History and Physical Interval Note:  09/30/2022 8:43 AM  Justin Carroll  has presented today for surgery, with the diagnosis of chest pain.  The various methods of treatment have been discussed with the patient and family. After consideration of risks, benefits and other options for treatment, the patient has consented to  Procedure(s): LEFT HEART CATH AND CORONARY ANGIOGRAPHY (N/A) as a surgical intervention.  The patient's history has been reviewed, patient examined, no change in status, stable for surgery.  I have reviewed the patient's chart and labs.  Questions were answered to the patient's satisfaction.     Justin Carroll

## 2022-09-30 NOTE — Discharge Instructions (Signed)

## 2022-10-01 ENCOUNTER — Encounter (HOSPITAL_COMMUNITY): Payer: Self-pay | Admitting: Cardiovascular Disease

## 2022-10-10 ENCOUNTER — Encounter: Payer: Self-pay | Admitting: Cardiovascular Disease

## 2022-10-10 DIAGNOSIS — F418 Other specified anxiety disorders: Secondary | ICD-10-CM

## 2022-10-10 MED ORDER — BUPROPION HCL ER (XL) 300 MG PO TB24
300.0000 mg | ORAL_TABLET | Freq: Every day | ORAL | 0 refills | Status: DC
Start: 2022-10-10 — End: 2022-12-31

## 2022-10-15 ENCOUNTER — Encounter: Payer: Medicare Other | Admitting: Psychology

## 2022-10-16 DIAGNOSIS — E119 Type 2 diabetes mellitus without complications: Secondary | ICD-10-CM | POA: Diagnosis not present

## 2022-10-16 DIAGNOSIS — H2513 Age-related nuclear cataract, bilateral: Secondary | ICD-10-CM | POA: Diagnosis not present

## 2022-10-16 DIAGNOSIS — H35372 Puckering of macula, left eye: Secondary | ICD-10-CM | POA: Diagnosis not present

## 2022-10-16 DIAGNOSIS — H5509 Other forms of nystagmus: Secondary | ICD-10-CM | POA: Diagnosis not present

## 2022-10-16 LAB — HM DIABETES EYE EXAM

## 2022-10-21 ENCOUNTER — Other Ambulatory Visit: Payer: Medicare Other

## 2022-10-21 DIAGNOSIS — Z8639 Personal history of other endocrine, nutritional and metabolic disease: Secondary | ICD-10-CM | POA: Diagnosis not present

## 2022-10-21 NOTE — Progress Notes (Unsigned)
Cardiology Clinic Note   Patient Name: Justin Carroll Date of Encounter: 10/22/2022  Primary Care Provider:  Billie Lade, MD Primary Cardiologist:  [name], MD  Patient Profile    [name] presents to clinic today for follow-up evaluation of his coronary artery disease and chronic systolic CHF.  Past Medical History    Past Medical History:  Diagnosis Date   Allergy Lifetime   Mostly pollen   Anxiety Not sure   Ativan prn. A few times a month   CKD (chronic kidney disease) stage 2, GFR 60-89 ml/min 05/07/2022   Depression    Encounter for general adult medical examination with abnormal findings 05/07/2022   GERD (gastroesophageal reflux disease) 5 years   Myocardial infarction (HCC) 10/17/2020   Stent   Sinus infection    yearly   Sleep apnea Diagnosed 2022   CPAP   Substance abuse (HCC) 40+ years ago   Sober since 11/19/1985   Past Surgical History:  Procedure Laterality Date   ADENOIDECTOMY     CORONARY IMAGING/OCT N/A 10/18/2020   Procedure: INTRAVASCULAR IMAGING/OCT;  Surgeon: Iran Ouch, MD;  Location: [facility];  Service: Cardiovascular;  Laterality: N/A;   CORONARY STENT INTERVENTION N/A 10/18/2020   Procedure: CORONARY STENT INTERVENTION;  Surgeon: Iran Ouch, MD;  Location: [facility];  Service: Cardiovascular;  Laterality: N/A;   LEFT HEART CATH AND CORONARY ANGIOGRAPHY N/A 10/18/2020   Procedure: LEFT HEART CATH AND CORONARY ANGIOGRAPHY;  Surgeon: Iran Ouch, MD;  Location: [facility];  Service: Cardiovascular;  Laterality: N/A;   LEFT HEART CATH AND CORONARY ANGIOGRAPHY N/A 09/30/2022   Procedure: LEFT HEART CATH AND CORONARY ANGIOGRAPHY;  Surgeon: Runell Gess, MD;  Location: [facility];  Service: Cardiovascular;  Laterality: N/A;   NASAL SEPTUM SURGERY     SINOSCOPY     TONSILLECTOMY     age 67    Allergies  No Known Allergies  History of Present Illness    [name] has a  PMH of ADHD, depression, mild cognitive impairment, coronary artery disease status post NSTEMI, and chronic systolic CHF with an EF of 35-40%, RV normal on 10/17/2020 echo.  He underwent cardiac catheterization 10/18/2020 which showed a proximal RCA lesion 20%, proximal circumflex-mid circumflex lesion 85%, DES x1 with 0% residual stenosis, normal LVEF.  He was seen by the heart and vascular transition of care clinic on 10/24/2020.  He was started on Entresto 24-26.  With plan for repeat echocardiogram in 3 months.  His Farxiga and carvedilol were continued.  Plan for follow-up in 2 weeks with pharmacy in 6 weeks with Dr. Shirlee Latch.  He presented to the clinic 10/30/20 for follow-up evaluation stated he noticed some fatigue.  We reviewed his echocardiogram and angiography.  He was started on Entresto on 10/24/2020.  His wife presented with him and he noted some increased throat clearing.  He reported that he had allergies for several years and contniued to take allergy medication medication OTC.  He had contacted [facility] and was told that he may not be able to start rehab until October.  He had been walking at home 2 times per day and tolerating well.    He continued to follow with Dr. Shirlee Latch and Dr. Allyson Sabal.  His GDMT had been optimized.  His EF by cardiac MRI was noted to be 51%.  He was seen by Dr. Allyson Sabal 09/27/2022.  He reported that he  had been in the emergency department with chest discomfort on 09/30/2022.  His cardiac troponins at that time were negative.  He noted fatigue and chest discomfort.  This was not like his NSTEMI discomfort.  However, he described left-sided chest heaviness and left upper extremity radiation that improved with sublingual nitroglycerin.  He underwent cardiac catheterization on 09/30/2022.  He was noted to have proximal RCA stenosis 20%.  His proximal circumflex-mid circumflex stent was widely patent.  He presents to the clinic today for follow-up evaluation and  states he has not had any further episodes of chest discomfort.  He continues to note fatigue.  His blood pressure today is 90/60.  Pulse is 64.  He reports compliance with his other heart failure medications.  His weight today is 175 pounds.  We reviewed his recent left heart cath.  He expressed understanding.  We reviewed his Repatha medication.  He continues to try to follow a low-sodium diet.  He has not been as physically active.  He enjoys spending time with his grandchildren.  I will reduce his carvedilol to 6.25 mg twice daily and plan follow-up in 3 to 4 months.  Today he denies chest pain, shortness of breath, lower extremity edema, fatigue, palpitations, melena, hematuria, hemoptysis, diaphoresis, weakness, presyncope, syncope, orthopnea, and PND.    Home Medications    Prior to Admission medications   Medication Sig Start Date End Date Taking? Authorizing Provider  aspirin 81 MG EC tablet Take 1 tablet (81 mg total) by mouth daily. Swallow whole. 10/20/20   Little Ishikawa, MD  atorvastatin (LIPITOR) 80 MG tablet Take 1 tablet (80 mg total) by mouth daily. 10/20/20   Little Ishikawa, MD  Azelastine HCl 137 MCG/SPRAY SOLN USE 2 SPRAY(S) IN EACH NOSTRIL TWICE DAILY AS NEEDED FOR RHINITIS Patient taking differently: Place 2 sprays into both nostrils 2 (two) times daily as needed (nasal congestion). 09/21/20   Alfonse Spruce, MD  buPROPion (WELLBUTRIN XL) 300 MG 24 hr tablet Take 1 tablet by mouth once daily 08/21/20   Wilson Singer, MD  carvedilol (COREG) 3.125 MG tablet Take 1 tablet (3.125 mg total) by mouth 2 (two) times daily with a meal. 10/20/20   Little Ishikawa, MD  Cholecalciferol (VITAMIN D-3) 25 MCG (1000 UT) CAPS Take 2 capsules by mouth daily.    [provider]  dapagliflozin propanediol (FARXIGA) 10 MG TABS tablet Take 1 tablet (10 mg total) by mouth daily. 10/20/20   Little Ishikawa, MD  levocetirizine (XYZAL) 5 MG tablet Take 5  mg by mouth every evening.    [provider]  LORazepam (ATIVAN) 0.5 MG tablet Take 1 tablet (0.5 mg total) by mouth daily as needed for anxiety. 10/24/20   Clegg, Amy D, NP  mometasone (NASONEX) 50 MCG/ACT nasal spray Place 1 spray into the nose daily. 12/08/19   Alfonse Spruce, MD  nitroGLYCERIN (NITROSTAT) 0.4 MG SL tablet Place 1 tablet (0.4 mg total) under the tongue every 5 (five) minutes x 3 doses as needed for chest pain. 10/20/20   Little Ishikawa, MD  pantoprazole (PROTONIX) 40 MG tablet Take 1 tablet by mouth once daily 08/21/20   Gosrani, Nimish C, MD  sacubitril-valsartan (ENTRESTO) 24-26 MG Take 1 tablet by mouth 2 (two) times daily. 10/24/20   Clegg, Amy D, NP  sertraline (ZOLOFT) 25 MG tablet Take 0.5 tablets (12.5 mg total) by mouth at bedtime. 10/24/20   Clegg, Amy D, NP  tamsulosin (FLOMAX) 0.4 MG  CAPS capsule TAKE 1 CAPSULE BY MOUTH ONCE DAILY AFTER SUPPER Patient taking differently: Take 0.4 mg by mouth daily after supper. 08/21/20   Wilson Singer, MD  testosterone cypionate (DEPOTESTOSTERONE CYPIONATE) 200 MG/ML injection INJECT 0.5 MLS INTO THE MUSCLE TWICE A WEEK. 02/28/20   Lilly Cove C, MD  ticagrelor (BRILINTA) 90 MG TABS tablet Take 1 tablet (90 mg total) by mouth 2 (two) times daily. 10/20/20   Little Ishikawa, MD  traZODone (DESYREL) 100 MG tablet TAKE 1 TABLET BY MOUTH AT BEDTIME 08/21/20   Wilson Singer, MD    Family History    Family History  Problem Relation Age of Onset   Stroke Mother    Alcohol abuse Father    Heart disease Father    Allergic rhinitis Brother    Alcohol abuse Brother    Anxiety disorder Brother    Depression Brother    Depression Brother    Drug abuse Brother    Stroke Brother    Heart disease Paternal Uncle    Heart disease Paternal Uncle    Colon cancer Neg Hx    Pancreatic cancer Neg Hx    Rectal cancer Neg Hx    Stomach cancer Neg Hx    He indicated that his mother is deceased. He  indicated that his father is deceased. He indicated that one of his two brothers is deceased. He indicated that the status of his neg hx is unknown.   Social History    Social History   Socioeconomic History   Marital status: Married    Spouse name: Leisure centre manager   Number of children: 7   Years of education: Not on file   Highest education level: Professional school degree (e.g., MD, DDS, DVM, JD)  Occupational History   Occupation: Lawyer    Comment: self employeed  Tobacco Use   Smoking status: Former    Current packs/day: 0.00    Average packs/day: 1.5 packs/day for 20.0 years (30.0 ttl pk-yrs)    Types: Cigarettes    Start date: 16    Quit date: 02/04/2013    Years since quitting: 9.7   Smokeless tobacco: Never  Vaping Use   Vaping status: Never Used  Substance and Sexual Activity   Alcohol use: Not Currently   Drug use: No   Sexual activity: Yes    Birth control/protection: Surgical  Other Topics Concern   Not on file  Social History Narrative   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   Social Determinants of Health   Financial Resource Strain: Low Risk  (06/06/2022)   Overall Financial Resource Strain (CARDIA)    Difficulty of Paying Living Expenses: Not hard at all  Food Insecurity: No Food Insecurity (06/06/2022)   Hunger Vital Sign    Worried About Running Out of Food in the Last Year: Never true    Ran Out of Food in the Last Year: Never true  Transportation Needs: No Transportation Needs (06/06/2022)   PRAPARE - Administrator, Civil Service (Medical): No    Lack of Transportation (Non-Medical): No  Physical Activity: Insufficiently Active (06/06/2022)   Exercise Vital Sign    Days of Exercise per Week: 3 days    Minutes of Exercise per Session: 20 min  Stress: No Stress Concern Present (06/06/2022)   [employer] of Occupational Health - Occupational Stress Questionnaire    Feeling of Stress : Only a little  Social Connections: Socially Integrated  (06/06/2022)   Social Connection and  Isolation Panel [NHANES]    Frequency of Communication with Friends and Family: More than three times a week    Frequency of Social Gatherings with Friends and Family: More than three times a week    Attends Religious Services: More than 4 times per year    Active Member of [employer] or Organizations: Yes    Attends Engineer, structural: More than 4 times per year    Marital Status: Married  Catering manager Violence: Not on file     Review of Systems    General:  No chills, fever, night sweats or weight changes.  Cardiovascular:  No chest pain, dyspnea on exertion, edema, orthopnea, palpitations, paroxysmal nocturnal dyspnea. Dermatological: No rash, lesions/masses Respiratory: No cough, dyspnea Urologic: No hematuria, dysuria Abdominal:   No nausea, vomiting, diarrhea, bright red blood per rectum, melena, or hematemesis Neurologic:  No visual changes, wkns, changes in mental status. All other systems reviewed and are otherwise negative except as noted above.  Physical Exam    VS:  BP 90/60 (BP Location: Left Arm, Patient Position: Sitting, Cuff Size: Normal)   Pulse 64   Ht 5\' 6"  (1.676 m)   Wt 175 lb (79.4 kg)   SpO2 98%   BMI 28.25 kg/m  , BMI Body mass index is 28.25 kg/m. GEN: Well nourished, well developed, in no acute distress. HEENT: normal. Neck: Supple, no JVD, carotid bruits, or masses. Cardiac: RRR, no murmurs, rubs, or gallops. No clubbing, cyanosis, edema.  Radials/DP/PT 2+ and equal bilaterally.  Respiratory:  Respirations regular and unlabored, clear to auscultation bilaterally. GI: Soft, nontender, nondistended, BS + x 4. MS: no deformity or atrophy. Skin: warm and dry, no rash.  Right radial cath site clean dry intact no drainage. Neuro:  Strength and sensation are intact. Psych: Normal affect.  Accessory Clinical Findings    Recent Labs: 05/07/2022: TSH 1.090 07/31/2022: ALT 14 09/27/2022: BUN 21; Creatinine, Ser  1.08; Hemoglobin 15.5; Platelets 158; Potassium 4.8; Sodium 138   Recent Lipid Panel    Component Value Date/Time   CHOL 113 05/07/2022 0934   TRIG 142 05/07/2022 0934   HDL 63 05/07/2022 0934   CHOLHDL 1.8 05/07/2022 0934   CHOLHDL 2.0 02/18/2022 1446   VLDL 16 02/18/2022 1446   LDLCALC 26 05/07/2022 0934    ECG personally reviewed by me today-none today.  EKG 9/23 normal sinus rhythm 68 bpm no ST or T wave deviation- No acute changes  Echocardiogram 10/18/2020 IMPRESSIONS     1. Left ventricular ejection fraction, by estimation, is 35 to 40%. The  left ventricle has moderately decreased function. The left ventricle  demonstrates global hypokinesis. There is mild concentric left ventricular  hypertrophy. Left ventricular  diastolic parameters are consistent with Grade I diastolic dysfunction  (impaired relaxation).   2. Right ventricular systolic function is normal. The right ventricular  size is normal.   3. Right atrial size was mildly dilated.   4. The mitral valve is normal in structure. Mild mitral valve  regurgitation. No evidence of mitral stenosis.   5. The aortic valve is tricuspid. Aortic valve regurgitation is not  visualized. No aortic stenosis is present.   6. Aortic dilatation noted. There is mild dilatation of the aortic root,  measuring 40 mm. There is borderline dilatation of the ascending aorta,  measuring 36 mm.   7. The inferior vena cava is normal in size with greater than 50%  respiratory variability, suggesting right atrial pressure of 3 mmHg.  Cardiac  catheterization 02/18/2020   Prox RCA lesion is 20% stenosed.   Prox Cx to Mid Cx lesion is 85% stenosed.   A drug-eluting stent was successfully placed using a STENT ONYX FRONTIER 3.5X15.   Post intervention, there is a 0% residual stenosis.   The left ventricular systolic function is normal.   LV end diastolic pressure is normal.   The left ventricular ejection fraction is 55-65% by visual  estimate.   1.  Severe one-vessel coronary artery disease involving the mid left circumflex which is likely culprit for non-STEMI. 2. Successful OCT guided DES placement to mid LCX.    Recommendations:  Dual antiplatelet therapy for at least 12 months. Aggressive treatment of risk factors.  Diagnostic Dominance: Right Intervention    Cardiac catheterization 09/30/2022    Prox RCA lesion is 20% stenosed.   Non-stenotic Prox Cx to Mid Cx lesion was previously treated.   Justin Carroll is a 67 y.o. male      [id number] LOCATION:  FACILITY: [facility]  PHYSICIAN: [name], M.D. February 05, 1955     DATE OF PROCEDURE:  09/30/2022   DATE OF DISCHARGE:        CARDIAC CATHETERIZATION        History obtained from chart review. Justin Carroll is a 67 y.o. mildly overweight married Caucasian male father of 7 children (2 of whom were adopted) grandfather 2 grandchildren, who I last saw in the hospital back in September 2022.  Since that time he has been seen by Dr. Shirlee Latch in the advanced heart failure clinic for medication optimization.  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  He is currently in the process of trying to pare this down.  I last saw him in the office 04/02/2022.  He is accompanied by his wife [name] today.  His cardiac risk factor include 30 pack years tobacco abuse having quit 9 years ago, treated hypertension and hyperlipidemia.  His father did die of a myocardial infarction at age 692.  He had a non-STEMI September 2022 and had PCI drug-eluting stenting of the AV groove circumflex by Dr. Kirke Corin.  His medications for ischemic cardiomyopathy have been optimized by Dr. Shirlee Latch.  His most recent EF by cardiac MRI was 51%.   Since I saw him in the office 6 months ago he was seen in the ER on 09/10/2022 with chest pain.  Troponins were negative.  He says he felt fatigued recently and the pain will not like his non-STEMI pain still was worrisome with left chest  heaviness and some left upper extremity radiation improved with sublingual nitroglycerin.  He presents now for outpatient radial diagnostic cath to define his anatomy.     IMPRESSION: Mr. Vigen mid AV groove circumflex stent is widely patent.  He otherwise has no other obstructive disease and his cath is for all intents and purposes similar to his cath after Dr. Kirke Corin placed the stent 2 years ago.  His LVEDP was low at 4 mmHg.  I believe some of his symptoms may be related to relative hypotension.  His medications may need to be adjusted as an outpatient.  His radial sheath was removed and a TR band was placed on the right wrist to achieve patent hemostasis.  The patient left the lab in stable condition.  He will be gently hydrated, and discharged home later this morning.  Will arrange outpatient follow-up with an APP in 3 to 4 weeks and with me in 6  to 8 weeks.   [name]. MD, [facility] 09/30/2022 9:27 AM  Diagnostic Dominance: Right  Intervention    Assessment & Plan   1.  Coronary artery disease, NSTEMI-no chest pain today.  Reported chest heaviness that radiated to his left arm and was relieved by sublingual nitroglycerin.  Underwent subsequent LHC on 09/30/2022.  Circumflex stent widely patent.  Minimal less than 20% RCA stenosis noted.  Details above.  Underwent cardiac catheterization on 10/18/2020.  Was noted to have 20% proximal RCA stenosis, proximal-mid circumflex stenosis 85% and received DES x1. Continue Plavix atorvastatin, carvedilol Heart healthy low-sodium diet-salty 6 reviewed Increase physical activity as tolerated  HFrEF-cardiac MRI showed an EF of 51%.  Euvolemic.   Continue current medical therapy Heart healthy low-sodium diet Increase physical activity as tolerated  Hyperlipidemia-02/18/2022: VLDL 16 05/07/2022: Cholesterol, Total 113; HDL 63; LDL Chol Calc (NIH) 26; Triglycerides 142 Continue Plavix, atorvastatin Heart healthy low-sodium diet Maintain physical  activity Repeat fasting lipids and LFTs 1/25  Essential hypertension-BP today 90/60.  Well-controlled at home.  Reports ongoing fatigue. Decrease carvedilol to 6.25 twice daily.   Heart healthy low-sodium diet Increase physical activity as tolerated Maintain blood pressure log    Disposition: Follow-up with Dr. Allyson Sabal in 3-4 months  Justin Carroll. Justin Cocozza NP-C    10/22/2022, 8:44 AM [facility] [address] Office [phone] Fax [phone]  Notice: This dictation was prepared with Dragon dictation along with smaller phrase technology. Any transcriptional errors that result from this process are unintentional and may not be corrected upon review.  I spent 14 minutes examining this patient, reviewing medications, and using patient centered shared decision making involving her cardiac care.  Prior to her visit I spent greater than 20 minutes reviewing her past medical history,  medications, and prior cardiac tests.

## 2022-10-22 ENCOUNTER — Ambulatory Visit: Payer: Medicare Other | Attending: General Practice | Admitting: General Practice

## 2022-10-22 ENCOUNTER — Encounter: Payer: Self-pay | Admitting: General Practice

## 2022-10-22 VITALS — BP 90/60 | HR 64 | Ht 66.0 in | Wt 175.0 lb

## 2022-10-22 DIAGNOSIS — I10 Essential (primary) hypertension: Secondary | ICD-10-CM

## 2022-10-22 DIAGNOSIS — I5020 Unspecified systolic (congestive) heart failure: Secondary | ICD-10-CM

## 2022-10-22 DIAGNOSIS — E785 Hyperlipidemia, unspecified: Secondary | ICD-10-CM | POA: Diagnosis not present

## 2022-10-22 DIAGNOSIS — I2510 Atherosclerotic heart disease of native coronary artery without angina pectoris: Secondary | ICD-10-CM

## 2022-10-22 MED ORDER — CARVEDILOL 6.25 MG PO TABS: 6.2500 mg | ORAL_TABLET | Freq: Two times a day (BID) | ORAL | 1 refills | Status: DC

## 2022-10-22 NOTE — Patient Instructions (Signed)
Medication Instructions:  DECREASE CARVEDILOL 6.25MG  TWICE DAILY *If you need a refill on your cardiac medications before your next appointment, please call your pharmacy*  Lab Work: NONE If you have labs (blood work) drawn today and your tests are completely normal, you will receive your results only by:  MyChart Message (if you have MyChart) OR  A paper copy in the mail If you have any lab test that is abnormal or we need to change your treatment, we will call you to review the results.  Other Instructions INCREASE PHYSICAL ACTIVITY AS TOLERATED PLEASE READ AND FOLLOW LOW SALT DIET ATTACHED  Follow-Up: At [facility], you and your health needs are our priority.  As part of our continuing mission to provide you with exceptional heart care, we have created designated Provider Care Teams.  These Care Teams include your primary Cardiologist (physician) and Advanced Practice Providers (APPs -  Physician Assistants and Nurse Practitioners) who all work together to provide you with the care you need, when you need it.  Your next appointment:   3-4 month(s)  Provider:   [name], MD  or Edd Fabian, FNP        DASH Eating Plan (LOW SALT DIET) DASH stands for Dietary Approaches to Stop Hypertension. The DASH eating plan is a healthy eating plan that has been shown to: Lower high blood pressure (hypertension). Reduce your risk for type 2 diabetes, heart disease, and stroke. Help with weight loss. What are tips for following this plan? Reading food labels Check food labels for the amount of salt (sodium) per serving. Choose foods with less than 5 percent of the Daily Value (DV) of sodium. In general, foods with less than 300 milligrams (mg) of sodium per serving fit into this eating plan. To find whole grains, look for the word "whole" as the first word in the ingredient list. Shopping Buy products labeled as "low-sodium" or "no salt added." Buy fresh foods. Avoid canned  foods and pre-made or frozen meals. Cooking Try not to add salt when you cook. Use salt-free seasonings or herbs instead of table salt or sea salt. Check with your health care provider or pharmacist before using salt substitutes. Do not fry foods. Cook foods in healthy ways, such as baking, boiling, grilling, roasting, or broiling. Cook using oils that are good for your heart. These include olive, canola, avocado, soybean, and sunflower oil. Meal planning  Eat a balanced diet. This should include: 4 or more servings of fruits and 4 or more servings of vegetables each day. Try to fill half of your plate with fruits and vegetables. 6-8 servings of whole grains each day. 6 or less servings of lean meat, poultry, or fish each day. 1 oz is 1 serving. A 3 oz (85 g) serving of meat is about the same size as the palm of your hand. One egg is 1 oz (28 g). 2-3 servings of low-fat dairy each day. One serving is 1 cup (237 mL). 1 serving of nuts, seeds, or beans 5 times each week. 2-3 servings of heart-healthy fats. Healthy fats called omega-3 fatty acids are found in foods such as walnuts, flaxseeds, fortified milks, and eggs. These fats are also found in cold-water fish, such as sardines, salmon, and mackerel. Limit how much you eat of: Canned or prepackaged foods. Food that is high in trans fat, such as fried foods. Food that is high in saturated fat, such as fatty meat. Desserts and other sweets, sugary drinks, and other  foods with added sugar. Full-fat dairy products. Do not salt foods before eating. Do not eat more than 4 egg yolks a week. Try to eat at least 2 vegetarian meals a week. Eat more home-cooked food and less restaurant, buffet, and fast food. Lifestyle When eating at a restaurant, ask if your food can be made with less salt or no salt. If you drink alcohol: Limit how much you have to: 0-1 drink a day if you are male. 0-2 drinks a day if you are male. Know how much alcohol is in  your drink. In the [place] one drink is one 12 oz bottle of beer (355 mL), one 5 oz glass of wine (148 mL), or one 1 oz glass of hard liquor (44 mL). General information Avoid eating more than 2,300 mg of salt a day. If you have hypertension, you may need to reduce your sodium intake to 1,500 mg a day. Work with your provider to stay at a healthy body weight or lose weight. Ask what the best weight range is for you. On most days of the week, get at least 30 minutes of exercise that causes your heart to beat faster. This may include walking, swimming, or biking. Work with your provider or dietitian to adjust your eating plan to meet your specific calorie needs. What foods should I eat? Fruits All fresh, dried, or frozen fruit. Canned fruits that are in their natural juice and do not have sugar added to them. Vegetables Fresh or frozen vegetables that are raw, steamed, roasted, or grilled. Low-sodium or reduced-sodium tomato and vegetable juice. Low-sodium or reduced-sodium tomato sauce and tomato paste. Low-sodium or reduced-sodium canned vegetables. Grains Whole-grain or whole-wheat bread. Whole-grain or whole-wheat pasta. Brown rice. Orpah Cobb. Bulgur. Whole-grain and low-sodium cereals. Pita bread. Low-fat, low-sodium crackers. Whole-wheat flour tortillas. Meats and other proteins Skinless chicken or [place]. Ground chicken or [place]. Pork with fat trimmed off. Fish and seafood. Egg whites. Dried beans, peas, or lentils. Unsalted nuts, nut butters, and seeds. Unsalted canned beans. Lean cuts of beef with fat trimmed off. Low-sodium, lean precooked or cured meat, such as sausages or meat loaves. Dairy Low-fat (1%) or fat-free (skim) milk. Reduced-fat, low-fat, or fat-free cheeses. Nonfat, low-sodium ricotta or cottage cheese. Low-fat or nonfat yogurt. Low-fat, low-sodium cheese. Fats and oils Soft margarine without trans fats. Vegetable oil. Reduced-fat, low-fat, or light mayonnaise and salad  dressings (reduced-sodium). Canola, safflower, olive, avocado, soybean, and sunflower oils. Avocado. Seasonings and condiments Herbs. Spices. Seasoning mixes without salt. Other foods Unsalted popcorn and pretzels. Fat-free sweets. The items listed above may not be all the foods and drinks you can have. Talk to a dietitian to learn more. What foods should I avoid? Fruits Canned fruit in a light or heavy syrup. Fried fruit. Fruit in cream or butter sauce. Vegetables Creamed or fried vegetables. Vegetables in a cheese sauce. Regular canned vegetables that are not marked as low-sodium or reduced-sodium. Regular canned tomato sauce and paste that are not marked as low-sodium or reduced-sodium. Regular tomato and vegetable juices that are not marked as low-sodium or reduced-sodium. Rosita Fire. Olives. Grains Baked goods made with fat, such as croissants, muffins, or some breads. Dry pasta or rice meal packs. Meats and other proteins Fatty cuts of meat. Ribs. Fried meat. Tomasa Blase. Bologna, salami, and other precooked or cured meats, such as sausages or meat loaves, that are not lean and low in sodium. Fat from the back of a pig (fatback). Bratwurst. Salted nuts and seeds. Canned  beans with added salt. Canned or smoked fish. Whole eggs or egg yolks. Chicken or [place] with skin. Dairy Whole or 2% milk, cream, and half-and-half. Whole or full-fat cream cheese. Whole-fat or sweetened yogurt. Full-fat cheese. Nondairy creamers. Whipped toppings. Processed cheese and cheese spreads. Fats and oils Butter. Stick margarine. Lard. Shortening. Ghee. Bacon fat. Tropical oils, such as coconut, palm kernel, or palm oil. Seasonings and condiments Onion salt, garlic salt, seasoned salt, table salt, and sea salt. Worcestershire sauce. Tartar sauce. Barbecue sauce. Teriyaki sauce. Soy sauce, including reduced-sodium soy sauce. Steak sauce. Canned and packaged gravies. Fish sauce. Oyster sauce. Cocktail sauce. Store-bought  horseradish. Ketchup. Mustard. Meat flavorings and tenderizers. Bouillon cubes. Hot sauces. Pre-made or packaged marinades. Pre-made or packaged taco seasonings. Relishes. Regular salad dressings. Other foods Salted popcorn and pretzels. The items listed above may not be all the foods and drinks you should avoid. Talk to a dietitian to learn more. Where to find more information National Heart, Lung, and Blood Institute (NHLBI): BuffaloDryCleaner.gl American Heart Association (AHA): heart.org Academy of Nutrition and Dietetics: eatright.org National Kidney Foundation (NKF): kidney.org This information is not intended to replace advice given to you by your health care provider. Make sure you discuss any questions you have with your health care provider. Document Revised: 02/07/2022 Document Reviewed: 02/07/2022 Elsevier Patient Education  2024 [employer].

## 2022-10-24 LAB — COMPREHENSIVE METABOLIC PANEL
ALT: 18 IU/L (ref 0–44)
AST: 18 IU/L (ref 0–40)
Albumin: 4.3 g/dL (ref 3.9–4.9)
Alkaline Phosphatase: 75 IU/L (ref 44–121)
BUN/Creatinine Ratio: 16 (ref 10–24)
BUN: 18 mg/dL (ref 8–27)
Bilirubin Total: 0.4 mg/dL (ref 0.0–1.2)
CO2: 24 mmol/L (ref 20–29)
Calcium: 9.3 mg/dL (ref 8.6–10.2)
Chloride: 103 mmol/L (ref 96–106)
Creatinine, Ser: 1.12 mg/dL (ref 0.76–1.27)
Globulin, Total: 2.2 g/dL (ref 1.5–4.5)
Glucose: 103 mg/dL — ABNORMAL HIGH (ref 70–99)
Potassium: 4.5 mmol/L (ref 3.5–5.2)
Sodium: 141 mmol/L (ref 134–144)
Total Protein: 6.5 g/dL (ref 6.0–8.5)
eGFR: 72 mL/min/{1.73_m2} (ref >59)

## 2022-10-24 LAB — TESTOSTERONE,FREE AND TOTAL
Testosterone, Free: 9.2 pg/mL (ref 6.6–18.1)
Testosterone: 588 ng/dL (ref 264–916)

## 2022-10-24 LAB — CBC
Hematocrit: 49.2 % (ref 37.5–51.0)
Hemoglobin: 16.3 g/dL (ref 13.0–17.7)
MCH: 32.8 pg (ref 26.6–33.0)
MCHC: 33.1 g/dL (ref 31.5–35.7)
MCV: 99 fL — ABNORMAL HIGH (ref 79–97)
Platelets: 192 10*3/uL (ref 150–450)
RBC: 4.97 x10E6/uL (ref 4.14–5.80)
RDW: 11.5 % — ABNORMAL LOW (ref 11.6–15.4)
WBC: 6 10*3/uL (ref 3.4–10.8)

## 2022-10-24 LAB — PSA: Prostate Specific Ag, Serum: 1.9 ng/mL (ref 0.0–4.0)

## 2022-10-28 ENCOUNTER — Ambulatory Visit: Payer: Medicare Other | Admitting: Urology

## 2022-10-28 ENCOUNTER — Other Ambulatory Visit: Payer: Self-pay | Admitting: Internal Medicine

## 2022-10-28 VITALS — BP 106/78 | HR 80

## 2022-10-28 DIAGNOSIS — N401 Enlarged prostate with lower urinary tract symptoms: Secondary | ICD-10-CM

## 2022-10-28 DIAGNOSIS — R350 Frequency of micturition: Secondary | ICD-10-CM

## 2022-10-28 DIAGNOSIS — R351 Nocturia: Secondary | ICD-10-CM | POA: Diagnosis not present

## 2022-10-28 DIAGNOSIS — Z8639 Personal history of other endocrine, nutritional and metabolic disease: Secondary | ICD-10-CM

## 2022-10-28 DIAGNOSIS — N400 Enlarged prostate without lower urinary tract symptoms: Secondary | ICD-10-CM

## 2022-10-28 DIAGNOSIS — F418 Other specified anxiety disorders: Secondary | ICD-10-CM

## 2022-10-28 MED ORDER — TAMSULOSIN HCL 0.4 MG PO CAPS
ORAL_CAPSULE | ORAL | 3 refills | Status: DC
Start: 2022-10-28 — End: 2023-10-29

## 2022-10-28 MED ORDER — LORAZEPAM 0.5 MG PO TABS
0.5000 mg | ORAL_TABLET | Freq: Every day | ORAL | 0 refills | Status: DC | PRN
Start: 2022-10-28 — End: 2023-05-12

## 2022-10-28 MED ORDER — TESTOSTERONE CYPIONATE 200 MG/ML IM SOLN: 100.0000 mg | INTRAMUSCULAR | 1 refills | Status: DC

## 2022-10-28 MED ORDER — TADALAFIL 20 MG PO TABS: 20.0000 mg | ORAL_TABLET | Freq: Every day | ORAL | 5 refills | Status: DC | PRN

## 2022-10-28 NOTE — Progress Notes (Unsigned)
10/28/2022 9:10 AM   Justin Carroll 1955/02/23 [id number]  Referring provider: Billie Lade, MD [address] [place],  [place] [address]  No chief complaint on file.   HPI: Testosterone 588. Good energy. Hypotension improved after BP meds were adjusted. IPSS 3 QOL 1 on flomax 0.4mg     PMH: Past Medical History:  Diagnosis Date   Allergy Lifetime   Mostly pollen   Anxiety Not sure   Ativan prn. A few times a month   CKD (chronic kidney disease) stage 2, GFR 60-89 ml/min 05/07/2022   Depression    Encounter for general adult medical examination with abnormal findings 05/07/2022   GERD (gastroesophageal reflux disease) 5 years   Myocardial infarction (HCC) 10/17/2020   Stent   Sinus infection    yearly   Sleep apnea Diagnosed 2022   CPAP   Substance abuse (HCC) 40+ years ago   Sober since 11/19/1985    Surgical History: Past Surgical History:  Procedure Laterality Date   ADENOIDECTOMY     CORONARY IMAGING/OCT N/A 10/18/2020   Procedure: INTRAVASCULAR IMAGING/OCT;  Surgeon: Iran Ouch, MD;  Location: [facility];  Service: Cardiovascular;  Laterality: N/A;   CORONARY STENT INTERVENTION N/A 10/18/2020   Procedure: CORONARY STENT INTERVENTION;  Surgeon: Iran Ouch, MD;  Location: [facility];  Service: Cardiovascular;  Laterality: N/A;   LEFT HEART CATH AND CORONARY ANGIOGRAPHY N/A 10/18/2020   Procedure: LEFT HEART CATH AND CORONARY ANGIOGRAPHY;  Surgeon: Iran Ouch, MD;  Location: [facility];  Service: Cardiovascular;  Laterality: N/A;   LEFT HEART CATH AND CORONARY ANGIOGRAPHY N/A 09/30/2022   Procedure: LEFT HEART CATH AND CORONARY ANGIOGRAPHY;  Surgeon: Runell Gess, MD;  Location: [facility];  Service: Cardiovascular;  Laterality: N/A;   NASAL SEPTUM SURGERY     SINOSCOPY     TONSILLECTOMY     age 67    Home Medications:  Allergies as of 10/28/2022   No Known Allergies      Medication List         Accurate as of October 28, 2022  9:10 AM. If you have any questions, ask your nurse or doctor.          atomoxetine 40 MG capsule Commonly known as: Strattera Take 1 capsule (40 mg total) by mouth daily.   atorvastatin 80 MG tablet Commonly known as: LIPITOR TAKE ONE (1) TABLET BY MOUTH EVERY DAY   Azelastine HCl 137 MCG/SPRAY Soln USE 2 SPRAY(S) IN EACH NOSTRIL TWICE DAILY AS NEEDED FOR RHINITIS   buPROPion 300 MG 24 hr tablet Commonly known as: WELLBUTRIN XL Take 1 tablet (300 mg total) by mouth daily.   carvedilol 6.25 MG tablet Commonly known as: COREG Take 1 tablet (6.25 mg total) by mouth 2 (two) times daily with a meal.   clopidogrel 75 MG tablet Commonly known as: PLAVIX Take 1 tablet (75 mg total) by mouth daily.   dapagliflozin propanediol 10 MG Tabs tablet Commonly known as: FARXIGA Take 1 tablet (10 mg total) by mouth daily.   Entresto 24-26 MG Generic drug: sacubitril-valsartan Take 1 tablet by mouth 2 (two) times daily.   levocetirizine 5 MG tablet Commonly known as: XYZAL Take 5 mg by mouth every evening.   LORazepam 0.5 MG tablet Commonly known as: ATIVAN Take 1 tablet (0.5 mg total) by mouth daily as needed for anxiety.   mometasone 50 MCG/ACT nasal spray Commonly known as:  NASONEX Place 1 spray into the nose daily.   nitroGLYCERIN 0.4 MG SL tablet Commonly known as: NITROSTAT Place 1 tablet (0.4 mg total) under the tongue every 5 (five) minutes as needed for chest pain.   pantoprazole 40 MG tablet Commonly known as: PROTONIX TAKE ONE TABLET (40MG  TOTAL) BY MOUTH DAILY   Repatha SureClick 140 MG/ML Soaj Generic drug: Evolocumab Inject 140 mg into the skin every 14 (fourteen) days.   spironolactone 25 MG tablet Commonly known as: ALDACTONE TAKE ONE TABLET (25 MG TOTAL) BY MOUTH AT BEDTIME.   tadalafil 20 MG tablet Commonly known as: CIALIS Take 1 tablet (20 mg total) by mouth daily as needed.   tamsulosin 0.4 MG Caps  capsule Commonly known as: FLOMAX TAKE ONE (1) TABLET BY MOUTH EVERY DAY AFTER SUPPER   testosterone cypionate 200 MG/ML injection Commonly known as: DEPOTESTOSTERONE CYPIONATE Inject 0.5 mLs (100 mg total) into the muscle once a week.   traZODone 100 MG tablet Commonly known as: DESYREL Take 1 tablet (100 mg total) by mouth at bedtime.   triamcinolone cream 0.1 % Commonly known as: KENALOG Apply 1 Application topically daily as needed (irritation/rash).   VITAMIN D-3 PO Take 2,000 Units by mouth daily.        Allergies: No Known Allergies  Family History: Family History  Problem Relation Age of Onset   Stroke Mother    Alcohol abuse Father    Heart disease Father    Allergic rhinitis Brother    Alcohol abuse Brother    Anxiety disorder Brother    Depression Brother    Depression Brother    Drug abuse Brother    Stroke Brother    Heart disease Paternal Uncle    Heart disease Paternal Uncle    Colon cancer Neg Hx    Pancreatic cancer Neg Hx    Rectal cancer Neg Hx    Stomach cancer Neg Hx     Social History:  reports that he quit smoking about 9 years ago. His smoking use included cigarettes. He started smoking about 29 years ago. He has a 30 pack-year smoking history. He has never used smokeless tobacco. He reports that he does not currently use alcohol. He reports that he does not use drugs.  ROS: All other review of systems were reviewed and are negative except what is noted above in HPI  Physical Exam: BP 106/78   Pulse 80   Constitutional:  Alert and oriented, No acute distress. HEENT: [place] AT, moist mucus membranes.  Trachea midline, no masses. Cardiovascular: No clubbing, cyanosis, or edema. Respiratory: Normal respiratory effort, no increased work of breathing. GI: Abdomen is soft, nontender, nondistended, no abdominal masses GU: No CVA tenderness.  Lymph: No cervical or inguinal lymphadenopathy. Skin: No rashes, bruises or suspicious  lesions. Neurologic: Grossly intact, no focal deficits, moving all 4 extremities. Psychiatric: Normal mood and affect.  Laboratory Data: Lab Results  Component Value Date   WBC 6.0 10/21/2022   HGB 16.3 10/21/2022   HCT 49.2 10/21/2022   MCV 99 (H) 10/21/2022   PLT 192 10/21/2022    Lab Results  Component Value Date   CREATININE 1.12 10/21/2022    No results found for: "PSA"  Lab Results  Component Value Date   TESTOSTERONE 588 10/21/2022    Lab Results  Component Value Date   HGBA1C 6.7 (H) 05/07/2022    Urinalysis    Component Value Date/Time   COLORURINE DARK YELLOW 05/17/2020 0000   APPEARANCEUR Clear  07/31/2022 0847   LABSPEC 1.025 05/17/2020 0000   PHURINE < OR = 5.0 05/17/2020 0000   GLUCOSEU 3+ (A) 07/31/2022 0847   HGBUR NEGATIVE 05/17/2020 0000   BILIRUBINUR Negative 07/31/2022 0847   KETONESUR 1+ (A) 05/17/2020 0000   PROTEINUR Negative 07/31/2022 0847   PROTEINUR NEGATIVE 05/17/2020 0000   NITRITE Negative 07/31/2022 0847   LEUKOCYTESUR Negative 07/31/2022 0847    Lab Results  Component Value Date   LABMICR Comment 07/31/2022   BACTERIA NONE SEEN 05/17/2020    Pertinent Imaging: *** No results found for this or any previous visit.  No results found for this or any previous visit.  No results found for this or any previous visit.  No results found for this or any previous visit.  No results found for this or any previous visit.  No valid procedures specified. No results found for this or any previous visit.  No results found for this or any previous visit.   Assessment & Plan:    1. History of hypogonadism -continue IM testosterone 100mg  every week -followuop 6 months with labs  2. Benign prostatic hyperplasia with urinary frequency -continue flomax 0.4mg  daily  3. Nocturia -continue flomax 0.4mg  daily  4. Benign prostatic hyperplasia without lower urinary tract symptoms ***   No follow-ups on file.  Wilkie Aye, MD  [facility]

## 2022-10-29 ENCOUNTER — Encounter: Payer: Self-pay | Admitting: Urology

## 2022-10-29 NOTE — Patient Instructions (Signed)

## 2022-11-11 ENCOUNTER — Encounter: Payer: Self-pay | Admitting: Internal Medicine

## 2022-11-11 ENCOUNTER — Ambulatory Visit: Payer: Medicare Other | Admitting: Internal Medicine

## 2022-11-11 ENCOUNTER — Encounter: Payer: Self-pay | Admitting: *Deleted

## 2022-11-11 ENCOUNTER — Other Ambulatory Visit: Payer: Self-pay

## 2022-11-11 VITALS — BP 99/65 | HR 76 | Ht 66.0 in | Wt 171.2 lb

## 2022-11-11 DIAGNOSIS — Z8639 Personal history of other endocrine, nutritional and metabolic disease: Secondary | ICD-10-CM | POA: Diagnosis not present

## 2022-11-11 DIAGNOSIS — I255 Ischemic cardiomyopathy: Secondary | ICD-10-CM

## 2022-11-11 DIAGNOSIS — N182 Chronic kidney disease, stage 2 (mild): Secondary | ICD-10-CM

## 2022-11-11 DIAGNOSIS — I2510 Atherosclerotic heart disease of native coronary artery without angina pectoris: Secondary | ICD-10-CM | POA: Diagnosis not present

## 2022-11-11 DIAGNOSIS — E1122 Type 2 diabetes mellitus with diabetic chronic kidney disease: Secondary | ICD-10-CM | POA: Diagnosis not present

## 2022-11-11 DIAGNOSIS — Z23 Encounter for immunization: Secondary | ICD-10-CM

## 2022-11-11 DIAGNOSIS — Z7984 Long term (current) use of oral hypoglycemic drugs: Secondary | ICD-10-CM | POA: Diagnosis not present

## 2022-11-11 DIAGNOSIS — Z1211 Encounter for screening for malignant neoplasm of colon: Secondary | ICD-10-CM | POA: Insufficient documentation

## 2022-11-11 NOTE — Assessment & Plan Note (Signed)
Due for repeat screening colonoscopy.  Gastroenterology referral placed today.

## 2022-11-11 NOTE — Assessment & Plan Note (Signed)
EF 45-50% on TTE updated in August.  Euvolemic on exam today.  He is currently prescribed carvedilol, Farxiga, Entresto, and spironolactone.  Carvedilol dose was reduced to 6.25 mg twice daily in the setting of fatigue and hypotension.  Symptoms have essentially resolved. -No additional medication changes are indicated today.

## 2022-11-11 NOTE — Progress Notes (Signed)
Established Patient Office Visit  Subjective   Patient ID: Justin Carroll, male    DOB: 04-11-1955  Age: 67 y.o. MRN: [id number]  Chief Complaint  Patient presents with   Hypotension    Recently diagnosed. Medications have changed.    Justin Carroll returns to care today for routine follow-up.  He was last evaluated by me on 5/6 for follow-up of diabetes mellitus.  No medication change from at that time.  He was referred to urology due to a history of hypogonadism.  In the end, he has established care with urology (Dr. Ronne Binning).  ED presentation on 8/6 endorsing chest pain.  Cardiac workup was reassuring.  He followed up with cardiology (Dr. Gery Pray).  Underwent LHC on 8/26 given known history of CAD and worsening fatigue.  Previous mid circumflex stent was widely patent.  No other obstructive disease identified.  EF stable at 45-50% on TTE updated on 8/12.  He was evaluated by cardiology for follow-up on 9/17.  Carvedilol reduced to 6.25 mg twice daily at that time, which he reports today has significantly improved symptoms of fatigue. Justin Carroll reports feeling well today.  He is asymptomatic and has no acute concerns to discuss.  Past Medical History:  Diagnosis Date   Allergy Lifetime   Mostly pollen   Anxiety Not sure   Ativan prn. A few times a month   CKD (chronic kidney disease) stage 2, GFR 60-89 ml/min 05/07/2022   Depression    Encounter for general adult medical examination with abnormal findings 05/07/2022   GERD (gastroesophageal reflux disease) 5 years   Myocardial infarction (HCC) 10/17/2020   Stent   Sinus infection    yearly   Sleep apnea Diagnosed 2022   CPAP   Substance abuse (HCC) 40+ years ago   Sober since 11/19/1985   Past Surgical History:  Procedure Laterality Date   ADENOIDECTOMY     CORONARY IMAGING/OCT N/A 10/18/2020   Procedure: INTRAVASCULAR IMAGING/OCT;  Surgeon: Iran Ouch, MD;  Location: [facility];  Service: Cardiovascular;  Laterality:  N/A;   CORONARY STENT INTERVENTION N/A 10/18/2020   Procedure: CORONARY STENT INTERVENTION;  Surgeon: Iran Ouch, MD;  Location: [facility];  Service: Cardiovascular;  Laterality: N/A;   LEFT HEART CATH AND CORONARY ANGIOGRAPHY N/A 10/18/2020   Procedure: LEFT HEART CATH AND CORONARY ANGIOGRAPHY;  Surgeon: Iran Ouch, MD;  Location: [facility];  Service: Cardiovascular;  Laterality: N/A;   LEFT HEART CATH AND CORONARY ANGIOGRAPHY N/A 09/30/2022   Procedure: LEFT HEART CATH AND CORONARY ANGIOGRAPHY;  Surgeon: Runell Gess, MD;  Location: [facility];  Service: Cardiovascular;  Laterality: N/A;   NASAL SEPTUM SURGERY     SINOSCOPY     TONSILLECTOMY     age 23   Social History   Tobacco Use   Smoking status: Former    Current packs/day: 0.00    Average packs/day: 1.5 packs/day for 20.0 years (30.0 ttl pk-yrs)    Types: Cigarettes    Start date: 17    Quit date: 02/04/2013    Years since quitting: 9.7   Smokeless tobacco: Never  Vaping Use   Vaping status: Never Used  Substance Use Topics   Alcohol use: Not Currently   Drug use: No   Family History  Problem Relation Age of Onset   Stroke Mother    Alcohol abuse Father    Heart disease Father    Allergic rhinitis Brother  Alcohol abuse Brother    Anxiety disorder Brother    Depression Brother    Depression Brother    Drug abuse Brother    Stroke Brother    Heart disease Paternal Uncle    Heart disease Paternal Uncle    Colon cancer Neg Hx    Pancreatic cancer Neg Hx    Rectal cancer Neg Hx    Stomach cancer Neg Hx    No Known Allergies    Review of Systems  Constitutional:  Negative for chills and fever.  HENT:  Negative for sore throat.   Respiratory:  Negative for cough and shortness of breath.   Cardiovascular:  Negative for chest pain, palpitations and leg swelling.  Gastrointestinal:  Negative for abdominal pain, blood in stool, constipation, diarrhea, nausea and  vomiting.  Genitourinary:  Negative for dysuria and hematuria.  Musculoskeletal:  Negative for myalgias.  Skin:  Negative for itching and rash.  Neurological:  Negative for dizziness and headaches.  Psychiatric/Behavioral:  Negative for depression and suicidal ideas.      Objective:     BP 99/65 (BP Location: Left Arm, Patient Position: Sitting, Cuff Size: Normal)   Pulse 76   Ht 5\' 6"  (1.676 m)   Wt 171 lb 3.2 oz (77.7 kg)   SpO2 93%   BMI 27.63 kg/m  BP Readings from Last 3 Encounters:  11/11/22 99/65  10/28/22 106/78  10/22/22 90/60   Physical Exam Vitals reviewed.  Constitutional:      General: He is not in acute distress.    Appearance: Normal appearance. He is not ill-appearing.  HENT:     Head: Normocephalic and atraumatic.     Right Ear: External ear normal.     Left Ear: External ear normal.     Nose: Nose normal. No congestion or rhinorrhea.     Mouth/Throat:     Mouth: Mucous membranes are moist.     Pharynx: Oropharynx is clear.  Eyes:     General: No scleral icterus.    Extraocular Movements: Extraocular movements intact.     Conjunctiva/sclera: Conjunctivae normal.     Pupils: Pupils are equal, round, and reactive to light.  Cardiovascular:     Rate and Rhythm: Normal rate and regular rhythm.     Pulses: Normal pulses.     Heart sounds: Normal heart sounds. No murmur heard. Pulmonary:     Effort: Pulmonary effort is normal.     Breath sounds: Normal breath sounds. No wheezing, rhonchi or rales.  Abdominal:     General: Abdomen is flat. Bowel sounds are normal. There is no distension.     Palpations: Abdomen is soft.     Tenderness: There is no abdominal tenderness.  Musculoskeletal:        General: No swelling or deformity. Normal range of motion.     Cervical back: Normal range of motion.  Skin:    General: Skin is warm and dry.     Capillary Refill: Capillary refill takes less than 2 seconds.  Neurological:     General: No focal deficit  present.     Mental Status: He is alert and oriented to person, place, and time.     Motor: No weakness.  Psychiatric:        Mood and Affect: Mood normal.        Behavior: Behavior normal.        Thought Content: Thought content normal.   Last CBC Lab Results  Component Value Date  WBC 6.0 10/21/2022   HGB 16.3 10/21/2022   HCT 49.2 10/21/2022   MCV 99 (H) 10/21/2022   MCH 32.8 10/21/2022   RDW 11.5 (L) 10/21/2022   PLT 192 10/21/2022   Last metabolic panel Lab Results  Component Value Date   GLUCOSE 103 (H) 10/21/2022   NA 141 10/21/2022   K 4.5 10/21/2022   CL 103 10/21/2022   CO2 24 10/21/2022   BUN 18 10/21/2022   CREATININE 1.12 10/21/2022   EGFR 72 10/21/2022   CALCIUM 9.3 10/21/2022   PROT 6.5 10/21/2022   ALBUMIN 4.3 10/21/2022   LABGLOB 2.2 10/21/2022   AGRATIO 1.8 05/07/2022   BILITOT 0.4 10/21/2022   ALKPHOS 75 10/21/2022   AST 18 10/21/2022   ALT 18 10/21/2022   ANIONGAP 13 09/10/2022   Last lipids Lab Results  Component Value Date   CHOL 113 05/07/2022   HDL 63 05/07/2022   LDLCALC 26 05/07/2022   TRIG 142 05/07/2022   CHOLHDL 1.8 05/07/2022   Last hemoglobin A1c Lab Results  Component Value Date   HGBA1C 6.7 (H) 05/07/2022   Last thyroid functions Lab Results  Component Value Date   TSH 1.090 05/07/2022   Last vitamin D Lab Results  Component Value Date   VD25OH 50.8 05/07/2022   Last vitamin B12 and Folate Lab Results  Component Value Date   VITAMINB12 446 05/07/2022   FOLATE 5.3 05/07/2022     Assessment & Plan:   Problem List Items Addressed This Visit       CAD (coronary artery disease)    Known history of CAD.  ER presentation in August endorsing chest pain.  Cardiac workup reassuring at that time.  Underwent repeat LHC on 8/26 with Dr. Gery Pray.  Mid circumflex stent widely patent.  No other obstructive disease identified.  He remains on Plavix, atorvastatin, Repatha, and carvedilol. -No medication changes are indicated  at this time      Ischemic cardiomyopathy    EF 45-50% on TTE updated in August.  Euvolemic on exam today.  He is currently prescribed carvedilol, Farxiga, Entresto, and spironolactone.  Carvedilol dose was reduced to 6.25 mg twice daily in the setting of fatigue and hypotension.  Symptoms have essentially resolved. -No additional medication changes are indicated today.      Type 2 diabetes mellitus with diabetic chronic kidney disease (HCC)    Recent diagnosis, meeting criteria with A1c 6.7 on labs from April.  He is currently prescribed Farxiga.  When last discussed, he preferred to focus on dietary changes aimed at improving his blood sugar. -Repeat A1c ordered today -Continue Farxiga 10 mg daily -Diabetes related preventative care items are up-to-date      History of hypogonadism    Urology referral placed at his last appointment.  He has established care with Dr. Ronne Binning and has been started on TRT.      Colon cancer screening - Primary    Due for repeat screening colonoscopy.  Gastroenterology referral placed today.      Need for influenza vaccination    Influenza vaccine administered today      Return in about 6 months (around 05/12/2023).   Billie Lade, MD

## 2022-11-11 NOTE — Assessment & Plan Note (Signed)
Known history of CAD.  ER presentation in August endorsing chest pain.  Cardiac workup reassuring at that time.  Underwent repeat LHC on 8/26 with Dr. Gery Pray.  Mid circumflex stent widely patent.  No other obstructive disease identified.  He remains on Plavix, atorvastatin, Repatha, and carvedilol. -No medication changes are indicated at this time

## 2022-11-11 NOTE — Assessment & Plan Note (Signed)
Influenza vaccine administered today.

## 2022-11-11 NOTE — Patient Instructions (Signed)
It was a pleasure to see you today.  Thank you for giving [place] the opportunity to be involved in your care.  Below is a brief recap of your visit and next steps.  We will plan to see you again in 6 months.  Summary No medication changes today Repeat A1c GI referral for colonoscopy Flu shot today Follow up in 6 months

## 2022-11-11 NOTE — Assessment & Plan Note (Signed)
Urology referral placed at his last appointment.  He has established care with Dr. Ronne Binning and has been started on TRT.

## 2022-11-11 NOTE — Assessment & Plan Note (Signed)
Recent diagnosis, meeting criteria with A1c 6.7 on labs from April.  He is currently prescribed Farxiga.  When last discussed, he preferred to focus on dietary changes aimed at improving his blood sugar. -Repeat A1c ordered today -Continue Farxiga 10 mg daily -Diabetes related preventative care items are up-to-date

## 2022-11-12 LAB — BAYER DCA HB A1C WAIVED: HB A1C (BAYER DCA - WAIVED): 5.9 % — ABNORMAL HIGH (ref 4.8–5.6)

## 2022-11-14 ENCOUNTER — Encounter: Payer: Medicare Other | Attending: Psychology | Admitting: Psychology

## 2022-11-14 DIAGNOSIS — I679 Cerebrovascular disease, unspecified: Secondary | ICD-10-CM | POA: Diagnosis not present

## 2022-11-14 DIAGNOSIS — R413 Other amnesia: Secondary | ICD-10-CM | POA: Insufficient documentation

## 2022-11-14 DIAGNOSIS — R41840 Attention and concentration deficit: Secondary | ICD-10-CM | POA: Insufficient documentation

## 2022-11-15 NOTE — Progress Notes (Signed)
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 recognition formats not typical of Alzheimer's or frontotemporal dementia.  The patient is experiencing some verbal fluency changes but objective assessment of semantic and lexical fluency did not show any significant deficits but potentially a loss from excellent premorbid functioning.  The patient has had a heart attack previously  and ongoing cardiovascular health issues including mild to moderate reduced cognitive functioning, which would suggest cardiovascular and likely cerebrovascular changes.  He is described a lifelong issue with attention and concentration and significant worsening of attention and concentration recently.  While the MRI had significant limitations in interpretation due to motion degradation there were indications of some microvascular ischemic and small vessel changes.  The patient also had recent PET scan conducted in January 2021 due to concerns of possible frontotemporal dementia versus Alzheimer's type dementia.  The results showed no decreased cortical metabolism  within the parietal lobes to suggest Alzheimer's pathology and no decreased frontal lobe cortical activity to suggest frontotemporal dementia.  While this PET scan is not diagnostic in and of itself it is consistent with results of the current neuropsychological evaluation.  I suspect that the primary culprit for his observed cognitive changes have to do with cerebrovascular changes and small vessel disease in conjunction with pre-existing attentional deficits.  However, given the fact that we may be early in the assessment as the patient had likely performed in the superior range and there is a global decrease in cognitive functioning from these premorbid estimates it will be important to have repeat testing in approximately 9 months or so to add in the strength of this cognitive assessment.  We cannot completely rule out the possibility of a frontotemporal dementia given his excellent premorbid level of cognitive functioning but at this point the primary deficits appear to be attentional component including information processing speed and focus execute abilities and significant impairment with visual encoding and retrieval of information rather than an inability to effectively store and organize and retain that information over a period of delay.  There is considerable indication of lateralization with greater deficits for right hemisphere type functions versus left hemisphere type functions.  I will sit down with the patient and go over the results of the recent neuropsychological evaluation and the plan and need for repeat testing in approximately 9 to 12 months.  Diagnosis:    Memory loss, short term  Attention and concentration deficit  Cognitive attention deficit  Small vessel disease, cerebrovascular   _____________________ Arley Phenix, Psy.D. Clinical Neuropsychologist

## 2022-12-20 ENCOUNTER — Ambulatory Visit (INDEPENDENT_AMBULATORY_CARE_PROVIDER_SITE_OTHER): Payer: Medicare Other

## 2022-12-20 ENCOUNTER — Other Ambulatory Visit: Payer: Self-pay | Admitting: Neurology

## 2022-12-20 ENCOUNTER — Other Ambulatory Visit: Payer: Self-pay | Admitting: General Practice

## 2022-12-20 VITALS — Ht 66.0 in | Wt 171.0 lb

## 2022-12-20 DIAGNOSIS — Z122 Encounter for screening for malignant neoplasm of respiratory organs: Secondary | ICD-10-CM

## 2022-12-20 DIAGNOSIS — Z0000 Encounter for general adult medical examination without abnormal findings: Secondary | ICD-10-CM | POA: Diagnosis not present

## 2022-12-20 DIAGNOSIS — Z1211 Encounter for screening for malignant neoplasm of colon: Secondary | ICD-10-CM

## 2022-12-20 NOTE — Patient Instructions (Signed)
Mr. Justin Carroll , Thank you for taking time to come for your Medicare Wellness Visit. I appreciate your ongoing commitment to your health goals. Please review the following plan we discussed and let me know if I can assist you in the future.   Referrals/Orders/Follow-Ups/Clinician Recommendations: Aim for 30 minutes of exercise or brisk walking, 6-8 glasses of water, and 5 servings of fruits and vegetables each day.   This is a list of the screening recommended for you and due dates:  Health Maintenance  Topic Date Due   DTaP/Tdap/Td vaccine (1 - Tdap) Never done   Zoster (Shingles) Vaccine (1 of 2) Never done   Colon Cancer Screening  07/29/2018   COVID-19 Vaccine (3 - Moderna risk series) 06/25/2019   Screening for Lung Cancer  09/30/2022   Hemoglobin A1C  05/12/2023   Complete foot exam   06/10/2023   Yearly kidney health urinalysis for diabetes  06/26/2023   Eye exam for diabetics  10/16/2023   Yearly kidney function blood test for diabetes  10/21/2023   Medicare Annual Wellness Visit  12/20/2023   Pneumonia Vaccine  Completed   Flu Shot  Completed   Hepatitis C Screening  Completed   HPV Vaccine  Aged Out    Advanced directives: (Provided) Advance directive discussed with you today. I have provided a copy for you to complete at home and have notarized. Once this is complete, please bring a copy in to our office so we can scan it into your chart. Information on Advanced Care Planning can be found at [facility] Advance Health Care Directives Advance Health Care Directives ([email or web address]    Next Medicare Annual Wellness Visit scheduled for next year: Yes  insert Preventive Care attachment Insert FALL PREVENTION attachment if needed

## 2022-12-20 NOTE — Progress Notes (Signed)
Subjective:   Justin Carroll is a 67 y.o. male who presents for Medicare Annual/Subsequent preventive examination.  Visit Complete: Virtual I connected with  Tyrone Sage on 12/20/22 by a audio enabled telemedicine application and verified that I am speaking with the correct person using two identifiers.  Patient Location: Home  Provider Location: Home Office  I discussed the limitations of evaluation and management by telemedicine. The patient expressed understanding and agreed to proceed.  Vital Signs: Because this visit was a virtual/telehealth visit, some criteria may be missing or patient reported. Any vitals not documented were not able to be obtained and vitals that have been documented are patient reported.  Patient Medicare AWV questionnaire was completed by the patient on 12/19/2022; I have confirmed that all information answered by patient is correct and no changes since this date.  Cardiac Risk Factors include: advanced age (>32men, >47 women);diabetes mellitus;dyslipidemia;male gender;hypertension     Objective:    Today's Vitals   12/20/22 1541  Weight: 171 lb (77.6 kg)  Height: 5\' 6"  (1.676 m)   Body mass index is 27.6 kg/m.     12/20/2022    3:48 PM 09/10/2022    1:18 PM 11/10/2020   12:48 PM 10/17/2020    4:04 PM 07/22/2013    3:36 PM  Advanced Directives  Does Patient Have a Medical Advance Directive? No No No No Patient does not have advance directive  Would patient like information on creating a medical advance directive? Yes (MAU/Ambulatory/Procedural Areas - Information given) No - Patient declined No - Patient declined No - Patient declined   Pre-existing out of facility DNR order (yellow form or pink MOST form)     No    Current Medications (verified) Outpatient Encounter Medications as of 12/20/2022  Medication Sig   atomoxetine (STRATTERA) 40 MG capsule Take 1 capsule (40 mg total) by mouth daily.   atorvastatin (LIPITOR) 80 MG tablet TAKE ONE  (1) TABLET BY MOUTH EVERY DAY   Azelastine HCl 137 MCG/SPRAY SOLN USE 2 SPRAY(S) IN EACH NOSTRIL TWICE DAILY AS NEEDED FOR RHINITIS   buPROPion (WELLBUTRIN XL) 300 MG 24 hr tablet Take 1 tablet (300 mg total) by mouth daily.   carvedilol (COREG) 6.25 MG tablet TAKE ONE TABLET (6.25MG  TOTAL) BY MOUTH TWO TIMES DAILY WITH A MEAL   Cholecalciferol (VITAMIN D-3 PO) Take 2,000 Units by mouth daily.   clopidogrel (PLAVIX) 75 MG tablet Take 1 tablet (75 mg total) by mouth daily.   dapagliflozin propanediol (FARXIGA) 10 MG TABS tablet Take 1 tablet (10 mg total) by mouth daily.   Evolocumab (REPATHA SURECLICK) 140 MG/ML SOAJ Inject 140 mg into the skin every 14 (fourteen) days.   levocetirizine (XYZAL) 5 MG tablet Take 5 mg by mouth every evening.   LORazepam (ATIVAN) 0.5 MG tablet Take 1 tablet (0.5 mg total) by mouth daily as needed for anxiety.   mometasone (NASONEX) 50 MCG/ACT nasal spray Place 1 spray into the nose daily.   nitroGLYCERIN (NITROSTAT) 0.4 MG SL tablet Place 1 tablet (0.4 mg total) under the tongue every 5 (five) minutes as needed for chest pain.   pantoprazole (PROTONIX) 40 MG tablet TAKE ONE TABLET (40MG  TOTAL) BY MOUTH DAILY   sacubitril-valsartan (ENTRESTO) 24-26 MG Take 1 tablet by mouth 2 (two) times daily.   spironolactone (ALDACTONE) 25 MG tablet TAKE ONE TABLET (25 MG TOTAL) BY MOUTH AT BEDTIME.   tadalafil (CIALIS) 20 MG tablet Take 1 tablet (20 mg total) by mouth daily  as needed.   tamsulosin (FLOMAX) 0.4 MG CAPS capsule TAKE ONE (1) TABLET BY MOUTH EVERY DAY AFTER SUPPER   testosterone cypionate (DEPOTESTOSTERONE CYPIONATE) 200 MG/ML injection Inject 0.5 mLs (100 mg total) into the muscle once a week.   traZODone (DESYREL) 100 MG tablet Take 1 tablet (100 mg total) by mouth at bedtime.   triamcinolone cream (KENALOG) 0.1 % Apply 1 Application topically daily as needed (irritation/rash).   No facility-administered encounter medications on file as of 12/20/2022.     Allergies (verified) Patient has no known allergies.   History: Past Medical History:  Diagnosis Date   Allergy Lifetime   Mostly pollen   Anxiety Not sure   Ativan prn. A few times a month   CKD (chronic kidney disease) stage 2, GFR 60-89 ml/min 05/07/2022   Depression    Encounter for general adult medical examination with abnormal findings 05/07/2022   GERD (gastroesophageal reflux disease) 5 years   Myocardial infarction (HCC) 10/17/2020   Stent   Sinus infection    yearly   Sleep apnea Diagnosed 2022   CPAP   Substance abuse (HCC) 40+ years ago   Sober since 11/19/1985   Past Surgical History:  Procedure Laterality Date   ADENOIDECTOMY     CORONARY IMAGING/OCT N/A 10/18/2020   Procedure: INTRAVASCULAR IMAGING/OCT;  Surgeon: Iran Ouch, MD;  Location: [facility];  Service: Cardiovascular;  Laterality: N/A;   CORONARY STENT INTERVENTION N/A 10/18/2020   Procedure: CORONARY STENT INTERVENTION;  Surgeon: Iran Ouch, MD;  Location: [facility];  Service: Cardiovascular;  Laterality: N/A;   LEFT HEART CATH AND CORONARY ANGIOGRAPHY N/A 10/18/2020   Procedure: LEFT HEART CATH AND CORONARY ANGIOGRAPHY;  Surgeon: Iran Ouch, MD;  Location: [facility];  Service: Cardiovascular;  Laterality: N/A;   LEFT HEART CATH AND CORONARY ANGIOGRAPHY N/A 09/30/2022   Procedure: LEFT HEART CATH AND CORONARY ANGIOGRAPHY;  Surgeon: Runell Gess, MD;  Location: [facility];  Service: Cardiovascular;  Laterality: N/A;   NASAL SEPTUM SURGERY     SINOSCOPY     TONSILLECTOMY     age 57   Family History  Problem Relation Age of Onset   Stroke Mother    Alcohol abuse Father    Heart disease Father    Allergic rhinitis Brother    Alcohol abuse Brother    Anxiety disorder Brother    Depression Brother    Depression Brother    Drug abuse Brother    Stroke Brother    Heart disease Paternal Uncle    Heart disease Paternal Uncle    Colon cancer  Neg Hx    Pancreatic cancer Neg Hx    Rectal cancer Neg Hx    Stomach cancer Neg Hx    Social History   Socioeconomic History   Marital status: Married    Spouse name: Leisure centre manager   Number of children: 7   Years of education: Not on file   Highest education level: Professional school degree (e.g., MD, DDS, DVM, JD)  Occupational History   Occupation: Lawyer    Comment: self employeed  Tobacco Use   Smoking status: Former    Current packs/day: 0.00    Average packs/day: 1.5 packs/day for 20.0 years (30.0 ttl pk-yrs)    Types: Cigarettes    Start date: 34    Quit date: 02/04/2013    Years since quitting: 9.8   Smokeless tobacco: Never  Vaping Use  Vaping status: Never Used  Substance and Sexual Activity   Alcohol use: Not Currently   Drug use: No   Sexual activity: Yes    Birth control/protection: Surgical  Other Topics Concern   Not on file  Social History Narrative   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   Social Determinants of Health   Financial Resource Strain: Low Risk  (12/20/2022)   Overall Financial Resource Strain (CARDIA)    Difficulty of Paying Living Expenses: Not hard at all  Food Insecurity: No Food Insecurity (12/20/2022)   Hunger Vital Sign    Worried About Running Out of Food in the Last Year: Never true    Ran Out of Food in the Last Year: Never true  Transportation Needs: No Transportation Needs (12/20/2022)   PRAPARE - Administrator, Civil Service (Medical): No    Lack of Transportation (Non-Medical): No  Physical Activity: Insufficiently Active (12/20/2022)   Exercise Vital Sign    Days of Exercise per Week: 3 days    Minutes of Exercise per Session: 30 min  Stress: No Stress Concern Present (12/20/2022)   [employer] of Occupational Health - Occupational Stress Questionnaire    Feeling of Stress : Not at all  Social Connections: Socially Integrated (12/20/2022)   Social Connection and Isolation Panel [NHANES]    Frequency of  Communication with Friends and Family: More than three times a week    Frequency of Social Gatherings with Friends and Family: More than three times a week    Attends Religious Services: More than 4 times per year    Active Member of [employer] or Organizations: Yes    Attends Engineer, structural: More than 4 times per year    Marital Status: Married    Tobacco Counseling Counseling given: Not Answered   Clinical Intake:  Pre-visit preparation completed: Yes  Pain : No/denies pain     Nutritional Risks: None Diabetes: No  How often do you need to have someone help you when you read instructions, pamphlets, or other written materials from your doctor or pharmacy?: 1 - Never  Interpreter Needed?: No  Information entered by :: Renie Ora, LPN   Activities of Daily Living    12/20/2022    3:48 PM 12/13/2022    8:55 PM  In your present state of health, do you have any difficulty performing the following activities:  Hearing? 0 0  Vision? 0 0  Difficulty concentrating or making decisions? 0 0  Walking or climbing stairs? 0 0  Dressing or bathing? 0 0  Doing errands, shopping? 0 0  Preparing Food and eating ? N N  Using the Toilet? N N  In the past six months, have you accidently leaked urine? N N  Do you have problems with loss of bowel control? N N  Managing your Medications? N N  Managing your Finances? N N  Housekeeping or managing your Housekeeping? N N    Patient Care Team: Billie Lade, MD as PCP - General (Internal Medicine) Runell Gess, MD as PCP - Cardiology (Cardiology) Quintella Reichert, MD as PCP - Sleep Medicine (Cardiology)  Indicate any recent Medical Services you may have received from other than [facility] providers in the past year (date may be approximate).     Assessment:   This is a routine wellness examination for Aadon.  Hearing/Vision screen Vision Screening - Comments:: Wears rx glasses - up to date with routine eye exams with   Dr.Groat  Goals Addressed             This Visit's Progress    DIET - EAT MORE FRUITS AND VEGETABLES         Depression Screen    12/20/2022    3:46 PM 11/11/2022    8:25 AM 06/10/2022    8:23 AM 05/07/2022    8:24 AM 11/10/2020    1:05 PM 05/17/2020    1:17 PM  PHQ 2/9 Scores  PHQ - 2 Score 0 1 0 0 1 0  PHQ- 9 Score 0 2 3 4 6  0    Fall Risk    12/20/2022    3:42 PM 12/13/2022    8:55 PM 06/10/2022    8:23 AM 05/07/2022    8:24 AM 11/10/2020    1:04 PM  Fall Risk   Falls in the past year? 0 0 0 0 0  Number falls in past yr: 0 0 0 0 0  Injury with Fall? 0 0 0 0 0  Risk for fall due to : No Fall Risks  No Fall Risks No Fall Risks No Fall Risks  Follow up Falls prevention discussed  Falls evaluation completed Falls evaluation completed Falls evaluation completed    MEDICARE RISK AT HOME: Medicare Risk at Home Any stairs in or around the home?: Yes If so, are there any without handrails?: No Home free of loose throw rugs in walkways, pet beds, electrical cords, etc?: Yes Adequate lighting in your home to reduce risk of falls?: Yes Life alert?: No Use of a cane, walker or w/c?: No Grab bars in the bathroom?: No Shower chair or bench in shower?: No Elevated toilet seat or a handicapped toilet?: Yes  TIMED UP AND GO:  Was the test performed?  No    Cognitive Function:      03/19/2022    1:15 PM 10/22/2021   12:55 PM 04/18/2021    1:27 PM 09/14/2020    7:55 AM 10/20/2019    1:50 PM  Montreal Cognitive Assessment   Visuospatial/ Executive (0/5) 4 2 4 4 5   Naming (0/3) 3 3 3 3 3   Attention: Read list of digits (0/2) 2 2 2 2 2   Attention: Read list of letters (0/1) 1 1 1 1 1   Attention: Serial 7 subtraction starting at 100 (0/3) 3 1 3 3 3   Language: Repeat phrase (0/2) 2 2 2 1 2   Language : Fluency (0/1) 1 1 1 1 1   Abstraction (0/2) 2 2 2 2 2   Delayed Recall (0/5) 5 5 5 5 2   Orientation (0/6) 6 6 6 5 6   Total 29 25 29 27 27   Adjusted Score (based on education)  25   27       12/20/2022    3:49 PM  6CIT Screen  What Year? 0 points  What month? 0 points  What time? 0 points  Count back from 20 0 points  Months in reverse 0 points  Repeat phrase 0 points  Total Score 0 points    Immunizations Immunization History  Administered Date(s) Administered   Fluad Quad(high Dose 65+) 12/16/2018, 10/20/2020   Fluad Trivalent(High Dose 65+) 11/11/2022   Moderna Sars-Covid-2 Vaccination 04/21/2019, 05/28/2019   PNEUMOCOCCAL CONJUGATE-20 05/07/2022   Pneumococcal Polysaccharide-23 10/20/2020    TDAP status: Due, Education has been provided regarding the importance of this vaccine. Advised may receive this vaccine at local pharmacy or Health Dept. Aware to provide a copy of the vaccination record if  obtained from local pharmacy or Health Dept. Verbalized acceptance and understanding.  Flu Vaccine status: Up to date  Pneumococcal vaccine status: Up to date  Covid-19 vaccine status: Completed vaccines  Qualifies for Shingles Vaccine? Yes   Zostavax completed No   Shingrix Completed?: No.    Education has been provided regarding the importance of this vaccine. Patient has been advised to call insurance company to determine out of pocket expense if they have not yet received this vaccine. Advised may also receive vaccine at local pharmacy or Health Dept. Verbalized acceptance and understanding.  Screening Tests Health Maintenance  Topic Date Due   DTaP/Tdap/Td (1 - Tdap) Never done   Zoster Vaccines- Shingrix (1 of 2) Never done   Colonoscopy  07/29/2018   COVID-19 Vaccine (3 - Moderna risk series) 06/25/2019   Lung Cancer Screening  09/30/2022   HEMOGLOBIN A1C  05/12/2023   FOOT EXAM  06/10/2023   Diabetic kidney evaluation - Urine ACR  06/26/2023   OPHTHALMOLOGY EXAM  10/16/2023   Diabetic kidney evaluation - eGFR measurement  10/21/2023   Medicare Annual Wellness (AWV)  12/20/2023   Pneumonia Vaccine 54+ Years old  Completed   INFLUENZA VACCINE   Completed   Hepatitis C Screening  Completed   HPV VACCINES  Aged Out    Health Maintenance  Health Maintenance Due  Topic Date Due   DTaP/Tdap/Td (1 - Tdap) Never done   Zoster Vaccines- Shingrix (1 of 2) Never done   Colonoscopy  07/29/2018   COVID-19 Vaccine (3 - Moderna risk series) 06/25/2019   Lung Cancer Screening  09/30/2022    Colorectal cancer screening: Referral to GI placed 12/20/2022. Pt aware the office will call re: appt.  Lung Cancer Screening: (Low Dose CT Chest recommended if Age 34-80 years, 20 pack-year currently smoking OR have quit w/in 15years.) does qualify.   Lung Cancer Screening Referral: referral 12/20/2022  Additional Screening:  Hepatitis C Screening: does not qualify; Completed 05/07/2022  Vision Screening: Recommended annual ophthalmology exams for early detection of glaucoma and other disorders of the eye. Is the patient up to date with their annual eye exam?  Yes  Who is the provider or what is the name of the office in which the patient attends annual eye exams? Dr.Groat  If pt is not established with a provider, would they like to be referred to a provider to establish care? No .   Dental Screening: Recommended annual dental exams for proper oral hygiene   Community Resource Referral / Chronic Care Management: CRR required this visit?  No   CCM required this visit?  No     Plan:     I have personally reviewed and noted the following in the patient's chart:   Medical and social history Use of alcohol, tobacco or illicit drugs  Current medications and supplements including opioid prescriptions. Patient is not currently taking opioid prescriptions. Functional ability and status Nutritional status Physical activity Advanced directives List of other physicians Hospitalizations, surgeries, and ER visits in previous 12 months Vitals Screenings to include cognitive, depression, and falls Referrals and appointments  In addition, I  have reviewed and discussed with patient certain preventive protocols, quality metrics, and best practice recommendations. A written personalized care plan for preventive services as well as general preventive health recommendations were provided to patient.     Lorrene Reid, LPN   16/11/9602   After Visit Summary: (MyChart) Due to this being a telephonic visit, the after visit summary with  patients personalized plan was offered to patient via MyChart   Nurse Notes: Due TDAP Vaccine

## 2022-12-28 NOTE — Progress Notes (Unsigned)
Referring Provider:*** Primary Care Physician:  Billie Lade, MD Primary Gastroenterologist:  Dr. Bonnetta Barry chief complaint on file.   HPI:   Justin Carroll is a 67 y.o. male with medical history of ADHD, depression, mild cognitive impairment, coronary artery disease status post NSTEMI, and chronic systolic CHF with an EF 45-50%, CKD, diabetes, presenting today at the request of Dr. Durwin Nora for colon cancer screening..    Past Medical History:  Diagnosis Date   Allergy Lifetime   Mostly pollen   Anxiety Not sure   Ativan prn. A few times a month   CKD (chronic kidney disease) stage 2, GFR 60-89 ml/min 05/07/2022   Depression    Encounter for general adult medical examination with abnormal findings 05/07/2022   GERD (gastroesophageal reflux disease) 5 years   Myocardial infarction (HCC) 10/17/2020   Stent   Sinus infection    yearly   Sleep apnea Diagnosed 2022   CPAP   Substance abuse (HCC) 40+ years ago   Sober since 11/19/1985    Past Surgical History:  Procedure Laterality Date   ADENOIDECTOMY     CORONARY IMAGING/OCT N/A 10/18/2020   Procedure: INTRAVASCULAR IMAGING/OCT;  Surgeon: Iran Ouch, MD;  Location: [facility];  Service: Cardiovascular;  Laterality: N/A;   CORONARY STENT INTERVENTION N/A 10/18/2020   Procedure: CORONARY STENT INTERVENTION;  Surgeon: Iran Ouch, MD;  Location: [facility];  Service: Cardiovascular;  Laterality: N/A;   LEFT HEART CATH AND CORONARY ANGIOGRAPHY N/A 10/18/2020   Procedure: LEFT HEART CATH AND CORONARY ANGIOGRAPHY;  Surgeon: Iran Ouch, MD;  Location: [facility];  Service: Cardiovascular;  Laterality: N/A;   LEFT HEART CATH AND CORONARY ANGIOGRAPHY N/A 09/30/2022   Procedure: LEFT HEART CATH AND CORONARY ANGIOGRAPHY;  Surgeon: Runell Gess, MD;  Location: [facility];  Service: Cardiovascular;  Laterality: N/A;   NASAL SEPTUM SURGERY     SINOSCOPY     TONSILLECTOMY     age 29     Current Outpatient Medications  Medication Sig Dispense Refill   atomoxetine (STRATTERA) 40 MG capsule TAKE ONE CAPSULE (40MG  TOTAL) BY MOUTH DAILY 90 capsule 1   atorvastatin (LIPITOR) 80 MG tablet TAKE ONE (1) TABLET BY MOUTH EVERY DAY 90 tablet 1   Azelastine HCl 137 MCG/SPRAY SOLN USE 2 SPRAY(S) IN EACH NOSTRIL TWICE DAILY AS NEEDED FOR RHINITIS 30 mL 2   buPROPion (WELLBUTRIN XL) 300 MG 24 hr tablet Take 1 tablet (300 mg total) by mouth daily. 90 tablet 0   carvedilol (COREG) 6.25 MG tablet TAKE ONE TABLET (6.25MG  TOTAL) BY MOUTH TWO TIMES DAILY WITH A MEAL 60 tablet 0   Cholecalciferol (VITAMIN D-3 PO) Take 2,000 Units by mouth daily.     clopidogrel (PLAVIX) 75 MG tablet Take 1 tablet (75 mg total) by mouth daily. 90 tablet 3   dapagliflozin propanediol (FARXIGA) 10 MG TABS tablet Take 1 tablet (10 mg total) by mouth daily. 90 tablet 3   Evolocumab (REPATHA SURECLICK) 140 MG/ML SOAJ Inject 140 mg into the skin every 14 (fourteen) days. 2 mL 12   levocetirizine (XYZAL) 5 MG tablet Take 5 mg by mouth every evening.     LORazepam (ATIVAN) 0.5 MG tablet Take 1 tablet (0.5 mg total) by mouth daily as needed for anxiety. 30 tablet 0   mometasone (NASONEX) 50 MCG/ACT nasal spray Place 1 spray into the nose daily. 17 g 5   nitroGLYCERIN (NITROSTAT) 0.4  MG SL tablet Place 1 tablet (0.4 mg total) under the tongue every 5 (five) minutes as needed for chest pain. 25 tablet 3   pantoprazole (PROTONIX) 40 MG tablet TAKE ONE TABLET (40MG  TOTAL) BY MOUTH DAILY 90 tablet 0   sacubitril-valsartan (ENTRESTO) 24-26 MG Take 1 tablet by mouth 2 (two) times daily. 180 tablet 3   spironolactone (ALDACTONE) 25 MG tablet TAKE ONE TABLET (25 MG TOTAL) BY MOUTH AT BEDTIME. 30 tablet 11   tadalafil (CIALIS) 20 MG tablet Take 1 tablet (20 mg total) by mouth daily as needed. 30 tablet 5   tamsulosin (FLOMAX) 0.4 MG CAPS capsule TAKE ONE (1) TABLET BY MOUTH EVERY DAY AFTER SUPPER 90 capsule 3   testosterone  cypionate (DEPOTESTOSTERONE CYPIONATE) 200 MG/ML injection Inject 0.5 mLs (100 mg total) into the muscle once a week. 10 mL 1   traZODone (DESYREL) 100 MG tablet Take 1 tablet (100 mg total) by mouth at bedtime. 90 tablet 0   triamcinolone cream (KENALOG) 0.1 % Apply 1 Application topically daily as needed (irritation/rash).     No current facility-administered medications for this visit.    Allergies as of 12/30/2022   (No Known Allergies)    Family History  Problem Relation Age of Onset   Stroke Mother    Alcohol abuse Father    Heart disease Father    Allergic rhinitis Brother    Alcohol abuse Brother    Anxiety disorder Brother    Depression Brother    Depression Brother    Drug abuse Brother    Stroke Brother    Heart disease Paternal Uncle    Heart disease Paternal Uncle    Colon cancer Neg Hx    Pancreatic cancer Neg Hx    Rectal cancer Neg Hx    Stomach cancer Neg Hx     Social History   Socioeconomic History   Marital status: Married    Spouse name: Leisure centre manager   Number of children: 7   Years of education: Not on file   Highest education level: Professional school degree (e.g., MD, DDS, DVM, JD)  Occupational History   Occupation: Lawyer    Comment: self employeed  Tobacco Use   Smoking status: Former    Current packs/day: 0.00    Average packs/day: 1.5 packs/day for 20.0 years (30.0 ttl pk-yrs)    Types: Cigarettes    Start date: 76    Quit date: 02/04/2013    Years since quitting: 9.9   Smokeless tobacco: Never  Vaping Use   Vaping status: Never Used  Substance and Sexual Activity   Alcohol use: Not Currently   Drug use: No   Sexual activity: Yes    Birth control/protection: Surgical  Other Topics Concern   Not on file  Social History Narrative   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   Social Determinants of Health   Financial Resource Strain: Low Risk  (12/20/2022)   Overall Financial Resource Strain (CARDIA)    Difficulty of Paying Living  Expenses: Not hard at all  Food Insecurity: No Food Insecurity (12/20/2022)   Hunger Vital Sign    Worried About Running Out of Food in the Last Year: Never true    Ran Out of Food in the Last Year: Never true  Transportation Needs: No Transportation Needs (12/20/2022)   PRAPARE - Administrator, Civil Service (Medical): No    Lack of Transportation (Non-Medical): No  Physical Activity: Insufficiently Active (12/20/2022)   Exercise Vital Sign  Days of Exercise per Week: 3 days    Minutes of Exercise per Session: 30 min  Stress: No Stress Concern Present (12/20/2022)   [employer] of Occupational Health - Occupational Stress Questionnaire    Feeling of Stress : Not at all  Social Connections: Socially Integrated (12/20/2022)   Social Connection and Isolation Panel [NHANES]    Frequency of Communication with Friends and Family: More than three times a week    Frequency of Social Gatherings with Friends and Family: More than three times a week    Attends Religious Services: More than 4 times per year    Active Member of [employer] or Organizations: Yes    Attends Engineer, structural: More than 4 times per year    Marital Status: Married  Catering manager Violence: Not At Risk (12/20/2022)   Humiliation, Afraid, Rape, and Kick questionnaire    Fear of Current or Ex-Partner: No    Emotionally Abused: No    Physically Abused: No    Sexually Abused: No    Review of Systems: Gen: Denies any fever, chills, fatigue, weight loss, lack of appetite.  CV: Denies chest pain, heart palpitations, peripheral edema, syncope.  Resp: Denies shortness of breath at rest or with exertion. Denies wheezing or cough.  GI: Denies dysphagia or odynophagia. Denies jaundice, hematemesis, fecal incontinence. GU : Denies urinary burning, urinary frequency, urinary hesitancy MS: Denies joint pain, muscle weakness, cramps, or limitation of movement.  Derm: Denies rash, itching, dry  skin Psych: Denies depression, anxiety, memory loss, and confusion Heme: Denies bruising, bleeding, and enlarged lymph nodes.  Physical Exam: There were no vitals taken for this visit. General:   Alert and oriented. Pleasant and cooperative. Well-nourished and well-developed.  Head:  Normocephalic and atraumatic. Eyes:  Without icterus, sclera clear and conjunctiva pink.  Ears:  Normal auditory acuity. Lungs:  Clear to auscultation bilaterally. No wheezes, rales, or rhonchi. No distress.  Heart:  S1, S2 present without murmurs appreciated.  Abdomen:  +BS, soft, non-tender and non-distended. No HSM noted. No guarding or rebound. No masses appreciated.  Rectal:  Deferred  Msk:  Symmetrical without gross deformities. Normal posture. Extremities:  Without edema. Neurologic:  Alert and  oriented x4;  grossly normal neurologically. Skin:  Intact without significant lesions or rashes. Psych:  Alert and cooperative. Normal mood and affect.    Assessment:     Plan:  ***   Ermalinda Memos, PA-C [facility] 12/30/2022

## 2022-12-30 ENCOUNTER — Ambulatory Visit: Payer: Medicare Other | Admitting: Gastroenterology

## 2022-12-30 ENCOUNTER — Encounter: Payer: Self-pay | Admitting: Gastroenterology

## 2022-12-30 VITALS — BP 106/74 | HR 80 | Temp 97.9°F | Ht 66.0 in | Wt 174.0 lb

## 2022-12-30 DIAGNOSIS — Z860101 Personal history of adenomatous and serrated colon polyps: Secondary | ICD-10-CM

## 2022-12-30 DIAGNOSIS — Z09 Encounter for follow-up examination after completed treatment for conditions other than malignant neoplasm: Secondary | ICD-10-CM | POA: Diagnosis not present

## 2022-12-30 NOTE — Patient Instructions (Signed)
We will get you scheduled for a colonoscopy with Dr. Jena Gauss at [facility].  You will need to Farxiga x 3 days prior to your procedure.   I will see you back as needed.   It was nice to meet you today!   Ermalinda Memos, PA-C [facility]

## 2022-12-31 ENCOUNTER — Other Ambulatory Visit: Payer: Self-pay | Admitting: Internal Medicine

## 2022-12-31 DIAGNOSIS — F418 Other specified anxiety disorders: Secondary | ICD-10-CM

## 2023-01-06 NOTE — Progress Notes (Signed)
Cardiology Clinic Note   Patient Name: Justin Carroll Date of Encounter: 01/13/2023  Primary Care Provider:  Billie Lade, MD Primary Cardiologist:  [name], MD  Patient Profile    Justin Carroll presents to clinic today for follow-up evaluation of his coronary artery disease and chronic systolic CHF.  Past Medical History    Past Medical History:  Diagnosis Date   Allergy Lifetime   Mostly pollen   Anxiety Not sure   Ativan prn. A few times a month   CKD (chronic kidney disease) stage 2, GFR 60-89 ml/min 05/07/2022   Depression    Encounter for general adult medical examination with abnormal findings 05/07/2022   GERD (gastroesophageal reflux disease) 5 years   Myocardial infarction (HCC) 10/17/2020   Stent   Sinus infection    yearly   Sleep apnea Diagnosed 2022   CPAP   Substance abuse (HCC) 40+ years ago   Sober since 11/19/1985   Past Surgical History:  Procedure Laterality Date   ADENOIDECTOMY     CORONARY IMAGING/OCT N/A 10/18/2020   Procedure: INTRAVASCULAR IMAGING/OCT;  Surgeon: Iran Ouch, MD;  Location: [facility];  Service: Cardiovascular;  Laterality: N/A;   CORONARY STENT INTERVENTION N/A 10/18/2020   Procedure: CORONARY STENT INTERVENTION;  Surgeon: Iran Ouch, MD;  Location: [facility];  Service: Cardiovascular;  Laterality: N/A;   LEFT HEART CATH AND CORONARY ANGIOGRAPHY N/A 10/18/2020   Procedure: LEFT HEART CATH AND CORONARY ANGIOGRAPHY;  Surgeon: Iran Ouch, MD;  Location: [facility];  Service: Cardiovascular;  Laterality: N/A;   LEFT HEART CATH AND CORONARY ANGIOGRAPHY N/A 09/30/2022   Procedure: LEFT HEART CATH AND CORONARY ANGIOGRAPHY;  Surgeon: Runell Gess, MD;  Location: [facility];  Service: Cardiovascular;  Laterality: N/A;   NASAL SEPTUM SURGERY     SINOSCOPY     TONSILLECTOMY     age 67    Allergies  No Known Allergies  History of Present Illness    [name] has a  PMH of ADHD, depression, mild cognitive impairment, coronary artery disease status post NSTEMI, and chronic systolic CHF with an EF of 35-40%, RV normal on 10/17/2020 echo.  He underwent cardiac catheterization 10/18/2020 which showed a proximal RCA lesion 20%, proximal circumflex-mid circumflex lesion 85%, DES x1 with 0% residual stenosis, normal LVEF.  He was seen by the heart and vascular transition of care clinic on 10/24/2020.  He was started on Entresto 24-26.  With plan for repeat echocardiogram in 3 months.  His Farxiga and carvedilol were continued.  Plan for follow-up in 2 weeks with pharmacy in 6 weeks with Dr. Shirlee Latch.  He presented to the clinic 10/30/20 for follow-up evaluation stated he noticed some fatigue.  We reviewed his echocardiogram and angiography.  He was started on Entresto on 10/24/2020.  His wife presented with him and he noted some increased throat clearing.  He reported that he had allergies for several years and contniued to take allergy medication medication OTC.  He had contacted [facility] and was told that he may not be able to start rehab until October.  He had been walking at home 2 times per day and tolerating well.    He continued to follow with Dr. Shirlee Latch and Dr. Allyson Sabal.  His GDMT had been optimized.  His EF by cardiac MRI was noted to be 51%.  He was seen by Dr. Allyson Sabal 09/27/2022.  He reported that he  had been in the emergency department with chest discomfort on 09/30/2022.  His cardiac troponins at that time were negative.  He noted fatigue and chest discomfort.  This was not like his NSTEMI discomfort.  However, he described left-sided chest heaviness and left upper extremity radiation that improved with sublingual nitroglycerin.  He underwent cardiac catheterization on 09/30/2022.  He was noted to have proximal RCA stenosis 20%.  His proximal circumflex-mid circumflex stent was widely patent.  He presented to the clinic 10/22/22 for follow-up evaluation and  stated he had not had any further episodes of chest discomfort.  He continued to note fatigue.  His blood pressure was 90/60.  Pulse was 64.  He reported compliance with his other heart failure medications.  His weight was 175 pounds.  We reviewed his recent left heart cath.  He expressed understanding.  We reviewed his Repatha medication.  He continued to try to follow a low-sodium diet.  He had not been as physically active.  He enjoyed spending time with his grandchildren.  I  reduced his carvedilol to 6.25 mg twice daily and planned follow-up in 3 to 4 months.  He presents to the clinic today for follow-up evaluation and states it took about 4 days for him to notice a difference with the reduced carvedilol medication.  He reports that he is feeling much younger.  At first he felt that it was the heat that was causing him to be more fatigued.  His blood pressure today is 102/68.  He continues to have long days and continues to work.  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  He has a 85 year old daughter which is his youngest of 7.  I will have him continue his physical activity and current diet.  We will continue his current medication regimen and plan follow-up in 6 to 9 months.  Will plan repeat fasting lipids and LFTs in January.  Today he denies chest pain, shortness of breath, lower extremity edema, fatigue, palpitations, melena, hematuria, hemoptysis, diaphoresis, weakness, presyncope, syncope, orthopnea, and PND.    Home Medications    Prior to Admission medications   Medication Sig Start Date End Date Taking? Authorizing Provider  aspirin 81 MG EC tablet Take 1 tablet (81 mg total) by mouth daily. Swallow whole. 10/20/20   Little Ishikawa, MD  atorvastatin (LIPITOR) 80 MG tablet Take 1 tablet (80 mg total) by mouth daily. 10/20/20   Little Ishikawa, MD  Azelastine HCl 137 MCG/SPRAY SOLN USE 2 SPRAY(S) IN EACH NOSTRIL TWICE DAILY AS NEEDED FOR RHINITIS Patient taking  differently: Place 2 sprays into both nostrils 2 (two) times daily as needed (nasal congestion). 09/21/20   Alfonse Spruce, MD  buPROPion (WELLBUTRIN XL) 300 MG 24 hr tablet Take 1 tablet by mouth once daily 08/21/20   Wilson Singer, MD  carvedilol (COREG) 3.125 MG tablet Take 1 tablet (3.125 mg total) by mouth 2 (two) times daily with a meal. 10/20/20   Little Ishikawa, MD  Cholecalciferol (VITAMIN D-3) 25 MCG (1000 UT) CAPS Take 2 capsules by mouth daily.    [provider]  dapagliflozin propanediol (FARXIGA) 10 MG TABS tablet Take 1 tablet (10 mg total) by mouth daily. 10/20/20   Little Ishikawa, MD  levocetirizine (XYZAL) 5 MG tablet Take 5 mg by mouth every evening.    [provider]  LORazepam (ATIVAN) 0.5 MG tablet Take 1 tablet (0.5 mg total) by mouth daily as needed for anxiety. 10/24/20  Clegg, Amy D, NP  mometasone (NASONEX) 50 MCG/ACT nasal spray Place 1 spray into the nose daily. 12/08/19   Alfonse Spruce, MD  nitroGLYCERIN (NITROSTAT) 0.4 MG SL tablet Place 1 tablet (0.4 mg total) under the tongue every 5 (five) minutes x 3 doses as needed for chest pain. 10/20/20   Little Ishikawa, MD  pantoprazole (PROTONIX) 40 MG tablet Take 1 tablet by mouth once daily 08/21/20   Gosrani, Nimish C, MD  sacubitril-valsartan (ENTRESTO) 24-26 MG Take 1 tablet by mouth 2 (two) times daily. 10/24/20   Clegg, Amy D, NP  sertraline (ZOLOFT) 25 MG tablet Take 0.5 tablets (12.5 mg total) by mouth at bedtime. 10/24/20   Clegg, Amy D, NP  tamsulosin (FLOMAX) 0.4 MG CAPS capsule TAKE 1 CAPSULE BY MOUTH ONCE DAILY AFTER SUPPER Patient taking differently: Take 0.4 mg by mouth daily after supper. 08/21/20   Wilson Singer, MD  testosterone cypionate (DEPOTESTOSTERONE CYPIONATE) 200 MG/ML injection INJECT 0.5 MLS INTO THE MUSCLE TWICE A WEEK. 02/28/20   Lilly Cove C, MD  ticagrelor (BRILINTA) 90 MG TABS tablet Take 1 tablet (90 mg total) by mouth 2 (two)  times daily. 10/20/20   Little Ishikawa, MD  traZODone (DESYREL) 100 MG tablet TAKE 1 TABLET BY MOUTH AT BEDTIME 08/21/20   Wilson Singer, MD    Family History    Family History  Problem Relation Age of Onset   Stroke Mother    Alcohol abuse Father    Heart disease Father    Allergic rhinitis Brother    Alcohol abuse Brother    Anxiety disorder Brother    Depression Brother    Depression Brother    Drug abuse Brother    Stroke Brother    Heart disease Paternal Uncle    Heart disease Paternal Uncle    Colon cancer Neg Hx    Pancreatic cancer Neg Hx    Rectal cancer Neg Hx    Stomach cancer Neg Hx    He indicated that his mother is deceased. He indicated that his father is deceased. He indicated that one of his two brothers is deceased. He indicated that the status of his neg hx is unknown.   Social History    Social History   Socioeconomic History   Marital status: Married    Spouse name: Leisure centre manager   Number of children: 7   Years of education: Not on file   Highest education level: Professional school degree (e.g., MD, DDS, DVM, JD)  Occupational History   Occupation: Lawyer    Comment: self employeed  Tobacco Use   Smoking status: Former    Current packs/day: 0.00    Average packs/day: 1.5 packs/day for 20.0 years (30.0 ttl pk-yrs)    Types: Cigarettes    Start date: 42    Quit date: 02/04/2013    Years since quitting: 9.9   Smokeless tobacco: Never  Vaping Use   Vaping status: Never Used  Substance and Sexual Activity   Alcohol use: Not Currently   Drug use: No   Sexual activity: Yes    Birth control/protection: Surgical  Other Topics Concern   Not on file  Social History Narrative   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   Social Determinants of Health   Financial Resource Strain: Low Risk  (12/20/2022)   Overall Financial Resource Strain (CARDIA)    Difficulty of Paying Living Expenses: Not hard at all  Food Insecurity: No Food Insecurity  (12/20/2022)  Hunger Vital Sign    Worried About Running Out of Food in the Last Year: Never true    Ran Out of Food in the Last Year: Never true  Transportation Needs: No Transportation Needs (12/20/2022)   PRAPARE - Administrator, Civil Service (Medical): No    Lack of Transportation (Non-Medical): No  Physical Activity: Insufficiently Active (12/20/2022)   Exercise Vital Sign    Days of Exercise per Week: 3 days    Minutes of Exercise per Session: 30 min  Stress: No Stress Concern Present (12/20/2022)   [employer] of Occupational Health - Occupational Stress Questionnaire    Feeling of Stress : Not at all  Social Connections: Socially Integrated (12/20/2022)   Social Connection and Isolation Panel [NHANES]    Frequency of Communication with Friends and Family: More than three times a week    Frequency of Social Gatherings with Friends and Family: More than three times a week    Attends Religious Services: More than 4 times per year    Active Member of [employer] or Organizations: Yes    Attends Engineer, structural: More than 4 times per year    Marital Status: Married  Catering manager Violence: Not At Risk (12/20/2022)   Humiliation, Afraid, Rape, and Kick questionnaire    Fear of Current or Ex-Partner: No    Emotionally Abused: No    Physically Abused: No    Sexually Abused: No     Review of Systems    General:  No chills, fever, night sweats or weight changes.  Cardiovascular:  No chest pain, dyspnea on exertion, edema, orthopnea, palpitations, paroxysmal nocturnal dyspnea. Dermatological: No rash, lesions/masses Respiratory: No cough, dyspnea Urologic: No hematuria, dysuria Abdominal:   No nausea, vomiting, diarrhea, bright red blood per rectum, melena, or hematemesis Neurologic:  No visual changes, wkns, changes in mental status. All other systems reviewed and are otherwise negative except as noted above.  Physical Exam    VS:  BP 102/68  (BP Location: Left Arm, Patient Position: Sitting, Cuff Size: Normal)   Pulse 79   Ht 5\' 6"  (1.676 m)   Wt 174 lb 3.2 oz (79 kg)   SpO2 96%   BMI 28.12 kg/m  , BMI Body mass index is 28.12 kg/m. GEN: Well nourished, well developed, in no acute distress. HEENT: normal. Neck: Supple, no JVD, carotid bruits, or masses. Cardiac: RRR, no murmurs, rubs, or gallops. No clubbing, cyanosis, edema.  Radials/DP/PT 2+ and equal bilaterally.  Respiratory:  Respirations regular and unlabored, clear to auscultation bilaterally. GI: Soft, nontender, nondistended, BS + x 4. MS: no deformity or atrophy. Skin: warm and dry, no rash.  Neuro:  Strength and sensation are intact. Psych: Normal affect.  Accessory Clinical Findings    Recent Labs: 05/07/2022: TSH 1.090 10/21/2022: ALT 18; BUN 18; Creatinine, Ser 1.12; Hemoglobin 16.3; Platelets 192; Potassium 4.5; Sodium 141   Recent Lipid Panel    Component Value Date/Time   CHOL 113 05/07/2022 0934   TRIG 142 05/07/2022 0934   HDL 63 05/07/2022 0934   CHOLHDL 1.8 05/07/2022 0934   CHOLHDL 2.0 02/18/2022 1446   VLDL 16 02/18/2022 1446   LDLCALC 26 05/07/2022 0934    ECG personally reviewed by me today-none today.  EKG 9/23 normal sinus rhythm 68 bpm no ST or T wave deviation- No acute changes  Echocardiogram 10/18/2020 IMPRESSIONS     1. Left ventricular ejection fraction, by estimation, is 35 to 40%. The  left  ventricle has moderately decreased function. The left ventricle  demonstrates global hypokinesis. There is mild concentric left ventricular  hypertrophy. Left ventricular  diastolic parameters are consistent with Grade I diastolic dysfunction  (impaired relaxation).   2. Right ventricular systolic function is normal. The right ventricular  size is normal.   3. Right atrial size was mildly dilated.   4. The mitral valve is normal in structure. Mild mitral valve  regurgitation. No evidence of mitral stenosis.   5. The aortic valve  is tricuspid. Aortic valve regurgitation is not  visualized. No aortic stenosis is present.   6. Aortic dilatation noted. There is mild dilatation of the aortic root,  measuring 40 mm. There is borderline dilatation of the ascending aorta,  measuring 36 mm.   7. The inferior vena cava is normal in size with greater than 50%  respiratory variability, suggesting right atrial pressure of 3 mmHg.  Cardiac catheterization 02/18/2020   Prox RCA lesion is 20% stenosed.   Prox Cx to Mid Cx lesion is 85% stenosed.   A drug-eluting stent was successfully placed using a STENT ONYX FRONTIER 3.5X15.   Post intervention, there is a 0% residual stenosis.   The left ventricular systolic function is normal.   LV end diastolic pressure is normal.   The left ventricular ejection fraction is 55-65% by visual estimate.   1.  Severe one-vessel coronary artery disease involving the mid left circumflex which is likely culprit for non-STEMI. 2. Successful OCT guided DES placement to mid LCX.    Recommendations:  Dual antiplatelet therapy for at least 12 months. Aggressive treatment of risk factors.  Diagnostic Dominance: Right Intervention    Cardiac catheterization 09/30/2022    Prox RCA lesion is 20% stenosed.   Non-stenotic Prox Cx to Mid Cx lesion was previously treated.   HOMAS FRANSSEN is a 67 y.o. male      [id number] LOCATION:  FACILITY: [facility]  PHYSICIAN: [name], M.D. 1955-10-04     DATE OF PROCEDURE:  09/30/2022   DATE OF DISCHARGE:        CARDIAC CATHETERIZATION        History obtained from chart review. DARTANYON TURI is a 67 y.o. mildly overweight married Caucasian male father of 7 children (2 of whom were adopted) grandfather 2 grandchildren, who I last saw in the hospital back in September 2022.  Since that time he has been seen by Dr. Shirlee Latch in the advanced heart failure clinic for medication optimization.  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  He is currently in the process of trying to pare this down.  I last saw him in the office 04/02/2022.  He is accompanied by his wife [name] today.  His cardiac risk factor include 30 pack years tobacco abuse having quit 9 years ago, treated hypertension and hyperlipidemia.  His father did die of a myocardial infarction at age 31.  He had a non-STEMI September 2022 and had PCI drug-eluting stenting of the AV groove circumflex by Dr. Kirke Corin.  His medications for ischemic cardiomyopathy have been optimized by Dr. Shirlee Latch.  His most recent EF by cardiac MRI was 51%.   Since I saw him in the office 6 months ago he was seen in the ER on 09/10/2022 with chest pain.  Troponins were negative.  He says he felt fatigued recently and the pain will not like his non-STEMI pain still was worrisome with left chest heaviness and some  left upper extremity radiation improved with sublingual nitroglycerin.  He presents now for outpatient radial diagnostic cath to define his anatomy.     IMPRESSION: Mr. Weishaupt mid AV groove circumflex stent is widely patent.  He otherwise has no other obstructive disease and his cath is for all intents and purposes similar to his cath after Dr. Kirke Corin placed the stent 2 years ago.  His LVEDP was low at 4 mmHg.  I believe some of his symptoms may be related to relative hypotension.  His medications may need to be adjusted as an outpatient.  His radial sheath was removed and a TR band was placed on the right wrist to achieve patent hemostasis.  The patient left the lab in stable condition.  He will be gently hydrated, and discharged home later this morning.  Will arrange outpatient follow-up with an APP in 3 to 4 weeks and with me in 6 to 8 weeks.   [name]. MD, [facility] 09/30/2022 9:27 AM  Diagnostic Dominance: Right  Intervention    Assessment & Plan   1.  Essential hypertension-BP today 102/68.  Reduced fatigue with decreased carvedilol.  States he is feeling much  younger. Continue carvedilol to 6.25 twice daily.   Heart healthy low-sodium diet-reviewed Maintain physical activity Maintain blood pressure log  Coronary artery disease-denies recent episodes of chest discomfort and exertional chest pain.  Underwent LHC on 09/30/2022.  Circumflex stent widely patent.  Minimal less than 20% RCA stenosis noted.  Details above.  Cardiac catheterization on 10/18/2020.  Was noted to have 20% proximal RCA stenosis, proximal-mid circumflex stenosis 85% and received DES x1. Continue Plavix, atorvastatin, carvedilol Heart healthy low-sodium diet  HFrEF-cardiac MRI showed an EF of 51%.  NYHA class I. Continue carvedilol, Entresto, spironolactone Heart healthy low-sodium diet Increase physical activity as tolerated  Hyperlipidemia-02/18/2022: VLDL 16 05/07/2022: Cholesterol, Total 113; HDL 63; LDL Chol Calc (NIH) 26; Triglycerides 142 Continue statin therapy High-fiber diet Repeat fasting lipids and LFTs 1/25    Disposition: Follow-up with Dr. Allyson Sabal in 6-9 months  Thomasene Ripple. Marilyn Wing NP-C    01/13/2023, 8:44 AM [facility] [address] Office [phone] Fax [phone]  Notice: This dictation was prepared with Dragon dictation along with smaller phrase technology. Any transcriptional errors that result from this process are unintentional and may not be corrected upon review.  I spent 14 minutes examining this patient, reviewing medications, and using patient centered shared decision making involving her cardiac care.  Prior to her visit I spent greater than 20 minutes reviewing her past medical history,  medications, and prior cardiac tests.

## 2023-01-07 ENCOUNTER — Telehealth: Payer: Self-pay | Admitting: *Deleted

## 2023-01-07 NOTE — Telephone Encounter (Signed)
LMOVM to call back to schedule TCS with Dr. Jena Gauss, ASA 3, stop farxiga 3 days prior

## 2023-01-13 ENCOUNTER — Encounter: Payer: Self-pay | Admitting: General Practice

## 2023-01-13 ENCOUNTER — Ambulatory Visit: Payer: Medicare Other | Attending: General Practice | Admitting: General Practice

## 2023-01-13 VITALS — BP 102/68 | HR 79 | Ht 66.0 in | Wt 174.2 lb

## 2023-01-13 DIAGNOSIS — I5020 Unspecified systolic (congestive) heart failure: Secondary | ICD-10-CM

## 2023-01-13 DIAGNOSIS — I10 Essential (primary) hypertension: Secondary | ICD-10-CM

## 2023-01-13 DIAGNOSIS — I2510 Atherosclerotic heart disease of native coronary artery without angina pectoris: Secondary | ICD-10-CM

## 2023-01-13 DIAGNOSIS — E785 Hyperlipidemia, unspecified: Secondary | ICD-10-CM

## 2023-01-13 NOTE — Patient Instructions (Signed)
Medication Instructions:  The current medical regimen is effective;  continue present plan and medications as directed. Please refer to the Current Medication list given to you today.  *If you need a refill on your cardiac medications before your next appointment, please call your pharmacy*  Lab Work: FASTING LIPID AND LFT IN A MONTH If you have labs (blood work) drawn today and your tests are completely normal, you will receive your results only by:  MyChart Message (if you have MyChart) OR  A paper copy in the mail If you have any lab test that is abnormal or we need to change your treatment, we will call you to review the results.  Other Instructions CONTINUE BLOOD PRESSURE LOG CONTINUE PHYSICAL ACTIVITY  Follow-Up: At [facility], you and your health needs are our priority.  As part of our continuing mission to provide you with exceptional heart care, we have created designated Provider Care Teams.  These Care Teams include your primary Cardiologist (physician) and Advanced Practice Providers (APPs -  Physician Assistants and Nurse Practitioners) who all work together to provide you with the care you need, when you need it. To learn more about what you can do with MyChart, go to [email or web address].    Your next appointment:   6-9 month(s)  Provider:   [name], MD  or Edd Fabian, FNP

## 2023-01-20 ENCOUNTER — Other Ambulatory Visit: Payer: Self-pay | Admitting: Cardiovascular Disease

## 2023-01-27 ENCOUNTER — Telehealth (HOSPITAL_COMMUNITY): Payer: Self-pay

## 2023-01-27 ENCOUNTER — Other Ambulatory Visit (HOSPITAL_COMMUNITY): Payer: Self-pay

## 2023-01-27 NOTE — Telephone Encounter (Signed)
Advanced Heart Failure Patient Advocate Encounter  The patient was renewed for a [employer] grant that will help cover the cost of Carvedilol, Entresto, Farxiga, Spironolactone.  Total amount awarded, $10,000.  Effective: 12/28/2022 - 12/27/2023.  BIN [id number] PCN [id number] Group [id number] ID [id number]  Patient also has a grant that is currently active for Repatha. Approval and processing information for both grants emailed to patient.   [name], CPhT Rx Patient Advocate Phone: [phone]

## 2023-01-30 ENCOUNTER — Encounter: Payer: Self-pay | Admitting: Internal Medicine

## 2023-01-30 ENCOUNTER — Other Ambulatory Visit: Payer: Self-pay | Admitting: Internal Medicine

## 2023-01-30 DIAGNOSIS — E1122 Type 2 diabetes mellitus with diabetic chronic kidney disease: Secondary | ICD-10-CM

## 2023-01-30 DIAGNOSIS — E162 Hypoglycemia, unspecified: Secondary | ICD-10-CM

## 2023-01-30 MED ORDER — BLOOD GLUCOSE MONITORING SUPPL DEVI
1.0000 | Freq: Three times a day (TID) | 0 refills | Status: AC
Start: 2023-01-30 — End: ?

## 2023-01-30 MED ORDER — BLOOD GLUCOSE TEST VI STRP
1.0000 | ORAL_STRIP | Freq: Three times a day (TID) | 0 refills | Status: DC
Start: 2023-01-30 — End: 2023-07-04

## 2023-01-30 MED ORDER — LANCETS MISC. MISC
1.0000 | Freq: Three times a day (TID) | 0 refills | Status: AC
Start: 2023-01-30 — End: 2023-03-01

## 2023-01-30 MED ORDER — LANCET DEVICE MISC
1.0000 | Freq: Three times a day (TID) | 0 refills | Status: AC
Start: 2023-01-30 — End: 2023-03-01

## 2023-02-06 MED ORDER — DEXCOM G7 SENSOR MISC
1.0000 | 3 refills | Status: DC
Start: 2023-02-06 — End: 2023-06-05

## 2023-02-06 MED ORDER — DEXCOM G7 RECEIVER DEVI
1.0000 | 0 refills | Status: DC
Start: 2023-02-06 — End: 2023-06-05

## 2023-02-06 MED ORDER — GVOKE HYPOPEN 2-PACK 1 MG/0.2ML ~~LOC~~ SOAJ
1.0000 mg | SUBCUTANEOUS | 1 refills | Status: DC | PRN
Start: 2023-02-06 — End: 2023-06-05

## 2023-02-10 NOTE — Addendum Note (Signed)
 Addended by: Christel Mormon E on: 02/10/2023 01:41 PM   Modules accepted: Orders

## 2023-02-17 ENCOUNTER — Other Ambulatory Visit: Payer: Self-pay | Admitting: Internal Medicine

## 2023-02-17 DIAGNOSIS — K219 Gastro-esophageal reflux disease without esophagitis: Secondary | ICD-10-CM

## 2023-02-25 ENCOUNTER — Other Ambulatory Visit: Payer: Self-pay | Admitting: Cardiovascular Disease

## 2023-03-08 ENCOUNTER — Other Ambulatory Visit (HOSPITAL_COMMUNITY): Payer: Self-pay | Admitting: Cardiology

## 2023-03-10 ENCOUNTER — Telehealth: Payer: Self-pay | Admitting: Internal Medicine

## 2023-03-10 NOTE — Telephone Encounter (Signed)
 See prior message

## 2023-03-10 NOTE — Telephone Encounter (Signed)
Patient left a message stating that he had a referral back in December to get scheduled for a colonoscopy.  I saw a note where a message was left for him to call back to get scheduled.

## 2023-03-11 ENCOUNTER — Encounter: Payer: Self-pay | Admitting: Cardiovascular Disease

## 2023-03-11 MED ORDER — PEG 3350-KCL-NA BICARB-NACL 420 G PO SOLR: 4000.0000 mL | Freq: Once | ORAL | 0 refills | Status: AC

## 2023-03-11 NOTE — Addendum Note (Signed)
Addended by: Armstead Peaks on: 03/11/2023 10:21 AM   Modules accepted: Orders

## 2023-03-12 DIAGNOSIS — I2510 Atherosclerotic heart disease of native coronary artery without angina pectoris: Secondary | ICD-10-CM | POA: Diagnosis not present

## 2023-03-12 DIAGNOSIS — N182 Chronic kidney disease, stage 2 (mild): Secondary | ICD-10-CM | POA: Diagnosis not present

## 2023-03-12 DIAGNOSIS — E119 Type 2 diabetes mellitus without complications: Secondary | ICD-10-CM | POA: Diagnosis not present

## 2023-03-12 DIAGNOSIS — I959 Hypotension, unspecified: Secondary | ICD-10-CM | POA: Diagnosis not present

## 2023-03-12 DIAGNOSIS — E11649 Type 2 diabetes mellitus with hypoglycemia without coma: Secondary | ICD-10-CM | POA: Diagnosis not present

## 2023-03-12 DIAGNOSIS — I255 Ischemic cardiomyopathy: Secondary | ICD-10-CM | POA: Diagnosis not present

## 2023-03-13 ENCOUNTER — Encounter: Payer: Self-pay | Admitting: *Deleted

## 2023-03-15 ENCOUNTER — Telehealth: Payer: Medicare Other | Admitting: Nurse Practitioner

## 2023-03-15 DIAGNOSIS — J101 Influenza due to other identified influenza virus with other respiratory manifestations: Secondary | ICD-10-CM | POA: Diagnosis not present

## 2023-03-15 MED ORDER — OSELTAMIVIR PHOSPHATE 75 MG PO CAPS: 75.0000 mg | ORAL_CAPSULE | Freq: Two times a day (BID) | ORAL | 0 refills | Status: AC

## 2023-03-15 NOTE — Progress Notes (Signed)
 Virtual Visit Consent   Justin Carroll, you are scheduled for a virtual visit with a [facility] provider today. Just as with appointments in the office, your consent must be obtained to participate. Your consent will be active for this visit and any virtual visit you may have with one of our providers in the next 365 days. If you have a MyChart account, a copy of this consent can be sent to you electronically.  As this is a virtual visit, video technology does not allow for your provider to perform a traditional examination. This may limit your provider's ability to fully assess your condition. If your provider identifies any concerns that need to be evaluated in person or the need to arrange testing (such as labs, EKG, etc.), we will make arrangements to do so. Although advances in technology are sophisticated, we cannot ensure that it will always work on either your end or our end. If the connection with a video visit is poor, the visit may have to be switched to a telephone visit. With either a video or telephone visit, we are not always able to ensure that we have a secure connection.  By engaging in this virtual visit, you consent to the provision of healthcare and authorize for your insurance to be billed (if applicable) for the services provided during this visit. Depending on your insurance coverage, you may receive a charge related to this service.  I need to obtain your verbal consent now. Are you willing to proceed with your visit today? Justin Carroll has provided verbal consent on 03/15/2023 for a virtual visit (video or telephone). Justin LELON Servant, NP  Date: 03/15/2023 6:13 PM  Virtual Visit via Video Note   I, Justin Carroll, connected with  Justin Carroll  ([id number], [birth year]) on 03/15/23 at  6:00 PM EST by a video-enabled telemedicine application and verified that I am speaking with the correct person using two identifiers.  Location: Patient: Virtual Visit Location Patient:  Home Provider: Virtual Visit Location Provider: Home Office   I discussed the limitations of evaluation and management by telemedicine and the availability of in person appointments. The patient expressed understanding and agreed to proceed.    History of Present Illness: Justin Carroll is a 68 y.o. who identifies as a male who was assigned male at birth, and is being seen today for FLU A.  Justin Carroll tested positive for FLU A today via home kit. He notes the following symptoms over the past 24 hours: congestion, cough, increased mucous production, fever tmax 100.7 He did receive his flu vaccine in [place]      Problems:  Patient Active Problem List   Diagnosis Date Noted   Ischemic cardiomyopathy 11/11/2022   Colon cancer screening 11/11/2022   Need for influenza vaccination 11/11/2022   Type 2 diabetes mellitus with diabetic chronic kidney disease (HCC) 06/10/2022   CAD (coronary artery disease) 05/07/2022   Attention deficit 05/07/2022   Anxiety and depression 05/07/2022   Insomnia 05/07/2022   GERD (gastroesophageal reflux disease) 05/07/2022   BPH (benign prostatic hyperplasia) 05/07/2022   OSA on CPAP 05/07/2022   CKD (chronic kidney disease) stage 2, GFR 60-89 ml/min 05/07/2022   History of hypogonadism 05/07/2022   Encounter for general adult medical examination with abnormal findings 05/07/2022   Left ventricular dysfunction 04/02/2022   Depression 03/19/2022   Behavior-irritability 04/18/2021   Hyperlipemia 01/10/2021   NSTEMI (non-ST elevated myocardial infarction) (HCC) 10/17/2020   Seasonal and perennial allergic  rhinitis 12/08/2019   Cognitive attention deficit 04/19/2019   Memory loss, short term 02/18/2019   Fall 02/18/2019   Other frontotemporal dementia (CODE) 02/18/2019   Eye movement abnormality 02/18/2019   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 May substitute to any manufacturer covered by patient's insurance., Disp: 1 each, Rfl: 0   oseltamivir  (TAMIFLU ) 75 MG capsule, Take 1 capsule (75 mg total) by mouth 2 (two) times daily for 5 days., Disp: 10 capsule, Rfl: 0   atomoxetine  (STRATTERA ) 40 MG capsule, TAKE ONE CAPSULE (40MG  TOTAL) BY MOUTH DAILY, Disp: 90 capsule, Rfl: 1   atorvastatin  (LIPITOR ) 80 MG tablet, TAKE ONE (1) TABLET BY MOUTH EVERY DAY, Disp: 90 tablet, Rfl: 2   Azelastine  HCl 137 MCG/SPRAY SOLN, USE 2 SPRAY(S) IN EACH NOSTRIL TWICE DAILY AS NEEDED FOR RHINITIS, Disp: 30 mL, Rfl: 2   buPROPion  (WELLBUTRIN  XL) 300 MG 24 hr tablet, TAKE ONE TABLET (300MG  TOTAL) BY MOUTH DAILY, Disp: 90 tablet, Rfl: 0   carvedilol  (COREG ) 6.25 MG tablet, TAKE ONE TABLET (6.25MG  TOTAL) BY MOUTH TWO TIMES DAILY WITH A MEAL, Disp: 60 tablet, Rfl: 6   Cholecalciferol  (VITAMIN D -3 PO), Take 2,000 Units by mouth daily., Disp: , Rfl:    clopidogrel  (PLAVIX ) 75 MG tablet, Take 1 tablet (75 mg total) by mouth daily., Disp: 90 tablet, Rfl: 3   Continuous Glucose Receiver (DEXCOM G7 RECEIVER) DEVI, 1 Device by Does not apply route continuous., Disp: 1 each, Rfl: 0   Continuous Glucose Sensor (DEXCOM G7 SENSOR) MISC, 1 Device by Does not apply route continuous., Disp: 1 each, Rfl: 3   dapagliflozin  propanediol (FARXIGA ) 10 MG TABS tablet, Take 1 tablet (10 mg total) by mouth daily., Disp: 90 tablet, Rfl: 3   Evolocumab  (REPATHA  SURECLICK) 140 MG/ML SOAJ, Inject 140 mg into the skin every 14 (fourteen) days., Disp: 2 mL, Rfl: 12   Glucagon  (GVOKE HYPOPEN  2-PACK) 1 MG/0.2ML SOAJ, Inject 1 mg into the skin as needed (hypoglycemia)., Disp: 0.2 mL, Rfl: 1   levocetirizine (XYZAL ) 5 MG tablet, Take 5 mg by mouth every evening., Disp: , Rfl:    LORazepam  (ATIVAN ) 0.5 MG tablet, Take 1 tablet (0.5 mg total) by mouth daily as needed for  anxiety., Disp: 30 tablet, Rfl: 0   mometasone  (NASONEX ) 50 MCG/ACT nasal spray, Place 1 spray into the nose daily., Disp: 17 g, Rfl: 5   nitroGLYCERIN  (NITROSTAT ) 0.4 MG SL tablet, Place 1 tablet (0.4 mg total) under the tongue every 5 (five) minutes as needed for chest pain., Disp: 25 tablet, Rfl: 3   pantoprazole  (PROTONIX ) 40 MG tablet, TAKE ONE TABLET (40MG  TOTAL) BY MOUTH DAILY, Disp: 90 tablet, Rfl: 0   sacubitril -valsartan  (ENTRESTO ) 24-26 MG, TAKE ONE TABLET BY MOUTH TWICE A DAY, Disp: 180 tablet, Rfl: 0   spironolactone  (ALDACTONE ) 25 MG tablet, TAKE ONE TABLET (25 MG TOTAL) BY MOUTH AT BEDTIME., Disp: 30 tablet, Rfl: 11   tadalafil  (CIALIS ) 20 MG tablet, Take 1 tablet (20 mg total) by mouth daily as needed., Disp: 30 tablet, Rfl: 5   tamsulosin  (FLOMAX ) 0.4 MG CAPS capsule, TAKE ONE (1) TABLET BY MOUTH EVERY DAY AFTER SUPPER, Disp: 90 capsule, Rfl: 3   testosterone  cypionate (DEPOTESTOSTERONE CYPIONATE) 200 MG/ML injection, Inject 0.5 mLs (100 mg total) into the muscle  once a week., Disp: 10 mL, Rfl: 1   traZODone  (DESYREL ) 100 MG tablet, Take 1 tablet (100 mg total) by mouth at bedtime., Disp: 90 tablet, Rfl: 0   triamcinolone  cream (KENALOG ) 0.1 %, Apply 1 Application topically daily as needed (irritation/rash)., Disp: , Rfl:   Observations/Objective: Patient is well-developed, well-nourished in no acute distress.  Resting comfortably at home.  Head is normocephalic, atraumatic.  No labored breathing.  Speech is clear and coherent with logical content.  Patient is alert and oriented at baseline.    Assessment and Plan: 1. Influenza A (Primary) - oseltamivir  (TAMIFLU ) 75 MG capsule; Take 1 capsule (75 mg total) by mouth 2 (two) times daily for 5 days.  Dispense: 10 capsule; Refill: 0  INSTRUCTIONS: use a humidifier for nasal congestion Drink plenty of fluids, rest and wash hands frequently to avoid the spread of infection Alternate tylenol  and Motrin for relief of fever    Follow Up Instructions: I discussed the assessment and treatment plan with the patient. The patient was provided an opportunity to ask questions and all were answered. The patient agreed with the plan and demonstrated an understanding of the instructions.  A copy of instructions were sent to the patient via MyChart unless otherwise noted below.    The patient was advised to call back or seek an in-person evaluation if the symptoms worsen or if the condition fails to improve as anticipated.    Habib Kise W Daleiza Bacchi, NP

## 2023-03-15 NOTE — Patient Instructions (Signed)
 Justin Carroll, thank you for joining Haze LELON Servant, NP for today's virtual visit.  While this provider is not your primary care provider (PCP), if your PCP is located in our provider database this encounter information will be shared with them immediately following your visit.   A [facility] MyChart account gives you access to today's visit and all your visits, tests, and labs performed at [facility]  click here if you don't have a [facility] MyChart account or go to [email or web address]  Consent: (Patient) Justin Carroll provided verbal consent for this virtual visit at the beginning of the encounter.  Current Medications:  Current Outpatient Medications:    Blood Glucose Monitoring Suppl DEVI, 1 each by Does not apply route in the morning, at noon, and at bedtime. May substitute to any manufacturer covered by patient's insurance., Disp: 1 each, Rfl: 0   oseltamivir  (TAMIFLU ) 75 MG capsule, Take 1 capsule (75 mg total) by mouth 2 (two) times daily for 5 days., Disp: 10 capsule, Rfl: 0   atomoxetine  (STRATTERA ) 40 MG capsule, TAKE ONE CAPSULE (40MG  TOTAL) BY MOUTH DAILY, Disp: 90 capsule, Rfl: 1   atorvastatin  (LIPITOR ) 80 MG tablet, TAKE ONE (1) TABLET BY MOUTH EVERY DAY, Disp: 90 tablet, Rfl: 2   Azelastine  HCl 137 MCG/SPRAY SOLN, USE 2 SPRAY(S) IN EACH NOSTRIL TWICE DAILY AS NEEDED FOR RHINITIS, Disp: 30 mL, Rfl: 2   buPROPion  (WELLBUTRIN  XL) 300 MG 24 hr tablet, TAKE ONE TABLET (300MG  TOTAL) BY MOUTH DAILY, Disp: 90 tablet, Rfl: 0   carvedilol  (COREG ) 6.25 MG tablet, TAKE ONE TABLET (6.25MG  TOTAL) BY MOUTH TWO TIMES DAILY WITH A MEAL, Disp: 60 tablet, Rfl: 6   Cholecalciferol  (VITAMIN D -3 PO), Take 2,000 Units by mouth daily., Disp: , Rfl:    clopidogrel  (PLAVIX ) 75 MG tablet, Take 1 tablet (75 mg total) by mouth daily., Disp: 90 tablet, Rfl: 3   Continuous Glucose Receiver (DEXCOM G7 RECEIVER) DEVI, 1 Device by Does not apply route continuous., Disp: 1 each, Rfl: 0    Continuous Glucose Sensor (DEXCOM G7 SENSOR) MISC, 1 Device by Does not apply route continuous., Disp: 1 each, Rfl: 3   dapagliflozin  propanediol (FARXIGA ) 10 MG TABS tablet, Take 1 tablet (10 mg total) by mouth daily., Disp: 90 tablet, Rfl: 3   Evolocumab  (REPATHA  SURECLICK) 140 MG/ML SOAJ, Inject 140 mg into the skin every 14 (fourteen) days., Disp: 2 mL, Rfl: 12   Glucagon  (GVOKE HYPOPEN  2-PACK) 1 MG/0.2ML SOAJ, Inject 1 mg into the skin as needed (hypoglycemia)., Disp: 0.2 mL, Rfl: 1   levocetirizine (XYZAL ) 5 MG tablet, Take 5 mg by mouth every evening., Disp: , Rfl:    LORazepam  (ATIVAN ) 0.5 MG tablet, Take 1 tablet (0.5 mg total) by mouth daily as needed for anxiety., Disp: 30 tablet, Rfl: 0   mometasone  (NASONEX ) 50 MCG/ACT nasal spray, Place 1 spray into the nose daily., Disp: 17 g, Rfl: 5   nitroGLYCERIN  (NITROSTAT ) 0.4 MG SL tablet, Place 1 tablet (0.4 mg total) under the tongue every 5 (five) minutes as needed for chest pain., Disp: 25 tablet, Rfl: 3   pantoprazole  (PROTONIX ) 40 MG tablet, TAKE ONE TABLET (40MG  TOTAL) BY MOUTH DAILY, Disp: 90 tablet, Rfl: 0   sacubitril -valsartan  (ENTRESTO ) 24-26 MG, TAKE ONE TABLET BY MOUTH TWICE A DAY, Disp: 180 tablet, Rfl: 0   spironolactone  (ALDACTONE ) 25 MG tablet, TAKE ONE TABLET (25 MG TOTAL) BY MOUTH AT BEDTIME., Disp: 30 tablet, Rfl: 11   tadalafil  (CIALIS ) 20  MG tablet, Take 1 tablet (20 mg total) by mouth daily as needed., Disp: 30 tablet, Rfl: 5   tamsulosin  (FLOMAX ) 0.4 MG CAPS capsule, TAKE ONE (1) TABLET BY MOUTH EVERY DAY AFTER SUPPER, Disp: 90 capsule, Rfl: 3   testosterone  cypionate (DEPOTESTOSTERONE CYPIONATE) 200 MG/ML injection, Inject 0.5 mLs (100 mg total) into the muscle once a week., Disp: 10 mL, Rfl: 1   traZODone  (DESYREL ) 100 MG tablet, Take 1 tablet (100 mg total) by mouth at bedtime., Disp: 90 tablet, Rfl: 0   triamcinolone  cream (KENALOG ) 0.1 %, Apply 1 Application topically daily as needed (irritation/rash)., Disp: , Rfl:     Medications ordered in this encounter:  Meds ordered this encounter  Medications   oseltamivir  (TAMIFLU ) 75 MG capsule    Sig: Take 1 capsule (75 mg total) by mouth 2 (two) times daily for 5 days.    Dispense:  10 capsule    Refill:  0    Supervising Provider:   BLAISE ALEENE KIDD [[id number]]     *If you need refills on other medications prior to your next appointment, please contact your pharmacy*  Follow-Up: Call back or seek an in-person evaluation if the symptoms worsen or if the condition fails to improve as anticipated.  [facility] [phone]  Other Instructions INSTRUCTIONS: use a humidifier for nasal congestion Drink plenty of fluids, rest and wash hands frequently to avoid the spread of infection Alternate tylenol  and Motrin for relief of fever    If you have been instructed to have an in-person evaluation today at a local Urgent Care facility, please use the link below. It will take you to a list of all of our available [facility], including address, phone number and hours of operation. Please do not delay care.  [facility]  If you or a family member do not have a primary care provider, use the link below to schedule a visit and establish care. When you choose a [facility] primary care physician or advanced practice provider, you gain a long-term partner in health. Find a Primary Care Provider  Learn more about [facility]'s in-office and virtual care options: [facility] - Get Care Now

## 2023-04-02 ENCOUNTER — Other Ambulatory Visit (HOSPITAL_COMMUNITY): Payer: Self-pay | Admitting: Cardiology

## 2023-04-02 ENCOUNTER — Other Ambulatory Visit: Payer: Self-pay | Admitting: Internal Medicine

## 2023-04-02 DIAGNOSIS — F418 Other specified anxiety disorders: Secondary | ICD-10-CM

## 2023-04-11 ENCOUNTER — Telehealth: Payer: Self-pay | Admitting: *Deleted

## 2023-04-11 ENCOUNTER — Encounter: Payer: Self-pay | Admitting: Cardiovascular Disease

## 2023-04-11 NOTE — Telephone Encounter (Signed)
 Office closes at 12 on Fridays.  Will route back to them to contact us back on Monday.  [facility], we received a clearance request to hold [place].  Are you going to need the pt to hold the Plavix as well in preparation for the Colonoscopy?  You can resend the surgical clearance back over with the correction or call the office at [phone].  Thank you!

## 2023-04-11 NOTE — Telephone Encounter (Signed)
   Pre-operative Risk Assessment    Patient Name: Justin Carroll  DOB: 22-Apr-1955 MRN: [id number]   Date of last office visit: 01/13/23 Edd Fabian, FNP Date of next office visit: NONE   Request for Surgical Clearance    Procedure:   COLONOSCOPY  Date of Surgery:  Clearance 04/23/23                                Surgeon:  DR. Jena Gauss Surgeon's Group or Practice Name:  [facility] AT [address] [address] [address] Phone number:  [phone] Fax number:  [phone]   Type of Clearance Requested:   - Medical ; CLEARANCE REQUEST IS ASKING TO HOLD RECOMMENDATIONS FOR FARXIGA   Type of Anesthesia:  Not Indicated   Additional requests/questions:    Elpidio Anis   04/11/2023, 2:27 PM

## 2023-04-11 NOTE — Telephone Encounter (Signed)
  Request for patient to stop medication prior to procedure or is needing cleareance  04/11/23  Justin Carroll 08/26/1955  What type of surgery is being performed? Colonoscopy   When is surgery scheduled? 04/23/23  What type of clearance is required (medical or pharmacy to hold medication or both? medication  Are there any medications that need to be held prior to surgery and how long? Farxiga x 3 days  Name of physician performing surgery?  Dr.Robert Gavin [facility] [employer]: [phone] Fax: [phone]  Anethesia type (none, local, MAC, general)? MAC

## 2023-04-14 ENCOUNTER — Telehealth: Payer: Self-pay | Admitting: *Deleted

## 2023-04-14 NOTE — Telephone Encounter (Signed)
 I s/w surgery scheduler LPN Tammy R. Pt will need to hold Plavix x 5 days prior.

## 2023-04-14 NOTE — Telephone Encounter (Signed)
 Left message for pt to call back as he will need to be added on 04/17/23 per Tereso Newcomer, PAC at 3 pm. I am holding the appt for the pt. I asked for the pt to call back and confirm appt and to review medications.

## 2023-04-14 NOTE — Telephone Encounter (Signed)
   Name: Justin Carroll  DOB: 1956-02-02  MRN: [id number]  Primary Cardiologist: [name], MD  Preoperative team, please contact this patient and set up a phone call appointment for further preoperative risk assessment. Please obtain consent and complete medication review. Thank you for your help.  I confirm that guidance regarding antiplatelet and oral anticoagulation therapy has been completed and, if necessary, noted below. -Still waiting on clarification if Clopidogrel needs to be held for procedure. If so, pt should take ASA while off of Clopidogrel (per protocol)  I also confirmed the patient resides in the state of [place]. As per [facility] Medical Board telemedicine laws, the patient must reside in the state in which the provider is licensed.   Tereso Newcomer, PA-C 04/14/2023, 12:30 PM [facility]

## 2023-04-14 NOTE — Telephone Encounter (Signed)
 Tele visit scheduled for 3/13. Will remove note from preop pool. Tereso Newcomer, PA-C    04/14/2023 5:38 PM

## 2023-04-14 NOTE — Telephone Encounter (Signed)
 Pt confirmed his medication list.   Med rec and consent are done.     Patient Consent for Virtual Visit        Justin Carroll has provided verbal consent on 04/14/2023 for a virtual visit (video or telephone).   CONSENT FOR VIRTUAL VISIT FOR:  Justin Carroll  By participating in this virtual visit I agree to the following:  I hereby voluntarily request, consent and authorize [facility] and its employed or contracted physicians, physician assistants, nurse practitioners or other licensed health care professionals (the Practitioner), to provide me with telemedicine health care services (the "Services") as deemed necessary by the treating Practitioner. I acknowledge and consent to receive the Services by the Practitioner via telemedicine. I understand that the telemedicine visit will involve communicating with the Practitioner through live audiovisual communication technology and the disclosure of certain medical information by electronic transmission. I acknowledge that I have been given the opportunity to request an in-person assessment or other available alternative prior to the telemedicine visit and am voluntarily participating in the telemedicine visit.  I understand that I have the right to withhold or withdraw my consent to the use of telemedicine in the course of my care at any time, without affecting my right to future care or treatment, and that the Practitioner or I may terminate the telemedicine visit at any time. I understand that I have the right to inspect all information obtained and/or recorded in the course of the telemedicine visit and may receive copies of available information for a reasonable fee.  I understand that some of the potential risks of receiving the Services via telemedicine include:  Delay or interruption in medical evaluation due to technological equipment failure or disruption; Information transmitted may not be sufficient (e.g. poor resolution of  images) to allow for appropriate medical decision making by the Practitioner; and/or  In rare instances, security protocols could fail, causing a breach of personal health information.  Furthermore, I acknowledge that it is my responsibility to provide information about my medical history, conditions and care that is complete and accurate to the best of my ability. I acknowledge that Practitioner's advice, recommendations, and/or decision may be based on factors not within their control, such as incomplete or inaccurate data provided by me or distortions of diagnostic images or specimens that may result from electronic transmissions. I understand that the practice of medicine is not an exact science and that Practitioner makes no warranties or guarantees regarding treatment outcomes. I acknowledge that a copy of this consent can be made available to me via my patient portal [facility] MyChart), or I can request a printed copy by calling the office of [facility].    I understand that my insurance will be billed for this visit.   I have read or had this consent read to me. I understand the contents of this consent, which adequately explains the benefits and risks of the Services being provided via telemedicine.  I have been provided ample opportunity to ask questions regarding this consent and the Services and have had my questions answered to my satisfaction. I give my informed consent for the services to be provided through the use of telemedicine in my medical care

## 2023-04-14 NOTE — Telephone Encounter (Signed)
 Left message for the surgery scheduler to call back and confirm if Plavix is needing to be held.

## 2023-04-14 NOTE — Telephone Encounter (Signed)
 Pt confirmed his medication list.    Med rec and consent are done.

## 2023-04-14 NOTE — Telephone Encounter (Signed)
 Left a message for Dr. Jeanella Flattery office to call back and confirm if needing to hold Plavix for upcoming colonoscopy.

## 2023-04-14 NOTE — Telephone Encounter (Signed)
 Pt called back and confirmed his tele preop appt 04/17/23 @ 3 pm. However, the operator did not send me the call after confirming his appt, so that may confirm his medication list.

## 2023-04-17 ENCOUNTER — Ambulatory Visit: Attending: Cardiology

## 2023-04-17 DIAGNOSIS — Z01810 Encounter for preprocedural cardiovascular examination: Secondary | ICD-10-CM

## 2023-04-17 NOTE — Patient Instructions (Signed)
 Justin Carroll  04/17/2023     @PREFPERIOPPHARMACY @   Your procedure is scheduled on 04/23/2023.   Report to [facility] at  0830 A.M.    Call this number if you have problems the morning of surgery:  [phone]  If you experience any cold or flu symptoms such as cough, fever, chills, shortness of breath, etc. between now and your scheduled surgery, please notify us at the above number.   Remember:         Your last dose of farxiga should be on 04/19/2023.   Follow the diet instructions given to you by the office.   You may drink clear liquids until  0630 am on 3/19/202.  Clear liquids allowed are:                    Water, Juice (No red color; non-citric and without pulp; diabetics please choose diet or no sugar options), Carbonated beverages (diabetics please choose diet or no sugar options), Clear Tea (No creamer, milk, or cream, including half & half and powdered creamer), Black Coffee Only (No creamer, milk or cream, including half & half and powdered creamer), and Clear Sports drink (No red color; diabetics please choose diet or no sugar options)    Take these medicines the morning of surgery with A SIP OF WATER              bupropion, carvedilol, lorazepam (if needed), pantoprazole, entresto.     Do not wear jewelry, make-up or nail polish, including gel polish,  artificial nails, or any other type of covering on natural nails (fingers and  toes).  Do not wear lotions, powders, or perfumes, or deodorant.  Do not shave 48 hours prior to surgery.  Men may shave face and neck.  Do not bring valuables to the hospital.  [facility] is not responsible for any belongings or valuables.  Contacts, dentures or bridgework may not be worn into surgery.  Leave your suitcase in the car.  After surgery it may be brought to your room.  For patients admitted to the hospital, discharge time will be determined by your treatment team.  Patients discharged the day of surgery  will not be allowed to drive home and must have someone with them for 24 hours.    Special instructions:   DO NOT smoke tobacco or vape for 24 hours before your procedure.  Please read over the following fact sheets that you were given. Anesthesia Post-op Instructions and Care and Recovery After Surgery      Colonoscopy, Adult, Care After The following information offers guidance on how to care for yourself after your procedure. Your health care provider may also give you more specific instructions. If you have problems or questions, contact your health care provider. What can I expect after the procedure? After the procedure, it is common to have: A small amount of blood in your stool for 24 hours after the procedure. Some gas. Mild cramping or bloating of your abdomen. Follow these instructions at home: Eating and drinking  Drink enough fluid to keep your urine pale yellow. Follow instructions from your health care provider about eating or drinking restrictions. Resume your normal diet as told by your health care provider. Avoid heavy or fried foods that are hard to digest. Activity Rest as told by your health care provider. Avoid sitting for a long time without moving. Get up to take short walks every 1-2 hours.  This is important to improve blood flow and breathing. Ask for help if you feel weak or unsteady. Return to your normal activities as told by your health care provider. Ask your health care provider what activities are safe for you. Managing cramping and bloating  Try walking around when you have cramps or feel bloated. If directed, apply heat to your abdomen as told by your health care provider. Use the heat source that your health care provider recommends, such as a moist heat pack or a heating pad. Place a towel between your skin and the heat source. Leave the heat on for 20-30 minutes. Remove the heat if your skin turns bright red. This is especially important if you  are unable to feel pain, heat, or cold. You have a greater risk of getting burned. General instructions If you were given a sedative during the procedure, it can affect you for several hours. Do not drive or operate machinery until your health care provider says that it is safe. For the first 24 hours after the procedure: Do not sign important documents. Do not drink alcohol. Do your regular daily activities at a slower pace than normal. Eat soft foods that are easy to digest. Take over-the-counter and prescription medicines only as told by your health care provider. Keep all follow-up visits. This is important. Contact a health care provider if: You have blood in your stool 2-3 days after the procedure. Get help right away if: You have more than a small spotting of blood in your stool. You have large blood clots in your stool. You have swelling of your abdomen. You have nausea or vomiting. You have a fever. You have increasing pain in your abdomen that is not relieved with medicine. These symptoms may be an emergency. Get help right away. Call 911. Do not wait to see if the symptoms will go away. Do not drive yourself to the hospital. Summary After the procedure, it is common to have a small amount of blood in your stool. You may also have mild cramping and bloating of your abdomen. If you were given a sedative during the procedure, it can affect you for several hours. Do not drive or operate machinery until your health care provider says that it is safe. Get help right away if you have a lot of blood in your stool, nausea or vomiting, a fever, or increased pain in your abdomen. This information is not intended to replace advice given to you by your health care provider. Make sure you discuss any questions you have with your health care provider. Document Revised: 03/05/2022 Document Reviewed: 09/13/2020 Elsevier Patient Education  2024 Elsevier Inc.General Anesthesia, Adult, Care  After The following information offers guidance on how to care for yourself after your procedure. Your health care provider may also give you more specific instructions. If you have problems or questions, contact your health care provider. What can I expect after the procedure? After the procedure, it is common for people to: Have pain or discomfort at the IV site. Have nausea or vomiting. Have a sore throat or hoarseness. Have trouble concentrating. Feel cold or chills. Feel weak, sleepy, or tired (fatigue). Have soreness and body aches. These can affect parts of the body that were not involved in surgery. Follow these instructions at home: For the time period you were told by your health care provider:  Rest. Do not participate in activities where you could fall or become injured. Do not drive or use machinery. Do  not drink alcohol. Do not take sleeping pills or medicines that cause drowsiness. Do not make important decisions or sign legal documents. Do not take care of children on your own. General instructions Drink enough fluid to keep your urine pale yellow. If you have sleep apnea, surgery and certain medicines can increase your risk for breathing problems. Follow instructions from your health care provider about wearing your sleep device: Anytime you are sleeping, including during daytime naps. While taking prescription pain medicines, sleeping medicines, or medicines that make you drowsy. Return to your normal activities as told by your health care provider. Ask your health care provider what activities are safe for you. Take over-the-counter and prescription medicines only as told by your health care provider. Do not use any products that contain nicotine or tobacco. These products include cigarettes, chewing tobacco, and vaping devices, such as e-cigarettes. These can delay incision healing after surgery. If you need help quitting, ask your health care provider. Contact a  health care provider if: You have nausea or vomiting that does not get better with medicine. You vomit every time you eat or drink. You have pain that does not get better with medicine. You cannot urinate or have bloody urine. You develop a skin rash. You have a fever. Get help right away if: You have trouble breathing. You have chest pain. You vomit blood. These symptoms may be an emergency. Get help right away. Call 911. Do not wait to see if the symptoms will go away. Do not drive yourself to the hospital. Summary After the procedure, it is common to have a sore throat, hoarseness, nausea, vomiting, or to feel weak, sleepy, or fatigue. For the time period you were told by your health care provider, do not drive or use machinery. Get help right away if you have difficulty breathing, have chest pain, or vomit blood. These symptoms may be an emergency. This information is not intended to replace advice given to you by your health care provider. Make sure you discuss any questions you have with your health care provider. Document Revised: 04/20/2021 Document Reviewed: 04/20/2021 Elsevier Patient Education  2024 [employer].

## 2023-04-17 NOTE — Progress Notes (Signed)
 Virtual Visit via Telephone Note   Because of KESTER Carroll co-morbid illnesses, he is at least at moderate risk for complications without adequate follow up.  This format is felt to be most appropriate for this patient at this time.  Due to technical limitations with video connection (technology), today's appointment will be conducted as an audio only telehealth visit, and [name] verbally agreed to proceed in this manner.   All issues noted in this document were discussed and addressed.  No physical exam could be performed with this format.  Evaluation Performed:  Preoperative cardiovascular risk assessment _____________   Date:  04/17/2023   Patient ID:  Justin Carroll, DOB 1955-03-09, MRN [id number] Patient Location:  Home Provider location:   Office  Primary Care Provider:  Billie Lade, MD Primary Cardiologist:  [name], MD  Chief Complaint / Patient Profile   68 y.o. y/o male with a h/o coronary artery disease LV dysfunction, ischemic cardiomyopathy who is pending colonoscopy and presents today for telephonic preoperative cardiovascular risk assessment.  History of Present Illness    [name] is a 68 y.o. male who presents via audio/video conferencing for a telehealth visit today.  Pt was last seen in cardiology clinic on 01/13/2023 by Edd Fabian, NP-C.  At that time Justin Carroll was doing well .  The patient is now pending procedure as outlined above. Since his last visit, he remains stable from a cardiac standpoint.  Today he denies chest pain, shortness of breath, lower extremity edema, fatigue, palpitations, melena, hematuria, hemoptysis, diaphoresis, weakness, presyncope, syncope, orthopnea, and PND.   Past Medical History    Past Medical History:  Diagnosis Date   Allergy Lifetime   Mostly pollen   Anxiety Not sure   Ativan prn. A few times a month   CKD (chronic kidney disease) stage 2, GFR 60-89 ml/min 05/07/2022   Depression     Encounter for general adult medical examination with abnormal findings 05/07/2022   GERD (gastroesophageal reflux disease) 5 years   Myocardial infarction (HCC) 10/17/2020   Stent   Sinus infection    yearly   Sleep apnea Diagnosed 2022   CPAP   Substance abuse (HCC) 40+ years ago   Sober since 11/19/1985   Past Surgical History:  Procedure Laterality Date   ADENOIDECTOMY     CORONARY IMAGING/OCT N/A 10/18/2020   Procedure: INTRAVASCULAR IMAGING/OCT;  Surgeon: Iran Ouch, MD;  Location: [facility];  Service: Cardiovascular;  Laterality: N/A;   CORONARY STENT INTERVENTION N/A 10/18/2020   Procedure: CORONARY STENT INTERVENTION;  Surgeon: Iran Ouch, MD;  Location: [facility];  Service: Cardiovascular;  Laterality: N/A;   LEFT HEART CATH AND CORONARY ANGIOGRAPHY N/A 10/18/2020   Procedure: LEFT HEART CATH AND CORONARY ANGIOGRAPHY;  Surgeon: Iran Ouch, MD;  Location: [facility];  Service: Cardiovascular;  Laterality: N/A;   LEFT HEART CATH AND CORONARY ANGIOGRAPHY N/A 09/30/2022   Procedure: LEFT HEART CATH AND CORONARY ANGIOGRAPHY;  Surgeon: Runell Gess, MD;  Location: [facility];  Service: Cardiovascular;  Laterality: N/A;   NASAL SEPTUM SURGERY     SINOSCOPY     TONSILLECTOMY     age 27    Allergies  No Known Allergies  Home Medications    Prior to Admission medications   Medication Sig Start Date End Date Taking? Authorizing Provider  Blood Glucose Monitoring Suppl DEVI 1 each by Does not apply route  in the morning, at noon, and at bedtime. May substitute to any manufacturer covered by patient's insurance. 01/30/23   Billie Lade, MD  atomoxetine (STRATTERA) 40 MG capsule TAKE ONE CAPSULE (40MG  TOTAL) BY MOUTH DAILY 12/23/22   Dohmeier, Porfirio Mylar, MD  atorvastatin (LIPITOR) 80 MG tablet TAKE ONE (1) TABLET BY MOUTH EVERY DAY 02/26/23   Runell Gess, MD  Azelastine HCl 137 MCG/SPRAY SOLN USE 2 SPRAY(S) IN EACH NOSTRIL  TWICE DAILY AS NEEDED FOR RHINITIS 09/21/20   Alfonse Spruce, MD  buPROPion (WELLBUTRIN XL) 300 MG 24 hr tablet TAKE ONE TABLET (300MG  TOTAL) BY MOUTH DAILY 04/02/23   Billie Lade, MD  carvedilol (COREG) 6.25 MG tablet TAKE ONE TABLET (6.25MG  TOTAL) BY MOUTH TWO TIMES DAILY WITH A MEAL 01/20/23   Ronney Asters, NP  Cholecalciferol (VITAMIN D-3 PO) Take 2,000 Units by mouth daily.    [provider]  clopidogrel (PLAVIX) 75 MG tablet Take 1 tablet (75 mg total) by mouth daily. NEEDS FOLLOW UP APPOINTMENT FOR MORE REFILLS 04/02/23   Laurey Morale, MD  Continuous Glucose Receiver (DEXCOM G7 RECEIVER) DEVI 1 Device by Does not apply route continuous. 02/06/23   Billie Lade, MD  Continuous Glucose Sensor (DEXCOM G7 SENSOR) MISC 1 Device by Does not apply route continuous. 02/06/23   Billie Lade, MD  dapagliflozin propanediol (FARXIGA) 10 MG TABS tablet Take 1 tablet (10 mg total) by mouth daily. 01/10/22   Laurey Morale, MD  Evolocumab (REPATHA SURECLICK) 140 MG/ML SOAJ Inject 140 mg into the skin every 14 (fourteen) days. 04/05/22   Laurey Morale, MD  Glucagon (GVOKE HYPOPEN 2-PACK) 1 MG/0.2ML SOAJ Inject 1 mg into the skin as needed (hypoglycemia). Patient not taking: Reported on 04/14/2023 02/06/23   Billie Lade, MD  levocetirizine (XYZAL) 5 MG tablet Take 5 mg by mouth every evening.    [provider]  LORazepam (ATIVAN) 0.5 MG tablet Take 1 tablet (0.5 mg total) by mouth daily as needed for anxiety. 10/28/22   Billie Lade, MD  mometasone (NASONEX) 50 MCG/ACT nasal spray Place 1 spray into the nose daily. 12/08/19   Alfonse Spruce, MD  nitroGLYCERIN (NITROSTAT) 0.4 MG SL tablet Place 1 tablet (0.4 mg total) under the tongue every 5 (five) minutes as needed for chest pain. Patient not taking: Reported on 04/14/2023 09/27/22   Runell Gess, MD  pantoprazole (PROTONIX) 40 MG tablet TAKE ONE TABLET (40MG  TOTAL) BY MOUTH DAILY 02/17/23   Billie Lade, MD  sacubitril-valsartan (ENTRESTO) 24-26 MG TAKE ONE TABLET BY MOUTH TWICE A DAY 03/10/23   Laurey Morale, MD  spironolactone (ALDACTONE) 25 MG tablet TAKE ONE TABLET (25 MG TOTAL) BY MOUTH AT BEDTIME. 09/24/22   Clegg, Amy D, NP  tadalafil (CIALIS) 20 MG tablet Take 1 tablet (20 mg total) by mouth daily as needed. 10/28/22   McKenzie, Mardene Celeste, MD  tamsulosin (FLOMAX) 0.4 MG CAPS capsule TAKE ONE (1) TABLET BY MOUTH EVERY DAY AFTER SUPPER 10/28/22   McKenzie, Mardene Celeste, MD  testosterone cypionate (DEPOTESTOSTERONE CYPIONATE) 200 MG/ML injection Inject 0.5 mLs (100 mg total) into the muscle once a week. 10/28/22   McKenzie, Mardene Celeste, MD  traZODone (DESYREL) 100 MG tablet Take 1 tablet (100 mg total) by mouth at bedtime. 05/07/22   Billie Lade, MD  triamcinolone cream (KENALOG) 0.1 % Apply 1 Application topically daily as needed (irritation/rash). Patient not taking: Reported on 04/14/2023  [provider]    Physical Exam    Vital Signs:  LUKIS BUNT does not have vital signs available for review today.  Given telephonic nature of communication, physical exam is limited. AAOx3. NAD. Normal affect.  Speech and respirations are unlabored.  Accessory Clinical Findings    None  Assessment & Plan    1.  Preoperative Cardiovascular Risk Assessment:Procedure:   COLONOSCOPY   Date of Surgery:  Clearance 04/23/23                                  Surgeon:  DR. Jena Gauss Surgeon's Group or Practice Name:  [facility] AT [facility] [address] Phone number:  [phone] Fax number:  [phone]     Primary Cardiologist: [name], MD  Chart reviewed as part of pre-operative protocol coverage. Given past medical history and time since last visit, based on ACC/AHA guidelines, BOEN STERBENZ would be at acceptable risk for the planned procedure without further cardiovascular testing.   Patient was advised that if he develops new symptoms prior to surgery to contact our  office to arrange a follow-up appointment.  He verbalized understanding.  If needed his Plavix may be held for 5 days prior to his procedure.  While he is off Plavix he will need to take 81 mg aspirin.  Please discontinue aspirin and resume Plavix as soon as hemostasis is achieved.  His Marcelline Deist will need to be held for 3 days prior to his procedure.  I will route this recommendation to the requesting party via Epic fax function and remove from pre-op pool.      Time:   Today, I have spent  10 minutes with the patient with telehealth technology discussing medical history, symptoms, and management plan.     Ronney Asters, NP  04/17/2023, 7:52 AM

## 2023-04-18 ENCOUNTER — Encounter (HOSPITAL_COMMUNITY)
Admission: RE | Admit: 2023-04-18 | Discharge: 2023-04-18 | Disposition: A | Discharge status: Home / Self Care | Payer: Medicare Other | Source: Ambulatory Visit | Attending: Internal Medicine | Admitting: Internal Medicine

## 2023-04-18 ENCOUNTER — Other Ambulatory Visit: Payer: Self-pay

## 2023-04-18 ENCOUNTER — Encounter (HOSPITAL_COMMUNITY): Payer: Self-pay

## 2023-04-18 VITALS — BP 93/73 | HR 67 | Temp 97.8°F | Resp 18 | Ht 66.0 in | Wt 174.2 lb

## 2023-04-18 DIAGNOSIS — N182 Chronic kidney disease, stage 2 (mild): Secondary | ICD-10-CM | POA: Insufficient documentation

## 2023-04-18 DIAGNOSIS — Z01812 Encounter for preprocedural laboratory examination: Secondary | ICD-10-CM | POA: Insufficient documentation

## 2023-04-18 DIAGNOSIS — Z01818 Encounter for other preprocedural examination: Secondary | ICD-10-CM | POA: Diagnosis present

## 2023-04-18 LAB — BASIC METABOLIC PANEL
Anion gap: 9 (ref 5–15)
BUN: 15 mg/dL (ref 8–23)
CO2: 26 mmol/L (ref 22–32)
Calcium: 8.5 mg/dL — ABNORMAL LOW (ref 8.9–10.3)
Chloride: 104 mmol/L (ref 98–111)
Creatinine, Ser: 1.13 mg/dL (ref 0.61–1.24)
GFR, Estimated: >60 mL/min (ref >60)
Glucose, Bld: 68 mg/dL — ABNORMAL LOW (ref 70–99)
Potassium: 5 mmol/L (ref 3.5–5.1)
Sodium: 139 mmol/L (ref 135–145)

## 2023-04-21 ENCOUNTER — Other Ambulatory Visit: Payer: Medicare Other

## 2023-04-21 DIAGNOSIS — Z8639 Personal history of other endocrine, nutritional and metabolic disease: Secondary | ICD-10-CM | POA: Diagnosis not present

## 2023-04-23 ENCOUNTER — Encounter (HOSPITAL_COMMUNITY): Payer: Self-pay | Admitting: Internal Medicine

## 2023-04-23 ENCOUNTER — Encounter (HOSPITAL_COMMUNITY)
Admission: RE | Disposition: A | Discharge status: Home / Self Care | Payer: Self-pay | Source: Home / Self Care | Attending: Internal Medicine

## 2023-04-23 ENCOUNTER — Ambulatory Visit (HOSPITAL_COMMUNITY)
Admission: RE | Admit: 2023-04-23 | Discharge: 2023-04-23 | Disposition: A | Discharge status: Home / Self Care | Payer: Medicare Other | Attending: Internal Medicine | Admitting: Internal Medicine

## 2023-04-23 ENCOUNTER — Ambulatory Visit (HOSPITAL_COMMUNITY): Admitting: Anesthesiology

## 2023-04-23 ENCOUNTER — Other Ambulatory Visit: Payer: Self-pay

## 2023-04-23 DIAGNOSIS — K5730 Diverticulosis of large intestine without perforation or abscess without bleeding: Secondary | ICD-10-CM

## 2023-04-23 DIAGNOSIS — Z7984 Long term (current) use of oral hypoglycemic drugs: Secondary | ICD-10-CM | POA: Insufficient documentation

## 2023-04-23 DIAGNOSIS — F419 Anxiety disorder, unspecified: Secondary | ICD-10-CM | POA: Diagnosis not present

## 2023-04-23 DIAGNOSIS — N182 Chronic kidney disease, stage 2 (mild): Secondary | ICD-10-CM | POA: Diagnosis not present

## 2023-04-23 DIAGNOSIS — F32A Depression, unspecified: Secondary | ICD-10-CM | POA: Insufficient documentation

## 2023-04-23 DIAGNOSIS — I252 Old myocardial infarction: Secondary | ICD-10-CM | POA: Insufficient documentation

## 2023-04-23 DIAGNOSIS — Z7902 Long term (current) use of antithrombotics/antiplatelets: Secondary | ICD-10-CM | POA: Diagnosis not present

## 2023-04-23 DIAGNOSIS — I2510 Atherosclerotic heart disease of native coronary artery without angina pectoris: Secondary | ICD-10-CM

## 2023-04-23 DIAGNOSIS — G4730 Sleep apnea, unspecified: Secondary | ICD-10-CM | POA: Diagnosis not present

## 2023-04-23 DIAGNOSIS — Z79899 Other long term (current) drug therapy: Secondary | ICD-10-CM | POA: Diagnosis not present

## 2023-04-23 DIAGNOSIS — Z1211 Encounter for screening for malignant neoplasm of colon: Secondary | ICD-10-CM | POA: Diagnosis not present

## 2023-04-23 DIAGNOSIS — K219 Gastro-esophageal reflux disease without esophagitis: Secondary | ICD-10-CM | POA: Diagnosis not present

## 2023-04-23 DIAGNOSIS — K640 First degree hemorrhoids: Secondary | ICD-10-CM

## 2023-04-23 DIAGNOSIS — Z860100 Personal history of colon polyps, unspecified: Secondary | ICD-10-CM | POA: Diagnosis not present

## 2023-04-23 DIAGNOSIS — E1122 Type 2 diabetes mellitus with diabetic chronic kidney disease: Secondary | ICD-10-CM | POA: Diagnosis not present

## 2023-04-23 DIAGNOSIS — Z7951 Long term (current) use of inhaled steroids: Secondary | ICD-10-CM | POA: Diagnosis not present

## 2023-04-23 DIAGNOSIS — Z87891 Personal history of nicotine dependence: Secondary | ICD-10-CM | POA: Insufficient documentation

## 2023-04-23 HISTORY — PX: COLONOSCOPY WITH PROPOFOL: SHX5780

## 2023-04-23 LAB — GLUCOSE, CAPILLARY
Glucose-Capillary: 80 mg/dL (ref 70–99)
Glucose-Capillary: 117 mg/dL — ABNORMAL HIGH (ref 70–99)
Glucose-Capillary: 69 mg/dL — ABNORMAL LOW (ref 70–99)

## 2023-04-23 SURGERY — COLONOSCOPY WITH PROPOFOL
Anesthesia: General

## 2023-04-23 MED ORDER — PROPOFOL 500 MG/50ML IV EMUL
INTRAVENOUS | Status: DC | PRN
  Administered 2023-04-23: 100 ug/kg/min via INTRAVENOUS

## 2023-04-23 MED ORDER — PHENYLEPHRINE 80 MCG/ML (10ML) SYRINGE FOR IV PUSH (FOR BLOOD PRESSURE SUPPORT)
PREFILLED_SYRINGE | INTRAVENOUS | Status: DC | PRN
Start: 2023-04-23 — End: 2023-04-23
  Administered 2023-04-23: 80 ug via INTRAVENOUS
  Administered 2023-04-23 (×4): 160 ug via INTRAVENOUS

## 2023-04-23 MED ORDER — SODIUM CHLORIDE 0.9% FLUSH: 3.0000 mL | Freq: Two times a day (BID) | INTRAVENOUS | Status: DC

## 2023-04-23 MED ORDER — EPHEDRINE 5 MG/ML INJ
INTRAVENOUS | Status: AC
  Filled 2023-04-23: qty 5

## 2023-04-23 MED ORDER — SODIUM CHLORIDE 0.9% FLUSH: 3.0000 mL | INTRAVENOUS | Status: DC | PRN

## 2023-04-23 MED ORDER — DEXTROSE 50 % IV SOLN
INTRAVENOUS | Status: AC
  Filled 2023-04-23: qty 50

## 2023-04-23 MED ORDER — LACTATED RINGERS IV SOLN
INTRAVENOUS | Status: DC | PRN
Start: 2023-04-23 — End: 2023-04-23

## 2023-04-23 MED ORDER — LIDOCAINE HCL (PF) 2 % IJ SOLN
INTRAMUSCULAR | Status: DC | PRN
  Administered 2023-04-23: 50 mg via INTRADERMAL

## 2023-04-23 MED ORDER — EPHEDRINE SULFATE-NACL 50-0.9 MG/10ML-% IV SOSY
PREFILLED_SYRINGE | INTRAVENOUS | Status: DC | PRN
  Administered 2023-04-23 (×2): 5 mg via INTRAVENOUS

## 2023-04-23 MED ORDER — PHENYLEPHRINE 80 MCG/ML (10ML) SYRINGE FOR IV PUSH (FOR BLOOD PRESSURE SUPPORT)
PREFILLED_SYRINGE | INTRAVENOUS | Status: AC
  Filled 2023-04-23: qty 10

## 2023-04-23 MED ORDER — PROPOFOL 10 MG/ML IV BOLUS
INTRAVENOUS | Status: DC | PRN
  Administered 2023-04-23: 100 mg via INTRAVENOUS

## 2023-04-23 MED ORDER — DEXTROSE 50 % IV SOLN
12.5000 g | INTRAVENOUS | Status: AC
  Administered 2023-04-23: 12.5 g via INTRAVENOUS

## 2023-04-23 NOTE — Anesthesia Preprocedure Evaluation (Addendum)
 Anesthesia Evaluation  Patient identified by MRN, date of birth, ID band Patient awake    Reviewed: Allergy & Precautions, H&P , NPO status , Patient's Chart, lab work & pertinent test results, reviewed documented beta blocker date and time   Airway Mallampati: II  TM Distance: >3 FB Neck ROM: full    Dental  (+) Dental Advisory Given, Poor Dentition   Pulmonary sleep apnea , former smoker   Pulmonary exam normal breath sounds clear to auscultation       Cardiovascular + CAD and + Past MI  Normal cardiovascular exam Rhythm:regular Rate:Normal     Neuro/Psych  PSYCHIATRIC DISORDERS Anxiety Depression   Dementia negative neurological ROS     GI/Hepatic ,GERD  ,,(+)     substance abuse    Endo/Other  diabetes, Type 2    Renal/GU Renal diseaseStage 2 CKD  negative genitourinary   Musculoskeletal   Abdominal   Peds  Hematology negative hematology ROS (+)   Anesthesia Other Findings   Reproductive/Obstetrics negative OB ROS                             Anesthesia Physical Anesthesia Plan  ASA: 3  Anesthesia Plan: General   Post-op Pain Management: Minimal or no pain anticipated   Induction:   PONV Risk Score and Plan: Propofol infusion  Airway Management Planned: Nasal Cannula and Natural Airway  Additional Equipment: None  Intra-op Plan:   Post-operative Plan:   Informed Consent: I have reviewed the patients History and Physical, chart, labs and discussed the procedure including the risks, benefits and alternatives for the proposed anesthesia with the patient or authorized representative who has indicated his/her understanding and acceptance.     Dental Advisory Given  Plan Discussed with: CRNA  Anesthesia Plan Comments:         Anesthesia Quick Evaluation

## 2023-04-23 NOTE — Discharge Instructions (Addendum)
  Colonoscopy Discharge Instructions  Read the instructions outlined below and refer to this sheet in the next few weeks. These discharge instructions provide you with general information on caring for yourself after you leave the hospital. Your doctor may also give you specific instructions. While your treatment has been planned according to the most current medical practices available, unavoidable complications occasionally occur. If you have any problems or questions after discharge, call Dr. Jena Gauss at [phone]. ACTIVITY You may resume your regular activity, but move at a slower pace for the next 24 hours.  Take frequent rest periods for the next 24 hours.  Walking will help get rid of the air and reduce the bloated feeling in your belly (abdomen).  No driving for 24 hours (because of the medicine (anesthesia) used during the test).   Do not sign any important legal documents or operate any machinery for 24 hours (because of the anesthesia used during the test).  NUTRITION Drink plenty of fluids.  You may resume your normal diet as instructed by your doctor.  Begin with a light meal and progress to your normal diet. Heavy or fried foods are harder to digest and may make you feel sick to your stomach (nauseated).  Avoid alcoholic beverages for 24 hours or as instructed.  MEDICATIONS You may resume your normal medications unless your doctor tells you otherwise.  WHAT YOU CAN EXPECT TODAY Some feelings of bloating in the abdomen.  Passage of more gas than usual.  Spotting of blood in your stool or on the toilet paper.  IF YOU HAD POLYPS REMOVED DURING THE COLONOSCOPY: No aspirin products for 7 days or as instructed.  No alcohol for 7 days or as instructed.  Eat a soft diet for the next 24 hours.  FINDING OUT THE RESULTS OF YOUR TEST Not all test results are available during your visit. If your test results are not back during the visit, make an appointment with your caregiver to find out the  results. Do not assume everything is normal if you have not heard from your caregiver or the medical facility. It is important for you to follow up on all of your test results.  SEEK IMMEDIATE MEDICAL ATTENTION IF: You have more than a spotting of blood in your stool.  Your belly is swollen (abdominal distention).  You are nauseated or vomiting.  You have a temperature over 101.  You have abdominal pain or discomfort that is severe or gets worse throughout the day.      no polyps found today  You do have diverticulosis  Informational diverticulosis provided  It is recommended you return in 7 years for repeat colonoscopy  At patient request, called Karn Pickler at [phone] -  reviewed findings and recommendations

## 2023-04-23 NOTE — Anesthesia Postprocedure Evaluation (Signed)
 Anesthesia Post Note  Patient: Justin Carroll  Procedure(s) Performed: COLONOSCOPY WITH PROPOFOL  Patient location during evaluation: PACU Anesthesia Type: General Level of consciousness: awake and alert Pain management: pain level controlled Vital Signs Assessment: post-procedure vital signs reviewed and stable Respiratory status: spontaneous breathing, nonlabored ventilation, respiratory function stable and patient connected to nasal cannula oxygen Cardiovascular status: blood pressure returned to baseline and stable Postop Assessment: no apparent nausea or vomiting Anesthetic complications: no   There were no known notable events for this encounter.   Last Vitals:  Vitals:   04/23/23 1121 04/23/23 1130  BP:  93/62  Pulse:    Resp:    Temp: 36.6 C   SpO2:      Last Pain:  Vitals:   04/23/23 1119  TempSrc: Oral  PainSc: 0-No pain                 Dagan Heinz L Arcangel Minion

## 2023-04-23 NOTE — H&P (Addendum)
 @LOGO @   Primary Care Physician:  Billie Lade, MD Primary Gastroenterologist:  Dr. Jena Gauss  Pre-Procedure History & Physical: HPI:  Justin Carroll is a 68 y.o. male here for Colonoscopy.  Had colonic adenomas removed 10 years ago without follow-up.  Past Medical History:  Diagnosis Date   Allergy Lifetime   Mostly pollen   Anxiety Not sure   Ativan prn. A few times a month   CKD (chronic kidney disease) stage 2, GFR 60-89 ml/min 05/07/2022   Depression    Encounter for general adult medical examination with abnormal findings 05/07/2022   GERD (gastroesophageal reflux disease) 5 years   Myocardial infarction (HCC) 10/17/2020   Stent   Sinus infection    yearly   Sleep apnea Diagnosed 2022   CPAP   Substance abuse (HCC) 40+ years ago   Sober since 11/19/1985    Past Surgical History:  Procedure Laterality Date   ADENOIDECTOMY     CORONARY IMAGING/OCT N/A 10/18/2020   Procedure: INTRAVASCULAR IMAGING/OCT;  Surgeon: Iran Ouch, MD;  [facility];  Service: Cardiovascular;  Laterality: N/A;   CORONARY STENT INTERVENTION N/A 10/18/2020   Procedure: CORONARY STENT INTERVENTION;  Surgeon: Iran Ouch, MD;  [facility]: [facility];  Service: Cardiovascular;  Laterality: N/A;   LEFT HEART CATH AND CORONARY ANGIOGRAPHY N/A 10/18/2020   Procedure: LEFT HEART CATH AND CORONARY ANGIOGRAPHY;  Surgeon: Iran Ouch, MD;  [facility];  Service: Cardiovascular;  Laterality: N/A;   LEFT HEART CATH AND CORONARY ANGIOGRAPHY N/A 09/30/2022   Procedure: LEFT HEART CATH AND CORONARY ANGIOGRAPHY;  Surgeon: Runell Gess, MD;  [facility];  Service: Cardiovascular;  Laterality: N/A;   NASAL SEPTUM SURGERY     SINOSCOPY     TONSILLECTOMY     age 33    Prior to Admission medications   Medication Sig Start Date End Date Taking? Authorizing Provider  atomoxetine (STRATTERA) 40 MG capsule TAKE ONE CAPSULE (40MG  TOTAL) BY  MOUTH DAILY 12/23/22  Yes Dohmeier, Porfirio Mylar, MD  atorvastatin (LIPITOR) 80 MG tablet TAKE ONE (1) TABLET BY MOUTH EVERY DAY 02/26/23  Yes Runell Gess, MD  Azelastine HCl 137 MCG/SPRAY SOLN USE 2 SPRAY(S) IN EACH NOSTRIL TWICE DAILY AS NEEDED FOR RHINITIS 09/21/20  Yes Alfonse Spruce, MD  Blood Glucose Monitoring Suppl DEVI 1 each by Does not apply route in the morning, at noon, and at bedtime. May substitute to any manufacturer covered by patient's insurance. 01/30/23   Billie Lade, MD  buPROPion (WELLBUTRIN XL) 300 MG 24 hr tablet TAKE ONE TABLET (300MG  TOTAL) BY MOUTH DAILY 04/02/23  Yes Billie Lade, MD  carvedilol (COREG) 6.25 MG tablet TAKE ONE TABLET (6.25MG  TOTAL) BY MOUTH TWO TIMES DAILY WITH A MEAL 01/20/23  Yes Ronney Asters, NP  Cholecalciferol (VITAMIN D-3 PO) Take 2,000 Units by mouth daily.   Yes [provider]  clopidogrel (PLAVIX) 75 MG tablet Take 1 tablet (75 mg total) by mouth daily. NEEDS FOLLOW UP APPOINTMENT FOR MORE REFILLS 04/02/23  Yes Laurey Morale, MD  levocetirizine (XYZAL) 5 MG tablet Take 5 mg by mouth every evening.   Yes [provider]  LORazepam (ATIVAN) 0.5 MG tablet Take 1 tablet (0.5 mg total) by mouth daily as needed for anxiety. 10/28/22  Yes Billie Lade, MD  mometasone (NASONEX) 50 MCG/ACT nasal spray Place 1 spray into the nose daily. 12/08/19  Yes Malachi Bonds  Louis, MD  pantoprazole (PROTONIX) 40 MG tablet TAKE ONE TABLET (40MG  TOTAL) BY MOUTH DAILY 02/17/23  Yes Billie Lade, MD  spironolactone (ALDACTONE) 25 MG tablet TAKE ONE TABLET (25 MG TOTAL) BY MOUTH AT BEDTIME. 09/24/22  Yes Clegg, Amy D, NP  tamsulosin (FLOMAX) 0.4 MG CAPS capsule TAKE ONE (1) TABLET BY MOUTH EVERY DAY AFTER SUPPER 10/28/22  Yes McKenzie, Mardene Celeste, MD  traZODone (DESYREL) 100 MG tablet Take 1 tablet (100 mg total) by mouth at bedtime. 05/07/22  Yes Billie Lade, MD  triamcinolone cream (KENALOG) 0.1 % Apply 1 Application topically  daily as needed (irritation/rash).   Yes [provider]  Continuous Glucose Receiver (DEXCOM G7 RECEIVER) DEVI 1 Device by Does not apply route continuous. 02/06/23   Billie Lade, MD  Continuous Glucose Sensor (DEXCOM G7 SENSOR) MISC 1 Device by Does not apply route continuous. 02/06/23   Billie Lade, MD  dapagliflozin propanediol (FARXIGA) 10 MG TABS tablet Take 1 tablet (10 mg total) by mouth daily. 01/10/22   Laurey Morale, MD  Evolocumab (REPATHA SURECLICK) 140 MG/ML SOAJ Inject 140 mg into the skin every 14 (fourteen) days. 04/05/22   Laurey Morale, MD  Glucagon (GVOKE HYPOPEN 2-PACK) 1 MG/0.2ML SOAJ Inject 1 mg into the skin as needed (hypoglycemia). Patient not taking: Reported on 04/14/2023 02/06/23   Billie Lade, MD  nitroGLYCERIN (NITROSTAT) 0.4 MG SL tablet Place 1 tablet (0.4 mg total) under the tongue every 5 (five) minutes as needed for chest pain. Patient not taking: Reported on 04/14/2023 09/27/22   Runell Gess, MD  sacubitril-valsartan (ENTRESTO) 24-26 MG TAKE ONE TABLET BY MOUTH TWICE A DAY 03/10/23   Laurey Morale, MD  tadalafil (CIALIS) 20 MG tablet Take 1 tablet (20 mg total) by mouth daily as needed. 10/28/22   McKenzie, Mardene Celeste, MD  testosterone cypionate (DEPOTESTOSTERONE CYPIONATE) 200 MG/ML injection Inject 0.5 mLs (100 mg total) into the muscle once a week. 10/28/22   Malen Gauze, MD    Allergies as of 03/11/2023   (No Known Allergies)    Family History  Problem Relation Age of Onset   Stroke Mother    Alcohol abuse Father    Heart disease Father    Allergic rhinitis Brother    Alcohol abuse Brother    Anxiety disorder Brother    Depression Brother    Depression Brother    Drug abuse Brother    Stroke Brother    Heart disease Paternal Uncle    Heart disease Paternal Uncle    Colon cancer Neg Hx    Pancreatic cancer Neg Hx    Rectal cancer Neg Hx    Stomach cancer Neg Hx     Social History   Socioeconomic History    Marital status: Married    Spouse name: Leisure centre manager   Number of children: 7   Years of education: Not on file   Highest education level: Professional school degree (e.g., MD, DDS, DVM, JD)  Occupational History   Occupation: Lawyer    Comment: self employeed  Tobacco Use   Smoking status: Former    Current packs/day: 0.00    Average packs/day: 1.5 packs/day for 20.0 years (30.0 ttl pk-yrs)    Types: Cigarettes    Start date: 58    Quit date: 02/04/2013    Years since quitting: 10.2   Smokeless tobacco: Never  Vaping Use   Vaping status: Never Used  Substance and Sexual Activity  Alcohol use: Not Currently   Drug use: No   Sexual activity: Yes    Birth control/protection: Surgical  Other Topics Concern   Not on file  Social History Narrative   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   Social Drivers of Corporate investment banker Strain: Low Risk  (12/20/2022)   Overall Financial Resource Strain (CARDIA)    Difficulty of Paying Living Expenses: Not hard at all  Food Insecurity: No Food Insecurity (12/20/2022)   Hunger Vital Sign    Worried About Running Out of Food in the Last Year: Never true    Ran Out of Food in the Last Year: Never true  Transportation Needs: No Transportation Needs (12/20/2022)   PRAPARE - Administrator, Civil Service (Medical): No    Lack of Transportation (Non-Medical): No  Physical Activity: Insufficiently Active (12/20/2022)   Exercise Vital Sign    Days of Exercise per Week: 3 days    Minutes of Exercise per Session: 30 min  Stress: No Stress Concern Present (12/20/2022)   [employer] of Occupational Health - Occupational Stress Questionnaire    Feeling of Stress : Not at all  Social Connections: Socially Integrated (12/20/2022)   Social Connection and Isolation Panel [NHANES]    Frequency of Communication with Friends and Family: More than three times a week    Frequency of Social Gatherings with Friends and Family: More than  three times a week    Attends Religious Services: More than 4 times per year    Active Member of [employer] or Organizations: Yes    Attends Engineer, structural: More than 4 times per year    Marital Status: Married  Catering manager Violence: Not At Risk (12/20/2022)   Humiliation, Afraid, Rape, and Kick questionnaire    Fear of Current or Ex-Partner: No    Emotionally Abused: No    Physically Abused: No    Sexually Abused: No    Review of Systems: See HPI, otherwise negative ROS  Physical Exam: BP 102/73   Pulse 65   Temp 98 F (36.7 C) (Oral)   Resp 13   Ht 5\' 6"  (1.676 m)   Wt 77.1 kg   SpO2 98%   BMI 27.44 kg/m  General:   Alert,  Well-developed, well-nourished, pleasant and cooperative in NAD Neck:  Supple; no masses or thyromegaly. No significant cervical adenopathy. Lungs:  Clear throughout to auscultation.   No wheezes, crackles, or rhonchi. No acute distress. Heart:  Regular rate and rhythm; no murmurs, clicks, rubs,  or gallops. Abdomen: Non-distended, normal bowel sounds.  Soft and nontender without appreciable mass or hepatosplenomegaly.   Impression/Plan:    68 year old gentleman with a distant history of colonic adenomas removed overdue for follow-up colonoscopy.  Colonoscopy today per plan. The risks, benefits, limitations, alternatives and imponderables have been reviewed with the patient. Questions have been answered. All parties are agreeable.       Notice: This dictation was prepared with Dragon dictation along with smaller phrase technology. Any transcriptional errors that result from this process are unintentional and may not be corrected upon review.

## 2023-04-23 NOTE — Transfer of Care (Signed)
 Immediate Anesthesia Transfer of Care Note  Patient: Justin Carroll  Procedure(s) Performed: COLONOSCOPY WITH PROPOFOL  Patient Location: Short Stay  Anesthesia Type:General  Level of Consciousness: awake  Airway & Oxygen Therapy: Patient Spontanous Breathing  Post-op Assessment: Report given to RN and Post -op Vital signs reviewed and stable  Post vital signs: Reviewed and stable  Last Vitals:  Vitals Value Taken Time  BP 82/65 04/23/23 1119  Temp    Pulse 76 04/23/23 1119  Resp 18 04/23/23 1119  SpO2 95 % 04/23/23 1119    Last Pain:  Vitals:   04/23/23 1119  TempSrc: Oral  PainSc: 0-No pain         Complications: No notable events documented.

## 2023-04-23 NOTE — Op Note (Signed)
 [facility] Patient Name: Justin Carroll Procedure Date: 04/23/2023 10:35 AM MRN: [id number] Date of Birth: 03-06-1955 Attending MD: Gennette Pac , MD, [id number] CSN: [id number] Age: 68 Admit Type: Outpatient Procedure:                Colonoscopy Indications:              High risk colon cancer surveillance: Personal                            history of colonic polyps Providers:                Gennette Pac, MD, Crystal Page, Dyann Ruddle Referring MD:              Medicines:                Propofol per Anesthesia Complications:            No immediate complications. Estimated Blood Loss:     Estimated blood loss: none. Procedure:                Pre-Anesthesia Assessment:                           - Prior to the procedure, a History and Physical                            was performed, and patient medications and                            allergies were reviewed. The patient's tolerance of                            previous anesthesia was also reviewed. The risks                            and benefits of the procedure and the sedation                            options and risks were discussed with the patient.                            All questions were answered, and informed consent                            was obtained. Prior Anticoagulants: The patient has                            taken no anticoagulant or antiplatelet agents. ASA                            Grade Assessment: III - A patient with severe                            systemic disease. After reviewing the risks and  benefits, the patient was deemed in satisfactory                            condition to undergo the procedure.                           After obtaining informed consent, the colonoscope                            was passed under direct vision. Throughout the                            procedure, the patient's blood pressure, pulse, and                             oxygen saturations were monitored continuously. The                            [id number]) scope was introduced through the                            anus and advanced to the the cecum, identified by                            appendiceal orifice and ileocecal valve. The                            colonoscopy was performed without difficulty. The                            patient tolerated the procedure well. The quality                            of the bowel preparation was adequate. The                            ileocecal valve, appendiceal orifice, and rectum                            were photographed. The colonoscopy was performed                            without difficulty. The patient tolerated the                            procedure well. The quality of the bowel                            preparation was adequate. Scope In: 11:02:35 AM Scope Out: 11:14:03 AM Scope Withdrawal Time: 0 hours 6 minutes 34 seconds  Total Procedure Duration: 0 hours 11 minutes 28 seconds  Findings:      The perianal and digital rectal examinations were normal.      Non-bleeding internal hemorrhoids were found during retroflexion. The  hemorrhoids were moderate, medium-sized and Grade I (internal       hemorrhoids that do not prolapse).      Scattered small-mouthed diverticula were found in the sigmoid colon and       descending colon.      The exam was otherwise without abnormality on direct and retroflexion       views. Impression:               - Non-bleeding internal hemorrhoids.                           - Diverticulosis in the sigmoid colon and in the                            descending colon.                           - The examination was otherwise normal on direct                            and retroflexion views.                           - No specimens collected. Moderate Sedation:      Moderate (conscious) sedation was personally administered by an        anesthesia professional. The following parameters were monitored: oxygen       saturation, heart rate, blood pressure, respiratory rate, EKG, adequacy       of pulmonary ventilation, and response to care. Recommendation:           - Patient has a contact number available for                            emergencies. The signs and symptoms of potential                            delayed complications were discussed with the                            patient. Return to normal activities tomorrow.                            Written discharge instructions were provided to the                            patient.                           - Advance diet as tolerated.                           - Continue present medications.                           - Repeat colonoscopy in 7 years for surveillance.                           -  Return to GI office (date not yet determined). Procedure Code(s):        --- Professional ---                           [phone], Colonoscopy, flexible; diagnostic, including                            collection of specimen(s) by brushing or washing,                            when performed (separate procedure) Diagnosis Code(s):        --- Professional ---                           Z86.010, Personal history of colonic polyps                           K64.0, First degree hemorrhoids                           K57.30, Diverticulosis of large intestine without                            perforation or abscess without bleeding CPT copyright 2022 American Medical Association. All rights reserved. The codes documented in this report are preliminary and upon coder review may  be revised to meet current compliance requirements. Justin Carroll. Justin Rigdon, MD Gennette Pac, MD 04/23/2023 1:41:53 PM This report has been signed electronically. Number of Addenda: 0

## 2023-04-24 ENCOUNTER — Encounter (HOSPITAL_COMMUNITY): Payer: Self-pay | Admitting: Internal Medicine

## 2023-04-26 LAB — COMPREHENSIVE METABOLIC PANEL
ALT: 17 IU/L (ref 0–44)
AST: 17 IU/L (ref 0–40)
Albumin: 4.1 g/dL (ref 3.9–4.9)
Alkaline Phosphatase: 84 IU/L (ref 44–121)
BUN/Creatinine Ratio: 11 (ref 10–24)
BUN: 13 mg/dL (ref 8–27)
Bilirubin Total: 0.5 mg/dL (ref 0.0–1.2)
CO2: 24 mmol/L (ref 20–29)
Calcium: 9.1 mg/dL (ref 8.6–10.2)
Chloride: 104 mmol/L (ref 96–106)
Creatinine, Ser: 1.17 mg/dL (ref 0.76–1.27)
Globulin, Total: 2.2 g/dL (ref 1.5–4.5)
Glucose: 105 mg/dL — ABNORMAL HIGH (ref 70–99)
Potassium: 4.2 mmol/L (ref 3.5–5.2)
Sodium: 141 mmol/L (ref 134–144)
Total Protein: 6.3 g/dL (ref 6.0–8.5)
eGFR: 67 mL/min/{1.73_m2} (ref >59)

## 2023-04-26 LAB — CBC
Hematocrit: 50.3 % (ref 37.5–51.0)
Hemoglobin: 16.8 g/dL (ref 13.0–17.7)
MCH: 31.3 pg (ref 26.6–33.0)
MCHC: 33.4 g/dL (ref 31.5–35.7)
MCV: 94 fL (ref 79–97)
Platelets: 165 10*3/uL (ref 150–450)
RBC: 5.36 x10E6/uL (ref 4.14–5.80)
RDW: 12.8 % (ref 11.6–15.4)
WBC: 6 10*3/uL (ref 3.4–10.8)

## 2023-04-26 LAB — TESTOSTERONE,FREE AND TOTAL
Testosterone, Free: 16.2 pg/mL (ref 6.6–18.1)
Testosterone: 907 ng/dL (ref 264–916)

## 2023-04-28 ENCOUNTER — Encounter: Payer: Self-pay | Admitting: Urology

## 2023-04-28 ENCOUNTER — Ambulatory Visit: Payer: Medicare Other | Admitting: Urology

## 2023-04-28 VITALS — BP 98/63 | HR 75

## 2023-04-28 DIAGNOSIS — R351 Nocturia: Secondary | ICD-10-CM

## 2023-04-28 DIAGNOSIS — N401 Enlarged prostate with lower urinary tract symptoms: Secondary | ICD-10-CM | POA: Diagnosis not present

## 2023-04-28 DIAGNOSIS — E291 Testicular hypofunction: Secondary | ICD-10-CM | POA: Diagnosis not present

## 2023-04-28 DIAGNOSIS — R350 Frequency of micturition: Secondary | ICD-10-CM | POA: Diagnosis not present

## 2023-04-28 MED ORDER — TESTOSTERONE CYPIONATE 200 MG/ML IM SOLN: 100.0000 mg | INTRAMUSCULAR | 1 refills | Status: DC

## 2023-04-28 NOTE — Patient Instructions (Signed)

## 2023-04-28 NOTE — Progress Notes (Signed)
 04/28/2023 9:18 AM   Justin Carroll Nov 12, 1955 [id number]  Referring provider: Billie Lade, MD [address] [place],  [place] [address]  Followup hypogonadism   HPI: Justin Carroll is a 68yo here for followup for hypogonadism and BPH with nocturia.  Testosterone 907, hemoglobin 16.8. CMP normal. He injects 100mg  every week. He had issues with hypoglycemia since last visit. IPSS 2 QOL 1 on flomax 0.4mg  daily. Nocturia 0-1x. [place]ir[place] stream strong. No straining to urinate. No other complaints today   PMH: Past Medical History:  Diagnosis Date   Allergy Lifetime   Mostly pollen   Anxiety Not sure   Ativan prn. A few times a month   CKD (chronic kidney disease) stage 2, GFR 60-89 ml/min 05/07/2022   Depression    Encounter for general adult medical examination with abnormal findings 05/07/2022   GERD (gastroesophageal reflux disease) 5 years   Myocardial infarction (HCC) 10/17/2020   Stent   Sinus infection    yearly   Sleep apnea Diagnosed 2022   CPAP   Substance abuse (HCC) 40+ years ago   Sober since 11/19/1985    Surgical History: Past Surgical History:  Procedure Laterality Date   ADENOIDECTOMY     COLONOSCOPY WITH PROPOFOL N/A 04/23/2023   Procedure: COLONOSCOPY WITH PROPOFOL;  Surgeon: Corbin Ade, MD;  [facility];  Service: Endoscopy;  Laterality: N/A;  1030AM, ASA 3   CORONARY IMAGING/OCT N/A 10/18/2020   Procedure: INTRAVASCULAR IMAGING/OCT;  Surgeon: Iran Ouch, MD;  [facility];  Service: Cardiovascular;  Laterality: N/A;   CORONARY STENT INTERVENTION N/A 10/18/2020   Procedure: CORONARY STENT INTERVENTION;  Surgeon: Iran Ouch, MD;  [facility];  Service: Cardiovascular;  Laterality: N/A;   LEFT HEART CATH AND CORONARY ANGIOGRAPHY N/A 10/18/2020   Procedure: LEFT HEART CATH AND CORONARY ANGIOGRAPHY;  Surgeon: Iran Ouch, MD;  [facility];  Service: Cardiovascular;   Laterality: N/A;   LEFT HEART CATH AND CORONARY ANGIOGRAPHY N/A 09/30/2022   Procedure: LEFT HEART CATH AND CORONARY ANGIOGRAPHY;  Surgeon: Runell Gess, MD;  [facility]: [facility];  Service: Cardiovascular;  Laterality: N/A;   NASAL SEPTUM SURGERY     SINOSCOPY     TONSILLECTOMY     age 63    Home Medications:  Allergies as of 04/28/2023   No Known Allergies      Medication List        Accurate as of April 28, 2023  9:18 AM. If you have any questions, ask your nurse or doctor.          atomoxetine 40 MG capsule Commonly known as: STRATTERA TAKE ONE CAPSULE (40MG  TOTAL) BY MOUTH DAILY   atorvastatin 80 MG tablet Commonly known as: LIPITOR TAKE ONE (1) TABLET BY MOUTH EVERY DAY   Azelastine HCl 137 MCG/SPRAY Soln USE 2 SPRAY(S) IN EACH NOSTRIL TWICE DAILY AS NEEDED FOR RHINITIS   Blood Glucose Monitoring Suppl Devi 1 each by Does not apply route in the morning, at noon, and at bedtime. May substitute to any manufacturer covered by patient's insurance.   buPROPion 300 MG 24 hr tablet Commonly known as: WELLBUTRIN XL TAKE ONE TABLET (300MG  TOTAL) BY MOUTH DAILY   carvedilol 6.25 MG tablet Commonly known as: COREG TAKE ONE TABLET (6.25MG  TOTAL) BY MOUTH TWO TIMES DAILY WITH A MEAL   clopidogrel 75 MG tablet Commonly known as: PLAVIX Take 1 tablet (  75 mg total) by mouth daily. NEEDS FOLLOW UP APPOINTMENT FOR MORE REFILLS   dapagliflozin propanediol 10 MG Tabs tablet Commonly known as: FARXIGA Take 1 tablet (10 mg total) by mouth daily.   Dexcom G7 Receiver Devi 1 Device by Does not apply route continuous.   Dexcom G7 Sensor Misc 1 Device by Does not apply route continuous.   Entresto 24-26 MG Generic drug: sacubitril-valsartan TAKE ONE TABLET BY MOUTH TWICE A DAY   Gvoke HypoPen 2-Pack 1 MG/0.2ML Soaj Generic drug: Glucagon Inject 1 mg into the skin as needed (hypoglycemia).   levocetirizine 5 MG tablet Commonly known as: XYZAL Take 5 mg by  mouth every evening.   LORazepam 0.5 MG tablet Commonly known as: ATIVAN Take 1 tablet (0.5 mg total) by mouth daily as needed for anxiety.   mometasone 50 MCG/ACT nasal spray Commonly known as: NASONEX Place 1 spray into the nose daily.   nitroGLYCERIN 0.4 MG SL tablet Commonly known as: NITROSTAT Place 1 tablet (0.4 mg total) under the tongue every 5 (five) minutes as needed for chest pain.   pantoprazole 40 MG tablet Commonly known as: PROTONIX TAKE ONE TABLET (40MG  TOTAL) BY MOUTH DAILY   Repatha SureClick 140 MG/ML Soaj Generic drug: Evolocumab Inject 140 mg into the skin every 14 (fourteen) days.   spironolactone 25 MG tablet Commonly known as: ALDACTONE TAKE ONE TABLET (25 MG TOTAL) BY MOUTH AT BEDTIME.   tadalafil 20 MG tablet Commonly known as: CIALIS Take 1 tablet (20 mg total) by mouth daily as needed.   tamsulosin 0.4 MG Caps capsule Commonly known as: FLOMAX TAKE ONE (1) TABLET BY MOUTH EVERY DAY AFTER SUPPER   testosterone cypionate 200 MG/ML injection Commonly known as: DEPOTESTOSTERONE CYPIONATE Inject 0.5 mLs (100 mg total) into the muscle once a week.   traZODone 100 MG tablet Commonly known as: DESYREL Take 1 tablet (100 mg total) by mouth at bedtime.   triamcinolone cream 0.1 % Commonly known as: KENALOG Apply 1 Application topically daily as needed (irritation/rash).   VITAMIN D-3 PO Take 2,000 Units by mouth daily.        Allergies: No Known Allergies  Family History: Family History  Problem Relation Age of Onset   Stroke Mother    Alcohol abuse Father    Heart disease Father    Allergic rhinitis Brother    Alcohol abuse Brother    Anxiety disorder Brother    Depression Brother    Depression Brother    Drug abuse Brother    Stroke Brother    Heart disease Paternal Uncle    Heart disease Paternal Uncle    Colon cancer Neg Hx    Pancreatic cancer Neg Hx    Rectal cancer Neg Hx    Stomach cancer Neg Hx     Social History:   reports that he quit smoking about 10 years ago. His smoking use included cigarettes. He started smoking about 30 years ago. He has a 30 pack-year smoking history. He has never used smokeless tobacco. He reports that he does not currently use alcohol. He reports that he does not use drugs.  ROS: All other review of systems were reviewed and are negative except what is noted above in HPI  Physical Exam: BP 98/63   Pulse 75   Constitutional:  Alert and oriented, No acute distress. HEENT: [place] AT, moist mucus membranes.  Trachea midline, no masses. Cardiovascular: No clubbing, cyanosis, or edema. Respiratory: Normal respiratory effort, no increased work of  breathing. GI: Abdomen is soft, nontender, nondistended, no abdominal masses GU: No CVA tenderness.  Lymph: No cervical or inguinal lymphadenopathy. Skin: No rashes, bruises or suspicious lesions. Neurologic: Grossly intact, no focal deficits, moving all 4 extremities. Psychiatric: Normal mood and affect.  Laboratory Data: Lab Results  Component Value Date   WBC 6.0 04/21/2023   HGB 16.8 04/21/2023   HCT 50.3 04/21/2023   MCV 94 04/21/2023   PLT 165 04/21/2023    Lab Results  Component Value Date   CREATININE 1.17 04/21/2023    No results found for: "PSA"  Lab Results  Component Value Date   TESTOSTERONE 907 04/21/2023    Lab Results  Component Value Date   HGBA1C 5.9 (H) 11/11/2022    Urinalysis    Component Value Date/Time   COLORURINE DARK YELLOW 05/17/2020 0000   APPEARANCEUR Clear 07/31/2022 0847   LABSPEC 1.025 05/17/2020 0000   PHURINE < OR = 5.0 05/17/2020 0000   GLUCOSEU 3+ (A) 07/31/2022 0847   HGBUR NEGATIVE 05/17/2020 0000   BILIRUBINUR Negative 07/31/2022 0847   KETONESUR 1+ (A) 05/17/2020 0000   PROTEINUR Negative 07/31/2022 0847   PROTEINUR NEGATIVE 05/17/2020 0000   NITRITE Negative 07/31/2022 0847   LEUKOCYTESUR Negative 07/31/2022 0847    Lab Results  Component Value Date   LABMICR  Comment 07/31/2022   BACTERIA NONE SEEN 05/17/2020    Pertinent Imaging:  No results found for this or any previous visit.  No results found for this or any previous visit.  No results found for this or any previous visit.  No results found for this or any previous visit.  No results found for this or any previous visit.  No results found for this or any previous visit.  No results found for this or any previous visit.  No results found for this or any previous visit.   Assessment & Plan:    1. Hypogonadism male (Primary) Continue testosterone 100mg  weekly  2. Benign prostatic hyperplasia with urinary frequency Continue flomax 0.4mg  daily  3. Nocturia Continue floma x0.4mg  daily   No follow-ups on file.  Wilkie Aye, MD  [facility] [place]

## 2023-05-07 ENCOUNTER — Other Ambulatory Visit (HOSPITAL_COMMUNITY): Payer: Self-pay

## 2023-05-07 ENCOUNTER — Telehealth: Payer: Self-pay

## 2023-05-07 MED ORDER — REPATHA SURECLICK 140 MG/ML ~~LOC~~ SOAJ: 140.0000 mg | SUBCUTANEOUS | 12 refills | Status: AC

## 2023-05-07 NOTE — Addendum Note (Signed)
 Addended by: Junella Domke, Milagros Reap on: 05/07/2023 04:41 PM   Modules accepted: Orders

## 2023-05-07 NOTE — Telephone Encounter (Signed)
 Patient Advocate Encounter   The patient was approved for a [employer] grant that will help cover the cost of REPATHA Total amount awarded, $2,500.  Effective: 05/06/23 - 05/04/24   [id number] [id number]  Haze Rushing, CPhT  Pharmacy Patient Advocate Specialist  Direct Number: [phone] Fax: [phone]

## 2023-05-08 ENCOUNTER — Other Ambulatory Visit: Payer: Self-pay | Admitting: Internal Medicine

## 2023-05-08 ENCOUNTER — Other Ambulatory Visit: Payer: Self-pay | Admitting: Urology

## 2023-05-08 DIAGNOSIS — G4700 Insomnia, unspecified: Secondary | ICD-10-CM

## 2023-05-12 ENCOUNTER — Encounter: Payer: Self-pay | Admitting: Internal Medicine

## 2023-05-12 ENCOUNTER — Ambulatory Visit: Payer: Medicare Other | Admitting: Internal Medicine

## 2023-05-12 VITALS — BP 110/69 | HR 79 | Ht 65.0 in | Wt 181.6 lb

## 2023-05-12 DIAGNOSIS — F418 Other specified anxiety disorders: Secondary | ICD-10-CM | POA: Diagnosis not present

## 2023-05-12 DIAGNOSIS — F32A Depression, unspecified: Secondary | ICD-10-CM

## 2023-05-12 DIAGNOSIS — Z7984 Long term (current) use of oral hypoglycemic drugs: Secondary | ICD-10-CM

## 2023-05-12 DIAGNOSIS — N182 Chronic kidney disease, stage 2 (mild): Secondary | ICD-10-CM

## 2023-05-12 DIAGNOSIS — F419 Anxiety disorder, unspecified: Secondary | ICD-10-CM

## 2023-05-12 DIAGNOSIS — I2510 Atherosclerotic heart disease of native coronary artery without angina pectoris: Secondary | ICD-10-CM

## 2023-05-12 DIAGNOSIS — E782 Mixed hyperlipidemia: Secondary | ICD-10-CM

## 2023-05-12 DIAGNOSIS — E1122 Type 2 diabetes mellitus with diabetic chronic kidney disease: Secondary | ICD-10-CM | POA: Diagnosis not present

## 2023-05-12 DIAGNOSIS — B356 Tinea cruris: Secondary | ICD-10-CM | POA: Diagnosis not present

## 2023-05-12 MED ORDER — LORAZEPAM 0.5 MG PO TABS: 0.5000 mg | ORAL_TABLET | Freq: Every day | ORAL | 0 refills | Status: DC | PRN

## 2023-05-12 MED ORDER — KETOCONAZOLE 2 % EX CREA
1.0000 | TOPICAL_CREAM | Freq: Every day | CUTANEOUS | 0 refills | Status: DC
Start: 2023-05-12 — End: 2023-10-05

## 2023-05-12 MED ORDER — BUSPIRONE HCL 5 MG PO TABS: 5.0000 mg | ORAL_TABLET | Freq: Two times a day (BID) | ORAL | 2 refills | Status: DC

## 2023-05-12 NOTE — Assessment & Plan Note (Signed)
 He is currently prescribed atorvastatin 80 mg daily and Repatha.  Repeat lipid panel ordered today.

## 2023-05-12 NOTE — Assessment & Plan Note (Signed)
 His acute concern today is poorly controlled symptoms of anxiety and depression.  He is currently prescribed Wellbutrin XL 300 mg daily and lorazepam 0.5 mg daily as needed.  He has felt more stressed and overwhelmed lately and is concerned that Wellbutrin is not as effective as it has been previously.  - Treatment options reviewed.  Through shared decision making, BuSpar 5 mg twice daily has been started.  Will adjust accordingly.  I have also placed a referral for counseling services.

## 2023-05-12 NOTE — Progress Notes (Signed)
 Established Patient Office Visit  Subjective   Patient ID: Justin Carroll, male    DOB: [birth year]  Age: 68 y.o. MRN: [id number]  Chief Complaint  Patient presents with   Care Management    Six month follow up    Rash    Rash on inner thighs    Mr. Casanova returns to care today for routine follow-up.  He was last evaluated by me in October 2024.  No medication changes were made at that time, repeat labs ordered, and 77-month follow-up arranged.  In the interim, he has been seen by neuropsychology, gastroenterology, urology, and cardiology for follow-up.  He underwent repeat colonoscopy on 3/19 with no acute findings identified.  Repeat colonoscopy recommended for 7 years for surveillance.  There have otherwise been no acute interval events. Mr. Hitz endorses poorly controlled symptoms of anxiety and depression.  Denies SI/HI but states that he feels overwhelmed at times and stressed out at work.  He has been taking Wellbutrin since the 1990s and feels that additional medication may be needed.  He also takes lorazepam as needed for anxiety relief and states that he has needed to take it more frequently as of late.  His additional concern today is an itching rash in his groin.  He has been applying triamcinolone cream without significant symptom relief.  Past Medical History:  Diagnosis Date   Allergy Lifetime   Mostly pollen   Anxiety Not sure   Ativan prn. A few times a month   CKD (chronic kidney disease) stage 2, GFR 60-89 ml/min 05/07/2022   Depression    Encounter for general adult medical examination with abnormal findings 05/07/2022   GERD (gastroesophageal reflux disease) 5 years   Myocardial infarction (HCC) 10/17/2020   Stent   Sinus infection    yearly   Sleep apnea Diagnosed 2022   CPAP   Substance abuse (HCC) 40+ years ago   Sober since 11/19/1985   Past Surgical History:  Procedure Laterality Date   ADENOIDECTOMY     COLONOSCOPY WITH PROPOFOL N/A 04/23/2023    Procedure: COLONOSCOPY WITH PROPOFOL;  Surgeon: Corbin Ade, MD;  Location: [facility];  Service: Endoscopy;  Laterality: N/A;  1030AM, ASA 3   CORONARY IMAGING/OCT N/A 10/18/2020   Procedure: INTRAVASCULAR IMAGING/OCT;  Surgeon: Iran Ouch, MD;  Location: [facility];  Service: Cardiovascular;  Laterality: N/A;   CORONARY STENT INTERVENTION N/A 10/18/2020   Procedure: CORONARY STENT INTERVENTION;  Surgeon: Iran Ouch, MD;  Location: [facility];  Service: Cardiovascular;  Laterality: N/A;   LEFT HEART CATH AND CORONARY ANGIOGRAPHY N/A 10/18/2020   Procedure: LEFT HEART CATH AND CORONARY ANGIOGRAPHY;  Surgeon: Iran Ouch, MD;  Location: [facility];  Service: Cardiovascular;  Laterality: N/A;   LEFT HEART CATH AND CORONARY ANGIOGRAPHY N/A 09/30/2022   Procedure: LEFT HEART CATH AND CORONARY ANGIOGRAPHY;  Surgeon: Runell Gess, MD;  Location: [facility];  Service: Cardiovascular;  Laterality: N/A;   NASAL SEPTUM SURGERY     SINOSCOPY     TONSILLECTOMY     age 27   Social History   Tobacco Use   Smoking status: Former    Current packs/day: 0.00    Average packs/day: 1.5 packs/day for 20.0 years (30.0 ttl pk-yrs)    Types: Cigarettes    Start date: 24    Quit date: 02/04/2013    Years since quitting: 10.2   Smokeless tobacco: Never  Vaping  Use   Vaping status: Never Used  Substance Use Topics   Alcohol use: Not Currently   Drug use: No   Family History  Problem Relation Age of Onset   Stroke Mother    Alcohol abuse Father    Heart disease Father    Allergic rhinitis Brother    Alcohol abuse Brother    Anxiety disorder Brother    Depression Brother    Depression Brother    Drug abuse Brother    Stroke Brother    Heart disease Paternal Uncle    Heart disease Paternal Uncle    Colon cancer Neg Hx    Pancreatic cancer Neg Hx    Rectal cancer Neg Hx    Stomach cancer Neg Hx    No Known Allergies  Review of Systems   Constitutional:  Negative for chills and fever.  HENT:  Negative for sore throat.   Respiratory:  Negative for cough and shortness of breath.   Cardiovascular:  Negative for chest pain, palpitations and leg swelling.  Gastrointestinal:  Negative for abdominal pain, blood in stool, constipation, diarrhea, nausea and vomiting.  Genitourinary:  Negative for dysuria and hematuria.  Musculoskeletal:  Negative for myalgias.  Skin:  Positive for itching and rash (Bilateral groin).  Neurological:  Negative for dizziness and headaches.  Psychiatric/Behavioral:  Positive for depression. Negative for suicidal ideas. The patient is nervous/anxious.      Objective:     BP 110/69 (BP Location: Left Arm, Patient Position: Sitting, Cuff Size: Normal)   Pulse 79   Ht 5\' 5"  (1.651 m)   Wt 181 lb 9.6 oz (82.4 kg)   SpO2 92%   BMI 30.22 kg/m  BP Readings from Last 3 Encounters:  05/12/23 110/69  04/28/23 98/63  04/23/23 93/62   Physical Exam Vitals reviewed.  Constitutional:      General: He is not in acute distress.    Appearance: Normal appearance. He is not ill-appearing.  HENT:     Head: Normocephalic and atraumatic.     Right Ear: External ear normal.     Left Ear: External ear normal.     Nose: Nose normal. No congestion or rhinorrhea.     Mouth/Throat:     Mouth: Mucous membranes are moist.     Pharynx: Oropharynx is clear.  Eyes:     General: No scleral icterus.    Extraocular Movements: Extraocular movements intact.     Conjunctiva/sclera: Conjunctivae normal.     Pupils: Pupils are equal, round, and reactive to light.  Cardiovascular:     Rate and Rhythm: Normal rate and regular rhythm.     Pulses: Normal pulses.     Heart sounds: Normal heart sounds. No murmur heard. Pulmonary:     Effort: Pulmonary effort is normal.     Breath sounds: Normal breath sounds. No wheezing, rhonchi or rales.  Abdominal:     General: Abdomen is flat. Bowel sounds are normal. There is no  distension.     Palpations: Abdomen is soft.     Tenderness: There is no abdominal tenderness.  Musculoskeletal:        General: No swelling or deformity. Normal range of motion.     Cervical back: Normal range of motion.  Skin:    General: Skin is warm and dry.     Capillary Refill: Capillary refill takes less than 2 seconds.     Findings: Rash (Well-demarcated, erythematous rash in bilateral groin, appears most consistent with tinea cruris) present.  Neurological:     General: No focal deficit present.     Mental Status: He is alert and oriented to person, place, and time.     Motor: No weakness.  Psychiatric:        Mood and Affect: Mood normal.        Behavior: Behavior normal.        Thought Content: Thought content normal.   Last CBC Lab Results  Component Value Date   WBC 6.0 04/21/2023   HGB 16.8 04/21/2023   HCT 50.3 04/21/2023   MCV 94 04/21/2023   MCH 31.3 04/21/2023   RDW 12.8 04/21/2023   PLT 165 04/21/2023   Last metabolic panel Lab Results  Component Value Date   GLUCOSE 105 (H) 04/21/2023   NA 141 04/21/2023   K 4.2 04/21/2023   CL 104 04/21/2023   CO2 24 04/21/2023   BUN 13 04/21/2023   CREATININE 1.17 04/21/2023   EGFR 67 04/21/2023   CALCIUM 9.1 04/21/2023   PROT 6.3 04/21/2023   ALBUMIN 4.1 04/21/2023   LABGLOB 2.2 04/21/2023   AGRATIO 1.8 05/07/2022   BILITOT 0.5 04/21/2023   ALKPHOS 84 04/21/2023   AST 17 04/21/2023   ALT 17 04/21/2023   ANIONGAP 9 04/18/2023   Last lipids Lab Results  Component Value Date   CHOL 113 05/07/2022   HDL 63 05/07/2022   LDLCALC 26 05/07/2022   TRIG 142 05/07/2022   CHOLHDL 1.8 05/07/2022   Last hemoglobin A1c Lab Results  Component Value Date   HGBA1C 5.9 (H) 11/11/2022   Last thyroid functions Lab Results  Component Value Date   TSH 1.090 05/07/2022   Last vitamin D Lab Results  Component Value Date   VD25OH 50.8 05/07/2022   Last vitamin B12 and Folate Lab Results  Component Value Date    VITAMINB12 446 05/07/2022   FOLATE 5.3 05/07/2022     Assessment & Plan:   Problem List Items Addressed This Visit       CAD (coronary artery disease)   Denies recent chest pain.  Last seen by cardiology for follow-up in December.  Remains on Plavix, atorvastatin, Repatha, and carvedilol.      Type 2 diabetes mellitus with diabetic chronic kidney disease (HCC)   Hypoglycemic event in December, prompting him to call EMS.  Surrounding events and etiology remains unclear.  He established care with endocrinology (Dr. Roanna Raider) in January.  A1c 6.1.  He remains on Farxiga 10 mg daily. - Repeat A1c ordered today.        Tinea cruris - Primary   His additional concern today is an itching bilateral groin rash.  On exam, there is a well-demarcated, erythematous rash in the bilateral groin that appears most consistent with tinea cruris.  Ketoconazole 2% cream prescribed today.      Hyperlipemia   He is currently prescribed atorvastatin 80 mg daily and Repatha.  Repeat lipid panel ordered today.      Anxiety and depression   His acute concern today is poorly controlled symptoms of anxiety and depression.  He is currently prescribed Wellbutrin XL 300 mg daily and lorazepam 0.5 mg daily as needed.  He has felt more stressed and overwhelmed lately and is concerned that Wellbutrin is not as effective as it has been previously.  - Treatment options reviewed.  Through shared decision making, BuSpar 5 mg twice daily has been started.  Will adjust accordingly.  I have also placed a referral for counseling services.  Return in about 6 months (around 11/11/2023).   Billie Lade, MD

## 2023-05-12 NOTE — Assessment & Plan Note (Signed)
 Denies recent chest pain.  Last seen by cardiology for follow-up in December.  Remains on Plavix, atorvastatin, Repatha, and carvedilol.

## 2023-05-12 NOTE — Assessment & Plan Note (Signed)
 His additional concern today is an itching bilateral groin rash.  On exam, there is a well-demarcated, erythematous rash in the bilateral groin that appears most consistent with tinea cruris.  Ketoconazole 2% cream prescribed today.

## 2023-05-12 NOTE — Patient Instructions (Signed)
 It was a pleasure to see you today.  Thank you for giving [place] the opportunity to be involved in your care.  Below is a brief recap of your visit and next steps.  We will plan to see you again in 6 months.  Summary Buspar added for anxiety / depression Ketoconazole cream for groin rash Lorazepam refilled Repeat labs Follow up in 6 months

## 2023-05-12 NOTE — Assessment & Plan Note (Signed)
 Hypoglycemic event in December, prompting him to call EMS.  Surrounding events and etiology remains unclear.  He established care with endocrinology (Dr. Roanna Raider) in January.  A1c 6.1.  He remains on Farxiga 10 mg daily. - Repeat A1c ordered today.

## 2023-05-13 ENCOUNTER — Encounter: Payer: Self-pay | Admitting: Internal Medicine

## 2023-05-13 LAB — LIPID PANEL
Chol/HDL Ratio: 2.7 ratio (ref 0.0–5.0)
Cholesterol, Total: 115 mg/dL (ref 100–199)
HDL: 42 mg/dL (ref >39)
LDL Chol Calc (NIH): 28 mg/dL (ref 0–99)
Triglycerides: 309 mg/dL — ABNORMAL HIGH (ref 0–149)
VLDL Cholesterol Cal: 45 mg/dL — ABNORMAL HIGH (ref 5–40)

## 2023-05-13 LAB — TSH+FREE T4
Free T4: 1.12 ng/dL (ref 0.82–1.77)
TSH: 0.921 u[IU]/mL (ref 0.450–4.500)

## 2023-05-13 LAB — B12 AND FOLATE PANEL
Folate: 5.7 ng/mL (ref >3.0)
Vitamin B-12: 252 pg/mL (ref 232–1245)

## 2023-05-13 LAB — VITAMIN D 25 HYDROXY (VIT D DEFICIENCY, FRACTURES): Vit D, 25-Hydroxy: 39.9 ng/mL (ref 30.0–100.0)

## 2023-05-13 LAB — HEMOGLOBIN A1C
Est. average glucose Bld gHb Est-mCnc: 126 mg/dL
Hgb A1c MFr Bld: 6 % — ABNORMAL HIGH (ref 4.8–5.6)

## 2023-05-27 ENCOUNTER — Telehealth: Payer: Self-pay | Admitting: Neurology

## 2023-05-27 NOTE — Telephone Encounter (Signed)
Pt schedule follow up appt.

## 2023-05-30 DIAGNOSIS — E785 Hyperlipidemia, unspecified: Secondary | ICD-10-CM

## 2023-06-04 NOTE — Progress Notes (Signed)
 PATIENT: Justin Carroll DOB: May 21, 1955  REASON FOR VISIT: follow up HISTORY FROM: patient  Chief Complaint  Patient presents with   Follow-up    Pt in 1 alone Pt here for memory f/u Pt states memory same since last visit pt states diagnosed with diabetes,and hypertension      HISTORY OF PRESENT ILLNESS:  06/05/2023 ALL:  Justin Carroll returns for follow up for cognitive impairment. He was last seen by Dr Albertina Hugger 03/2022 and doing well on atomoxetine . Since, he feels he is doing fairly well. He feels memory is stable. He is tolerating Straterra without adverse effects. He was seen by Dr Cheryll Corti 10/2022. He did not recommend repeat neurocog testing.   He continues close follow up with Dr Kermit Ped, PCP. He continues Plavix  (cardiac stent) and atorvastatin . Last A1C 6 and LDL 28, 05/2023. B12 was low and oral supplements advised. He has added B12 and fish oil. Followed by Dr Micael Adas, cardiology. He continues CPAP nightly. Fatigue improved after lowering carvedilol  dose.   03/19/2022 CD: Justin Carroll is a 68 y.o. male patient who is here for revisit 03/19/2022 for RV on cognitive attention deficit: .  Chief concern according to patient : " Ia have decided to change from criminal law to custody and guardianship law, I felt overwhelmed. I have  not been good at multitasking. I was very fatigued in 2023 while my BP was aggressively treated and ended up with hypotension. I take Strattera " . The patient has been doing well on Strattera , he has to take more time to focus on one thing from a to z and he will not need to go to court as often. He hopes these changes in his professional life shall reduce the need to multi tasking. He will reschedule a follow up testing session with dr Cruz Door.   Today , 03-19-2022,  here to see if adjustments in strattera  can help further  10/22/2021 ALL: Justin Carroll returns for follow up for cognitive and attention deficit. He was last seen by Dr Albertina Hugger 04/2021 who started Strattera   for concerns of adult ADD. Per Dr Minerva Alvine neurocognitive evaluation: "results of the current neuropsychological evaluation do suggest that the greatest observed cognitive deficits relative to premorbid estimates were in areas primarily affected by attention and concentration, focus execute abilities and information processing speed".   He is tolerating Strattera . He feels that it may have helped a little with attention. He denies any cardiac symptoms. He is back to work full time. His practice has became extremely busy. He is able to participate in trials without difficulty. He is able to present information. He is able to keep files intact for cases but has difficulty with organization in the office. He has been mostly successful in his trials. His wife notes that he is not able to project his voice. Even when asked to speak loudly, he does not. His wife also notes that he constantly clears his throat. No difficulty eating or swallowing. He does feel some nasal congestion and drainage may contribute. He moves his mouth in a chewing motion or smacks lips throughout the day. He is not on any antipsychotics. She notes symptoms most everyday, all day. No change when he is on vacation or off work.   He feels mood is good. He is much more busy over the past few months He averages 8-10 new cases every week.   He continues close follow up with Dr Micael Adas for OSA on CPAP. Entresto  dose was lowered for hypotension.  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 His last 2 visits had been with Arletta Lumadue, NP-  There has been a lot happening in the meantime, he was severely shocked by the death of his MD and friend  Dr Ena Harries, and he developed a heart attack in September last year. He has an Evaluation with Dr Cruz Door- he could not rule out a fronto temporal dementia,. Mr. Shim stated his evaluation took place while he was sleep deprived  at the time, less than 6 hours each night, as he drove his son to work and had to pick him up.  Mrs. Arne Langdon reports that she feels that her husband is easier distracted but these is this is reflected in little things for example he asked him to bring her a mask from the front desk and when he sat down he had forgotten it.  Multitasking has still been hard for him. After his heart attack he was seen in the heart failure clinic were Dr. Starr Eddy conducted a sleep study I - a home sleep test .  On the home sleep test showed mild apnea AHI was 11 and it was obstructive.  Oxygen nadir 87% he was started on auto CPAP 4 through 15 cmH2O no EPR as mentioned, I do not know which mask he is using.  But he is apparently feeling more rested in the morning and significant improvement in daytime fatigue.   He is not sure how much of his concentration or cognitive ability has been affected.  We also had referred the patient to Dr. Cheryll Corti and Dr. Cheryll Corti felt that his issues were related.  A PET scan was normal.   There was just some microvascular ischemic changes on MRI and they would be even age-related normal. I felt this was not increased above peer group.   MoCA assessment in January and March 2021 was at first 26 and then 28 points, a repeat MoCA was done today at 31 out of 30 points.  Dr. Ema Hands clearly that the patient has memory difficulties and memory loss but looking at the individual subtests and scores suggest that the greatest issue has to do with a weakened encoding capacity and difficulty to retrieve information.  In fact the patient is able to retain the vast majority of the information he had and then initially learned over a.  Of delay.  Significant differences between predicted levels of performance versus active levels of performance for seen for visual memory.  The assessment was not consistent with pattern typically shown for the most common form of dementia which is Alzheimer's  disease but also there were not consistent with frontotemporal dementia.  09/14/2020 ALL:  Mr Bybee returns for follow up for memory loss and attention deficit. He continues Adderall IR 20mg  every morning and an additional 20mg  in the evenings if needed.   10/20/2019 ALL:  CHOL GRAFFIUS is a 68 y.o. male here today for follow up for memory loss. He was last seen in 04/2019 with normal MOCA. Question of dementia versus ADD. He discontinued Aricept  10mg  per PCP direction. Adderall was added in March, 2021. He felt that extended release made him more irritable. He switched to IR dosage and feels that it worked better. He notes a significant difference in ability to focus and concentrate. He stopped taking Adderall and noted a clear worsening in attention. He feels that memory is stable.  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 He feels that with less cases he will have time to focus and, in turn, decrease stress levels.    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  Evaluation of Memory.   He is accompanied by his wife. He has not gotten lost , he has not had a fall since last visit! Still grouchy, but he is doing better with orientation, his work flow had been restructured. He has resigned form jury trials after our first meeting. This reduced his stress level. He mentioned his stress level in court rose with COVID, the jury trials on [place].  He is sleeping well. He is reportedly a recovering alcoholic for 33 years , no medication , no drugs, he is on Wellbutrin . He is breathing better,, following a nasal septal surgery on march 4th. Recovering well. Dr Starling Eck.    His PET scan was normal (!) and his MOCA has a normal result today , too. We may not deal with a dementia at all. 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 Neuropsychological testing is pending. For JUNE (!)- Dr. Cruz Door.  Will see if he can see the new [place] neuropsychologist.  His wife tends to attribute his last years memory decline and recovery to reducing stress and distractions. He may have adult ADD. ADHD ? I will give a try to Ritalin .  He stays on Aricept  now 10 mg. requests a 90 day supply.   CD      Dr. Gosrani  added that the patient had come somewhat more irritable at times and his wife used the term "grouchy". He has not lost awareness or consciousness with any of his falls they don't seem to be syncope or seizures but they're sudden. He denies any lightheadedness or vertigo. He has not felt clumsy whilst walking and his speech has not changed he has also asked for an ENT referral as he has frequent sinus congestion. He has seen Dr Odean Bend, he will do a septal deviation repair.    The family history is positive for a father with coronary artery disease, pulmonary disease/ COPD , who was heavy smoker. A brother was post mortem diagnosed with vascular dementia.    There may have been a progression slowly over the last 3 years or so of memory loss being noticeable but over the last 6 months the family has noted a significant progression. Especially with getting lost on a familiar road on a routine drive. He has a completely disorganized office, he has misfiled papers, lost papers. His Diplomatic Services operational officer and his wife have kept him from taking charts home.    He has impaired depth perception, has trouble parking a larger vehicle, but stated he has no trouble reading.  He reportedly has been unimpaired when it comes to accounting, but this is mostly done by his wife.   He has very good and very bad days, is usually better in the morning when it comes to cognitive performance. He has lost words, skips sometimes words, non-fluent - he makes more small talk than actual conversation.  He denies nocturnal  activity, acting out dreams, but is a restless sleeper.    An MRI brain without contrast did not reveal abnormalities. Dr Ena Harries ordered.   Part of  our exam today was a Montreal cognitive assessment , abbreviated MOCA, and Mr. Furfaro scored 24 out of 30 points , placing him just at the borderline between a mild cognitive impairment and a beginning dementia.  There was impairment with visual-spatial problem-solving to copy a cube was difficult he could name three animals could repeat words and a series of letters he was very good for subtraction and mathematics he remembered 3 out of 5 delay words. His orientation to place and time was completely intact.   Montreal Cognitive Assessment  04/19/2019 02/18/2019  Visuospatial/ Executive (0/5) 4 3  Naming (0/3) 3 3  Attention: Read list of digits (0/2) 2 2  Attention: Read list of letters (0/1) 1 1  Attention: Serial 7 subtraction starting at 100 (0/3) 3 3  Language: Repeat phrase (0/2) 1 2  Language : Fluency (0/1) 1 1  Abstraction (0/2) 2 2  Delayed Recall (0/5) 5 3  Orientation (0/6) 6 6  Total 28 26       REVIEW OF SYSTEMS: Out of a complete 14 system review of symptoms, the patient complains only of the following symptoms, inattention, anxiety and all other reviewed systems are negative.  ALLERGIES: No Known Allergies  HOME MEDICATIONS: Outpatient Medications Prior to Visit  Medication Sig Dispense Refill   atorvastatin  (LIPITOR ) 80 MG tablet TAKE ONE (1) TABLET BY MOUTH EVERY DAY 90 tablet 2   Azelastine  HCl 137 MCG/SPRAY SOLN USE 2 SPRAY(S) IN EACH NOSTRIL TWICE DAILY AS NEEDED FOR RHINITIS 30 mL 2   Blood Glucose Monitoring Suppl DEVI 1 each by Does not apply route in the morning, at noon, and at bedtime. May substitute to any manufacturer covered by patient's insurance. 1 each 0   buPROPion  (WELLBUTRIN  XL) 300 MG 24 hr tablet TAKE ONE TABLET (300MG  TOTAL) BY MOUTH DAILY 90 tablet 0   busPIRone  (BUSPAR ) 5 MG tablet Take 1 tablet  (5 mg total) by mouth 2 (two) times daily. 60 tablet 2   carvedilol  (COREG ) 6.25 MG tablet TAKE ONE TABLET (6.25MG  TOTAL) BY MOUTH TWO TIMES DAILY WITH A MEAL 60 tablet 6   Cholecalciferol  (VITAMIN D -3 PO) Take 2,000 Units by mouth daily.     clopidogrel  (PLAVIX ) 75 MG tablet Take 1 tablet (75 mg total) by mouth daily. NEEDS FOLLOW UP APPOINTMENT FOR MORE REFILLS 90 tablet 0   Cyanocobalamin (B-12 PO) Take by mouth.     dapagliflozin  propanediol (FARXIGA ) 10 MG TABS tablet Take 1 tablet (10 mg total) by mouth daily. 90 tablet 3   Evolocumab  (REPATHA  SURECLICK) 140 MG/ML SOAJ Inject 140 mg into the skin every 14 (fourteen) days. 2 mL 12   ketoconazole  (NIZORAL ) 2 % cream Apply 1 Application topically daily. 30 g 0   levocetirizine (XYZAL ) 5 MG tablet Take 5 mg by mouth every evening.     LORazepam  (ATIVAN ) 0.5 MG tablet Take 1 tablet (0.5 mg total) by mouth daily as needed for anxiety. 30 tablet 0   mometasone  (NASONEX ) 50 MCG/ACT nasal spray Place 1 spray into the nose daily. 17 g 5   nitroGLYCERIN  (NITROSTAT ) 0.4 MG SL tablet Place 1 tablet (0.4 mg total) under the tongue every 5 (five) minutes as needed for chest pain. 25 tablet 3   Omega-3 Fatty Acids (FISH OIL PO) Take by mouth.     pantoprazole  (PROTONIX ) 40 MG tablet TAKE ONE TABLET (40MG  TOTAL) BY MOUTH DAILY 90 tablet 0   sacubitril -valsartan  (ENTRESTO ) 24-26 MG TAKE ONE TABLET BY MOUTH TWICE A  DAY 180 tablet 0   spironolactone  (ALDACTONE ) 25 MG tablet TAKE ONE TABLET (25 MG TOTAL) BY MOUTH AT BEDTIME. 30 tablet 11   tadalafil  (CIALIS ) 20 MG tablet Take 1 tablet (20 mg total) by mouth daily as needed. 30 tablet 5   tamsulosin  (FLOMAX ) 0.4 MG CAPS capsule TAKE ONE (1) TABLET BY MOUTH EVERY DAY AFTER SUPPER 90 capsule 3   testosterone  cypionate (DEPOTESTOSTERONE CYPIONATE) 200 MG/ML injection INJECT 0.5ML'S (100MG  TOTAL) INTO THE MUSCLE ONCE A WEEK 10 mL 3   traZODone  (DESYREL ) 100 MG tablet TAKE ONE TABLET (100MG  TOTAL) BY MOUTH AT BEDTIME  90 tablet 0   triamcinolone  cream (KENALOG ) 0.1 % Apply 1 Application topically daily as needed (irritation/rash).     atomoxetine  (STRATTERA ) 40 MG capsule TAKE ONE CAPSULE (40MG  TOTAL) BY MOUTH DAILY 90 capsule 1   Continuous Glucose Receiver (DEXCOM G7 RECEIVER) DEVI 1 Device by Does not apply route continuous. 1 each 0   Continuous Glucose Sensor (DEXCOM G7 SENSOR) MISC 1 Device by Does not apply route continuous. 1 each 3   Glucagon  (GVOKE HYPOPEN  2-PACK) 1 MG/0.2ML SOAJ Inject 1 mg into the skin as needed (hypoglycemia). (Patient not taking: Reported on 04/14/2023) 0.2 mL 1   No facility-administered medications prior to visit.    PAST MEDICAL HISTORY: Past Medical History:  Diagnosis Date   Allergy Lifetime   Mostly pollen   Anxiety Not sure   Ativan  prn. A few times a month   CKD (chronic kidney disease) stage 2, GFR 60-89 ml/min 05/07/2022   Depression    Encounter for general adult medical examination with abnormal findings 05/07/2022   GERD (gastroesophageal reflux disease) 5 years   Myocardial infarction (HCC) 10/17/2020   Stent   Sinus infection    yearly   Sleep apnea Diagnosed 2022   CPAP   Substance abuse (HCC) 40+ years ago   Sober since 11/19/1985    PAST SURGICAL HISTORY: Past Surgical History:  Procedure Laterality Date   ADENOIDECTOMY     COLONOSCOPY WITH PROPOFOL  N/A 04/23/2023   Procedure: COLONOSCOPY WITH PROPOFOL ;  Surgeon: Suzette Espy, MD;  Location: [facility];  Service: Endoscopy;  Laterality: N/A;  1030AM, ASA 3   CORONARY IMAGING/OCT N/A 10/18/2020   Procedure: INTRAVASCULAR IMAGING/OCT;  Surgeon: Wenona Hamilton, MD;  Location: [facility];  Service: Cardiovascular;  Laterality: N/A;   CORONARY STENT INTERVENTION N/A 10/18/2020   Procedure: CORONARY STENT INTERVENTION;  Surgeon: Wenona Hamilton, MD;  Location: [facility];  Service: Cardiovascular;  Laterality: N/A;   LEFT HEART CATH AND CORONARY ANGIOGRAPHY N/A 10/18/2020    Procedure: LEFT HEART CATH AND CORONARY ANGIOGRAPHY;  Surgeon: Wenona Hamilton, MD;  Location: [facility];  Service: Cardiovascular;  Laterality: N/A;   LEFT HEART CATH AND CORONARY ANGIOGRAPHY N/A 09/30/2022   Procedure: LEFT HEART CATH AND CORONARY ANGIOGRAPHY;  Surgeon: Avanell Leigh, MD;  Location: [facility];  Service: Cardiovascular;  Laterality: N/A;   NASAL SEPTUM SURGERY     SINOSCOPY     TONSILLECTOMY     age 47    FAMILY HISTORY: Family History  Problem Relation Age of Onset   Stroke Mother    Alcohol abuse Father    Heart disease Father    Allergic rhinitis Brother    Alcohol abuse Brother    Anxiety disorder Brother    Depression Brother    Depression Brother    Drug abuse Brother  Stroke Brother    Dementia Brother    Heart disease Paternal Uncle    Heart disease Paternal Uncle    Colon cancer Neg Hx    Pancreatic cancer Neg Hx    Rectal cancer Neg Hx    Stomach cancer Neg Hx     SOCIAL HISTORY: Social History   Socioeconomic History   Marital status: Married    Spouse name: Leisure centre manager   Number of children: 7   Years of education: Not on file   Highest education level: Professional school degree (e.g., MD, DDS, DVM, JD)  Occupational History   Occupation: Lawyer    Comment: self employeed  Tobacco Use   Smoking status: Former    Current packs/day: 0.00    Average packs/day: 1.5 packs/day for 20.0 years (30.0 ttl pk-yrs)    Types: Cigarettes    Start date: 77    Quit date: 02/04/2013    Years since quitting: 10.3   Smokeless tobacco: Never  Vaping Use   Vaping status: Never Used  Substance and Sexual Activity   Alcohol use: Not Currently   Drug use: No   Sexual activity: Yes    Birth control/protection: Surgical  Other Topics Concern   Not on file  Social History Narrative   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   Pt lives with family    Social Drivers of Health   Financial Resource Strain: Low Risk  (05/09/2023)    Overall Financial Resource Strain (CARDIA)    Difficulty of Paying Living Expenses: Not very hard  Food Insecurity: No Food Insecurity (05/09/2023)   Hunger Vital Sign    Worried About Running Out of Food in the Last Year: Never true    Ran Out of Food in the Last Year: Never true  Transportation Needs: No Transportation Needs (05/09/2023)   PRAPARE - Administrator, Civil Service (Medical): No    Lack of Transportation (Non-Medical): No  Physical Activity: Insufficiently Active (05/09/2023)   Exercise Vital Sign    Days of Exercise per Week: 3 days    Minutes of Exercise per Session: 20 min  Stress: Stress Concern Present (05/09/2023)   [employer] of Occupational Health - Occupational Stress Questionnaire    Feeling of Stress : To some extent  Social Connections: Socially Integrated (05/09/2023)   Social Connection and Isolation Panel [NHANES]    Frequency of Communication with Friends and Family: More than three times a week    Frequency of Social Gatherings with Friends and Family: Three times a week    Attends Religious Services: More than 4 times per year    Active Member of Clubs or Organizations: Yes    Attends Banker Meetings: More than 4 times per year    Marital Status: Married  Catering manager Violence: Not At Risk (12/20/2022)   Humiliation, Afraid, Rape, and Kick questionnaire    Fear of Current or Ex-Partner: No    Emotionally Abused: No    Physically Abused: No    Sexually Abused: No      PHYSICAL EXAM  Vitals:   06/05/23 0800  BP: 103/68  Pulse: 67  Weight: 181 lb (82.1 kg)  Height: 5\' 6"  (1.676 m)      Body mass index is 29.21 kg/m.  Generalized: Well developed, in no acute distress  Cardiology: normal rate and rhythm, no murmur noted Respiratory: clear to auscultation bilaterally  Neurological examination  Mentation: Alert oriented to time, place, history taking. Follows all commands  speech and language fluent Cranial  nerve II-XII: Pupils were equal round reactive to light. Extraocular movements were full, visual field were full  Motor: The motor testing reveals 5 over 5 strength of all 4 extremities. Good symmetric motor tone is noted throughout. No bradykinesia, no dysthymias with toe taps or finger taps. No abnormal dystonias notes or concerns of tardive dyskinesias.  Coordination: finger to nose intact.  Gait and station: Station is normal. Gait is normal.    DIAGNOSTIC DATA (LABS, IMAGING, TESTING) - I reviewed patient records, labs, notes, testing and imaging myself where available.      No data to display           Lab Results  Component Value Date   WBC 6.0 04/21/2023   HGB 16.8 04/21/2023   HCT 50.3 04/21/2023   MCV 94 04/21/2023   PLT 165 04/21/2023      Component Value Date/Time   NA 141 04/21/2023 0852   K 4.2 04/21/2023 0852   CL 104 04/21/2023 0852   CO2 24 04/21/2023 0852   GLUCOSE 105 (H) 04/21/2023 0852   GLUCOSE 68 (L) 04/18/2023 1010   BUN 13 04/21/2023 0852   CREATININE 1.17 04/21/2023 0852   CREATININE 1.29 (H) 06/15/2020 0000   CALCIUM  9.1 04/21/2023 0852   PROT 6.3 04/21/2023 0852   ALBUMIN 4.1 04/21/2023 0852   AST 17 04/21/2023 0852   ALT 17 04/21/2023 0852   ALKPHOS 84 04/21/2023 0852   BILITOT 0.5 04/21/2023 0852   GFRNONAA >60 04/18/2023 1010   GFRNONAA 57 (L) 06/15/2020 0000   GFRAA 67 06/15/2020 0000   Lab Results  Component Value Date   CHOL 115 05/12/2023   HDL 42 05/12/2023   LDLCALC 28 05/12/2023   TRIG 309 (H) 05/12/2023   CHOLHDL 2.7 05/12/2023   Lab Results  Component Value Date   HGBA1C 6.0 (H) 05/12/2023   Lab Results  Component Value Date   VITAMINB12 252 05/12/2023   Lab Results  Component Value Date   TSH 0.921 05/12/2023       06/05/2023    8:01 AM 03/19/2022    1:15 PM 10/22/2021   12:55 PM 04/18/2021    1:27 PM 09/14/2020    7:55 AM  Montreal Cognitive Assessment   Visuospatial/ Executive (0/5) 4 4 2 4 4   Naming  (0/3) 3 3 3 3 3   Attention: Read list of digits (0/2) 2 2 2 2 2   Attention: Read list of letters (0/1) 1 1 1 1 1   Attention: Serial 7 subtraction starting at 100 (0/3) 1 3 1 3 3   Language: Repeat phrase (0/2) 2 2 2 2 1   Language : Fluency (0/1) 1 1 1 1 1   Abstraction (0/2) 2 2 2 2 2   Delayed Recall (0/5) 3 5 5 5 5   Orientation (0/6) 6 6 6 6 5   Total 25 29 25 29 27   Adjusted Score (based on education)   25  27     ASSESSMENT AND PLAN 68 y.o. year old male  has a past medical history of Allergy (Lifetime), Anxiety (Not sure), CKD (chronic kidney disease) stage 2, GFR 60-89 ml/min (05/07/2022), Depression, Encounter for general adult medical examination with abnormal findings (05/07/2022), GERD (gastroesophageal reflux disease) (5 years), Myocardial infarction (HCC) (10/17/2020), Sinus infection, Sleep apnea (Diagnosed 2022), and Substance abuse (HCC) (40+ years ago). here with     ICD-10-CM   1. Cognitive attention deficit  R41.840        Mr  Robitaille is doing well, today. He has noted improvement in concentration on low dose Strattera . We will continue 40mg  daily. MOCA stable, 25/30. We have reviewed memory compensation strategies. Healthy lifestyle habits encouraged. He will follow up with PCP regularly. He will follow up with Dr Albertina Hugger in 1 year.    No orders of the defined types were placed in this encounter.     Meds ordered this encounter  Medications   atomoxetine  (STRATTERA ) 40 MG capsule    Sig: Take 1 capsule (40 mg total) by mouth daily.    Dispense:  90 capsule    Refill:  3    Supervising Provider:   AHERN, ANTONIA B [id number]      I spent 30 minutes of face-to-face and non-face-to-face time with patient.  This included previsit chart review, lab review, study review, order entry, electronic health record documentation, patient education.   Terrilyn Fick, FNP-C 06/05/2023, 8:40 AM [facility] [address] [place], [place] [address] [phone]

## 2023-06-04 NOTE — Patient Instructions (Signed)
 Below is our plan:  We will continue Straterra 40mg  daily.   Please make sure you are staying well hydrated. I recommend 50-60 ounces daily. Well balanced diet and regular exercise encouraged. Consistent sleep schedule with 6-8 hours recommended.   Please continue follow up with care team as directed.   Follow up with Dr Albertina Hugger in 1 year   You may receive a survey regarding today's visit. I encourage you to leave honest feed back as I do use this information to improve patient care. Thank you for seeing me today!   Management of Memory Problems   There are some general things you can do to help manage your memory problems.  Your memory may not in fact recover, but by using techniques and strategies you will be able to manage your memory difficulties better.   1)  Establish a routine. Try to establish and then stick to a regular routine.  By doing this, you will get used to what to expect and you will reduce the need to rely on your memory.  Also, try to do things at the same time of day, such as taking your medication or checking your calendar first thing in the morning. Think about think that you can do as a part of a regular routine and make a list.  Then enter them into a daily planner to remind you.  This will help you establish a routine.   2)  Organize your environment. Organize your environment so that it is uncluttered.  Decrease visual stimulation.  Place everyday items such as keys or cell phone in the same place every day (ie.  Basket next to front door) Use post it notes with a brief message to yourself (ie. Turn off light, lock the door) Use labels to indicate where things go (ie. Which cupboards are for food, dishes, etc.) Keep a notepad and pen by the telephone to take messages   3)  Memory Aids A diary or journal/notebook/daily planner Making a list (shopping list, chore list, to do list that needs to be done) Using an alarm as a reminder (kitchen timer or cell phone  alarm) Using cell phone to store information (Notes, Calendar, Reminders) Calendar/White board placed in a prominent position Post-it notes   In order for memory aids to be useful, you need to have good habits.  It's no good remembering to make a note in your journal if you don't remember to look in it.  Try setting aside a certain time of day to look in journal.   4)  Improving mood and managing fatigue. There may be other factors that contribute to memory difficulties.  Factors, such as anxiety, depression and tiredness can affect memory. Regular gentle exercise can help improve your mood and give you more energy. Exercise: there are short videos created by the [employer] on Health specially for older adults: https://bit.ly/2I30q97.  Mediterranean diet: which emphasizes fruits, vegetables, whole grains, legumes, fish, and other seafood; unsaturated fats such as olive oils; and low amounts of red meat, eggs, and sweets. A variation of this, called MIND (Mediterranean-DASH Intervention for Neurodegenerative Delay) incorporates the DASH (Dietary Approaches to Stop Hypertension) diet, which has been shown to lower high blood pressure, a risk factor for Alzheimer's disease. More information at: ExitMarketing.de.  Aerobic exercise that improve heart health is also good for the mind.  [employer] on Aging have short videos for exercises that you can do at home: BlindWorkshop.com.pt Simple relaxation techniques may help relieve symptoms  of anxiety Try to get back to completing activities or hobbies you enjoyed doing in the past. Learn to pace yourself through activities to decrease fatigue. Find out about some local support groups where you can share experiences with others. Try and achieve 7-8 hours of sleep at night.   Tasks to improve attention/working memory 1. Good sleep hygiene (7-8 hrs of sleep) 2.  Learning a new skill (Painting, Carpentry, Pottery, new language, Knitting). 3.Cognitive exercises (keep a daily journal, Puzzles) 4. Physical exercise and training  (30 min/day X 4 days week) 5. Being on Antidepressant if needed 6.Yoga, Meditation, Tai Chi 7. Decrease alcohol intake 8.Have a clear schedule and structure in daily routine   MIND Diet: The Mediterranean-DASH Diet Intervention for Neurodegenerative Delay, or MIND diet, targets the health of the aging brain. Research participants with the highest MIND diet scores had a significantly slower rate of cognitive decline compared with those with the lowest scores. The effects of the MIND diet on cognition showed greater effects than either the Mediterranean or the DASH diet alone.   The healthy items the MIND diet guidelines suggest include:   3+ servings a day of whole grains 1+ servings a day of vegetables (other than green leafy) 6+ servings a week of green leafy vegetables 5+ servings a week of nuts 4+ meals a week of beans 2+ servings a week of berries 2+ meals a week of poultry 1+ meals a week of fish Mainly olive oil if added fat is used   The unhealthy items, which are higher in saturated and trans fat, include: Less than 5 servings a week of pastries and sweets Less than 4 servings a week of red meat (including beef, pork, lamb, and products made from these meats) Less than one serving a week of cheese and fried foods Less than 1 tablespoon a day of butter/stick margarine

## 2023-06-05 ENCOUNTER — Ambulatory Visit: Admitting: Family Medicine

## 2023-06-05 ENCOUNTER — Other Ambulatory Visit: Payer: Self-pay | Admitting: Internal Medicine

## 2023-06-05 ENCOUNTER — Encounter: Payer: Self-pay | Admitting: Family Medicine

## 2023-06-05 VITALS — BP 103/68 | HR 67 | Ht 66.0 in | Wt 181.0 lb

## 2023-06-05 DIAGNOSIS — R41840 Attention and concentration deficit: Secondary | ICD-10-CM

## 2023-06-05 DIAGNOSIS — K219 Gastro-esophageal reflux disease without esophagitis: Secondary | ICD-10-CM

## 2023-06-05 MED ORDER — ATOMOXETINE HCL 40 MG PO CAPS: 40.0000 mg | ORAL_CAPSULE | Freq: Every day | ORAL | 3 refills | Status: AC

## 2023-06-09 ENCOUNTER — Encounter: Payer: Self-pay | Admitting: Urology

## 2023-06-10 MED ORDER — CYCLOBENZAPRINE HCL 5 MG PO TABS: 5.0000 mg | ORAL_TABLET | Freq: Three times a day (TID) | ORAL | 1 refills | Status: DC | PRN

## 2023-06-12 ENCOUNTER — Other Ambulatory Visit (HOSPITAL_COMMUNITY): Payer: Self-pay | Admitting: Cardiology

## 2023-06-23 ENCOUNTER — Other Ambulatory Visit: Payer: Self-pay | Admitting: Internal Medicine

## 2023-06-23 DIAGNOSIS — F418 Other specified anxiety disorders: Secondary | ICD-10-CM

## 2023-06-26 ENCOUNTER — Encounter: Payer: Self-pay | Admitting: Cardiovascular Disease

## 2023-07-04 ENCOUNTER — Other Ambulatory Visit: Payer: Self-pay | Admitting: Internal Medicine

## 2023-07-04 DIAGNOSIS — E1122 Type 2 diabetes mellitus with diabetic chronic kidney disease: Secondary | ICD-10-CM

## 2023-07-07 ENCOUNTER — Other Ambulatory Visit: Payer: Self-pay

## 2023-07-07 MED ORDER — ACCU-CHEK SOFTCLIX LANCETS MISC: 1.0000 | Freq: Three times a day (TID) | 2 refills | Status: AC

## 2023-07-08 ENCOUNTER — Other Ambulatory Visit: Payer: Self-pay | Admitting: Internal Medicine

## 2023-07-08 ENCOUNTER — Other Ambulatory Visit (HOSPITAL_COMMUNITY): Payer: Self-pay | Admitting: Cardiology

## 2023-07-08 DIAGNOSIS — F418 Other specified anxiety disorders: Secondary | ICD-10-CM

## 2023-07-09 DIAGNOSIS — E1169 Type 2 diabetes mellitus with other specified complication: Secondary | ICD-10-CM | POA: Diagnosis not present

## 2023-07-09 DIAGNOSIS — E119 Type 2 diabetes mellitus without complications: Secondary | ICD-10-CM | POA: Diagnosis not present

## 2023-07-09 DIAGNOSIS — I2510 Atherosclerotic heart disease of native coronary artery without angina pectoris: Secondary | ICD-10-CM | POA: Diagnosis not present

## 2023-07-09 DIAGNOSIS — I255 Ischemic cardiomyopathy: Secondary | ICD-10-CM | POA: Diagnosis not present

## 2023-07-09 DIAGNOSIS — N182 Chronic kidney disease, stage 2 (mild): Secondary | ICD-10-CM | POA: Diagnosis not present

## 2023-07-28 ENCOUNTER — Other Ambulatory Visit (HOSPITAL_COMMUNITY): Payer: Self-pay | Admitting: Cardiology

## 2023-07-28 ENCOUNTER — Other Ambulatory Visit: Payer: Self-pay | Admitting: Internal Medicine

## 2023-07-28 DIAGNOSIS — F418 Other specified anxiety disorders: Secondary | ICD-10-CM

## 2023-08-13 ENCOUNTER — Ambulatory Visit (HOSPITAL_COMMUNITY): Admitting: Psychiatry

## 2023-08-18 ENCOUNTER — Other Ambulatory Visit: Payer: Self-pay | Admitting: Internal Medicine

## 2023-08-18 DIAGNOSIS — G4700 Insomnia, unspecified: Secondary | ICD-10-CM

## 2023-08-25 ENCOUNTER — Other Ambulatory Visit: Payer: Self-pay | Admitting: Internal Medicine

## 2023-08-25 ENCOUNTER — Other Ambulatory Visit: Payer: Self-pay

## 2023-08-25 DIAGNOSIS — F418 Other specified anxiety disorders: Secondary | ICD-10-CM

## 2023-08-25 DIAGNOSIS — F419 Anxiety disorder, unspecified: Secondary | ICD-10-CM

## 2023-09-02 ENCOUNTER — Other Ambulatory Visit: Payer: Self-pay | Admitting: Internal Medicine

## 2023-09-02 DIAGNOSIS — K219 Gastro-esophageal reflux disease without esophagitis: Secondary | ICD-10-CM

## 2023-09-08 ENCOUNTER — Encounter: Payer: Self-pay | Admitting: Cardiovascular Disease

## 2023-09-12 DIAGNOSIS — E785 Hyperlipidemia, unspecified: Secondary | ICD-10-CM | POA: Diagnosis not present

## 2023-09-12 LAB — LIPID PANEL

## 2023-09-13 LAB — LIPID PANEL
Chol/HDL Ratio: 2.1 ratio (ref 0.0–5.0)
Cholesterol, Total: 122 mg/dL (ref 100–199)
HDL: 57 mg/dL (ref >39)
LDL Chol Calc (NIH): 46 mg/dL (ref 0–99)
Triglycerides: 103 mg/dL (ref 0–149)
VLDL Cholesterol Cal: 19 mg/dL (ref 5–40)

## 2023-09-15 ENCOUNTER — Ambulatory Visit: Attending: Cardiovascular Disease | Admitting: Cardiovascular Disease

## 2023-09-15 ENCOUNTER — Encounter: Payer: Self-pay | Admitting: Cardiovascular Disease

## 2023-09-15 VITALS — BP 90/64 | HR 78 | Ht 66.0 in | Wt 176.6 lb

## 2023-09-15 DIAGNOSIS — E782 Mixed hyperlipidemia: Secondary | ICD-10-CM | POA: Diagnosis not present

## 2023-09-15 DIAGNOSIS — I214 Non-ST elevation (NSTEMI) myocardial infarction: Secondary | ICD-10-CM | POA: Diagnosis not present

## 2023-09-15 DIAGNOSIS — E785 Hyperlipidemia, unspecified: Secondary | ICD-10-CM | POA: Diagnosis not present

## 2023-09-15 DIAGNOSIS — I2510 Atherosclerotic heart disease of native coronary artery without angina pectoris: Secondary | ICD-10-CM

## 2023-09-15 DIAGNOSIS — I519 Heart disease, unspecified: Secondary | ICD-10-CM | POA: Diagnosis not present

## 2023-09-15 DIAGNOSIS — I10 Essential (primary) hypertension: Secondary | ICD-10-CM | POA: Diagnosis not present

## 2023-09-15 NOTE — Assessment & Plan Note (Signed)
 History of LV dysfunction thought to be ischemically mediated.  He was sent to the advanced heart failure clinic and seen saw Dr. McClain.  He is on GDMT.  He is active and asymptomatic.  Did have a cardiac MRI that showed EF of 51% and recent 2D echo performed 1 year ago confirming that with an EF of 45 to 50% with some focal wall motion abnormalities, no significant valvular abnormalities and mild dilatation of the ascending aorta measuring 41 mm.

## 2023-09-15 NOTE — Assessment & Plan Note (Signed)
 History of hyperlipidemia on Repatha  with lipid profile performed/7/25 revealing total cholesterol 115, LDL 28 and HDL 42.

## 2023-09-15 NOTE — Patient Instructions (Signed)
 Medication Instructions:  Your physician recommends that you continue on your current medications as directed. Please refer to the Current Medication list given to you today.  *If you need a refill on your cardiac medications before your next appointment, please call your pharmacy*   Testing/Procedures: Your physician has requested that you have an echocardiogram. Echocardiography is a painless test that uses sound waves to create images of your heart. It provides your doctor with information about the size and shape of your heart and how well your heart's chambers and valves are working. This procedure takes approximately one hour. There are no restrictions for this procedure. Please do NOT wear cologne, perfume, aftershave, or lotions (deodorant is allowed). Please arrive 15 minutes prior to your appointment time.  Please note: We ask at that you not bring children with you during ultrasound (echo/ vascular) testing. Due to room size and safety concerns, children are not allowed in the ultrasound rooms during exams. Our front office staff cannot provide observation of children in our lobby area while testing is being conducted. An adult accompanying a patient to their appointment will only be allowed in the ultrasound room at the discretion of the ultrasound technician under special circumstances. We apologize for any inconvenience.  **To do in August 2026**   Follow-Up: At [facility], you and your health needs are our priority.  As part of our continuing mission to provide you with exceptional heart care, our providers are all part of one team.  This team includes your primary Cardiologist (physician) and Advanced Practice Providers or APPs (Physician Assistants and Nurse Practitioners) who all work together to provide you with the care you need, when you need it.  Your next appointment:   12 month(s)  Provider:   Dorn Lesches, MD    We recommend signing up for the patient  portal called MyChart.  Sign up information is provided on this After Visit Summary.  MyChart is used to connect with patients for Virtual Visits (Telemedicine).  Patients are able to view lab/test results, encounter notes, upcoming appointments, etc.  Non-urgent messages can be sent to your provider as well.   To learn more about what you can do with MyChart, go to [email or web address].

## 2023-09-15 NOTE — Assessment & Plan Note (Signed)
 History of CAD status post non-STEMI September 2022.  He had PCI and stenting of the apical circumflex by Dr. Darron.  I recathed him 09/30/2022 revealing a widely patent stent with a low LVEDP.  He denies chest pain or shortness of breath.

## 2023-09-15 NOTE — Progress Notes (Signed)
 09/15/2023 DICKEY CAAMANO   1956/01/08  [id number]  Primary Physician Melvenia Manus FORBES, MD Primary Cardiologist: Dorn JINNY Lesches MD [facility] [place], [place]  HPI:  Justin Carroll is a 68 y.o.   mildly overweight married Caucasian male father of 7 children (2 of whom were adopted) grandfather 2 grandchildren, who I last saw in the office 09/27/2022.  Since that time he has been seen by Dr. Rolan in the advanced heart failure clinic for medication optimization.  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  He is currently in the process of trying to pare this down.  I last saw him in the office 04/02/2022.  He is accompanied by his wife [name] today.  His cardiac risk factor include 30 pack years tobacco abuse having quit 9 years ago, treated hypertension and hyperlipidemia.  His father did die of a myocardial infarction at age 17.  He had a non-STEMI September 2022 and had PCI drug-eluting stenting of the AV groove circumflex by Dr. Darron.  His medications for ischemic cardiomyopathy have been optimized by Dr. Rolan.  His most recent EF by cardiac MRI was 51%.   Since I saw him in the office 1 year ago I did do a heart cath on him 09/30/2022 revealing a widely patent stent with no other obstructive disease.  His LVEDP was low at 4.  Cardiac MRI showed an EF of 51%.  Recent 2D echo performed 1 year ago showed an EF of 45 to 50% with focal wall motion abnormalities, no valvular abnormalities and mildly dilated aortic root at 41 mm.  He still fairly active and asymptomatic.  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  Current Meds  Medication Sig   Accu-Chek Softclix Lancets lancets 1 each by Other route in the morning, at noon, and at bedtime.   atomoxetine  (STRATTERA ) 40 MG capsule Take 1 capsule (40 mg total) by mouth daily.   atorvastatin  (LIPITOR ) 80 MG tablet TAKE ONE (1) TABLET BY MOUTH EVERY DAY   Azelastine  HCl 137 MCG/SPRAY SOLN USE 2 SPRAY(S) IN EACH  NOSTRIL TWICE DAILY AS NEEDED FOR RHINITIS   Blood Glucose Monitoring Suppl DEVI 1 each by Does not apply route in the morning, at noon, and at bedtime. May substitute to any manufacturer covered by patient's insurance.   buPROPion  (WELLBUTRIN  XL) 300 MG 24 hr tablet TAKE ONE TABLET (300MG  TOTAL) BY MOUTH DAILY   busPIRone  (BUSPAR ) 5 MG tablet TAKE ONE TABLET (5MG  TOTAL) BY MOUTH TWOTIMES DAILY   carvedilol  (COREG ) 6.25 MG tablet TAKE ONE TABLET (6.25MG  TOTAL) BY MOUTH TWO TIMES DAILY WITH A MEAL   Cholecalciferol  (VITAMIN D -3 PO) Take 2,000 Units by mouth daily.   clopidogrel  (PLAVIX ) 75 MG tablet Take 1 tablet (75 mg total) by mouth daily. NEEDS FOLLOW UP APPOINTMENT FOR MORE REFILLS   Cyanocobalamin  (B-12 PO) Take by mouth.   dapagliflozin  propanediol (FARXIGA ) 10 MG TABS tablet Take 1 tablet (10 mg total) by mouth daily. NEEDS FOLLOW UP APPOINTMENT FOR MORE REFILLS   ENTRESTO  24-26 MG TAKE ONE TABLET BY MOUTH TWICE A DAY   Evolocumab  (REPATHA  SURECLICK) 140 MG/ML SOAJ Inject 140 mg into the skin every 14 (fourteen) days.   glucose blood (ACCU-CHEK GUIDE TEST) test strip USE TO TEST BLOOD SUGAR IN THE MORNING, AT NOON, AND AT BEDTIME.   guaiFENesin (MUCINEX PO) Take 1,200 mg by mouth 2 (two) times daily.  levocetirizine (XYZAL ) 5 MG tablet Take 5 mg by mouth every evening.   LORazepam  (ATIVAN ) 0.5 MG tablet TAKE ONE TABLET (0.5MG  TOTAL) BY MOUTH DAILY AS NEEDED FOR ANXIETY   mometasone  (NASONEX ) 50 MCG/ACT nasal spray Place 1 spray into the nose daily.   Omega-3 Fatty Acids (FISH OIL PO) Take by mouth.   pantoprazole  (PROTONIX ) 40 MG tablet TAKE ONE TABLET (40MG  TOTAL) BY MOUTH DAILY   spironolactone  (ALDACTONE ) 25 MG tablet TAKE ONE TABLET (25 MG TOTAL) BY MOUTH AT BEDTIME.   tadalafil  (CIALIS ) 20 MG tablet Take 1 tablet (20 mg total) by mouth daily as needed.   tamsulosin  (FLOMAX ) 0.4 MG CAPS capsule TAKE ONE (1) TABLET BY MOUTH EVERY DAY AFTER SUPPER   testosterone  cypionate  (DEPOTESTOSTERONE CYPIONATE) 200 MG/ML injection INJECT 0.5ML'S (100MG  TOTAL) INTO THE MUSCLE ONCE A WEEK   traZODone  (DESYREL ) 100 MG tablet TAKE ONE TABLET (100MG  TOTAL) BY MOUTH AT BEDTIME   triamcinolone  cream (KENALOG ) 0.1 % Apply 1 Application topically daily as needed (irritation/rash).     No Known Allergies  Social History   Socioeconomic History   Marital status: Married    Spouse name: Leisure centre manager   Number of children: 7   Years of education: Not on file   Highest education level: Professional school degree (e.g., MD, DDS, DVM, JD)  Occupational History   Occupation: Lawyer    Comment: self employeed  Tobacco Use   Smoking status: Former    Current packs/day: 0.00    Average packs/day: 1.5 packs/day for 20.0 years (30.0 ttl pk-yrs)    Types: Cigarettes    Start date: 13    Quit date: 02/04/2013    Years since quitting: 10.6   Smokeless tobacco: Never  Vaping Use   Vaping status: Never Used  Substance and Sexual Activity   Alcohol use: Not Currently   Drug use: No   Sexual activity: Yes    Birth control/protection: Surgical  Other Topics Concern   Not on file  Social History Narrative   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   Pt lives with family    Social Drivers of Health   Financial Resource Strain: Low Risk  (05/09/2023)   Overall Financial Resource Strain (CARDIA)    Difficulty of Paying Living Expenses: Not very hard  Food Insecurity: No Food Insecurity (05/09/2023)   Hunger Vital Sign    Worried About Running Out of Food in the Last Year: Never true    Ran Out of Food in the Last Year: Never true  Transportation Needs: No Transportation Needs (05/09/2023)   PRAPARE - Administrator, Civil Service (Medical): No    Lack of Transportation (Non-Medical): No  Physical Activity: Insufficiently Active (05/09/2023)   Exercise Vital Sign    Days of Exercise per Week: 3 days    Minutes of Exercise per Session: 20 min  Stress: Stress Concern Present  (05/09/2023)   [employer] of Occupational Health - Occupational Stress Questionnaire    Feeling of Stress : To some extent  Social Connections: Socially Integrated (05/09/2023)   Social Connection and Isolation Panel    Frequency of Communication with Friends and Family: More than three times a week    Frequency of Social Gatherings with Friends and Family: Three times a week    Attends Religious Services: More than 4 times per year    Active Member of Clubs or Organizations: Yes    Attends Banker Meetings: More than 4 times per year  Marital Status: Married  Catering manager Violence: Not At Risk (12/20/2022)   Humiliation, Afraid, Rape, and Kick questionnaire    Fear of Current or Ex-Partner: No    Emotionally Abused: No    Physically Abused: No    Sexually Abused: No     Review of Systems: General: negative for chills, fever, night sweats or weight changes.  Cardiovascular: negative for chest pain, dyspnea on exertion, edema, orthopnea, palpitations, paroxysmal nocturnal dyspnea or shortness of breath Dermatological: negative for rash Respiratory: negative for cough or wheezing Urologic: negative for hematuria Abdominal: negative for nausea, vomiting, diarrhea, bright red blood per rectum, melena, or hematemesis Neurologic: negative for visual changes, syncope, or dizziness All other systems reviewed and are otherwise negative except as noted above.    Blood pressure 90/64, pulse 78, height 5' 6 (1.676 m), weight 176 lb 9.6 oz (80.1 kg), SpO2 94%.  General appearance: alert and no distress Neck: no adenopathy, no carotid bruit, no JVD, supple, symmetrical, trachea midline, and thyroid  not enlarged, symmetric, no tenderness/mass/nodules Lungs: clear to auscultation bilaterally Heart: regular rate and rhythm, S1, S2 normal, no murmur, click, rub or gallop Extremities: extremities normal, atraumatic, no cyanosis or edema Pulses: 2+ and symmetric Skin: Skin  color, texture, turgor normal. No rashes or lesions Neurologic: Grossly normal  EKG EKG Interpretation Date/Time:  Monday September 15 2023 08:03:50 EDT Ventricular Rate:  78 PR Interval:  156 QRS Duration:  90 QT Interval:  378 QTC Calculation: 430 R Axis:   3  Text Interpretation: Normal sinus rhythm Normal ECG When compared with ECG of 27-Sep-2022 14:07, Premature ventricular complexes are no longer Present Incomplete right bundle branch block is no longer Present Confirmed by Court Carrier [phone]) on 09/15/2023 8:07:36 AM    ASSESSMENT AND PLAN:   NSTEMI (non-ST elevated myocardial infarction) [facility]) History of CAD status post non-STEMI September 2022.  He had PCI and stenting of the apical circumflex by Dr. Darron.  I recathed him 09/30/2022 revealing a widely patent stent with a low LVEDP.  He denies chest pain or shortness of breath.  Hyperlipemia History of hyperlipidemia on Repatha  with lipid profile performed/7/25 revealing total cholesterol 115, LDL 28 and HDL 42.  Left ventricular dysfunction History of LV dysfunction thought to be ischemically mediated.  He was sent to the advanced heart failure clinic and seen saw Dr. McClain.  He is on GDMT.  He is active and asymptomatic.  Did have a cardiac MRI that showed EF of 51% and recent 2D echo performed 1 year ago confirming that with an EF of 45 to 50% with some focal wall motion abnormalities, no significant valvular abnormalities and mild dilatation of the ascending aorta measuring 41 mm.     Carrier DOROTHA Court MD FACP,FACC,FAHA, [facility] 09/15/2023 8:16 AM

## 2023-09-22 ENCOUNTER — Other Ambulatory Visit (HOSPITAL_COMMUNITY): Payer: Self-pay | Admitting: Cardiology

## 2023-09-22 ENCOUNTER — Other Ambulatory Visit: Payer: Self-pay

## 2023-09-22 DIAGNOSIS — F418 Other specified anxiety disorders: Secondary | ICD-10-CM

## 2023-09-24 ENCOUNTER — Telehealth (HOSPITAL_COMMUNITY): Payer: Self-pay

## 2023-09-24 ENCOUNTER — Encounter: Payer: Self-pay | Admitting: Cardiovascular Disease

## 2023-09-24 MED ORDER — CLOPIDOGREL BISULFATE 75 MG PO TABS: 75.0000 mg | ORAL_TABLET | Freq: Every day | ORAL | 3 refills | Status: AC

## 2023-09-24 NOTE — Telephone Encounter (Signed)
 Called patient to schedule follow up for medication refills for Plavix  he was not seen in office since 02/2022 was supposed to follow up with Echo in 06/2022 per notes. Patient stated he just saw his regular cardiologist 2 weeks ago and he feels fine and he will just request his refill form them.

## 2023-09-29 ENCOUNTER — Encounter: Payer: Self-pay | Admitting: Urology

## 2023-09-29 ENCOUNTER — Other Ambulatory Visit (HOSPITAL_COMMUNITY): Payer: Self-pay | Admitting: Cardiology

## 2023-09-29 ENCOUNTER — Other Ambulatory Visit: Payer: Self-pay | Admitting: Internal Medicine

## 2023-09-29 DIAGNOSIS — F418 Other specified anxiety disorders: Secondary | ICD-10-CM

## 2023-10-05 ENCOUNTER — Telehealth: Admitting: Family

## 2023-10-05 DIAGNOSIS — J069 Acute upper respiratory infection, unspecified: Secondary | ICD-10-CM | POA: Diagnosis not present

## 2023-10-05 MED ORDER — BENZONATATE 200 MG PO CAPS: 200.0000 mg | ORAL_CAPSULE | Freq: Two times a day (BID) | ORAL | 0 refills | Status: DC | PRN

## 2023-10-05 NOTE — Patient Instructions (Signed)

## 2023-10-05 NOTE — Progress Notes (Signed)
 Virtual Visit Consent   Elihu E Fraser, you are scheduled for a virtual visit with a [facility] provider today. Just as with appointments in the office, your consent must be obtained to participate. Your consent will be active for this visit and any virtual visit you may have with one of our providers in the next 365 days. If you have a MyChart account, a copy of this consent can be sent to you electronically.  As this is a virtual visit, video technology does not allow for your provider to perform a traditional examination. This may limit your provider's ability to fully assess your condition. If your provider identifies any concerns that need to be evaluated in person or the need to arrange testing (such as labs, EKG, etc.), we will make arrangements to do so. Although advances in technology are sophisticated, we cannot ensure that it will always work on either your end or our end. If the connection with a video visit is poor, the visit may have to be switched to a telephone visit. With either a video or telephone visit, we are not always able to ensure that we have a secure connection.  By engaging in this virtual visit, you consent to the provision of healthcare and authorize for your insurance to be billed (if applicable) for the services provided during this visit. Depending on your insurance coverage, you may receive a charge related to this service.  I need to obtain your verbal consent now. Are you willing to proceed with your visit today? Justin Carroll has provided verbal consent on 10/05/2023 for a virtual visit (video or telephone). Bari Learn, FNP  Date: 10/05/2023 11:35 AM   Virtual Visit via Video Note   I, Bari Learn, connected with  Justin Carroll  ([id number], Jan 16, 1956) on 10/05/23 at 11:30 AM EDT by a video-enabled telemedicine application and verified that I am speaking with the correct person using two identifiers.  Location: Patient: Virtual Visit Location Patient:  Home Provider: Virtual Visit Location Provider: Home Office   I discussed the limitations of evaluation and management by telemedicine and the availability of in person appointments. The patient expressed understanding and agreed to proceed.    History of Present Illness: Justin Carroll is a 68 y.o. who identifies as a male who was assigned male at birth, and is being seen today for sinus congestion yesterday.   HPI: URI  This is a new problem. The problem has been gradually worsening. There has been no fever (34F). Associated symptoms include congestion, coughing, headaches, joint pain, rhinorrhea, sinus pain, sneezing and a sore throat. Pertinent negatives include no ear pain. The treatment provided mild relief.    Problems:  Patient Active Problem List   Diagnosis Date Noted   Tinea cruris 05/12/2023   Ischemic cardiomyopathy 11/11/2022   Colon cancer screening 11/11/2022   Need for influenza vaccination 11/11/2022   Type 2 diabetes mellitus with diabetic chronic kidney disease (HCC) 06/10/2022   CAD (coronary artery disease) 05/07/2022   Attention deficit 05/07/2022   Anxiety and depression 05/07/2022   Insomnia 05/07/2022   GERD (gastroesophageal reflux disease) 05/07/2022   BPH (benign prostatic hyperplasia) 05/07/2022   OSA on CPAP 05/07/2022   CKD (chronic kidney disease) stage 2, GFR 60-89 ml/min 05/07/2022   History of hypogonadism 05/07/2022   Encounter for general adult medical examination with abnormal findings 05/07/2022   Left ventricular dysfunction 04/02/2022   Depression 03/19/2022   Behavior-irritability 04/18/2021   Hyperlipemia 01/10/2021  NSTEMI (non-ST elevated myocardial infarction) (HCC) 10/17/2020   Seasonal and perennial allergic rhinitis 12/08/2019   Cognitive attention deficit 04/19/2019   Memory loss, short term 02/18/2019   Fall 02/18/2019   Other frontotemporal dementia (CODE) 02/18/2019   Eye movement abnormality 02/18/2019   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/18/2019    Allergies: No Known Allergies Medications:  Current Outpatient Medications:    benzonatate  (TESSALON ) 200 MG capsule, Take 1 capsule (200 mg total) by mouth 2 (two) times daily as needed for cough., Disp: 20 capsule, Rfl: 0   Accu-Chek Softclix Lancets lancets, 1 each by Other route in the morning, at noon, and at bedtime., Disp: 100 each, Rfl: 2   atomoxetine  (STRATTERA ) 40 MG capsule, Take 1 capsule (40 mg total) by mouth daily., Disp: 90 capsule, Rfl: 3   atorvastatin  (LIPITOR ) 80 MG tablet, TAKE ONE (1) TABLET BY MOUTH EVERY DAY, Disp: 90 tablet, Rfl: 2   Azelastine  HCl 137 MCG/SPRAY SOLN, USE 2 SPRAY(S) IN EACH NOSTRIL TWICE DAILY AS NEEDED FOR RHINITIS, Disp: 30 mL, Rfl: 2   Blood Glucose Monitoring Suppl DEVI, 1 each by Does not apply route in the morning, at noon, and at bedtime. May substitute to any manufacturer covered by patient's insurance., Disp: 1 each, Rfl: 0   buPROPion  (WELLBUTRIN  XL) 300 MG 24 hr tablet, TAKE ONE TABLET (300MG  TOTAL) BY MOUTH DAILY, Disp: 90 tablet, Rfl: 0   busPIRone  (BUSPAR ) 5 MG tablet, TAKE ONE TABLET (5MG  TOTAL) BY MOUTH TWOTIMES DAILY, Disp: 60 tablet, Rfl: 2   carvedilol  (COREG ) 6.25 MG tablet, TAKE ONE TABLET (6.25MG  TOTAL) BY MOUTH TWO TIMES DAILY WITH A MEAL, Disp: 60 tablet, Rfl: 6   Cholecalciferol  (VITAMIN D -3 PO), Take 2,000 Units by mouth daily., Disp: , Rfl:    clopidogrel  (PLAVIX ) 75 MG tablet, Take 1 tablet (75 mg total) by mouth daily., Disp: 90 tablet, Rfl: 3   Cyanocobalamin  (B-12 PO), Take by mouth., Disp: , Rfl:    dapagliflozin  propanediol (FARXIGA ) 10 MG TABS tablet, Take 1 tablet (10 mg total) by mouth daily. NEEDS FOLLOW UP APPOINTMENT FOR MORE REFILLS, Disp: 90 tablet, Rfl: 0   Evolocumab  (REPATHA  SURECLICK) 140 MG/ML SOAJ, Inject 140 mg into the skin every 14 (fourteen) days., Disp: 2 mL, Rfl: 12   glucose blood (ACCU-CHEK GUIDE TEST) test strip, USE TO TEST BLOOD SUGAR IN  THE MORNING, AT NOON, AND AT BEDTIME., Disp: 100 each, Rfl: 0   guaiFENesin (MUCINEX PO), Take 1,200 mg by mouth 2 (two) times daily., Disp: , Rfl:    levocetirizine (XYZAL ) 5 MG tablet, Take 5 mg by mouth every evening., Disp: , Rfl:    LORazepam  (ATIVAN ) 0.5 MG tablet, TAKE ONE TABLET (0.5MG  TOTAL) BY MOUTH DAILY AS NEEDED FOR ANXIETY, Disp: 30 tablet, Rfl: 0   mometasone  (NASONEX ) 50 MCG/ACT nasal spray, Place 1 spray into the nose daily., Disp: 17 g, Rfl: 5   nitroGLYCERIN  (NITROSTAT ) 0.4 MG SL tablet, Place 1 tablet (0.4 mg total) under the tongue every 5 (five) minutes as needed for chest pain. (Patient not taking: Reported on 09/15/2023), Disp: 25 tablet, Rfl: 3   Omega-3 Fatty Acids (FISH OIL PO), Take by mouth., Disp: , Rfl:    pantoprazole  (PROTONIX ) 40 MG tablet, TAKE ONE TABLET (40MG  TOTAL) BY MOUTH DAILY, Disp: 90 tablet, Rfl: 0   sacubitril -valsartan  (ENTRESTO ) 24-26 MG, Take 1 tablet by mouth 2 (two) times daily. PLEASE SCHEDULE APPOINTMENT FOR MORE REFILLS, Disp: 180 tablet, Rfl: 0  spironolactone  (ALDACTONE ) 25 MG tablet, TAKE ONE TABLET (25 MG TOTAL) BY MOUTH AT BEDTIME., Disp: 30 tablet, Rfl: 11   tadalafil  (CIALIS ) 20 MG tablet, Take 1 tablet (20 mg total) by mouth daily as needed., Disp: 30 tablet, Rfl: 5   tamsulosin  (FLOMAX ) 0.4 MG CAPS capsule, TAKE ONE (1) TABLET BY MOUTH EVERY DAY AFTER SUPPER, Disp: 90 capsule, Rfl: 3   testosterone  cypionate (DEPOTESTOSTERONE CYPIONATE) 200 MG/ML injection, INJECT 0.5ML'S (100MG  TOTAL) INTO THE MUSCLE ONCE A WEEK, Disp: 10 mL, Rfl: 3   traZODone  (DESYREL ) 100 MG tablet, TAKE ONE TABLET (100MG  TOTAL) BY MOUTH AT BEDTIME, Disp: 90 tablet, Rfl: 0   triamcinolone  cream (KENALOG ) 0.1 %, Apply 1 Application topically daily as needed (irritation/rash)., Disp: , Rfl:   Observations/Objective: Patient is well-developed, well-nourished in no acute distress.  Resting comfortably  at home.  Head is normocephalic, atraumatic.  No labored breathing.   Speech is clear and coherent with logical content.  Patient is alert and oriented at baseline.  Nasal congestion  Assessment and Plan: 1. Viral URI (Primary) - benzonatate  (TESSALON ) 200 MG capsule; Take 1 capsule (200 mg total) by mouth 2 (two) times daily as needed for cough.  Dispense: 20 capsule; Refill: 0  - Take meds as prescribed - Use a cool mist humidifier  -Use saline nose sprays frequently -Force fluids -For any cough or congestion  Use plain Mucinex- regular strength or max strength is fine -For fever or aces or pains- take tylenol  or ibuprofen. -Throat lozenges if help - Tessalon  TID  Pt will take home COVID test to rule out Follow up if symptoms worsen or do not improve   Follow Up Instructions: I discussed the assessment and treatment plan with the patient. The patient was provided an opportunity to ask questions and all were answered. The patient agreed with the plan and demonstrated an understanding of the instructions.  A copy of instructions were sent to the patient via MyChart unless otherwise noted below.     The patient was advised to call back or seek an in-person evaluation if the symptoms worsen or if the condition fails to improve as anticipated.    Bari Learn, FNP

## 2023-10-13 ENCOUNTER — Other Ambulatory Visit: Payer: Self-pay | Admitting: General Practice

## 2023-10-20 ENCOUNTER — Other Ambulatory Visit: Payer: Self-pay

## 2023-10-20 DIAGNOSIS — F418 Other specified anxiety disorders: Secondary | ICD-10-CM

## 2023-10-22 ENCOUNTER — Other Ambulatory Visit

## 2023-10-22 ENCOUNTER — Encounter: Payer: Self-pay | Admitting: Cardiovascular Disease

## 2023-10-22 ENCOUNTER — Other Ambulatory Visit (HOSPITAL_COMMUNITY): Payer: Self-pay

## 2023-10-22 DIAGNOSIS — E291 Testicular hypofunction: Secondary | ICD-10-CM

## 2023-10-22 MED ORDER — SACUBITRIL-VALSARTAN 24-26 MG PO TABS
1.0000 | ORAL_TABLET | Freq: Two times a day (BID) | ORAL | 2 refills | Status: DC
  Filled 2023-10-22: qty 180, 90d supply, fill #0

## 2023-10-27 LAB — COMPREHENSIVE METABOLIC PANEL WITH GFR
ALT: 25 IU/L (ref 0–44)
AST: 23 IU/L (ref 0–40)
Albumin: 4.5 g/dL (ref 3.9–4.9)
Alkaline Phosphatase: 95 IU/L (ref 47–123)
BUN/Creatinine Ratio: 14 (ref 10–24)
BUN: 19 mg/dL (ref 8–27)
Bilirubin Total: 0.6 mg/dL (ref 0.0–1.2)
CO2: 22 mmol/L (ref 20–29)
Calcium: 9.6 mg/dL (ref 8.6–10.2)
Chloride: 100 mmol/L (ref 96–106)
Creatinine, Ser: 1.37 mg/dL — ABNORMAL HIGH (ref 0.76–1.27)
Globulin, Total: 2.6 g/dL (ref 1.5–4.5)
Glucose: 104 mg/dL — ABNORMAL HIGH (ref 70–99)
Potassium: 5.6 mmol/L — ABNORMAL HIGH (ref 3.5–5.2)
Sodium: 139 mmol/L (ref 134–144)
Total Protein: 7.1 g/dL (ref 6.0–8.5)
eGFR: 56 mL/min/1.73 — ABNORMAL LOW (ref >59)

## 2023-10-27 LAB — CBC
Hematocrit: 55.3 % — ABNORMAL HIGH (ref 37.5–51.0)
Hemoglobin: 17.9 g/dL — ABNORMAL HIGH (ref 13.0–17.7)
MCH: 31.5 pg (ref 26.6–33.0)
MCHC: 32.4 g/dL (ref 31.5–35.7)
MCV: 97 fL (ref 79–97)
Platelets: 176 x10E3/uL (ref 150–450)
RBC: 5.69 x10E6/uL (ref 4.14–5.80)
RDW: 12.7 % (ref 11.6–15.4)
WBC: 7 x10E3/uL (ref 3.4–10.8)

## 2023-10-27 LAB — TESTOSTERONE,FREE AND TOTAL
Testosterone, Free: 7.3 pg/mL (ref 6.6–18.1)
Testosterone: 462 ng/dL (ref 264–916)

## 2023-10-29 ENCOUNTER — Ambulatory Visit: Admitting: Urology

## 2023-10-29 ENCOUNTER — Encounter: Payer: Self-pay | Admitting: Urology

## 2023-10-29 VITALS — BP 93/68 | HR 85

## 2023-10-29 DIAGNOSIS — H35372 Puckering of macula, left eye: Secondary | ICD-10-CM | POA: Diagnosis not present

## 2023-10-29 DIAGNOSIS — N401 Enlarged prostate with lower urinary tract symptoms: Secondary | ICD-10-CM

## 2023-10-29 DIAGNOSIS — H5509 Other forms of nystagmus: Secondary | ICD-10-CM | POA: Diagnosis not present

## 2023-10-29 DIAGNOSIS — E291 Testicular hypofunction: Secondary | ICD-10-CM | POA: Diagnosis not present

## 2023-10-29 DIAGNOSIS — E119 Type 2 diabetes mellitus without complications: Secondary | ICD-10-CM | POA: Diagnosis not present

## 2023-10-29 DIAGNOSIS — N400 Enlarged prostate without lower urinary tract symptoms: Secondary | ICD-10-CM

## 2023-10-29 DIAGNOSIS — R351 Nocturia: Secondary | ICD-10-CM | POA: Diagnosis not present

## 2023-10-29 DIAGNOSIS — H2513 Age-related nuclear cataract, bilateral: Secondary | ICD-10-CM | POA: Diagnosis not present

## 2023-10-29 LAB — HM DIABETES EYE EXAM

## 2023-10-29 MED ORDER — TESTOSTERONE CYPIONATE 200 MG/ML IM SOLN: 100.0000 mg | INTRAMUSCULAR | 3 refills | Status: AC

## 2023-10-29 MED ORDER — TAMSULOSIN HCL 0.4 MG PO CAPS: ORAL_CAPSULE | ORAL | 3 refills | Status: AC

## 2023-10-29 NOTE — Patient Instructions (Signed)

## 2023-10-29 NOTE — Progress Notes (Signed)
 10/29/2023 9:22 AM   Justin Carroll [birth year] [id number]  Referring provider: Melvenia Manus FORBES, MD [address] [place],  [place] [address]  No chief complaint on file.   HPI: [name] is a 68yo here for followup for hypogonadism and BPH with nocturia. Testosterone  463. Creatinine 1.37, hemoglobin 17.9. He is on IM testosterone  100mg  weekly. Creatinine 1.37. He is on farxiga . IPSS 3 QOL 1 on flomax  0.4mg  daily. Nocturia decreased to 1x. [place] stream strong. No straining to urinate   PMH: Past Medical History:  Diagnosis Date   Allergy Lifetime   Mostly pollen   Anxiety Not sure   Ativan  prn. A few times a month   CKD (chronic kidney disease) stage 2, GFR 60-89 ml/min 05/07/2022   Depression    Encounter for general adult medical examination with abnormal findings 05/07/2022   GERD (gastroesophageal reflux disease) 5 years   Myocardial infarction (HCC) 10/17/2020   Stent   Sinus infection    yearly   Sleep apnea Diagnosed 2022   CPAP   Substance abuse (HCC) 40+ years ago   Sober since 11/19/1985    Surgical History: Past Surgical History:  Procedure Laterality Date   ADENOIDECTOMY     COLONOSCOPY WITH PROPOFOL  N/A 04/23/2023   Procedure: COLONOSCOPY WITH PROPOFOL ;  Surgeon: Justin Lamar HERO, MD;  Location: [facility];  Service: Endoscopy;  Laterality: N/A;  1030AM, ASA 3   CORONARY IMAGING/OCT N/A 10/18/2020   Procedure: INTRAVASCULAR IMAGING/OCT;  Surgeon: Justin Deatrice LABOR, MD;  Location: [facility];  Service: Cardiovascular;  Laterality: N/A;   CORONARY STENT INTERVENTION N/A 10/18/2020   Procedure: CORONARY STENT INTERVENTION;  Surgeon: Justin Deatrice LABOR, MD;  Location: [facility];  Service: Cardiovascular;  Laterality: N/A;   LEFT HEART CATH AND CORONARY ANGIOGRAPHY N/A 10/18/2020   Procedure: LEFT HEART CATH AND CORONARY ANGIOGRAPHY;  Surgeon: Justin Deatrice LABOR, MD;  Location: [facility];  Service: Cardiovascular;  Laterality:  N/A;   LEFT HEART CATH AND CORONARY ANGIOGRAPHY N/A 09/30/2022   Procedure: LEFT HEART CATH AND CORONARY ANGIOGRAPHY;  Surgeon: Justin Dorn PARAS, MD;  Location: [facility];  Service: Cardiovascular;  Laterality: N/A;   NASAL SEPTUM SURGERY     SINOSCOPY     TONSILLECTOMY     age 1    Home Medications:  Allergies as of 10/29/2023   No Known Allergies      Medication List        Accurate as of October 29, 2023  9:22 AM. If you have any questions, ask your nurse or doctor.          Accu-Chek Guide Test test strip Generic drug: glucose blood USE TO TEST BLOOD SUGAR IN THE MORNING, AT NOON, AND AT BEDTIME.   Accu-Chek Softclix Lancets lancets 1 each by Other route in the morning, at noon, and at bedtime.   atomoxetine  40 MG capsule Commonly known as: STRATTERA  Take 1 capsule (40 mg total) by mouth daily.   atorvastatin  80 MG tablet Commonly known as: LIPITOR  TAKE ONE (1) TABLET BY MOUTH EVERY DAY   Azelastine  HCl 137 MCG/SPRAY Soln USE 2 SPRAY(S) IN EACH NOSTRIL TWICE DAILY AS NEEDED FOR RHINITIS   B-12 PO Take by mouth.   benzonatate  200 MG capsule Commonly known as: TESSALON  Take 1 capsule (200 mg total) by mouth 2 (two) times daily as needed for cough.   Blood Glucose Monitoring Suppl Devi 1 each by Does  not apply route in the morning, at noon, and at bedtime. May substitute to any manufacturer covered by patient's insurance.   buPROPion  300 MG 24 hr tablet Commonly known as: WELLBUTRIN  XL TAKE ONE TABLET (300MG  TOTAL) BY MOUTH DAILY   busPIRone  5 MG tablet Commonly known as: BUSPAR  TAKE ONE TABLET (5MG  TOTAL) BY MOUTH TWOTIMES DAILY   carvedilol  6.25 MG tablet Commonly known as: COREG  TAKE ONE TABLET (6.25MG  TOTAL) BY MOUTH TWO TIMES DAILY WITH A MEAL   clopidogrel  75 MG tablet Commonly known as: PLAVIX  Take 1 tablet (75 mg total) by mouth daily.   dapagliflozin  propanediol 10 MG Tabs tablet Commonly known as: Farxiga  Take 1 tablet (10 mg  total) by mouth daily. NEEDS FOLLOW UP APPOINTMENT FOR MORE REFILLS   FISH OIL PO Take by mouth.   levocetirizine 5 MG tablet Commonly known as: XYZAL  Take 5 mg by mouth every evening.   LORazepam  0.5 MG tablet Commonly known as: ATIVAN  TAKE ONE TABLET (0.5MG  TOTAL) BY MOUTH DAILY AS NEEDED FOR ANXIETY   mometasone  50 MCG/ACT nasal spray Commonly known as: NASONEX  Place 1 spray into the nose daily.   MUCINEX PO Take 1,200 mg by mouth 2 (two) times daily.   nitroGLYCERIN  0.4 MG SL tablet Commonly known as: NITROSTAT  Place 1 tablet (0.4 mg total) under the tongue every 5 (five) minutes as needed for chest pain.   pantoprazole  40 MG tablet Commonly known as: PROTONIX  TAKE ONE TABLET (40MG  TOTAL) BY MOUTH DAILY   Repatha  SureClick 140 MG/ML Soaj Generic drug: Evolocumab  Inject 140 mg into the skin every 14 (fourteen) days.   sacubitril -valsartan  24-26 MG Commonly known as: Entresto  Take 1 tablet by mouth 2 (two) times daily.   spironolactone  25 MG tablet Commonly known as: ALDACTONE  TAKE ONE TABLET (25 MG TOTAL) BY MOUTH AT BEDTIME.   tadalafil  20 MG tablet Commonly known as: CIALIS  Take 1 tablet (20 mg total) by mouth daily as needed.   tamsulosin  0.4 MG Caps capsule Commonly known as: FLOMAX  TAKE ONE (1) TABLET BY MOUTH EVERY DAY AFTER SUPPER   testosterone  cypionate 200 MG/ML injection Commonly known as: DEPOTESTOSTERONE CYPIONATE INJECT 0.5ML'S (100MG  TOTAL) INTO THE MUSCLE ONCE A WEEK   traZODone  100 MG tablet Commonly known as: DESYREL  TAKE ONE TABLET (100MG  TOTAL) BY MOUTH AT BEDTIME   triamcinolone  cream 0.1 % Commonly known as: KENALOG  Apply 1 Application topically daily as needed (irritation/rash).   VITAMIN D -3 PO Take 2,000 Units by mouth daily.        Allergies: No Known Allergies  Family History: Family History  Problem Relation Age of Onset   Stroke Mother    Alcohol abuse Father    Heart disease Father    Allergic rhinitis Brother     Alcohol abuse Brother    Anxiety disorder Brother    Depression Brother    Depression Brother    Drug abuse Brother    Stroke Brother    Dementia Brother    Heart disease Paternal Uncle    Heart disease Paternal Uncle    Colon cancer Neg Hx    Pancreatic cancer Neg Hx    Rectal cancer Neg Hx    Stomach cancer Neg Hx     Social History:  reports that he quit smoking about 10 years ago. His smoking use included cigarettes. He started smoking about 30 years ago. He has a 30 pack-year smoking history. He has never used smokeless tobacco. He reports that he does not currently use  alcohol. He reports that he does not use drugs.  ROS: All other review of systems were reviewed and are negative except what is noted above in HPI  Physical Exam: BP 93/68   Pulse 85   Constitutional:  Alert and oriented, No acute distress. HEENT: [place] AT, moist mucus membranes.  Trachea midline, no masses. Cardiovascular: No clubbing, cyanosis, or edema. Respiratory: Normal respiratory effort, no increased work of breathing. GI: Abdomen is soft, nontender, nondistended, no abdominal masses GU: No CVA tenderness.  Lymph: No cervical or inguinal lymphadenopathy. Skin: No rashes, bruises or suspicious lesions. Neurologic: Grossly intact, no focal deficits, moving all 4 extremities. Psychiatric: Normal mood and affect.  Laboratory Data: Lab Results  Component Value Date   WBC 7.0 10/22/2023   HGB 17.9 (H) 10/22/2023   HCT 55.3 (H) 10/22/2023   MCV 97 10/22/2023   PLT 176 10/22/2023    Lab Results  Component Value Date   CREATININE 1.37 (H) 10/22/2023    No results found for: PSA  Lab Results  Component Value Date   TESTOSTERONE  462 10/22/2023    Lab Results  Component Value Date   HGBA1C 6.0 (H) 05/12/2023    Urinalysis    Component Value Date/Time   COLORURINE DARK YELLOW 05/17/2020 0000   APPEARANCEUR Clear 07/31/2022 0847   LABSPEC 1.025 05/17/2020 0000   PHURINE < OR = 5.0  05/17/2020 0000   GLUCOSEU 3+ (A) 07/31/2022 0847   HGBUR NEGATIVE 05/17/2020 0000   BILIRUBINUR Negative 07/31/2022 0847   KETONESUR 1+ (A) 05/17/2020 0000   PROTEINUR Negative 07/31/2022 0847   PROTEINUR NEGATIVE 05/17/2020 0000   NITRITE Negative 07/31/2022 0847   LEUKOCYTESUR Negative 07/31/2022 0847    Lab Results  Component Value Date   LABMICR Comment 07/31/2022   BACTERIA NONE SEEN 05/17/2020    Pertinent Imaging:  No results found for this or any previous visit.  No results found for this or any previous visit.  No results found for this or any previous visit.  No results found for this or any previous visit.  No results found for this or any previous visit.  No results found for this or any previous visit.  No results found for this or any previous visit.  No results found for this or any previous visit.   Assessment & Plan:    1. Hypogonadism male (Primary) -continue 100mg  every week -followup 6 months with labs  2. Benign prostatic hyperplasia with urinary frequency -continue flomax  0.4mg  daily  3. Nocturia Continue flomax  0.4mg  daily   No follow-ups on file.  Belvie Clara, MD  [facility] [place]

## 2023-11-03 MED ORDER — DAPAGLIFLOZIN PROPANEDIOL 10 MG PO TABS: 10.0000 mg | ORAL_TABLET | Freq: Every day | ORAL | 0 refills | Status: AC

## 2023-11-11 ENCOUNTER — Ambulatory Visit (INDEPENDENT_AMBULATORY_CARE_PROVIDER_SITE_OTHER)

## 2023-11-11 VITALS — BP 105/72 | HR 71 | Ht 63.0 in | Wt 176.1 lb

## 2023-11-11 DIAGNOSIS — G4700 Insomnia, unspecified: Secondary | ICD-10-CM | POA: Diagnosis not present

## 2023-11-11 DIAGNOSIS — Z23 Encounter for immunization: Secondary | ICD-10-CM

## 2023-11-11 DIAGNOSIS — I5022 Chronic systolic (congestive) heart failure: Secondary | ICD-10-CM

## 2023-11-11 DIAGNOSIS — K219 Gastro-esophageal reflux disease without esophagitis: Secondary | ICD-10-CM

## 2023-11-11 DIAGNOSIS — F419 Anxiety disorder, unspecified: Secondary | ICD-10-CM | POA: Diagnosis not present

## 2023-11-11 DIAGNOSIS — I25118 Atherosclerotic heart disease of native coronary artery with other forms of angina pectoris: Secondary | ICD-10-CM | POA: Diagnosis not present

## 2023-11-11 DIAGNOSIS — F32A Depression, unspecified: Secondary | ICD-10-CM

## 2023-11-11 MED ORDER — NITROGLYCERIN 0.4 MG SL SUBL: 0.4000 mg | SUBLINGUAL_TABLET | SUBLINGUAL | 1 refills | Status: AC | PRN

## 2023-11-11 MED ORDER — BUSPIRONE HCL 5 MG PO TABS: 5.0000 mg | ORAL_TABLET | Freq: Two times a day (BID) | ORAL | 2 refills | Status: AC

## 2023-11-11 MED ORDER — LORAZEPAM 0.5 MG PO TABS: 0.5000 mg | ORAL_TABLET | Freq: Every day | ORAL | 2 refills | Status: AC | PRN

## 2023-11-11 MED ORDER — PANTOPRAZOLE SODIUM 40 MG PO TBEC: 40.0000 mg | DELAYED_RELEASE_TABLET | Freq: Every day | ORAL | 3 refills | Status: AC

## 2023-11-11 MED ORDER — TRAZODONE HCL 100 MG PO TABS: 100.0000 mg | ORAL_TABLET | Freq: Every day | ORAL | 3 refills | Status: AC

## 2023-11-11 NOTE — Progress Notes (Unsigned)
 Established Patient Office Visit  Subjective   Patient ID: Justin Carroll, male    DOB: 13-Jun-1955  Age: 68 y.o. MRN: [id number]  Chief Complaint  Patient presents with   Medical Management of Chronic Issues    Pt here for a follow up     HPI Discussed the use of AI scribe software for clinical note transcription with the patient, who gave verbal consent to proceed.  History of Present Illness   Justin Carroll is a 68 year old male with coronary artery disease and diabetes who presents for a routine follow-up and medication refill.  Cardiac symptoms and coronary artery disease - History of coronary artery disease with prior myocardial infarction and stent placement over three years ago - Continues to take cardiac medications - No chest pain over the past year - Ejection fraction measured at 45-50% on cardiac MRI last year - No recent use of nitroglycerin , but requests refill as current supply is over a year old - Occasional morning tiredness, attributed to limited cardiovascular exercise due to caregiving responsibilities - Difficulty maintaining a regular exercise routine  Glycemic control and hypoglycemia - History of type 2 diabetes managed without medication - Experienced hypoglycemic episode on December 23rd of the previous year while running errands, requiring EMS intervention and sucrose packs - Currently monitors blood glucose, with typical morning readings between 101-104 mg/dL - Last hemoglobin J8r was 6.0, with a previous high of 6.7  Allergic rhinitis and upper airway symptoms - History of seasonal allergies, particularly to ragweed - Increased mucus production during summer and fall - Takes Mucinex 1200 mg in the morning and at night, which has improved symptoms - Improved CPAP use and sleep quality with better mucus control - Improved morning energy levels with current regimen  Medication management - Manages medications weekly - Currently running low  on Buspar  and trazodone  and may require refills soon      Patient Active Problem List   Diagnosis Date Noted   Chronic systolic heart failure (HCC) 11/13/2023   Tinea cruris 05/12/2023   Ischemic cardiomyopathy 11/11/2022   Colon cancer screening 11/11/2022   Need for influenza vaccination 11/11/2022   Type 2 diabetes mellitus with diabetic chronic kidney disease (HCC) 06/10/2022   CAD (coronary artery disease) 05/07/2022   Attention deficit 05/07/2022   Anxiety and depression 05/07/2022   Insomnia 05/07/2022   GERD (gastroesophageal reflux disease) 05/07/2022   BPH (benign prostatic hyperplasia) 05/07/2022   OSA on CPAP 05/07/2022   CKD (chronic kidney disease) stage 2, GFR 60-89 ml/min 05/07/2022   History of hypogonadism 05/07/2022   Encounter for general adult medical examination with abnormal findings 05/07/2022   Left ventricular dysfunction 04/02/2022   Depression 03/19/2022   Behavior-irritability 04/18/2021   Hyperlipemia 01/10/2021   NSTEMI (non-ST elevated myocardial infarction) (HCC) 10/17/2020   Seasonal and perennial allergic rhinitis 12/08/2019   Cognitive attention deficit 04/19/2019   Memory loss, short term 02/18/2019   Fall 02/18/2019   Eye movement abnormality 02/18/2019    ROS    Objective:     BP 105/72   Pulse 71   Ht 5' 3 (1.6 m)   Wt 176 lb 1.3 oz (79.9 kg)   SpO2 93%   BMI 31.19 kg/m  BP Readings from Last 3 Encounters:  11/11/23 105/72  10/29/23 93/68  09/15/23 90/64   Wt Readings from Last 3 Encounters:  11/11/23 176 lb 1.3 oz (79.9 kg)  09/15/23 176 lb 9.6 oz (80.1 kg)  06/05/23 181 lb (82.1 kg)      Physical Exam Vitals and nursing note reviewed.  Constitutional:      Appearance: Normal appearance.  HENT:     Head: Normocephalic.     Right Ear: Tympanic membrane, ear canal and external ear normal.     Left Ear: Tympanic membrane, ear canal and external ear normal.     Nose: Nose normal.     Mouth/Throat:     Mouth:  Mucous membranes are moist.     Pharynx: Oropharynx is clear.  Eyes:     Extraocular Movements: Extraocular movements intact.     Pupils: Pupils are equal, round, and reactive to light.  Cardiovascular:     Rate and Rhythm: Normal rate and regular rhythm.  Pulmonary:     Effort: Pulmonary effort is normal.     Breath sounds: Normal breath sounds.  Musculoskeletal:     Cervical back: Normal range of motion and neck supple.  Skin:    General: Skin is warm and dry.  Neurological:     Mental Status: He is alert and oriented to person, place, and time.  Psychiatric:        Mood and Affect: Mood normal.        Thought Content: Thought content normal.     No results found for any visits on 11/11/23.  Last CBC Lab Results  Component Value Date   WBC 7.0 10/22/2023   HGB 17.9 (H) 10/22/2023   HCT 55.3 (H) 10/22/2023   MCV 97 10/22/2023   MCH 31.5 10/22/2023   RDW 12.7 10/22/2023   PLT 176 10/22/2023   Last metabolic panel Lab Results  Component Value Date   GLUCOSE 104 (H) 10/22/2023   NA 139 10/22/2023   K 5.6 (H) 10/22/2023   CL 100 10/22/2023   CO2 22 10/22/2023   BUN 19 10/22/2023   CREATININE 1.37 (H) 10/22/2023   EGFR 56 (L) 10/22/2023   CALCIUM  9.6 10/22/2023   PROT 7.1 10/22/2023   ALBUMIN 4.5 10/22/2023   LABGLOB 2.6 10/22/2023   AGRATIO 1.8 05/07/2022   BILITOT 0.6 10/22/2023   ALKPHOS 95 10/22/2023   AST 23 10/22/2023   ALT 25 10/22/2023   ANIONGAP 9 04/18/2023   Last lipids Lab Results  Component Value Date   CHOL 122 09/12/2023   HDL 57 09/12/2023   LDLCALC 46 09/12/2023   TRIG 103 09/12/2023   CHOLHDL 2.1 09/12/2023   Last hemoglobin A1c Lab Results  Component Value Date   HGBA1C 6.0 (H) 05/12/2023   Last thyroid  functions Lab Results  Component Value Date   TSH 0.921 05/12/2023   Last vitamin D  Lab Results  Component Value Date   VD25OH 39.9 05/12/2023   Last vitamin B12 and Folate Lab Results  Component Value Date   VITAMINB12  252 05/12/2023   FOLATE 5.7 05/12/2023      The ASCVD Risk score (Arnett DK, et al., 2019) failed to calculate for the following reasons:   Risk score cannot be calculated because patient has a medical history suggesting prior/existing ASCVD    Assessment & Plan:   Problem List Items Addressed This Visit       Cardiovascular and Mediastinum   CAD (coronary artery disease)   Atherosclerotic heart disease with stent placed over three years ago. Angina well-managed, no recent nitroglycerin  use. Ejection fraction 45-50%, goal to increase to 60% or above. - Refill nitroglycerin  prescription. - Encourage regular exercise to improve ejection fraction. Managed by  cardiology, Dr. Court      Relevant Medications   nitroGLYCERIN  (NITROSTAT ) 0.4 MG SL tablet   Chronic systolic heart failure (HCC)   Atherosclerotic heart disease with stent placed over three years ago. Angina well-managed, no recent nitroglycerin  use. Ejection fraction 45-50%, goal to increase to 60% or above. - Refill nitroglycerin  prescription. - Encourage regular exercise to improve ejection fraction. Managed by cardiology, Dr. Court      Relevant Medications   nitroGLYCERIN  (NITROSTAT ) 0.4 MG SL tablet     Digestive   GERD (gastroesophageal reflux disease)   Symptoms are currently well-controlled with Protonix  40 mg daily. -No medication changes today      Relevant Medications   pantoprazole  (PROTONIX ) 40 MG tablet     Other   Anxiety and depression - Primary   Currently well-controlled with BuSpar , Ativan  and trazodone .  No medication changes made today.  Refills provided.      Relevant Medications   busPIRone  (BUSPAR ) 5 MG tablet   LORazepam  (ATIVAN ) 0.5 MG tablet   traZODone  (DESYREL ) 100 MG tablet   Insomnia   Currently prescribed trazodone  100 mg nightly.  This has been refilled today.      Relevant Medications   traZODone  (DESYREL ) 100 MG tablet   Other Visit Diagnoses       Immunization due        Relevant Orders   Flu vaccine HIGH DOSE PF(Fluzone Trivalent) (Completed)      Return in about 6 months (around 05/11/2024) for chronic follow-up with PCP.    Leita Longs, FNP

## 2023-11-13 DIAGNOSIS — I5022 Chronic systolic (congestive) heart failure: Secondary | ICD-10-CM | POA: Insufficient documentation

## 2023-11-13 DIAGNOSIS — N1832 Chronic kidney disease, stage 3b: Secondary | ICD-10-CM | POA: Insufficient documentation

## 2023-11-13 NOTE — Assessment & Plan Note (Signed)
Currently prescribed trazodone 100 mg nightly.  This has been refilled today.

## 2023-11-13 NOTE — Assessment & Plan Note (Addendum)
 Atherosclerotic heart disease with stent placed over three years ago. Angina well-managed, no recent nitroglycerin  use. Ejection fraction 45-50%, goal to increase to 60% or above. - Refill nitroglycerin  prescription. - Encourage regular exercise to improve ejection fraction. Managed by cardiology, Dr. Court

## 2023-11-13 NOTE — Assessment & Plan Note (Signed)
 Currently well-controlled with BuSpar , Ativan  and trazodone .  No medication changes made today.  Refills provided.

## 2023-11-13 NOTE — Assessment & Plan Note (Signed)
Symptoms are currently well-controlled with Protonix 40 mg daily. -No medication changes today 

## 2023-11-24 ENCOUNTER — Other Ambulatory Visit (HOSPITAL_COMMUNITY): Payer: Self-pay | Admitting: Adult Health

## 2023-11-26 NOTE — Telephone Encounter (Signed)
 Pt is requesting Dr. Court to refill his medication spironolactone , which was refill by the [facility]. Would Dr. Court like to start refill this medication? Please address

## 2023-12-01 ENCOUNTER — Ambulatory Visit: Admitting: Family Medicine

## 2023-12-09 ENCOUNTER — Other Ambulatory Visit: Payer: Self-pay

## 2023-12-11 MED ORDER — ATORVASTATIN CALCIUM 80 MG PO TABS: 80.0000 mg | ORAL_TABLET | Freq: Every day | ORAL | 2 refills | Status: AC

## 2023-12-22 ENCOUNTER — Other Ambulatory Visit (HOSPITAL_COMMUNITY): Payer: Self-pay

## 2023-12-23 ENCOUNTER — Ambulatory Visit: Payer: Medicare Other

## 2023-12-23 VITALS — BP 97/78 | Ht 66.0 in | Wt 165.0 lb

## 2023-12-23 DIAGNOSIS — Z0000 Encounter for general adult medical examination without abnormal findings: Secondary | ICD-10-CM

## 2023-12-23 DIAGNOSIS — Z0001 Encounter for general adult medical examination with abnormal findings: Secondary | ICD-10-CM

## 2023-12-23 DIAGNOSIS — F17210 Nicotine dependence, cigarettes, uncomplicated: Secondary | ICD-10-CM

## 2023-12-23 NOTE — Patient Instructions (Addendum)
 Mr. Justin Carroll,  Thank you for taking the time for your Medicare Wellness Visit. I appreciate your continued commitment to your health goals. Please review the care plan we discussed, and feel free to reach out if I can assist you further.  Please note that Annual Wellness Visits do not include a physical exam. Some assessments may be limited, especially if the visit was conducted virtually. If needed, we may recommend an in-person follow-up with your provider.  Ongoing Care Seeing your primary care provider every 3 to 6 months helps us  monitor your health and provide consistent, personalized care.   Medicare Annual Wellness Visit: December 24, 2024 at 8:40 am in person  Referrals If a referral was made during today's visit and you haven't received any updates within two weeks, please contact the referred provider directly to check on the status.  Lung Cancer Screening-[place] Office [address]-First Floor Medical Building directly across from [facility] Phone Number:[phone]    Recommended Screenings:  Health Maintenance  Topic Date Due   Zoster (Shingles) Vaccine (1 of 2) Never done   COVID-19 Vaccine (3 - Moderna risk series) 06/25/2019   Screening for Lung Cancer  09/30/2022   Complete foot exam   06/10/2023   Yearly kidney health urinalysis for diabetes  06/26/2023   Hemoglobin A1C  11/11/2023   Medicare Annual Wellness Visit  12/20/2023   Yearly kidney function blood test for diabetes  10/21/2024   Eye exam for diabetics  10/28/2024   DTaP/Tdap/Td vaccine (2 - Td or Tdap) 12/15/2024   Colon Cancer Screening  04/22/2028   Pneumococcal Vaccine for age over 58  Completed   Flu Shot  Completed   Hepatitis C Screening  Completed   Meningitis B Vaccine  Aged Out       12/19/2023   12:23 PM  Advanced Directives  Does Patient Have a Medical Advance Directive? Yes  Type of Estate Agent of [place];Living will  Copy of Healthcare Power of  Attorney in Chart? No - copy requested    Vision: Annual vision screenings are recommended for early detection of glaucoma, cataracts, and diabetic retinopathy. These exams can also reveal signs of chronic conditions such as diabetes and high blood pressure.  Dental: Annual dental screenings help detect early signs of oral cancer, gum disease, and other conditions linked to overall health, including heart disease and diabetes.  Please see the attached documents for additional preventive care recommendations.

## 2023-12-23 NOTE — Progress Notes (Signed)
 Chief Complaint  Patient presents with   Medicare Wellness     Subjective:   Justin Carroll is a 68 y.o. male who presents for a Medicare Annual Wellness Visit.  Allergies (verified) Patient has no known allergies.   History: Past Medical History:  Diagnosis Date   Allergy Lifetime   Mostly pollen   Anxiety Not sure   Ativan  prn. A few times a month   CKD (chronic kidney disease) stage 2, GFR 60-89 ml/min 05/07/2022   Depression    Encounter for general adult medical examination with abnormal findings 05/07/2022   GERD (gastroesophageal reflux disease) 5 years   Myocardial infarction (HCC) 10/17/2020   Stent   Sinus infection    yearly   Sleep apnea Diagnosed 2022   CPAP   Substance abuse (HCC) 40+ years ago   Sober since 11/19/1985   Past Surgical History:  Procedure Laterality Date   ADENOIDECTOMY     COLONOSCOPY WITH PROPOFOL  N/A 04/23/2023   Procedure: COLONOSCOPY WITH PROPOFOL ;  Surgeon: Shaaron Lamar HERO, MD;  Location: [facility];  Service: Endoscopy;  Laterality: N/A;  1030AM, ASA 3   CORONARY IMAGING/OCT N/A 10/18/2020   Procedure: INTRAVASCULAR IMAGING/OCT;  Surgeon: Darron Deatrice LABOR, MD;  Location: [facility];  Service: Cardiovascular;  Laterality: N/A;   CORONARY STENT INTERVENTION N/A 10/18/2020   Procedure: CORONARY STENT INTERVENTION;  Surgeon: Darron Deatrice LABOR, MD;  Location: [facility];  Service: Cardiovascular;  Laterality: N/A;   LEFT HEART CATH AND CORONARY ANGIOGRAPHY N/A 10/18/2020   Procedure: LEFT HEART CATH AND CORONARY ANGIOGRAPHY;  Surgeon: Darron Deatrice LABOR, MD;  Location: [facility];  Service: Cardiovascular;  Laterality: N/A;   LEFT HEART CATH AND CORONARY ANGIOGRAPHY N/A 09/30/2022   Procedure: LEFT HEART CATH AND CORONARY ANGIOGRAPHY;  Surgeon: Court Dorn PARAS, MD;  Location: [facility];  Service: Cardiovascular;  Laterality: N/A;   NASAL SEPTUM SURGERY     SINOSCOPY     TONSILLECTOMY     age 47    Family History  Problem Relation Age of Onset   Stroke Mother    Alcohol abuse Father    Heart disease Father    Allergic rhinitis Brother    Alcohol abuse Brother    Anxiety disorder Brother    Depression Brother    Depression Brother    Drug abuse Brother    Stroke Brother    Dementia Brother    Anxiety disorder Brother    Heart disease Paternal Uncle    Heart disease Paternal Uncle    Colon cancer Neg Hx    Pancreatic cancer Neg Hx    Rectal cancer Neg Hx    Stomach cancer Neg Hx    Social History   Occupational History   Occupation: Clinical Research Associate    Comment: self employeed  Tobacco Use   Smoking status: Former    Current packs/day: 0.00    Average packs/day: 1.5 packs/day for 20.0 years (30.0 ttl pk-yrs)    Types: Cigarettes    Start date: 69    Quit date: 02/04/2013    Years since quitting: 10.8   Smokeless tobacco: Never  Vaping Use   Vaping status: Never Used  Substance and Sexual Activity   Alcohol use: Not Currently   Drug use: No   Sexual activity: Yes    Birth control/protection: Surgical   Tobacco Counseling Counseling given: Yes  SDOH Screenings   Food Insecurity: No Food Insecurity (12/19/2023)  Housing: Low Risk  (12/19/2023)  Recent Concern: Housing - High Risk (11/10/2023)  Transportation Needs: No Transportation Needs (12/19/2023)  Utilities: Not At Risk (12/23/2023)  Alcohol Screen: Low Risk  (12/20/2022)  Depression (PHQ2-9): Low Risk  (12/23/2023)  Recent Concern: Depression (PHQ2-9) - Medium Risk (11/11/2023)  Financial Resource Strain: Low Risk  (12/19/2023)  Physical Activity: Insufficiently Active (12/19/2023)  Social Connections: Socially Integrated (12/19/2023)  Stress: No Stress Concern Present (12/19/2023)  Tobacco Use: Medium Risk (12/23/2023)  Health Literacy: Adequate Health Literacy (12/23/2023)   See flowsheets for full screening details  Depression Screen PHQ 2 & 9 Depression Scale- Over the past 2 weeks, how often have  you been bothered by any of the following problems? Little interest or pleasure in doing things: 0 Feeling down, depressed, or hopeless (PHQ Adolescent also includes...irritable): 0 PHQ-2 Total Score: 0 Trouble falling or staying asleep, or sleeping too much: 0 Feeling tired or having little energy: 0 Poor appetite or overeating (PHQ Adolescent also includes...weight loss): 0 Feeling bad about yourself - or that you are a failure or have let yourself or your family down: 0 Trouble concentrating on things, such as reading the newspaper or watching television (PHQ Adolescent also includes...like school work): 0 Moving or speaking so slowly that other people could have noticed. Or the opposite - being so fidgety or restless that you have been moving around a lot more than usual: 0 Thoughts that you would be better off dead, or of hurting yourself in some way: 0 PHQ-9 Total Score: 0 If you checked off any problems, how difficult have these problems made it for you to do your work, take care of things at home, or get along with other people?: Not difficult at all  Depression Treatment Depression Interventions/Treatment : Patient refuses Treatment     Goals Addressed             This Visit's Progress    DIET - EAT MORE FRUITS AND VEGETABLES   On track      Visit info / Clinical Intake: Medicare Wellness Visit Type:: Subsequent Annual Wellness Visit Persons participating in visit:: patient Medicare Wellness Visit Mode:: Telephone If telephone:: video declined Because this visit was a virtual/telehealth visit:: pt reported vitals If Telephone or Video please confirm:: I connected with the patient using audio enabled telemedicine application and verified that I am speaking with the correct person using two identifiers; I discussed the limitations of evaluation and management by telemedicine; The patient expressed understanding and agreed to proceed Patient Location:: home Provider  Location:: [facility] Information given by:: patient Interpreter Needed?: No Pre-visit prep was completed: yes AWV questionnaire completed by patient prior to visit?: yes Date:: 12/19/23 Living arrangements:: lives with spouse/significant other Patient's Overall Health Status Rating: good Typical amount of pain: none Does pain affect daily life?: no Are you currently prescribed opioids?: no  Dietary Habits and Nutritional Risks How many meals a day?: 3 Eats fruit and vegetables daily?: yes Most meals are obtained by: preparing own meals In the last 2 weeks, have you had any of the following?: none Diabetic:: (!) yes Any non-healing wounds?: no How often do you check your BS?: 2 Would you like to be referred to a Nutritionist or for Diabetic Management? : no  Functional Status Activities of Daily Living (to include ambulation/medication): (Patient-Rptd) Independent Ambulation: (Patient-Rptd) Independent Medication Administration: Independent Home Management: (Patient-Rptd) Independent Manage your own finances?: yes Primary transportation is: driving Concerns about vision?: no *vision screening is required for  WTM* Concerns about hearing?: no  Fall Screening Falls in the past year?: (Patient-Rptd) 0 Number of falls in past year: (Patient-Rptd) 0 Was there an injury with Fall?: (Patient-Rptd) 0 Fall Risk Category Calculator: (Patient-Rptd) 0 Patient Fall Risk Level: (Patient-Rptd) Low Fall Risk  Fall Risk Patient at Risk for Falls Due to: No Fall Risks Fall risk Follow up: Falls evaluation completed; Education provided; Falls prevention discussed  Home and Transportation Safety: All rugs have non-skid backing?: N/A, no rugs All stairs or steps have railings?: yes Grab bars in the bathtub or shower?: yes Have non-skid surface in bathtub or shower?: yes Good home lighting?: yes Regular seat belt use?: yes Hospital stays in the last year:: no  Cognitive Assessment Difficulty  concentrating, remembering, or making decisions? : no Will 6CIT or Mini Cog be Completed: no 6CIT or Mini Cog Declined: patient alert, oriented, able to answer questions appropriately and recall recent events  Advance Directives (For Healthcare) Does Patient Have a Medical Advance Directive?: Yes Type of Advance Directive: Healthcare Power of [place]; Living will Copy of Healthcare Power of Attorney in Chart?: No - copy requested Copy of Living Will in Chart?: No - copy requested Would patient like information on creating a medical advance directive?: No - Patient declined  Reviewed/Updated  Reviewed/Updated: Reviewed All (Medical, Surgical, Family, Medications, Allergies, Care Teams, Patient Goals)        Objective:    Today's Vitals   12/23/23 1130  BP: 97/78  Weight: 165 lb (74.8 kg)  Height: 5' 6 (1.676 m)   Body mass index is 26.63 kg/m.  Current Medications (verified) Outpatient Encounter Medications as of 12/23/2023  Medication Sig   Accu-Chek Softclix Lancets lancets 1 each by Other route in the morning, at noon, and at bedtime.   atomoxetine  (STRATTERA ) 40 MG capsule Take 1 capsule (40 mg total) by mouth daily.   atorvastatin  (LIPITOR ) 80 MG tablet Take 1 tablet (80 mg total) by mouth daily.   Azelastine  HCl 137 MCG/SPRAY SOLN USE 2 SPRAY(S) IN EACH NOSTRIL TWICE DAILY AS NEEDED FOR RHINITIS   benzonatate  (TESSALON ) 200 MG capsule Take 1 capsule (200 mg total) by mouth 2 (two) times daily as needed for cough.   Blood Glucose Monitoring Suppl DEVI 1 each by Does not apply route in the morning, at noon, and at bedtime. May substitute to any manufacturer covered by patient's insurance.   buPROPion  (WELLBUTRIN  XL) 300 MG 24 hr tablet TAKE ONE TABLET (300MG  TOTAL) BY MOUTH DAILY   busPIRone  (BUSPAR ) 5 MG tablet Take 1 tablet (5 mg total) by mouth 2 (two) times daily.   carvedilol  (COREG ) 6.25 MG tablet TAKE ONE TABLET (6.25MG  TOTAL) BY MOUTH TWO TIMES DAILY WITH A MEAL    Cholecalciferol  (VITAMIN D -3 PO) Take 2,000 Units by mouth daily.   clopidogrel  (PLAVIX ) 75 MG tablet Take 1 tablet (75 mg total) by mouth daily.   Cyanocobalamin  (B-12 PO) Take by mouth.   dapagliflozin  propanediol (FARXIGA ) 10 MG TABS tablet Take 1 tablet (10 mg total) by mouth daily.   Evolocumab  (REPATHA  SURECLICK) 140 MG/ML SOAJ Inject 140 mg into the skin every 14 (fourteen) days.   glucose blood (ACCU-CHEK GUIDE TEST) test strip USE TO TEST BLOOD SUGAR IN THE MORNING, AT NOON, AND AT BEDTIME.   guaiFENesin (MUCINEX PO) Take 1,200 mg by mouth 2 (two) times daily.   levocetirizine (XYZAL ) 5 MG tablet Take 5 mg by mouth every evening.   LORazepam  (ATIVAN ) 0.5 MG tablet Take 1 tablet (  0.5 mg total) by mouth daily as needed for anxiety.   mometasone  (NASONEX ) 50 MCG/ACT nasal spray Place 1 spray into the nose daily.   nitroGLYCERIN  (NITROSTAT ) 0.4 MG SL tablet Place 1 tablet (0.4 mg total) under the tongue every 5 (five) minutes as needed for chest pain.   Omega-3 Fatty Acids (FISH OIL PO) Take by mouth.   pantoprazole  (PROTONIX ) 40 MG tablet Take 1 tablet (40 mg total) by mouth daily.   sacubitril -valsartan  (ENTRESTO ) 24-26 MG Take 1 tablet by mouth 2 (two) times daily.   spironolactone  (ALDACTONE ) 25 MG tablet TAKE ONE TABLET (25 MG TOTAL) BY MOUTH AT BEDTIME.   tadalafil  (CIALIS ) 20 MG tablet Take 1 tablet (20 mg total) by mouth daily as needed.   tamsulosin  (FLOMAX ) 0.4 MG CAPS capsule TAKE ONE (1) TABLET BY MOUTH EVERY DAY AFTER SUPPER   testosterone  cypionate (DEPOTESTOSTERONE CYPIONATE) 200 MG/ML injection Inject 0.5 mLs (100 mg total) into the muscle every 7 (seven) days.   traZODone  (DESYREL ) 100 MG tablet Take 1 tablet (100 mg total) by mouth at bedtime.   triamcinolone  cream (KENALOG ) 0.1 % Apply 1 Application topically daily as needed (irritation/rash).   No facility-administered encounter medications on file as of 12/23/2023.   Hearing/Vision screen Hearing Screening -  Comments:: Patient denies any hearing difficulties.   Vision Screening - Comments:: Wears rx glasses - up to date with routine eye exams with  [facility] Immunizations and Health Maintenance Health Maintenance  Topic Date Due   Zoster Vaccines- Shingrix (1 of 2) Never done   COVID-19 Vaccine (3 - Moderna risk series) 06/25/2019   Lung Cancer Screening  09/30/2022   FOOT EXAM  06/10/2023   Diabetic kidney evaluation - Urine ACR  06/26/2023   HEMOGLOBIN A1C  11/11/2023   Medicare Annual Wellness (AWV)  12/20/2023   Diabetic kidney evaluation - eGFR measurement  10/21/2024   OPHTHALMOLOGY EXAM  10/28/2024   DTaP/Tdap/Td (2 - Td or Tdap) 12/15/2024   Colonoscopy  04/22/2028   Pneumococcal Vaccine: 50+ Years  Completed   Influenza Vaccine  Completed   Hepatitis C Screening  Completed   Meningococcal B Vaccine  Aged Out        Assessment/Plan:  This is a routine wellness examination for Aws.  Patient Care Team: Bevely Doffing, FNP as PCP - General (Family Medicine) Braulio Hough, MD as Referring Physician (Internal Medicine) McKenzie, Belvie CROME, MD as Consulting Physician (Urology) Shlomo Wilbert SAUNDERS, MD as Consulting Physician (Cardiology) Court Dorn PARAS, MD as Consulting Physician (Cardiology) [facility] (Ophthalmology)  I have personally reviewed and noted the following in the patient's chart:   Medical and social history Use of alcohol, tobacco or illicit drugs  Current medications and supplements including opioid prescriptions. Functional ability and status Nutritional status Physical activity Advanced directives List of other physicians Hospitalizations, surgeries, and ER visits in previous 12 months Vitals Screenings to include cognitive, depression, and falls Referrals and appointments  No orders of the defined types were placed in this encounter.  In addition, I have reviewed and discussed with patient certain preventive  protocols, quality metrics, and best practice recommendations. A written personalized care plan for preventive services as well as general preventive health recommendations were provided to patient.   Ramar Nobrega, CMA   12/23/2023   No follow-ups on file.  After Visit Summary: (MyChart) Due to this being a telephonic visit, the after visit summary with patients personalized plan was offered to patient via MyChart  Nurse Notes: due for diabetic labs.

## 2023-12-31 ENCOUNTER — Other Ambulatory Visit: Payer: Self-pay | Admitting: Urology

## 2024-01-03 ENCOUNTER — Other Ambulatory Visit: Payer: Self-pay

## 2024-01-03 ENCOUNTER — Encounter: Payer: Self-pay | Admitting: Cardiovascular Disease

## 2024-01-03 DIAGNOSIS — F418 Other specified anxiety disorders: Secondary | ICD-10-CM

## 2024-01-05 MED ORDER — SACUBITRIL-VALSARTAN 24-26 MG PO TABS: 1.0000 | ORAL_TABLET | Freq: Two times a day (BID) | ORAL | 2 refills | Status: AC

## 2024-01-08 ENCOUNTER — Ambulatory Visit
Admission: RE | Admit: 2024-01-08 | Discharge: 2024-01-08 | Disposition: A | Discharge status: Home / Self Care | Attending: Nurse Practitioner

## 2024-01-08 VITALS — HR 86 | Temp 98.2°F | Resp 18

## 2024-01-08 DIAGNOSIS — J069 Acute upper respiratory infection, unspecified: Secondary | ICD-10-CM | POA: Diagnosis not present

## 2024-01-08 MED ORDER — BENZONATATE 200 MG PO CAPS: 200.0000 mg | ORAL_CAPSULE | Freq: Two times a day (BID) | ORAL | 0 refills | Status: AC | PRN

## 2024-01-08 NOTE — Discharge Instructions (Signed)
 You have a viral upper respiratory infection.  Symptoms should improve over the next week to 10 days.  If you develop chest pain or shortness of breath, go to the emergency room.  Some things that can make you feel better are: - Increased rest - Increasing fluid with water/sugar free electrolytes - Acetaminophen  as needed for fever/pain - Salt water gargling, chloraseptic spray and throat lozenges for sore throat - OTC guaifenesin (Mucinex) 1200 mg twice daily for congestion - Saline sinus flushes or a neti pot - Humidifying the air

## 2024-01-08 NOTE — ED Provider Notes (Signed)
 [facility]    CSN: [id number] Arrival date & time: 01/08/24  9072      History   Chief Complaint Chief Complaint  Patient presents with   Cough    Entered by patient    HPI Justin Carroll is a 68 y.o. male.   Patient presents today 3 day history of body aches, congested nonproductive cough, sneezing, pain in my lungs when he coughs, stuffy nose, intermittent sore throat, headache, decreased appetite, and fatigue.  Patient denies fever, chills, shortness of breath, ear pain, abdominal pain, nausea/vomiting, diarrhea.  Reports he has been exposed to daughter who has viral bronchitis and grandson who recently tested positive for RSV. Has been taking Delsym, Dayquil, and Nyquil for symptoms without much improvement.     Past Medical History:  Diagnosis Date   Allergy Lifetime   Mostly pollen   Anxiety Not sure   Ativan  prn. A few times a month   CKD (chronic kidney disease) stage 2, GFR 60-89 ml/min 05/07/2022   Depression    Encounter for general adult medical examination with abnormal findings 05/07/2022   GERD (gastroesophageal reflux disease) 5 years   Myocardial infarction (HCC) 10/17/2020   Stent   Sinus infection    yearly   Sleep apnea Diagnosed 2022   CPAP   Substance abuse (HCC) 40+ years ago   Sober since 11/19/1985    Patient Active Problem List   Diagnosis Date Noted   Chronic systolic heart failure (HCC) 11/13/2023   Tinea cruris 05/12/2023   Ischemic cardiomyopathy 11/11/2022   Colon cancer screening 11/11/2022   Need for influenza vaccination 11/11/2022   Type 2 diabetes mellitus with diabetic chronic kidney disease (HCC) 06/10/2022   CAD (coronary artery disease) 05/07/2022   Attention deficit 05/07/2022   Anxiety and depression 05/07/2022   Insomnia 05/07/2022   GERD (gastroesophageal reflux disease) 05/07/2022   BPH (benign prostatic hyperplasia) 05/07/2022   OSA on CPAP 05/07/2022   CKD (chronic kidney disease) stage 2, GFR  60-89 ml/min 05/07/2022   History of hypogonadism 05/07/2022   Encounter for general adult medical examination with abnormal findings 05/07/2022   Left ventricular dysfunction 04/02/2022   Depression 03/19/2022   Behavior-irritability 04/18/2021   Hyperlipemia 01/10/2021   NSTEMI (non-ST elevated myocardial infarction) (HCC) 10/17/2020   Seasonal and perennial allergic rhinitis 12/08/2019   Cognitive attention deficit 04/19/2019   Memory loss, short term 02/18/2019   Fall 02/18/2019   Eye movement abnormality 02/18/2019    Past Surgical History:  Procedure Laterality Date   ADENOIDECTOMY     COLONOSCOPY WITH PROPOFOL  N/A 04/23/2023   Procedure: COLONOSCOPY WITH PROPOFOL ;  Surgeon: Shaaron Lamar HERO, MD;  Location: [facility];  Service: Endoscopy;  Laterality: N/A;  1030AM, ASA 3   CORONARY IMAGING/OCT N/A 10/18/2020   Procedure: INTRAVASCULAR IMAGING/OCT;  Surgeon: Darron Deatrice LABOR, MD;  Location: [facility];  Service: Cardiovascular;  Laterality: N/A;   CORONARY STENT INTERVENTION N/A 10/18/2020   Procedure: CORONARY STENT INTERVENTION;  Surgeon: Darron Deatrice LABOR, MD;  Location: [facility];  Service: Cardiovascular;  Laterality: N/A;   LEFT HEART CATH AND CORONARY ANGIOGRAPHY N/A 10/18/2020   Procedure: LEFT HEART CATH AND CORONARY ANGIOGRAPHY;  Surgeon: Darron Deatrice LABOR, MD;  Location: [facility];  Service: Cardiovascular;  Laterality: N/A;   LEFT HEART CATH AND CORONARY ANGIOGRAPHY N/A 09/30/2022   Procedure: LEFT HEART CATH AND CORONARY ANGIOGRAPHY;  Surgeon: Court Dorn PARAS, MD;  [facility];  Service: Cardiovascular;  Laterality: N/A;   NASAL SEPTUM SURGERY     SINOSCOPY     TONSILLECTOMY     age 1       Home Medications    Prior to Admission medications   Medication Sig Start Date End Date Taking? Authorizing Provider  Accu-Chek Softclix Lancets lancets 1 each by Other route in the morning, at noon, and at bedtime. 07/07/23    Melvenia Manus BRAVO, MD  atomoxetine  (STRATTERA ) 40 MG capsule Take 1 capsule (40 mg total) by mouth daily. 06/05/23   Lomax, Amy, NP  atorvastatin  (LIPITOR ) 80 MG tablet Take 1 tablet (80 mg total) by mouth daily. 12/11/23   Court Dorn PARAS, MD  Azelastine  HCl 137 MCG/SPRAY SOLN USE 2 SPRAY(S) IN EACH NOSTRIL TWICE DAILY AS NEEDED FOR RHINITIS 09/21/20   Iva Marty Saltness, MD  benzonatate  (TESSALON ) 200 MG capsule Take 1 capsule (200 mg total) by mouth 2 (two) times daily as needed for cough. Do not take with alcohol or while driving or operating heavy machinery.  May cause drowsiness. 01/08/24   Chandra Harlene LABOR, NP  Blood Glucose Monitoring Suppl DEVI 1 each by Does not apply route in the morning, at noon, and at bedtime. May substitute to any manufacturer covered by patient's insurance. 01/30/23   Melvenia Manus BRAVO, MD  buPROPion  (WELLBUTRIN  XL) 300 MG 24 hr tablet TAKE ONE TABLET (300MG  TOTAL) BY MOUTH DAILY 01/05/24   Bevely Doffing, FNP  busPIRone  (BUSPAR ) 5 MG tablet Take 1 tablet (5 mg total) by mouth 2 (two) times daily. 11/11/23   Bevely Doffing, FNP  carvedilol  (COREG ) 6.25 MG tablet TAKE ONE TABLET (6.25MG  TOTAL) BY MOUTH TWO TIMES DAILY WITH A MEAL 10/13/23   Court Dorn PARAS, MD  Cholecalciferol  (VITAMIN D -3 PO) Take 2,000 Units by mouth daily.    [provider]  clopidogrel  (PLAVIX ) 75 MG tablet Take 1 tablet (75 mg total) by mouth daily. 09/24/23   Court Dorn PARAS, MD  Cyanocobalamin  (B-12 PO) Take by mouth.    [provider]  dapagliflozin  propanediol (FARXIGA ) 10 MG TABS tablet Take 1 tablet (10 mg total) by mouth daily. 11/03/23   Court Dorn PARAS, MD  Evolocumab  (REPATHA  SURECLICK) 140 MG/ML SOAJ Inject 140 mg into the skin every 14 (fourteen) days. 05/07/23   Rolan Ezra RAMAN, MD  glucose blood (ACCU-CHEK GUIDE TEST) test strip USE TO TEST BLOOD SUGAR IN THE MORNING, AT NOON, AND AT BEDTIME. 07/04/23   Dixon, Phillip E, MD  guaiFENesin (MUCINEX PO) Take 1,200 mg by  mouth 2 (two) times daily.    [provider]  levocetirizine (XYZAL ) 5 MG tablet Take 5 mg by mouth every evening.    [provider]  LORazepam  (ATIVAN ) 0.5 MG tablet Take 1 tablet (0.5 mg total) by mouth daily as needed for anxiety. 11/11/23   Bevely Doffing, FNP  mometasone  (NASONEX ) 50 MCG/ACT nasal spray Place 1 spray into the nose daily. 12/08/19   Iva Marty Saltness, MD  nitroGLYCERIN  (NITROSTAT ) 0.4 MG SL tablet Place 1 tablet (0.4 mg total) under the tongue every 5 (five) minutes as needed for chest pain. 11/11/23   Bevely Doffing, FNP  Omega-3 Fatty Acids (FISH OIL PO) Take by mouth.    [provider]  pantoprazole  (PROTONIX ) 40 MG tablet Take 1 tablet (40 mg total) by mouth daily. 11/11/23   Bevely Doffing, FNP  sacubitril -valsartan  (ENTRESTO ) 24-26 MG Take 1 tablet by mouth 2 (two) times daily. 01/05/24  Court Dorn PARAS, MD  spironolactone  (ALDACTONE ) 25 MG tablet TAKE ONE TABLET (25 MG TOTAL) BY MOUTH AT BEDTIME. 11/27/23   Court Dorn PARAS, MD  tadalafil  (CIALIS ) 20 MG tablet TAKE 1 TABLET BY MOUTH ONCE DAILY AS NEEDED 01/05/24   McKenzie, Belvie CROME, MD  tamsulosin  (FLOMAX ) 0.4 MG CAPS capsule TAKE ONE (1) TABLET BY MOUTH EVERY DAY AFTER SUPPER 10/29/23   McKenzie, Belvie CROME, MD  testosterone  cypionate (DEPOTESTOSTERONE CYPIONATE) 200 MG/ML injection Inject 0.5 mLs (100 mg total) into the muscle every 7 (seven) days. 10/29/23   McKenzie, Belvie CROME, MD  traZODone  (DESYREL ) 100 MG tablet Take 1 tablet (100 mg total) by mouth at bedtime. 11/11/23   Bevely Doffing, FNP  triamcinolone  cream (KENALOG ) 0.1 % Apply 1 Application topically daily as needed (irritation/rash).    [provider]    Family History Family History  Problem Relation Age of Onset   Stroke Mother    Alcohol abuse Father    Heart disease Father    Allergic rhinitis Brother    Alcohol abuse Brother    Anxiety disorder Brother    Depression Brother    Depression Brother    Drug  abuse Brother    Stroke Brother    Dementia Brother    Anxiety disorder Brother    Heart disease Paternal Uncle    Heart disease Paternal Uncle    Colon cancer Neg Hx    Pancreatic cancer Neg Hx    Rectal cancer Neg Hx    Stomach cancer Neg Hx     Social History Social History   Tobacco Use   Smoking status: Former    Current packs/day: 0.00    Average packs/day: 1.5 packs/day for 20.0 years (30.0 ttl pk-yrs)    Types: Cigarettes    Start date: 72    Quit date: 02/04/2013    Years since quitting: 10.9   Smokeless tobacco: Never  Vaping Use   Vaping status: Never Used  Substance Use Topics   Alcohol use: Not Currently   Drug use: No     Allergies   Patient has no known allergies.   Review of Systems Review of Systems Per HPI  Physical Exam Triage Vital Signs ED Triage Vitals [01/08/24 0953]  Encounter Vitals Group     BP      Girls Systolic BP Percentile      Girls Diastolic BP Percentile      Boys Systolic BP Percentile      Boys Diastolic BP Percentile      Pulse Rate 86     Resp 18     Temp 98.2 F (36.8 C)     Temp Source Oral     SpO2 93 %     Weight      Height      Head Circumference      Peak Flow      Pain Score 0     Pain Loc      Pain Education      Exclude from Growth Chart    No data found.  Updated Vital Signs Pulse 86   Temp 98.2 F (36.8 C) (Oral)   Resp 18   SpO2 93%   BP: 111/78  Visual Acuity Right Eye Distance:   Left Eye Distance:   Bilateral Distance:    Right Eye Near:   Left Eye Near:    Bilateral Near:     Physical Exam Vitals and nursing note reviewed.  Constitutional:  General: He is not in acute distress.    Appearance: Normal appearance. He is not ill-appearing or toxic-appearing.  HENT:     Head: Normocephalic and atraumatic.     Right Ear: Tympanic membrane, ear canal and external ear normal.     Left Ear: Tympanic membrane, ear canal and external ear normal.     Nose: Congestion present. No  rhinorrhea.     Mouth/Throat:     Mouth: Mucous membranes are moist.     Pharynx: Oropharynx is clear. Postnasal drip present. No oropharyngeal exudate or posterior oropharyngeal erythema.  Eyes:     General: No scleral icterus.    Extraocular Movements: Extraocular movements intact.  Cardiovascular:     Rate and Rhythm: Normal rate and regular rhythm.  Pulmonary:     Effort: Pulmonary effort is normal. No respiratory distress.     Breath sounds: Normal breath sounds. No wheezing, rhonchi or rales.  Musculoskeletal:     Cervical back: Normal range of motion and neck supple.  Lymphadenopathy:     Cervical: No cervical adenopathy.  Skin:    General: Skin is warm and dry.     Coloration: Skin is not jaundiced or pale.     Findings: No erythema or rash.  Neurological:     Mental Status: He is alert and oriented to person, place, and time.  Psychiatric:        Behavior: Behavior is cooperative.      UC Treatments / Results  Labs (all labs ordered are listed, but only abnormal results are displayed) Labs Reviewed - No data to display  EKG   Radiology No results found.  Procedures Procedures (including critical care time)  Medications Ordered in UC Medications - No data to display  Initial Impression / Assessment and Plan / UC Course  I have reviewed the triage vital signs and the nursing notes.  Pertinent labs & imaging results that were available during my care of the patient were reviewed by me and considered in my medical decision making (see chart for details).   Patient is a very pleasant, well-appearing 67 year old male presenting today for viral symptoms.  Vital signs are normal today.  On exam, he has nasal congestion and postnasal drainage at the posterior pharynx.  Viral testing was deferred given length of symptoms and patient declines.  Supportive care was discussed with patient, continue guaifenesin and start cough suppressant medication.  ER and return  precautions discussed.  The patient was given the opportunity to ask questions.  All questions answered to their satisfaction.  The patient is in agreement to this plan.   Final Clinical Impressions(s) / UC Diagnoses   Final diagnoses:  Viral URI with cough     Discharge Instructions      You have a viral upper respiratory infection.  Symptoms should improve over the next week to 10 days.  If you develop chest pain or shortness of breath, go to the emergency room.  Some things that can make you feel better are: - Increased rest - Increasing fluid with water/sugar free electrolytes - Acetaminophen  as needed for fever/pain - Salt water gargling, chloraseptic spray and throat lozenges for sore throat - OTC guaifenesin (Mucinex) 1200 mg twice daily for congestion - Saline sinus flushes or a neti pot - Humidifying the air     ED Prescriptions     Medication Sig Dispense Auth. Provider   benzonatate  (TESSALON ) 200 MG capsule Take 1 capsule (200 mg total) by mouth 2 (two)  times daily as needed for cough. Do not take with alcohol or while driving or operating heavy machinery.  May cause drowsiness. 30 capsule Chandra Harlene LABOR, NP      PDMP not reviewed this encounter.   Chandra Harlene LABOR, NP 01/08/24 1155

## 2024-01-08 NOTE — ED Triage Notes (Signed)
 Pt reports cough, congestion  x 4 days, mild body aches. States he has taken home COVID test and it was negative.

## 2024-01-08 NOTE — ED Notes (Signed)
 Pt declined viral testing at this time.

## 2024-01-21 ENCOUNTER — Ambulatory Visit (INDEPENDENT_AMBULATORY_CARE_PROVIDER_SITE_OTHER): Admitting: Psychiatry

## 2024-01-21 DIAGNOSIS — F419 Anxiety disorder, unspecified: Secondary | ICD-10-CM | POA: Diagnosis not present

## 2024-01-21 NOTE — Progress Notes (Signed)
 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 Pt reports two psychiatric hospitalizations due to depression. Last one occured 1999 in [place] . Pt began participating in outpatient therapy intermittenly in the nineties, has not participated in therapy since that time. Pt reports long standing hx of attending  AA meetings and currently looking for a local group in his area.   Mental Health Symptoms Depression:  Difficulty Concentrating; Fatigue; Irritability   Duration of Depressive symptoms: No data recorded  Mania:  None   Anxiety:   Difficulty concentrating; Fatigue; Irritability; Restlessness   Psychosis:  None   Duration of Psychotic symptoms: No data recorded  Trauma:  None   Obsessions:  None   Compulsions:  None   Inattention:  None   Hyperactivity/Impulsivity:  None   Oppositional/Defiant Behaviors:  None   Emotional Irregularity:  None   Other Mood/Personality Symptoms:  No data recorded   Mental Status Exam Appearance and self-care  Stature:  Average   Weight:  Average weight   Clothing:  Casual   Grooming:  Normal   Cosmetic use:  None   Posture/gait:  Normal   Motor  activity:  Not Remarkable   Sensorium  Attention:  Normal   Concentration:  Normal   Orientation:  X5   Recall/memory:  Normal   Affect and Mood  Affect:  Appropriate   Mood:  Euthymic   Relating  Eye contact:  Normal   Facial expression:  Responsive   Attitude toward examiner:  Cooperative   Thought and Language  Speech flow: Normal   Thought content:  Appropriate to Mood and Circumstances   Preoccupation:  None   Hallucinations:  None   Organization:  No data recorded  [employer] of Knowledge:  Good   Intelligence:  Above Average   Abstraction:  Normal   Judgement:  Good   Reality Testing:  Realistic   Insight:  Good   Decision Making:  Normal   Social Functioning  Social Maturity:  Responsible   Social Judgement:  Normal   Stress  Stressors:  Transitions; Work   Coping Ability:  Human Resources Officer Deficits:  None   Supports:  Family; Friends/Service system     Religion: Religion/Spirituality Are You A Religious Person?: Yes What is Your Religious Affiliation?: Chiropodist: Leisure / Recreation Do You Have Hobbies?: Yes Leisure and Hobbies: reading, traveling,  Exercise/Diet: Exercise/Diet Do You Exercise?: Yes What Type of Exercise Do You Do?: Run/Walk How Many Times a Week Do You Exercise?: 1-3 times a week Have You Gained or Lost A Significant Amount of Weight in the Past Six Months?: No Do You Follow a Special Diet?: No Do You Have Any Trouble Sleeping?: No Explanation of Sleeping Difficulties: trazodone  as sleep aid helps   CCA Employment/Education Employment/Work Situation: Employment / Work Situation Employment Situation: Employed Where is  Patient 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?:  No  Education: Education Did Garment/textile Technologist From [employer]?: Yes Did Theme Park Manager?: Yes What Type of College Degree Do you Have?: undergraduate degree from [employer], social worker degree from [employer] Did You Have An Individualized Education Program (IIEP): No Did You Have Any Difficulty At [employer]?: No Patient's Education Has Been Impacted by Current Illness: No   CCA Family/Childhood History Family and Relationship History: Family history Marital status: Married (Pt and his wife have been married for 11 years but have been together for 15 years.  Pt and his wife along with their adopted 45 yo daughter reside in [place].) Number of Years Married: 11 What types of issues is patient dealing with in the relationship?: get along well Are you sexually active?: Yes Does patient have children?: Yes How many children?: 7 (two biologicial sons ages, 73 and  69, adopted daughters ages 19 and  28, two stepdaughters ages 25 and 58, adopted son age 62) How is patient's relationship with their children?: good relationship  Childhood History:  Childhood History By whom was/is the patient raised?: Both parents Additional childhood history information: Pt was born in [place] and reared in [place]  until family moved to [place] when pt was 68 years old Description of patient's relationship with caregiver when they were a child: father became distant, problems with alcoholism, didn't have time for family/ good relationship with mother who took care of everyone Patient's description of current relationship with people who raised him/her: deceased How were you disciplined when you got in trouble as a child/adolescent?: car keys taken away, probably spankings when younger Does patient have siblings?: Yes Number of Siblings: 3 Description of patient's current relationship with siblings: one is deceased, okay relationship with brother/sister but distant Did patient suffer any  verbal/emotional/physical/sexual abuse as a child?: No Did patient suffer from severe childhood neglect?: No Has patient ever been sexually abused/assaulted/raped as an adolescent or adult?: No Was the patient ever a victim of a crime or a disaster?: No Witnessed domestic violence?: No Has patient been affected by domestic violence as an adult?: No  Child/Adolescent Assessment: N/A     CCA Substance Use Alcohol/Drug Use: Alcohol / Drug Use Pain Medications: see patient record Prescriptions: see patient record Over the Counter: see patient record History of alcohol / drug use?: Yes (hx of alcohol abuse/dependence for 15 years, last used in October 1987,)   ASAM's:  Six Dimensions of Multidimensional Assessment  Dimension 1:  Acute Intoxication and/or Withdrawal Potential:      Dimension 2:  Biomedical Conditions and Complications:      Dimension 3:  Emotional, Behavioral, or Cognitive Conditions and Complications:    Dimension 4:  Readiness to Change:    Dimension 5:  Relapse, Continued use, or Continued Problem Potential:     Dimension 6:  Recovery/Living Environment:    ASAM Severity Score:    ASAM Recommended Level of Treatment:     Substance use Disorder (SUD)    Recommendations for Services/Supports/Treatments: Recommendations for Services/Supports/Treatments Recommendations For Services/Supports/Treatments: Individual Therapy, Medication Management/patient attends the assessment appointment today.  Confidentiality and limits were discussed.  Nutritional assessment, pain assessment, PHQ 2, C-S SRS, and GAD-7 administered.  General therapy is recommended at 1 time every 1 to 4 weeks to improve coping skills to  manage stress and anxiety.  Patient agrees to return for.  Will continue to see PCP for medication management.  DSM5 Diagnoses: Patient Active Problem List   Diagnosis Date Noted   Chronic systolic heart failure (HCC) 11/13/2023   Tinea cruris 05/12/2023    Ischemic cardiomyopathy 11/11/2022   Colon cancer screening 11/11/2022   Need for influenza vaccination 11/11/2022   Type 2 diabetes mellitus with diabetic chronic kidney disease (HCC) 06/10/2022   CAD (coronary artery disease) 05/07/2022   Attention deficit 05/07/2022   Anxiety and depression 05/07/2022   Insomnia 05/07/2022   GERD (gastroesophageal reflux disease) 05/07/2022   BPH (benign prostatic hyperplasia) 05/07/2022   OSA on CPAP 05/07/2022   CKD (chronic kidney disease) stage 2, GFR 60-89 ml/min 05/07/2022   History of hypogonadism 05/07/2022   Encounter for general adult medical examination with abnormal findings 05/07/2022   Left ventricular dysfunction 04/02/2022   Depression 03/19/2022   Behavior-irritability 04/18/2021   Hyperlipemia 01/10/2021   NSTEMI (non-ST elevated myocardial infarction) (HCC) 10/17/2020   Seasonal and perennial allergic rhinitis 12/08/2019   Cognitive attention deficit 04/19/2019   Memory loss, short term 02/18/2019   Fall 02/18/2019   Eye movement abnormality 02/18/2019    Patient Centered Plan: Patient is on the following Treatment Plan(s): Will be developed next session   Referrals to Alternative Service(s): Referred to Alternative Service(s):   Place:   Date:   Time:    Referred to Alternative Service(s):   Place:   Date:   Time:    Referred to Alternative Service(s):   Place:   Date:   Time:    Referred to Alternative Service(s):   Place:   Date:   Time:      Collaboration of Care: Primary Care Provider AEB patient sees PCP for medication management  Patient/Guardian was advised Release of Information must be obtained prior to any record release in order to collaborate their care with an outside provider. Patient/Guardian was advised if they have not already done so to contact the registration department to sign all necessary forms in order for us  to release information regarding their care.   Consent: Patient/Guardian gives verbal  consent for treatment and assignment of benefits for services provided during this visit. Patient/Guardian expressed understanding and agreed to proceed.   Junior Huezo E Rhett Mutschler, LCSW

## 2024-02-03 ENCOUNTER — Ambulatory Visit (HOSPITAL_COMMUNITY): Admitting: Psychiatry

## 2024-02-03 DIAGNOSIS — F419 Anxiety disorder, unspecified: Secondary | ICD-10-CM

## 2024-02-03 NOTE — Progress Notes (Unsigned)
 Virtual Visit via Video Note  I connected with Justin Carroll on 02/03/2024 at 4:10 PM EST  by a video enabled telemedicine application and verified that I am speaking with the correct person using two identifiers.  Location: Patient: Home Provider: [facility] [place] [facility]   I discussed the limitations of evaluation and management by telemedicine and the availability of in person appointments. The patient expressed understanding and agreed to proceed.    I provided 45 minutes of non-face-to-face time during this encounter.   Winton FORBES Rubinstein, LCSW    THERAPIST PROGRESS NOTE  Session Time: Tuesday 02/03/2024 4:10 PM - 4:55 PM   Participation Level: Active  Behavioral Response: CasualAlertAnxious  Type of Therapy: Individual Therapy  Treatment Goals addressed: Establish therapeutic alliance, improve self-care  ProgressTowards Goals: Formal treatment plan will be developed next session  Interventions: CBT and Supportive  Summary: Justin Carroll is a 68 y.o. male who  is referred for services by PCP. Pt reports two psychiatric hospitalizations due to depression. Last one occured 1999 in [place] . Pt began participating in outpatient therapy intermittenly in the nineties, has not participated in therapy since that time. Pt reports long standing hx of attending AA meetings and currently looking for a local group in his area.  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  He also reports some challenges with parenting responsibilities as a 71 year old child.  Patient reports difficulty concentrating, fatigue, irritability, and restlessness.  Patient last was seen 1 to 2 weeks ago for the assessment appointment.  He reports continued stress and feeling overwhelmed regarding trying to achieve work/life balance.  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  He reports a pattern of over committing.  He is committed to his profession and states seeing his role as a calling.  He reports often taking cases others will not take. He has started trying to streamline some processes and is beginning to try to establish more strict criteria for the type of cases he now will accept.  He also reports stress from being chronically behind on projects.  He has started trying to improve time management by setting aside blocks of time and doing work in a designated space. Patient has several health conditions that are stable per his report. He expresses desire to focus more on improving self-care to improve his health but has  not been able to dedicate time to do this. He recently found a gym close to home and is planning to try to start attending in January. Pt also recently found an AA group and plans to start attending this as well.   Suicidal/Homicidal: Nowithout intent/plan  Therapist Response: Reviewed symptoms, gathered more information from patient, discussed stressors, facilitated expression of thoughts and feelings, validated feelings, began to assist patient examine his patterns regarding work/life balance, began to explore possible goals for treatment, praised and reinforced patient's efforts in finding a gym and AA group, discussed and encouraged patient to follow through with his plan to attend the gym and do cardio 2 times per week for 20 to 30 minutes based on his capability, assisted patient identify and address thoughts and processes that may inhibit implementation of plan  Plan: Return again in 2 weeks.  Diagnosis: Anxiety  Collaboration of Care: Primary Care Provider AEB patient sees PCP for medication management  Patient/Guardian was advised Release of Information must be obtained prior  to any record release in order to collaborate their care with an outside provider. Patient/Guardian was advised if they have not already done so to contact the  registration department to sign all necessary forms in order for us  to release information regarding their care.   Consent: Patient/Guardian gives verbal consent for treatment and assignment of benefits for services provided during this visit. Patient/Guardian expressed understanding and agreed to proceed.   Winton FORBES Rubinstein, LCSW 02/03/2024

## 2024-02-11 ENCOUNTER — Encounter (HOSPITAL_COMMUNITY): Payer: Self-pay

## 2024-02-11 ENCOUNTER — Other Ambulatory Visit (HOSPITAL_COMMUNITY): Payer: Self-pay

## 2024-02-11 ENCOUNTER — Telehealth (HOSPITAL_COMMUNITY): Payer: Self-pay

## 2024-02-11 NOTE — Telephone Encounter (Signed)
 Advanced Heart Failure Patient Advocate Encounter  The patient was approved for a [employer] grant that will help cover the cost of Carvedilol , Entresto , Farxiga , Spironolactone .  Total amount awarded, $7,500.  Effective: 01/12/2024 - 01/10/2025.  BIN [id number] PCN [id number] [facility] [id number] ID [id number]  Patient informed via phone and email.  Rachel DEL, CPhT Rx Patient Advocate Phone: [phone]

## 2024-02-11 NOTE — Telephone Encounter (Signed)
"  error  "

## 2024-02-20 ENCOUNTER — Ambulatory Visit (HOSPITAL_COMMUNITY): Admitting: Psychiatry

## 2024-02-20 DIAGNOSIS — F419 Anxiety disorder, unspecified: Secondary | ICD-10-CM

## 2024-02-20 NOTE — Progress Notes (Signed)
 Virtual Visit via Video Note  I connected with Justin Carroll on 02/20/24 at 10:04 AMPM EST  by a video enabled telemedicine application and verified that I am speaking with the correct person using two identifiers.  Location: Patient: Home Provider: Home Office   I discussed the limitations of evaluation and management by telemedicine and the availability of in person appointments. The patient expressed understanding and agreed to proceed.    I provided 60 minutes of non-face-to-face time during this encounter.   Winton FORBES Rubinstein, LCSW    THERAPIST PROGRESS NOTE  Session Time: Friday 02/20/2024 10:05 AM - 11:05 AM  Participation Level: Active  Behavioral Response: CasualAlertAnxious/tangentiality   Type of Therapy: Individual Therapy  Treatment Goals addressed: Establish therapeutic alliance, improve self-care  ProgressTowards Goals: Formal treatment plan will be developed next session  Interventions: CBT and Supportive  Summary: Justin Carroll is a 68 y.o. male who  is referred for services by PCP. Pt reports two psychiatric hospitalizations due to depression. Last one occured 1999 in [place] . Pt began participating in outpatient therapy intermittenly in the nineties, has not participated in therapy since that time. Pt reports long standing hx of attending AA meetings and currently looking for a local group in his area.  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  He also reports some challenges with parenting responsibilities as a 25 year old child.  Patient reports difficulty concentrating, fatigue, irritability, and restlessness.  Patient last was seen 2  weeks ago. He expresses relief holidays are over. He reports trying to get back into a routine. He reports making changes regarding morning routine has been helpful as he has worked with 70 yo daughter to  improved their morning schedule. 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 He continues his efforts to focus on certain types of cases and refer out the other cases.  He has contacted the gym staff near his home but hasn't started attending. He also has not began to attend AA group.  He reports things like family events keep getting in the way. He still reports feeling overwhelmed with multiple responsibilities but expresses hope improving his routines will help.  Suicidal/Homicidal: Nowithout intent/plan  Therapist Response: Reviewed symptoms, gathered more information from patient, discussed stressors, facilitated expression of thoughts and feelings, validated feelings, praised and reinforced patient's efforts to improve morning routine at home/make changes regarding work routine-schedule, discussed effects, assisted patient began to identify thoughts and processes that inhibited implementation of plan to attend gym, developed plan with patient to attend the gym 2 times per week , will continue to work identifying and prioritizing goals next session Plan: Return again in 2 weeks.  Diagnosis: Anxiety  Collaboration of Care: Primary Care Provider AEB patient sees PCP for medication management  Patient/Guardian was advised Release of Information must be obtained prior to any record release in order to collaborate their care with an outside provider. Patient/Guardian was advised if they have not already done so to contact the registration department to sign all necessary forms in order for us  to release information regarding their care.   Consent: Patient/Guardian gives verbal consent for treatment and assignment of benefits for services provided during this visit. Patient/Guardian expressed understanding and agreed to proceed.   Winton FORBES Rubinstein, LCSW 02/20/2024

## 2024-03-01 ENCOUNTER — Ambulatory Visit (HOSPITAL_COMMUNITY): Admitting: Psychiatry

## 2024-03-01 DIAGNOSIS — F419 Anxiety disorder, unspecified: Secondary | ICD-10-CM

## 2024-03-01 NOTE — Progress Notes (Signed)
 Virtual Visit via Video Note  I connected with Justin Carroll on 03/01/24 at 9:06 AM AMPM EST  by a video enabled telemedicine application and verified that I am speaking with the correct person using two identifiers.  Location: Patient: Home Provider: Home Office   I discussed the limitations of evaluation and management by telemedicine and the availability of in person appointments. The patient expressed understanding and agreed to proceed.    I provided 49 minutes of non-face-to-face time during this encounter.   Winton FORBES Rubinstein, LCSW    THERAPIST PROGRESS NOTE  Session Time: Monday 1/26//2026 9:06 AM  - 9:55 AM  Participation Level: Active  Behavioral Response: CasualAlertAnxious/tangentiality   Type of Therapy: Individual Therapy  Treatment Goals addressed: Establish therapeutic alliance, improve self-care  ProgressTowards Goals: Formal treatment plan will be developed next session  Interventions: CBT and Supportive  Summary: Justin Carroll is a 69 y.o. male who  is referred for services by PCP. Pt reports two psychiatric hospitalizations due to depression. Last one occured 1999 in [place] . Pt began participating in outpatient therapy intermittenly in the nineties, has not participated in therapy since that time. Pt reports long standing hx of attending AA meetings and currently looking for a local group in his area.  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  He also reports some challenges with parenting responsibilities as a 49 year old child.  Patient reports difficulty concentrating, fatigue, irritability, and restlessness.  Patient last was seen 2  weeks ago. He reports feeling less overwhelmed as he continues to make changes regarding his work schedule and case priorities.  He also continues to make improvement and changes regarding his morning  routine at home along with having conversations with family regarding help and sharing responsibilities.  Patient also is pleased he attended the gym twice since last session.  He also reports involvement in some pleasurable activities such as socializing with friends over dinner.  He continues to try to schedule time for self to do pleasure reading.  Patient expresses desire to continue to improve his skills and try to achieve work/life balance and adjusting to this stage in his life.  Suicidal/Homicidal: Nowithout intent/plan   Therapist Response: Reviewed symptoms, praised and reinforced patient's efforts to implement plan of going to the gym, discussed effects, encouraged patient to continue consistent efforts, praised and reinforced patient's continued efforts to implement changes in work schedule/case priorities, improve morning routine, discussed effects of changes, began to identify other areas in life where he would like to make adjustments, will discuss more next session, discussed rationale for and develop plan with patient to complete therapy goals worksheet in preparation for next session, sent patient handout via email   Plan: Return again in 2 weeks.    Plan: Return again in 2 weeks.  Diagnosis: Anxiety  Collaboration of Care: Primary Care Provider AEB patient sees PCP for medication management  Patient/Guardian was advised Release of Information must be obtained prior to any record release in order to collaborate their care with an outside provider. Patient/Guardian was advised if they have not already done so to contact the registration department to sign all necessary forms in order for us  to release information regarding their care.   Consent: Patient/Guardian gives verbal consent for treatment and assignment of benefits for services provided during this visit. Patient/Guardian expressed understanding and agreed to proceed.   Justin Desha E Krina Mraz,  LCSW 03/01/2024

## 2024-03-19 ENCOUNTER — Ambulatory Visit (HOSPITAL_COMMUNITY): Admitting: Psychiatry

## 2024-04-22 ENCOUNTER — Other Ambulatory Visit

## 2024-04-28 ENCOUNTER — Ambulatory Visit: Admitting: Urology

## 2024-05-11 ENCOUNTER — Ambulatory Visit

## 2024-06-07 ENCOUNTER — Ambulatory Visit: Admitting: Neurology

## 2024-12-24 ENCOUNTER — Ambulatory Visit
